# Patient Record
Sex: Female | Born: 1955 | Race: Black or African American | Hispanic: No | Smoking: Never smoker
Health system: Southern US, Community
[De-identification: ages and names within clinical notes are randomized; demographics above are authoritative.]

## PROBLEM LIST (undated history)

## (undated) DIAGNOSIS — E7800 Pure hypercholesterolemia, unspecified: Secondary | ICD-10-CM

## (undated) DIAGNOSIS — F32A Depression, unspecified: Secondary | ICD-10-CM

## (undated) DIAGNOSIS — G479 Sleep disorder, unspecified: Secondary | ICD-10-CM

## (undated) DIAGNOSIS — C50919 Malignant neoplasm of unspecified site of unspecified female breast: Secondary | ICD-10-CM

## (undated) DIAGNOSIS — F419 Anxiety disorder, unspecified: Secondary | ICD-10-CM

## (undated) DIAGNOSIS — I10 Essential (primary) hypertension: Secondary | ICD-10-CM

## (undated) DIAGNOSIS — IMO0002 Reserved for concepts with insufficient information to code with codable children: Secondary | ICD-10-CM

## (undated) DIAGNOSIS — IMO0001 Reserved for inherently not codable concepts without codable children: Secondary | ICD-10-CM

## (undated) DIAGNOSIS — M1990 Unspecified osteoarthritis, unspecified site: Secondary | ICD-10-CM

## (undated) DIAGNOSIS — F329 Major depressive disorder, single episode, unspecified: Secondary | ICD-10-CM

## (undated) DIAGNOSIS — K589 Irritable bowel syndrome without diarrhea: Secondary | ICD-10-CM

## (undated) HISTORY — DX: Pure hypercholesterolemia, unspecified: E78.00

## (undated) HISTORY — PX: DILATION AND CURETTAGE OF UTERUS: SHX78

## (undated) HISTORY — PX: COLONOSCOPY: SHX174

## (undated) HISTORY — DX: Irritable bowel syndrome, unspecified: K58.9

## (undated) HISTORY — DX: Reserved for inherently not codable concepts without codable children: IMO0001

## (undated) HISTORY — DX: Essential (primary) hypertension: I10

## (undated) HISTORY — PX: TONSILLECTOMY: SUR1361

## (undated) HISTORY — DX: Reserved for concepts with insufficient information to code with codable children: IMO0002

## (undated) HISTORY — PX: CYST EXCISION: SHX5701

---

## 1997-11-10 ENCOUNTER — Other Ambulatory Visit: Admission: RE | Admit: 1997-11-10 | Discharge: 1997-11-10 | Payer: Self-pay | Admitting: Obstetrics and Gynecology

## 1998-11-04 ENCOUNTER — Other Ambulatory Visit: Admission: RE | Admit: 1998-11-04 | Discharge: 1998-11-04 | Payer: Self-pay | Admitting: Obstetrics and Gynecology

## 1998-12-22 ENCOUNTER — Encounter (INDEPENDENT_AMBULATORY_CARE_PROVIDER_SITE_OTHER): Payer: Self-pay | Admitting: Specialist

## 1998-12-22 ENCOUNTER — Other Ambulatory Visit: Admission: RE | Admit: 1998-12-22 | Discharge: 1998-12-22 | Payer: Self-pay | Admitting: Obstetrics and Gynecology

## 1999-02-08 ENCOUNTER — Ambulatory Visit (HOSPITAL_COMMUNITY): Admission: RE | Admit: 1999-02-08 | Discharge: 1999-02-08 | Payer: Self-pay | Admitting: Internal Medicine

## 1999-02-08 ENCOUNTER — Encounter: Payer: Self-pay | Admitting: Internal Medicine

## 1999-06-24 ENCOUNTER — Encounter (INDEPENDENT_AMBULATORY_CARE_PROVIDER_SITE_OTHER): Payer: Self-pay | Admitting: *Deleted

## 1999-06-24 ENCOUNTER — Ambulatory Visit (HOSPITAL_BASED_OUTPATIENT_CLINIC_OR_DEPARTMENT_OTHER): Admission: RE | Admit: 1999-06-24 | Discharge: 1999-06-24 | Payer: Self-pay | Admitting: General Surgery

## 2000-01-26 ENCOUNTER — Other Ambulatory Visit: Admission: RE | Admit: 2000-01-26 | Discharge: 2000-01-26 | Payer: Self-pay | Admitting: Obstetrics and Gynecology

## 2001-04-16 ENCOUNTER — Other Ambulatory Visit: Admission: RE | Admit: 2001-04-16 | Discharge: 2001-04-16 | Payer: Self-pay | Admitting: Obstetrics and Gynecology

## 2003-05-18 ENCOUNTER — Other Ambulatory Visit: Admission: RE | Admit: 2003-05-18 | Discharge: 2003-05-18 | Payer: Self-pay | Admitting: Obstetrics and Gynecology

## 2006-03-27 ENCOUNTER — Encounter: Admission: RE | Admit: 2006-03-27 | Discharge: 2006-03-27 | Payer: Self-pay | Admitting: Orthopedic Surgery

## 2009-01-22 ENCOUNTER — Ambulatory Visit: Payer: Self-pay | Admitting: Internal Medicine

## 2009-01-22 DIAGNOSIS — R142 Eructation: Secondary | ICD-10-CM

## 2009-01-22 DIAGNOSIS — R141 Gas pain: Secondary | ICD-10-CM | POA: Insufficient documentation

## 2009-01-22 DIAGNOSIS — R143 Flatulence: Secondary | ICD-10-CM

## 2009-01-22 DIAGNOSIS — K5900 Constipation, unspecified: Secondary | ICD-10-CM | POA: Insufficient documentation

## 2009-02-04 ENCOUNTER — Telehealth: Payer: Self-pay | Admitting: Internal Medicine

## 2009-02-15 ENCOUNTER — Telehealth: Payer: Self-pay | Admitting: Internal Medicine

## 2009-02-16 ENCOUNTER — Ambulatory Visit: Payer: Self-pay | Admitting: Internal Medicine

## 2009-06-17 ENCOUNTER — Inpatient Hospital Stay (HOSPITAL_COMMUNITY): Admission: EM | Admit: 2009-06-17 | Discharge: 2009-06-20 | Payer: Self-pay | Admitting: Emergency Medicine

## 2009-09-22 ENCOUNTER — Encounter: Admission: RE | Admit: 2009-09-22 | Discharge: 2009-09-22 | Payer: Self-pay | Admitting: Family Medicine

## 2010-08-15 LAB — CATECHOLAMINES, FRACTIONATED, URINE, 24 HOUR
Catecholamines T: 65 mcg/24 h (ref 26–121)
Creatinine, Urine mg/day-CATEUR: 0.98 g/(24.h) (ref 0.63–2.50)
Norepinephrine 24 Hr Urine: 41 mcg/24 h (ref 15–100)
Total urine volume: 3000 mL

## 2010-08-15 LAB — DIFFERENTIAL
Eosinophils Relative: 2 % (ref 0–5)
Lymphocytes Relative: 22 % (ref 12–46)
Lymphs Abs: 1.5 10*3/uL (ref 0.7–4.0)
Neutrophils Relative %: 68 % (ref 43–77)

## 2010-08-15 LAB — URINALYSIS, ROUTINE W REFLEX MICROSCOPIC
Ketones, ur: NEGATIVE mg/dL
Nitrite: NEGATIVE
Specific Gravity, Urine: 1.015 (ref 1.005–1.030)
pH: 7.5 (ref 5.0–8.0)

## 2010-08-15 LAB — POCT CARDIAC MARKERS
Myoglobin, poc: 72.7 ng/mL (ref 12–200)
Troponin i, poc: <0.05 ng/mL (ref 0.00–0.09)

## 2010-08-15 LAB — VMA + CREATININE, URINE (TIMED COLLECTION)
Vanillylmandelic Acid, (VMA): 2.4 mg/24 h (ref <=6.0)
Volume, Urine-VMAUR: 3000 mL

## 2010-08-15 LAB — CBC
HCT: 40 % (ref 36.0–46.0)
Platelets: 381 10*3/uL (ref 150–400)
WBC: 6.8 10*3/uL (ref 4.0–10.5)

## 2010-08-15 LAB — BASIC METABOLIC PANEL
BUN: 12 mg/dL (ref 6–23)
BUN: 16 mg/dL (ref 6–23)
BUN: 8 mg/dL (ref 6–23)
CO2: 26 mEq/L (ref 19–32)
CO2: 29 mEq/L (ref 19–32)
Calcium: 9.3 mg/dL (ref 8.4–10.5)
Chloride: 102 mEq/L (ref 96–112)
Chloride: 102 mEq/L (ref 96–112)
Chloride: 104 mEq/L (ref 96–112)
Creatinine, Ser: 0.67 mg/dL (ref 0.4–1.2)
Creatinine, Ser: 0.74 mg/dL (ref 0.4–1.2)
GFR calc Af Amer: >60 mL/min (ref >60)
GFR calc non Af Amer: >60 mL/min (ref >60)
Glucose, Bld: 91 mg/dL (ref 70–99)
Potassium: 3.3 mEq/L — ABNORMAL LOW (ref 3.5–5.1)
Potassium: 3.6 mEq/L (ref 3.5–5.1)
Potassium: 3.7 mEq/L (ref 3.5–5.1)

## 2010-08-15 LAB — CK TOTAL AND CKMB (NOT AT ARMC)
CK, MB: 2 ng/mL (ref 0.3–4.0)
Total CK: 92 U/L (ref 7–177)

## 2010-08-15 LAB — ALDOSTERONE + RENIN ACTIVITY W/ RATIO: PRA LC/MS/MS: 0.05 ng/mL/h — ABNORMAL LOW (ref 0.25–5.82)

## 2010-08-15 LAB — TROPONIN I
Troponin I: 0.01 ng/mL (ref 0.00–0.06)
Troponin I: 0.03 ng/mL (ref 0.00–0.06)

## 2010-08-15 LAB — POCT PREGNANCY, URINE: Preg Test, Ur: NEGATIVE

## 2010-10-14 NOTE — Op Note (Signed)
[place] [facility]  Patient:    Mckenzie Sanford                       MRN: [id number] Proc. Date: 06/24/99 Adm. Date:  [id number] Attending:  Cherylynn Ridges                           Operative Report  PREOPERATIVE DIAGNOSIS:  Hidradenitis of right axilla.  POSTOPERATIVE DIAGNOSIS:  Hidradenitis of right axilla.  PROCEDURE:  Excision of right axillary hidradenitis.  SURGEON:  Jimmye Norman, M.D.  ASSISTANT:  None.  ANESTHESIA:  Monitored anesthesia care with 0.5% Xylocaine and 0.25% Marcaine mixture.  COMPLICATIONS:  None.  CONDITION:  Stable.  INDICATIONS FOR PROCEDURE:  The patient is a 55 year old female who had had a previous hidradenitis drained with multiple infections.  Now comes in for excision.  FINDINGS:  Indurated hidradenitis of localized area, right axilla and could be closed primarily.  DESCRIPTION OF PROCEDURE:  The patient was taken to the operating room and placed on the table in the supine position.  After an adequate amount of IV sedation was given, she was prepped and draped in the usual sterile manner with the right arm and ________ to the body and the right axilla.  The area to be excised was marked with a marking pen that included the indurated area with localized _________ inflammation.  There was no pus encountered.  It as injected with 0.5% Xylocaine and 0.25% Marcaine into the axillary area in an oval manner surrounding the previously excised site.  We incised the skin using a 15  blade in an oval manner, and then subsequently excised using electrocautery, taking care not to get into the indurated and infected area.  Hemostasis was obtained ith electrocautery.  Subsequently the skin was closed using horizontal mattress sutures of 4-0 Prolene and sterile dressing applied. DD:  06/24/99 TD:  06/26/99 Job: [id number]

## 2011-07-02 ENCOUNTER — Ambulatory Visit (INDEPENDENT_AMBULATORY_CARE_PROVIDER_SITE_OTHER): Payer: BC Managed Care – PPO | Admitting: Family Medicine

## 2011-07-02 VITALS — BP 114/71 | HR 61 | Temp 99.1°F | Resp 16 | Ht 62.5 in | Wt 135.0 lb

## 2011-07-02 DIAGNOSIS — E785 Hyperlipidemia, unspecified: Secondary | ICD-10-CM

## 2011-07-02 DIAGNOSIS — R05 Cough: Secondary | ICD-10-CM

## 2011-07-02 DIAGNOSIS — J069 Acute upper respiratory infection, unspecified: Secondary | ICD-10-CM

## 2011-07-02 DIAGNOSIS — R059 Cough, unspecified: Secondary | ICD-10-CM

## 2011-07-02 DIAGNOSIS — I10 Essential (primary) hypertension: Secondary | ICD-10-CM

## 2011-07-02 DIAGNOSIS — H66009 Acute suppurative otitis media without spontaneous rupture of ear drum, unspecified ear: Secondary | ICD-10-CM

## 2011-07-02 MED ORDER — HYDROCODONE-HOMATROPINE 5-1.5 MG/5ML PO SYRP: 5.0000 mL | ORAL_SOLUTION | Freq: Four times a day (QID) | ORAL | Status: AC | PRN

## 2011-07-02 MED ORDER — AZITHROMYCIN 250 MG PO TABS: ORAL_TABLET | ORAL | Status: AC

## 2011-07-02 NOTE — Progress Notes (Signed)
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 She denies fever or underlying lung disease. She's had no hemoptysis or abdominal symptoms  Objective: 56 year old African American woman appearing stated age in no acute distress she is alert and cooperative. HEENT shows appearing nasal discharge and a small yellow meniscus in the right ear. Neck is supple without adenopathy. Oropharynx is clear. Chest is shows rhonchi bilaterally.(1 patient has an acute sinus infection with bronchitis.  Plan: Keep patient on Monday to start Zithromax and Hydromet

## 2011-07-02 NOTE — Patient Instructions (Signed)
Bronchitis Bronchitis is the body's way of reacting to injury and/or infection (inflammation) of the bronchi. Bronchi are the air tubes that extend from the windpipe into the lungs. If the inflammation becomes severe, it may cause shortness of breath. CAUSES  Inflammation may be caused by:  A virus.   Germs (bacteria).   Dust.   Allergens.   Pollutants and many other irritants.  The cells lining the bronchial tree are covered with tiny hairs (cilia). These constantly beat upward, away from the lungs, toward the mouth. This keeps the lungs free of pollutants. When these cells become too irritated and are unable to do their job, mucus begins to develop. This causes the characteristic cough of bronchitis. The cough clears the lungs when the cilia are unable to do their job. Without either of these protective mechanisms, the mucus would settle in the lungs. Then you would develop pneumonia. Smoking is a common cause of bronchitis and can contribute to pneumonia. Stopping this habit is the single most important thing you can do to help yourself. TREATMENT   Your caregiver may prescribe an antibiotic if the cough is caused by bacteria. Also, medicines that open up your airways make it easier to breathe. Your caregiver may also recommend or prescribe an expectorant. It will loosen the mucus to be coughed up. Only take over-the-counter or prescription medicines for pain, discomfort, or fever as directed by your caregiver.   Removing whatever causes the problem (smoking, for example) is critical to preventing the problem from getting worse.   Cough suppressants may be prescribed for relief of cough symptoms.   Inhaled medicines may be prescribed to help with symptoms now and to help prevent problems from returning.   For those with recurrent (chronic) bronchitis, there may be a need for steroid medicines.  SEEK IMMEDIATE MEDICAL CARE IF:   During treatment, you develop more pus-like mucus  (purulent sputum).   You have a fever.   Your baby is older than 3 months with a rectal temperature of 102 F (38.9 C) or higher.   Your baby is 3 months old or younger with a rectal temperature of 100.4 F (38 C) or higher.   You become progressively more ill.   You have increased difficulty breathing, wheezing, or shortness of breath.  It is necessary to seek immediate medical care if you are elderly or sick from any other disease. MAKE SURE YOU:   Understand these instructions.   Will watch your condition.   Will get help right away if you are not doing well or get worse.  Document Released: 05/15/2005 Document Revised: 01/25/2011 Document Reviewed: 03/24/2008 ExitCare Patient Information 2012 [employer].Sinusitis Sinuses are air pockets within the bones of your face. The growth of bacteria within a sinus leads to infection. The infection prevents the sinuses from draining. This infection is called sinusitis. SYMPTOMS  There will be different areas of pain depending on which sinuses have become infected.  The maxillary sinuses often produce pain beneath the eyes.   Frontal sinusitis may cause pain in the middle of the forehead and above the eyes.  Other problems (symptoms) include:  Toothaches.   Colored, pus-like (purulent) drainage from the nose.   Swelling, warmth, and tenderness over the sinus areas may be signs of infection.  TREATMENT  Sinusitis is most often determined by an exam.X-rays may be taken. If x-rays have been taken, make sure you obtain your results or find out how you are to obtain them. Your caregiver   may give you medications (antibiotics). These are medications that will help kill the bacteria causing the infection. You may also be given a medication (decongestant) that helps to reduce sinus swelling.  HOME CARE INSTRUCTIONS   Only take over-the-counter or prescription medicines for pain, discomfort, or fever as directed by your caregiver.    Drink extra fluids. Fluids help thin the mucus so your sinuses can drain more easily.   Applying either moist heat or ice packs to the sinus areas may help relieve discomfort.   Use saline nasal sprays to help moisten your sinuses. The sprays can be found at your local drugstore.  SEEK IMMEDIATE MEDICAL CARE IF:  You have a fever.   You have increasing pain, severe headaches, or toothache.   You have nausea, vomiting, or drowsiness.   You develop unusual swelling around the face or trouble seeing.  MAKE SURE YOU:   Understand these instructions.   Will watch your condition.   Will get help right away if you are not doing well or get worse.  Document Released: 05/15/2005 Document Revised: 01/25/2011 Document Reviewed: 12/12/2006 ExitCare Patient Information 2012 [employer]. 

## 2011-12-11 ENCOUNTER — Ambulatory Visit: Payer: BC Managed Care – PPO

## 2011-12-11 ENCOUNTER — Ambulatory Visit (INDEPENDENT_AMBULATORY_CARE_PROVIDER_SITE_OTHER): Payer: BC Managed Care – PPO | Admitting: Family Medicine

## 2011-12-11 VITALS — BP 140/75 | HR 52 | Temp 97.6°F | Resp 16 | Ht 63.0 in | Wt 134.0 lb

## 2011-12-11 DIAGNOSIS — M549 Dorsalgia, unspecified: Secondary | ICD-10-CM

## 2011-12-11 DIAGNOSIS — R319 Hematuria, unspecified: Secondary | ICD-10-CM

## 2011-12-11 DIAGNOSIS — R102 Pelvic and perineal pain: Secondary | ICD-10-CM

## 2011-12-11 DIAGNOSIS — N949 Unspecified condition associated with female genital organs and menstrual cycle: Secondary | ICD-10-CM

## 2011-12-11 DIAGNOSIS — R109 Unspecified abdominal pain: Secondary | ICD-10-CM

## 2011-12-11 DIAGNOSIS — R300 Dysuria: Secondary | ICD-10-CM

## 2011-12-11 DIAGNOSIS — R635 Abnormal weight gain: Secondary | ICD-10-CM

## 2011-12-11 LAB — POCT WET PREP WITH KOH: Trichomonas, UA: NEGATIVE

## 2011-12-11 LAB — POCT CBC
Lymph, poc: 2.1 (ref 0.6–3.4)
MCH, POC: 29.2 pg (ref 27–31.2)
MCHC: 30.9 g/dL — AB (ref 31.8–35.4)
MID (cbc): 0.6 (ref 0–0.9)
MPV: 8.2 fL (ref 0–99.8)
POC Granulocyte: 3.4 (ref 2–6.9)
POC MID %: 10 %M (ref 0–12)
Platelet Count, POC: 405 10*3/uL (ref 142–424)
WBC: 6.1 10*3/uL (ref 4.6–10.2)

## 2011-12-11 LAB — COMPREHENSIVE METABOLIC PANEL
ALT: 39 U/L — ABNORMAL HIGH (ref 0–35)
AST: 28 U/L (ref 0–37)
Alkaline Phosphatase: 90 U/L (ref 39–117)
Creat: 0.72 mg/dL (ref 0.50–1.10)
Sodium: 137 mEq/L (ref 135–145)
Total Bilirubin: 0.3 mg/dL (ref 0.3–1.2)

## 2011-12-11 LAB — POCT URINALYSIS DIPSTICK
Bilirubin, UA: NEGATIVE
Glucose, UA: NEGATIVE
Nitrite, UA: NEGATIVE
pH, UA: 6

## 2011-12-11 LAB — POCT UA - MICROSCOPIC ONLY
Casts, Ur, LPF, POC: NEGATIVE
Mucus, UA: POSITIVE

## 2011-12-11 LAB — IFOBT (OCCULT BLOOD): IFOBT: NEGATIVE

## 2011-12-11 MED ORDER — NITROFURANTOIN MONOHYD MACRO 100 MG PO CAPS: 100.0000 mg | ORAL_CAPSULE | Freq: Two times a day (BID) | ORAL | Status: AC

## 2011-12-11 NOTE — Progress Notes (Addendum)
Date:  12/11/2011   Name:  Mckenzie Sanford   DOB:  01/14/1956   MRN:  [id number]  PCP:  No primary provider on file.    Chief Complaint: Bloated, Back Pain, Abdominal Pain and Foot Pain   History of Present Illness:  Mckenzie Sanford is a 56 y.o. very pleasant female patient who presents with the following:  Here today with several complaints.   Her "stomach swells" every time she eats or drinks.  (She also has abdominal pain after eating for 2 weeks.)  The bloating has gone on for 4 or 5 months.  She does note early satiety as well.  She tried miralax but this did not help.  She did have a colonoscopy when she turned 63- it looked ok, done by Dr. Marina Goodell.  She does not have any vomiting.  She notes that she has loose stools sometimes but this might be due to miralax use. she uses miralax BID and has done so for several months. When she has stomach pains she has to have a BM.    She has gained weight.  She has gained about 20 lbs over the past year or two.   Last week she noted foot swelling which lasted about 5 days. It would improve overnight but not go away completly.  This is now better.    She also notes pelvic pain for about 3 weeks.   She also sometimes notes dysuria.  No hematuria.   She also notes pains in her left leg which comes and goes over the last year.    LMP in April- her menses are starting to space out, but they still last for 7 days.    Patient Active Problem List  Diagnosis  . CONSTIPATION  . FLATULENCE-GAS-BLOATING  . Hypertension  . Hyperlipidemia    No past medical history on file.  No past surgical history on file.  History  Substance Use Topics  . Smoking status: Never Smoker   . Smokeless tobacco: Not on file  . Alcohol Use: Not on file    No family history on file.  Allergies  Allergen Reactions  . Shellfish Allergy Swelling  . Ivp Dye (Iodinated Diagnostic Agents)   . Penicillins     Medication list has been reviewed and  updated.  Current Outpatient Prescriptions on File Prior to Visit  Medication Sig Dispense Refill  . amLODipine (NORVASC) 10 MG tablet Take 10 mg by mouth daily.      . metoprolol succinate (TOPROL-XL) 50 MG 24 hr tablet Take 50 mg by mouth daily. Take with or immediately following a meal.      . simvastatin (ZOCOR) 40 MG tablet Take 40 mg by mouth every evening.      Marland Kitchen spironolactone (ALDACTONE) 25 MG tablet Take 25 mg by mouth 2 (two) times daily.      . traMADol (ULTRAM) 50 MG tablet Take 50 mg by mouth every 6 (six) hours as needed.      . valsartan (DIOVAN) 160 MG tablet Take 160 mg by mouth daily.      . cetirizine (ZYRTEC) 10 MG tablet Take 10 mg by mouth daily. Takes daily prn      . fluticasone (FLONASE) 50 MCG/ACT nasal spray Place 2 sprays into the nose daily.      Marland Kitchen loratadine (CLARITIN) 10 MG tablet Take 10 mg by mouth daily. Takes either loratidine or zyrtec with nasal spray prn      . predniSONE (DELTASONE) 10  MG tablet Take 10 mg by mouth daily. Takes prn for carpal tunnel-has 3 pills left        Review of Systems:  As per HPI- otherwise negative.   Physical Examination: Filed Vitals:   12/11/11 1116  BP: 140/75  Pulse: 52  Temp: 97.6 F (36.4 C)  Resp: 16   Filed Vitals:   12/11/11 1116  Height: 5\' 3"  (1.6 m)  Weight: 134 lb (60.782 kg)   Body mass index is 23.74 kg/(m^2). Ideal Body Weight: Weight in (lb) to have BMI = 25: 140.8   GEN: WDWN, NAD, Non-toxic, A & O x 3 HEENT: Atraumatic, Normocephalic. Neck supple. No masses, No LAD.  TM and oropharynx wnl, PEERL Ears and Nose: No external deformity. CV: RRR, No M/G/R. No JVD. No thrill. No extra heart sounds. PULM: CTA B, no wheezes, crackles, rhonchi. No retractions. No resp. distress. No accessory muscle use. ABD: S, ND.  She has diffuse tenderness to palpation over entire abdomen, but no guarding.  Normal BS.   EXTR: No c/c/e.  Feet appear normal at this time.  NEURO Normal gait.  PSYCH: Normally  interactive. Conversant. Not depressed or anxious appearing.  Calm demeanor.  GU: normal external genitals, some normal appearing white discharge in vagina.  Bimanual exam wnl, no CMT. She denies any risk of STI and declined a genprobe today.   Results for orders placed in visit on 12/11/11  POCT URINALYSIS DIPSTICK      Component Value Range   Color, UA yellow     Clarity, UA cloudy     Glucose, UA neg     Bilirubin, UA neg     Ketones, UA trace     Spec Grav, UA >=1.030     Blood, UA large     pH, UA 6.0     Protein, UA >=300     Urobilinogen, UA 0.2     Nitrite, UA neg     Leukocytes, UA Trace    POCT UA - MICROSCOPIC ONLY      Component Value Range   WBC, Ur, HPF, POC 8-10     RBC, urine, microscopic TNTC     Bacteria, U Microscopic 2+     Mucus, UA positive     Epithelial cells, urine per micros 2-5     Crystals, Ur, HPF, POC neg     Casts, Ur, LPF, POC neg     Yeast, UA neg    POCT URINE PREGNANCY      Component Value Range   Preg Test, Ur Negative    POCT WET PREP WITH KOH      Component Value Range   Trichomonas, UA Negative     Clue Cells Wet Prep HPF POC 4-8     Epithelial Wet Prep HPF POC 4-8     Yeast Wet Prep HPF POC neg     Bacteria Wet Prep HPF POC 4+     RBC Wet Prep HPF POC neg     WBC Wet Prep HPF POC 0-2     KOH Prep POC Negative    POCT CBC      Component Value Range   WBC 6.1  4.6 - 10.2 K/uL   Lymph, poc 2.1  0.6 - 3.4   POC LYMPH PERCENT 34.5  10 - 50 %L   MID (cbc) 0.6  0 - 0.9   POC MID % 10.0  0 - 12 %M   POC Granulocyte 3.4  2 -  6.9   Granulocyte percent 55.5  37 - 80 %G   RBC 4.79  4.04 - 5.48 M/uL   Hemoglobin 14.0  12.2 - 16.2 g/dL   HCT, POC 40.9  81.1 - 47.9 %   MCV 94.6  80 - 97 fL   MCH, POC 29.2  27 - 31.2 pg   MCHC 30.9 (*) 31.8 - 35.4 g/dL   RDW, POC 91.4     Platelet Count, POC 405  142 - 424 K/uL   MPV 8.2  0 - 99.8 fL  IFOBT (OCCULT BLOOD)      Component Value Range   IFOBT Negative     [facility] reading (PRIMARY) by   Dr. Patsy Lager.  Abdominal series: increased stool burden, otherwise normal  ABDOMEN - 2 VIEW  Comparison: None.  Findings: Moderate stool burden throughout the colon. There is normal bowel gas pattern. No free air. No organomegaly or suspicious calcification. No acute bony abnormality. Mild levoscoliosis in the lumbar spine.  IMPRESSION: No obstruction or free air. Moderate stool burden.  Assessment and Plan: 1. Back pain  POCT urinalysis dipstick, POCT UA - Microscopic Only  2. Burning with urination  POCT urinalysis dipstick, POCT UA - Microscopic Only, Urine culture, nitrofurantoin, macrocrystal-monohydrate, (MACROBID) 100 MG capsule  3. Abdominal  pain, other specified site  POCT CBC, Comprehensive metabolic panel, Amylase, Lipase, IFOBT POC (occult bld, rslt in office), DG Abd 2 Views  4. Pelvic pain in female  POCT urine pregnancy, POCT Wet Prep with KOH  5. Weight gain  TSH   Mckenzie Sanford has several issues today.  Her abdominal pain is likely due to her UTI and constipation.  Went over more serious etiologies such as appendicitis, but she has had pain for 2 weeks and has a normal WBC count, so this is much less likely.  Will try treating her for a UTI, and will have her increase her miralax to 3 doses a day, and add prunes to her diet.  Will await other labs as above and arrange an abdominal ultrasound in the next few days.  If she starts to feel worse or if her symtoms change please call.   Pelvic pain: likely due to UTI and constipation as above.  Also consider BV, but this is unlikely to cause her this sort of pain.  Discussed this with her, but will defer treatment as flagyl likely to increase her abdominal discomfort.   Ankle and foot swelling: this is now resolved.  Will check her renal function. Also consider norvasc as cause.  Weight gain: check TSH.  Leean Amezcua, MD  Was able to reach her by phone.  She is feeling better- her constipation is a bit better.  She actually saw  her PCP yesterday for a recheck.  She has received a call about her ultrasound but has not yet returned the call.  Encouraged her to do this so she can get an appt.  Also went over her negative urine culture.  This leaves her microscopic hematuria unexplained.  Asked her to RTC for a recheck UA in the next week or so- she agreed to do this.  She will let me know if she is getting worse or if her pain returns.  addnd 12/22/11  ABDOMINAL ULTRASOUND COMPLETE  Comparison: 09/22/2009  Findings:  Gallbladder: No gallstones, gallbladder wall thickening, or pericholecystic fluid.  Common Bile Duct: Within normal limits in caliber. Measures 3 mm in diameter.  Liver: No focal mass lesion identified. Within normal limits in  parenchymal echogenicity.  IVC: Appears normal.  Pancreas: No abnormality identified.  Spleen: Within normal limits in size and echotexture.  Right kidney: Normal in size and parenchymal echogenicity. No evidence of mass or hydronephrosis.  Left kidney: Normal in size and parenchymal echogenicity. No evidence of mass or hydronephrosis.  Abdominal Aorta: No aneurysm identified.  IMPRESSION: Negative abdominal ultrasound.   Called hero to go over her normal abdominal ultrasound.  Reminded her to be sure and have her urine rechecked in the next week or so.   Order on her chart to do this at her convenience.  If her repeat urine is negative will need to look further if her pains continue.  Currently she has pains only "on occasion."

## 2011-12-12 LAB — TSH: TSH: 1.165 u[IU]/mL (ref 0.350–4.500)

## 2011-12-13 LAB — URINE CULTURE: Organism ID, Bacteria: NO GROWTH

## 2011-12-15 NOTE — Addendum Note (Signed)
Addended by: Abbe Amsterdam C on: 12/15/2011 09:59 PM   Modules accepted: Orders

## 2011-12-22 ENCOUNTER — Ambulatory Visit
Admission: RE | Admit: 2011-12-22 | Discharge: 2011-12-22 | Disposition: A | Discharge status: Home / Self Care | Payer: BC Managed Care – PPO | Source: Ambulatory Visit | Attending: Family Medicine | Admitting: Family Medicine

## 2011-12-22 DIAGNOSIS — R109 Unspecified abdominal pain: Secondary | ICD-10-CM

## 2013-03-25 ENCOUNTER — Encounter: Payer: Self-pay | Admitting: *Deleted

## 2013-03-25 ENCOUNTER — Encounter: Payer: Self-pay | Admitting: Cardiology

## 2013-03-26 ENCOUNTER — Ambulatory Visit (INDEPENDENT_AMBULATORY_CARE_PROVIDER_SITE_OTHER): Payer: BC Managed Care – PPO | Admitting: Cardiology

## 2013-03-26 ENCOUNTER — Encounter: Payer: Self-pay | Admitting: Cardiology

## 2013-03-26 ENCOUNTER — Encounter (INDEPENDENT_AMBULATORY_CARE_PROVIDER_SITE_OTHER): Payer: Self-pay

## 2013-03-26 VITALS — BP 126/74 | HR 87 | Ht 62.0 in | Wt 129.0 lb

## 2013-03-26 DIAGNOSIS — I10 Essential (primary) hypertension: Secondary | ICD-10-CM

## 2013-03-26 DIAGNOSIS — R002 Palpitations: Secondary | ICD-10-CM

## 2013-03-26 DIAGNOSIS — E785 Hyperlipidemia, unspecified: Secondary | ICD-10-CM

## 2013-03-26 NOTE — Progress Notes (Signed)
      [address] [place], [place]  [address] Phone: [phone] Fax:  [phone]  Date:  03/26/2013   ID:  NIKKITA ADEYEMI, DOB [birth year], MRN [id number]  PCP:  No primary provider on file.   History of Present Illness: Mckenzie Sanford is a 57 y.o. female with palpitations, hyperlipidemia, hypertension here for followup. She hypertension after dental cleaning with blood pressures in the 200s at one point. Went to the emergency room. She and exercise treadmill test in 11/09 which overall was low risk. She also had an echocardiogram at that time which showed normal ejection fraction.  Feels good, no symptoms, no palpitations currently. Blood pressure under excellent control. She's been able to  Of some of her medications.   Wt Readings from Last 3 Encounters:  03/26/13 129 lb (58.514 kg)  12/11/11 134 lb (60.782 kg)  07/02/11 135 lb (61.236 kg)     Past Medical History  Diagnosis Date  . HTN (hypertension)   . Allergic rhinitis   . Chest pain      ETT 11/09 - low risk (R wave amplitude increased) note echo above  . Hypercholesteremia   . Palpitations      echocardiogram 2006-normal EF, trace TR  . Palpitations 03/26/2013    No past surgical history on file.  Current Outpatient Prescriptions  Medication Sig Dispense Refill  . amLODipine (NORVASC) 10 MG tablet Take 10 mg by mouth daily.      . IRBESARTAN PO Take by mouth daily.      . metoprolol succinate (TOPROL-XL) 50 MG 24 hr tablet Take 50 mg by mouth daily. Take with or immediately following a meal.      . simvastatin (ZOCOR) 40 MG tablet Take 40 mg by mouth every evening.       No current facility-administered medications for this visit.    Allergies:    Allergies  Allergen Reactions  . Shellfish Allergy Swelling  . Ivp Dye [Iodinated Diagnostic Agents]   . Penicillins     Social History:  The patient  reports that she has never smoked. She does not have any smokeless tobacco history  on file.   ROS:  Please see the history of present illness.   No syncope, no bleeding, no chest pain, no shortness of breath.  PHYSICAL EXAM: VS:  BP 126/74  Pulse 87  Ht 5\' 2"  (1.575 m)  Wt 129 lb (58.514 kg)  BMI 23.59 kg/m2 Well nourished, well developed, in no acute distress HEENT: normal Neck: no JVD Cardiac:  normal S1, S2; RRR; no murmur Lungs:  clear to auscultation bilaterally, no wheezing, rhonchi or rales Abd: soft, nontender, no hepatomegaly Ext: no edema Skin: warm and dry Neuro: no focal abnormalities noted  EKG:  Sinus bradycardia rate 56, nonspecific T-wave change in aVF otherwise normal.  ASSESSMENT AND PLAN:  1. Palpitations-very well controlled with metoprolol. No changes 2. Hypertension-she is actually been able to come back off of some of her medications. No longer on Aldactone. Doing well. Dr. Felipa Eth. 3. See her back in one year 4. Hyperlipidemia-continue with statin. Doing well.  Signed, Donato Schultz, MD [facility]  03/26/2013 3:05 PM

## 2013-03-26 NOTE — Patient Instructions (Signed)
Your physician recommends that you continue on your current medications as directed. Please refer to the Current Medication list given to you today.  Your physician wants you to follow-up in: 1 year with Dr. Skains. You will receive a reminder letter in the mail two months in advance. If you don't receive a letter, please call our office to schedule the follow-up appointment.  

## 2014-01-26 ENCOUNTER — Ambulatory Visit (INDEPENDENT_AMBULATORY_CARE_PROVIDER_SITE_OTHER): Payer: 59 | Admitting: Podiatry

## 2014-01-26 ENCOUNTER — Encounter: Payer: Self-pay | Admitting: Podiatry

## 2014-01-26 VITALS — BP 133/75 | HR 61 | Resp 14 | Ht 62.0 in | Wt 127.0 lb

## 2014-01-26 DIAGNOSIS — M2040 Other hammer toe(s) (acquired), unspecified foot: Secondary | ICD-10-CM

## 2014-01-26 NOTE — Patient Instructions (Signed)
Hammer Toes Hammer toes is a condition in which one or more of your toes is permanently flexed. CAUSES  This happens when a muscle imbalance or abnormal bone length makes your small toes buckle. This causes the toe joint to contract and the strong cord-like bands that attach muscles to the bones (tendons) in your toes to shorten.  SIGNS AND SYMPTOMS  Common symptoms of flexible hammer toes include:   A buildup of skin cells (corns). Corns occur where boney bumps come in frequent contact with hard surfaces. For example, where your shoes press and rub.  Irritation.  Inflammation.  Pain.  Limited motion in your toes. DIAGNOSIS  Hammer toes are diagnosed through a physical exam of your toes. During the exam, your health care provider may try to reproduce your symptoms by manipulating your foot. Often, X-ray exams are done to determine the degree of deformity and to make sure that the cause is not a fracture.  TREATMENT  Hammer toes can be treated with corrective surgery. There are several types of surgical procedures that can treat hammer toes. The most common procedures include:  Arthroplasty--A portion of the joint is surgically removed and your toe is straightened. The gap fills in with fibrous tissue. This procedure helps treat pain and deformity and helps restore function.  Fusion--Cartilage between the two bones of the affected joint is taken out and the bones fuse together into one longer bone. This helps keep your toe stable and reduces pain but leaves your toe stiff, yet straight.  Implantation--A portion of your bone is removed and replaced with an implant to restore motion.  Flexor tendon transfers--This procedure repositions the tendons that curl the toes down (flexor tendons). This may be done to release the deforming force that causes your toe to buckle. Several of these procedures require fixing your toe with a pin that is visible at the tip of your toe. The pin keeps the toe  straight during healing. Your health care provider will remove the pin usually within 4-8 weeks after the procedure.  Document Released: 05/12/2000 Document Revised: 05/20/2013 Document Reviewed: 01/20/2013 ExitCare Patient Information 2015 [employer]. This information is not intended to replace advice given to you by your health care provider. Make sure you discuss any questions you have with your health care provider.  

## 2014-01-26 NOTE — Progress Notes (Signed)
   Subjective:    Patient ID: Mckenzie Sanford, female    DOB: August 18, 1955, 58 y.o.   MRN: [id number]  HPI Comments: N corns L B/L 5th toes D 8 years O C hard skin A walking over [place]  T OTC corn treatment  Patient denies any pain in the fifth toes or the inability to tolerate cold shoes.   Review of Systems  All other systems reviewed and are negative.      Objective:   Physical Exam  Orientated x3 black female  Vascular: DP and PT pulses 2/4 bilaterally  Neurological: Sensation to 10 g monofilament wire intact 5/5 bilaterally Vibratory sensation intact bilaterally Ankle reflex equal and reactive bilaterally  Dermatological: Fibrous reactive tissue noted over the proximal interphalangeal joints fifth digits, bilaterally  Musculoskeletal: Adductovarus position stiff toes bilaterally The fifth toes are flexible at the proximal and distal interphalangeal joints bilaterally Low medial longitudinal arch bilaterally        Assessment & Plan:   Assessment: 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 I recommended at this time because she has minimal symptoms just to continue wearing wide toe box shoes and minimal wearing of high heel shoes. I suggested that if the fifth toes became more painful in nature wearing uncomfortable on a continuous basis to return for further evaluation.

## 2014-01-27 ENCOUNTER — Encounter: Payer: Self-pay | Admitting: Podiatry

## 2014-03-07 ENCOUNTER — Encounter: Payer: Self-pay | Admitting: *Deleted

## 2014-03-12 ENCOUNTER — Other Ambulatory Visit: Payer: Self-pay | Admitting: Radiology

## 2014-03-12 DIAGNOSIS — C50919 Malignant neoplasm of unspecified site of unspecified female breast: Secondary | ICD-10-CM

## 2014-03-12 HISTORY — DX: Malignant neoplasm of unspecified site of unspecified female breast: C50.919

## 2014-03-13 ENCOUNTER — Other Ambulatory Visit: Payer: Self-pay | Admitting: Radiology

## 2014-03-13 DIAGNOSIS — C50911 Malignant neoplasm of unspecified site of right female breast: Secondary | ICD-10-CM

## 2014-03-17 ENCOUNTER — Encounter (INDEPENDENT_AMBULATORY_CARE_PROVIDER_SITE_OTHER): Payer: Self-pay | Admitting: General Surgery

## 2014-03-17 NOTE — Progress Notes (Signed)
Patient ID: [name], female   DOB: September 17, 1955, 58 y.o.   MRN: [id number]  Ladiamond L. Saks 03/17/2014 11:02 AM Location: [facility] Patient #: [id number] DOB: 1955/08/25 Single / Language: Cleophus Molt / Race: Black or African American Female History of Present Illness Odis Hollingshead MD; 03/17/2014 3:44 PM) Patient words: eval breast.  The patient is a 58 year old female    Note:She is referred by Dr. 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 2 weeks ago, she felt an abnormality beneath her nipple. Mammogram demonstrates a 1 cm lesion at the 11:00 position. This was verified by ultrasound. An enlarged lymph node was also noted in the axilla. 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 Prognostic profile is pending. MRI has been scheduled for 03/20/2014. Lymph node biopsy was negative for malignancy. She is referred here for further evaluation and treatment. No family history of breast cancer. Age at menarche was 30. No pregnancies. Age at menopause was between 69 and 77. No nipple discharge.  Allergies (Sonya Bynum, CMA; 03/17/2014 11:04 AM) Penicillamine *ASSORTED CLASSES*  Medication History (Sonya Bynum, CMA; 03/17/2014 11:03 AM) AmLODIPine Besylate (10MG  Tablet, Oral daily) Active. Metoprolol Succinate ER (50MG  Tablet ER 24HR, Oral daily) Active. Sertraline HCl (25MG  Tablet, Oral daily) Active. Simvastatin (40MG  Tablet, Oral daily) Active. Linzess (290MCG Capsule, Oral daily) Active. Irbesartan (150MG  Tablet, Oral daily) Active.    Vitals (Sonya Bynum CMA; 03/17/2014 11:05 AM) 03/17/2014 11:04 AM Weight: 132 lb Height: 62in Body Surface Area: 1.62 m Body Mass Index: 24.14 kg/m Temp.: 74F(Temporal)  Pulse: 74 (Regular)  BP: 124/80 (Sitting, Left Arm, Standard)     Physical Exam Odis Hollingshead MD;  03/17/2014 3:45 PM)  The physical exam findings are as follows: Note:General: WDWN in NAD. Pleasant and cooperative.  HEENT: [place]/AT, no facial masses  EYES: EOMI, no icterus  NECK: Supple, no obvious mass or thyroid enlargement.  CV: RRR, no murmur, no JVD.  CHEST: Breath sounds equal and clear. Respirations nonlabored.  BREASTS: Symmetrical in size. No dominant left breast masses. There is a small wound inferior to the nipple in the right breast. There is an area of firmness deep to the lateral aspect of the nipple areolar complex.  ABDOMEN: Soft, nontender, nondistended, no masses, no organomegaly  MUSCULOSKELETAL: FROM, good muscle tone, no edema, no venous stasis changes  LYMPHATIC: No palpable cervical, supraclavicular, axillary adenopathy.  SKIN: No jaundice or suspicious rashes.  NEUROLOGIC: Alert and oriented, answers questions appropriately, normal gait and station.  PSYCHIATRIC: Normal mood, affect , and behavior.    Assessment & Plan Odis Hollingshead MD; 03/17/2014 3:45 PM)  MALIGNANT NEOPLASM OF UPPER-OUTER QUADRANT OF LEFT FEMALE BREAST (174.4  C50.412) Impression: We discussed breast conservation therapy versus mastectomy. We discussed the need for right axillary sentinel lymph node biopsy. She is interested in lumpectomy versus mastectomy. MRI has been scheduled for 3 days from now.  Plan: Referral to medical oncology and radiation oncology. Review of MRI results. Discussed at multidisciplinary breast cancer conference next week. After all this has been done, we'll then speak with her about scheduling surgery. I did discuss the procedures and risks with her. We discussed the possibility of needing a axillary lymph node dissection. Risks of the surgery including but not limited to bleeding, infection, wound healing problems, anesthesia, nerve damage with permanent weakness or numbness, cosmetic deformity, chronic pain, seroma  formation, need for more surgery. She  seems to understand all of this.  [name], M.D.

## 2014-03-18 ENCOUNTER — Telehealth: Payer: Self-pay | Admitting: *Deleted

## 2014-03-18 NOTE — Telephone Encounter (Signed)
Called and confirmed 03/20/14 appt w/ pts mom.  Emailed Engineer, civil (consulting) at [employer] to make her aware.  Gave paperwork to Dr. [name] and took a copy to HIM to scan.  Added to spreadsheet.

## 2014-03-20 ENCOUNTER — Encounter: Payer: Self-pay | Admitting: Hematology and Oncology

## 2014-03-20 ENCOUNTER — Ambulatory Visit (HOSPITAL_BASED_OUTPATIENT_CLINIC_OR_DEPARTMENT_OTHER): Payer: 59 | Admitting: Hematology and Oncology

## 2014-03-20 ENCOUNTER — Ambulatory Visit: Payer: 59

## 2014-03-20 ENCOUNTER — Ambulatory Visit
Admission: RE | Admit: 2014-03-20 | Discharge: 2014-03-20 | Disposition: A | Discharge status: Home / Self Care | Payer: 59 | Source: Ambulatory Visit | Attending: Radiology | Admitting: Radiology

## 2014-03-20 VITALS — BP 133/73 | HR 72 | Temp 99.1°F | Resp 18 | Ht 62.0 in | Wt 134.0 lb

## 2014-03-20 DIAGNOSIS — Z170 Estrogen receptor positive status [ER+]: Secondary | ICD-10-CM

## 2014-03-20 DIAGNOSIS — C50911 Malignant neoplasm of unspecified site of right female breast: Secondary | ICD-10-CM

## 2014-03-20 DIAGNOSIS — C50411 Malignant neoplasm of upper-outer quadrant of right female breast: Secondary | ICD-10-CM | POA: Insufficient documentation

## 2014-03-20 MED ORDER — GADOBENATE DIMEGLUMINE 529 MG/ML IV SOLN
12.0000 mL | Freq: Once | INTRAVENOUS | Status: AC | PRN
  Administered 2014-03-20: 12 mL via INTRAVENOUS

## 2014-03-20 NOTE — Progress Notes (Signed)
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 The mass was a 1 cm in size. She saw Dr. [name] with general surgery with the plan to do a lumpectomy. She underwent a breast MRI today and we do not know the results yet.  I reviewed her records extensively and collaborated the history with the patient. MEDICAL HISTORY:  Past Medical History  Diagnosis Date  . HTN (hypertension)   . Allergic rhinitis   . Chest pain      ETT 11/09 - low risk (R wave amplitude increased) note echo above  . Hypercholesteremia   . Palpitations      echocardiogram 2006-normal EF, trace TR  . Palpitations 03/26/2013  . IBS (irritable bowel syndrome)     SURGICAL HISTORY: History reviewed. No pertinent past surgical history.  SOCIAL HISTORY: History   Social History  . Marital Status: Divorced    Spouse Name: N/A    Number of Children: N/A  . Years of Education: N/A   Occupational History  . Not on file.   Social History Main Topics  . Smoking status: Never Smoker   . Smokeless tobacco: Never Used  . Alcohol Use: Yes  . Drug Use: No  . Sexual Activity: No   Other Topics Concern  . Not on file   Social History Narrative  . No narrative on file    FAMILY HISTORY: Family History  Problem Relation Age of Onset  . Heart attack Father     ALLERGIES:  is allergic to shellfish allergy; ivp dye; and penicillins.  MEDICATIONS:  Current Outpatient  Prescriptions  Medication Sig Dispense Refill  . amLODipine (NORVASC) 10 MG tablet Take 10 mg by mouth daily.      . Biotin 1000 MCG tablet Take 1,000 mcg by mouth 3 (three) times daily.      . cholecalciferol (VITAMIN D) 1000 UNITS tablet Take 1,000 Units by mouth daily.      . COD LIVER OIL PO Take by mouth.      Marland Kitchen ibuprofen (ADVIL,MOTRIN) 200 MG tablet Take 400 mg by mouth every 6 (six) hours as needed.      . IRBESARTAN PO Take by mouth daily.      . Linaclotide (LINZESS PO) Take by mouth.      . metoprolol succinate (TOPROL-XL) 50 MG 24 hr tablet Take 50 mg by mouth daily. Take with or immediately following a meal.      . Multiple Vitamin (MULTIVITAMIN) tablet Take 1 tablet by mouth daily.      . Sertraline HCl (ZOLOFT PO) Take by mouth.      . simvastatin (ZOCOR) 40 MG tablet Take 40 mg by mouth every evening.       No current facility-administered medications for this visit.    REVIEW OF SYSTEMS:   Constitutional: Denies fevers, chills or abnormal night sweats Eyes: Denies blurriness of vision, double vision or watery eyes Ears, nose, mouth, throat, and face: Denies mucositis or sore throat Respiratory: Denies  cough, dyspnea or wheezes Cardiovascular: Denies palpitation, chest discomfort or lower extremity swelling Gastrointestinal:  Denies nausea, heartburn or change in bowel habits Skin: Denies abnormal skin rashes Lymphatics: Denies new lymphadenopathy or easy bruising Neurological:Denies numbness, tingling or new weaknesses Behavioral/Psych: Mood is stable, no new changes  Breast: Palpable lump in the right breast All other systems were reviewed with the patient and are negative.  PHYSICAL EXAMINATION: ECOG PERFORMANCE STATUS: 0 - Asymptomatic  Filed Vitals:   03/20/14 1350  BP: 133/73  Pulse: 72  Temp: 99.1 F (37.3 C)  Resp: 18   Filed Weights   03/20/14 1350  Weight: 134 lb (60.782 kg)    GENERAL:alert, no distress and comfortable SKIN: skin color,  texture, turgor are normal, no rashes or significant lesions EYES: normal, conjunctiva are pink and non-injected, sclera clear OROPHARYNX:no exudate, no erythema and lips, buccal mucosa, and tongue normal  NECK: supple, thyroid normal size, non-tender, without nodularity LYMPH:  no palpable lymphadenopathy in the cervical, axillary or inguinal LUNGS: clear to auscultation and percussion with normal breathing effort HEART: regular rate & rhythm and no murmurs and no lower extremity edema ABDOMEN:abdomen soft, non-tender and normal bowel sounds Musculoskeletal:no cyanosis of digits and no clubbing  PSYCH: alert & oriented x 3 with fluent speech NEURO: no focal motor/sensory deficits BREAST: Any palpable lump around the nipple in the right breast measuring 2-3 cm in size. 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 N0 M0 stage IA, lymph node biopsy and axilla negative grade 1  Pathology counseling:Discussed with the patient, the details of pathology including the type of breast cancer,the clinical staging, the significance of ER, PR and HER-2/neu receptors and the implications for treatment. After reviewing the pathology in detail, we proceeded to discuss the different treatment options between surgery, radiation, chemotherapy, antiestrogen therapies.  Recommendation: Surgery followed by discussion regarding Oncotype DX testing if lymph nodes are negative on final pathology. If  chemotherapy is not needed and radiation followed by antiestrogen therapy with aromatase inhibitor for 5 years  Oncotype DX counseling:I recommended Oncotype DX testing. I explained to the patient that this is a 17 gene panel to evaluate patient tumors DNA and determine using a recurrence score whether she has high risk of intermediate risk of low risk breast cancer. She understands that if her tumor was found to be high risk, she would benefit from systemic chemotherapy. If she was low risk, there would not be any benefit from chemotherapy. If intermediate risk, we would need to evaluate the score as well as other risk factors and determine if an abbreviated chemotherapy may be of benefit.  Return to clinic one week after surgery to go over the pathology report and to decide on adjuvant treatment plan.  All questions were answered. The patient knows to call the clinic with any problems, questions or concerns. I spent 40 minutes counseling the patient face to  face. The total time spent in the appointment was 60 minutes and more than 50% was on counseling.     Rulon Eisenmenger, MD 03/20/2014 3:50 PM

## 2014-03-20 NOTE — Assessment & Plan Note (Signed)
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 N0 M0 stage IA, lymph node biopsy and axilla negative grade 1  Pathology counseling:Discussed with the patient, the details of pathology including the type of breast cancer,the clinical staging, the significance of ER, PR and HER-2/neu receptors and the implications for treatment. After reviewing the pathology in detail, we proceeded to discuss the different treatment options between surgery, radiation, chemotherapy, antiestrogen therapies.  Recommendation: Surgery followed by discussion regarding Oncotype DX testing if lymph nodes are negative on final pathology. If chemotherapy is not needed and radiation followed by antiestrogen therapy with aromatase inhibitor for 5 years  Oncotype DX counseling:I recommended Oncotype DX testing. I explained to the patient that this is a 17 gene panel to evaluate patient tumors DNA and determine using a recurrence score whether she has high risk of intermediate risk of low risk breast cancer. She understands that if her tumor was found to be high risk, she would benefit from systemic chemotherapy. If she was low risk, there would not be any benefit from chemotherapy. If intermediate risk, we would need to evaluate the score as well as other risk factors and determine if an abbreviated chemotherapy may be of benefit.  Return to clinic one week after surgery to go over the pathology report and to decide on adjuvant treatment plan.

## 2014-03-25 ENCOUNTER — Encounter (INDEPENDENT_AMBULATORY_CARE_PROVIDER_SITE_OTHER): Payer: Self-pay | Admitting: General Surgery

## 2014-03-25 NOTE — Progress Notes (Signed)
Patient ID: Mckenzie Sanford, female   DOB: 1956-03-25, 58 y.o.   MRN: [id number]  Her case was presented today at the multidisciplinary breast cancer conference.  MRI demonstrated findings suspicious for nipple involvement. The tumor is ER/PR positive, HER-2 negative,  proliferation rate is 16%. I discussed these findings with her.  She has seen medical oncology. She sees radiation oncology next week. We once again discussed lumpectomy which would require removal of the nipple versus mastectomy. We talked about  need for radiation therapy with lumpectomy and potential of tattooing and nipple  in the future. We talked about possible reconstruction after mastectomy. We also discussed right axillary sentinel lymph node biopsy regardless of the procedure. I asked her to call me after she saw the radiation oncologist so we could discuss which surgery she would like to have.

## 2014-03-29 HISTORY — PX: BREAST SURGERY: SHX581

## 2014-04-01 ENCOUNTER — Encounter: Payer: Self-pay | Admitting: Hematology and Oncology

## 2014-04-01 ENCOUNTER — Encounter: Payer: Self-pay | Admitting: Radiation Oncology

## 2014-04-01 NOTE — Progress Notes (Signed)
New patient intake form sent to scan.   

## 2014-04-02 ENCOUNTER — Ambulatory Visit
Admission: RE | Admit: 2014-04-02 | Discharge: 2014-04-02 | Disposition: A | Discharge status: Home / Self Care | Payer: 59 | Source: Ambulatory Visit | Attending: Radiation Oncology | Admitting: Radiation Oncology

## 2014-04-02 ENCOUNTER — Encounter: Payer: Self-pay | Admitting: Radiation Oncology

## 2014-04-02 VITALS — BP 147/72 | HR 60 | Temp 98.0°F | Wt 131.2 lb

## 2014-04-02 DIAGNOSIS — C50411 Malignant neoplasm of upper-outer quadrant of right female breast: Secondary | ICD-10-CM | POA: Diagnosis present

## 2014-04-02 HISTORY — DX: Malignant neoplasm of unspecified site of unspecified female breast: C50.919

## 2014-04-02 NOTE — Progress Notes (Signed)
Please see the Nurse Progress Note in the MD Initial Consult Encounter for this patient. 

## 2014-04-03 ENCOUNTER — Encounter: Payer: Self-pay | Admitting: Radiation Oncology

## 2014-04-06 ENCOUNTER — Encounter (INDEPENDENT_AMBULATORY_CARE_PROVIDER_SITE_OTHER): Payer: Self-pay | Admitting: General Surgery

## 2014-04-06 DIAGNOSIS — C50911 Malignant neoplasm of unspecified site of right female breast: Secondary | ICD-10-CM

## 2014-04-06 NOTE — Progress Notes (Signed)
Patient ID: Mckenzie Sanford, female   DOB: 11-19-1955, 58 y.o.   MRN: [id number]  She wants to proceed with breast conservation.  She is aware that this will involve removing the nipple as it appears to be potentially involved on MRI.  Will start the scheduling process.

## 2014-04-14 ENCOUNTER — Encounter (HOSPITAL_BASED_OUTPATIENT_CLINIC_OR_DEPARTMENT_OTHER): Payer: Self-pay | Admitting: *Deleted

## 2014-04-14 NOTE — Progress Notes (Signed)
To come in for labs and ekg

## 2014-04-16 ENCOUNTER — Ambulatory Visit
Admission: RE | Admit: 2014-04-16 | Discharge: 2014-04-16 | Disposition: A | Discharge status: Home / Self Care | Payer: 59 | Source: Ambulatory Visit | Attending: General Surgery | Admitting: General Surgery

## 2014-04-16 ENCOUNTER — Encounter (HOSPITAL_BASED_OUTPATIENT_CLINIC_OR_DEPARTMENT_OTHER)
Admission: RE | Admit: 2014-04-16 | Discharge: 2014-04-16 | Disposition: A | Discharge status: Home / Self Care | Payer: 59 | Source: Ambulatory Visit | Attending: General Surgery | Admitting: General Surgery

## 2014-04-16 DIAGNOSIS — I10 Essential (primary) hypertension: Secondary | ICD-10-CM | POA: Diagnosis not present

## 2014-04-16 DIAGNOSIS — Z01818 Encounter for other preprocedural examination: Secondary | ICD-10-CM | POA: Diagnosis not present

## 2014-04-16 DIAGNOSIS — C779 Secondary and unspecified malignant neoplasm of lymph node, unspecified: Secondary | ICD-10-CM | POA: Diagnosis not present

## 2014-04-16 DIAGNOSIS — D0511 Intraductal carcinoma in situ of right breast: Secondary | ICD-10-CM | POA: Diagnosis present

## 2014-04-16 LAB — CBC WITH DIFFERENTIAL/PLATELET
BASOS ABS: 0 10*3/uL (ref 0.0–0.1)
BASOS PCT: 1 % (ref 0–1)
EOS ABS: 0.1 10*3/uL (ref 0.0–0.7)
EOS PCT: 2 % (ref 0–5)
HCT: 38.9 % (ref 36.0–46.0)
Hemoglobin: 13 g/dL (ref 12.0–15.0)
LYMPHS PCT: 45 % (ref 12–46)
Lymphs Abs: 2.1 10*3/uL (ref 0.7–4.0)
MCH: 28.8 pg (ref 26.0–34.0)
MCHC: 33.4 g/dL (ref 30.0–36.0)
MCV: 86.3 fL (ref 78.0–100.0)
Monocytes Absolute: 0.5 10*3/uL (ref 0.1–1.0)
Monocytes Relative: 10 % (ref 3–12)
Neutro Abs: 2 10*3/uL (ref 1.7–7.7)
Neutrophils Relative %: 42 % — ABNORMAL LOW (ref 43–77)
PLATELETS: 336 10*3/uL (ref 150–400)
RBC: 4.51 MIL/uL (ref 3.87–5.11)
RDW: 12.3 % (ref 11.5–15.5)
WBC: 4.6 10*3/uL (ref 4.0–10.5)

## 2014-04-16 LAB — COMPREHENSIVE METABOLIC PANEL
ALT: 24 U/L (ref 0–35)
AST: 19 U/L (ref 0–37)
Albumin: 3.9 g/dL (ref 3.5–5.2)
Alkaline Phosphatase: 134 U/L — ABNORMAL HIGH (ref 39–117)
Anion gap: 13 (ref 5–15)
BUN: 14 mg/dL (ref 6–23)
CALCIUM: 9.9 mg/dL (ref 8.4–10.5)
CO2: 27 meq/L (ref 19–32)
CREATININE: 0.63 mg/dL (ref 0.50–1.10)
Chloride: 101 mEq/L (ref 96–112)
GFR calc Af Amer: >90 mL/min (ref >90)
GFR calc non Af Amer: >90 mL/min (ref >90)
Glucose, Bld: 109 mg/dL — ABNORMAL HIGH (ref 70–99)
Potassium: 3.8 mEq/L (ref 3.7–5.3)
SODIUM: 141 meq/L (ref 137–147)
TOTAL PROTEIN: 7.5 g/dL (ref 6.0–8.3)
Total Bilirubin: <0.2 mg/dL — ABNORMAL LOW (ref 0.3–1.2)

## 2014-04-16 LAB — PROTIME-INR
INR: 0.98 (ref 0.00–1.49)
PROTHROMBIN TIME: 13.1 s (ref 11.6–15.2)

## 2014-04-17 ENCOUNTER — Encounter (HOSPITAL_BASED_OUTPATIENT_CLINIC_OR_DEPARTMENT_OTHER): Payer: Self-pay | Admitting: *Deleted

## 2014-04-17 LAB — CANCER ANTIGEN 27.29: CA 27.29: 24 U/mL (ref 0–39)

## 2014-04-20 ENCOUNTER — Encounter (HOSPITAL_BASED_OUTPATIENT_CLINIC_OR_DEPARTMENT_OTHER): Payer: Self-pay | Admitting: *Deleted

## 2014-04-20 ENCOUNTER — Ambulatory Visit (HOSPITAL_BASED_OUTPATIENT_CLINIC_OR_DEPARTMENT_OTHER): Payer: 59 | Admitting: Anesthesiology

## 2014-04-20 ENCOUNTER — Encounter (HOSPITAL_COMMUNITY)
Admission: RE | Admit: 2014-04-20 | Discharge: 2014-04-20 | Disposition: A | Discharge status: Home / Self Care | Payer: 59 | Source: Ambulatory Visit | Attending: General Surgery | Admitting: General Surgery

## 2014-04-20 ENCOUNTER — Ambulatory Visit (HOSPITAL_BASED_OUTPATIENT_CLINIC_OR_DEPARTMENT_OTHER)
Admission: RE | Admit: 2014-04-20 | Discharge: 2014-04-20 | Disposition: A | Discharge status: Home / Self Care | Payer: 59 | Source: Ambulatory Visit | Attending: General Surgery | Admitting: General Surgery

## 2014-04-20 ENCOUNTER — Encounter (HOSPITAL_BASED_OUTPATIENT_CLINIC_OR_DEPARTMENT_OTHER)
Admission: RE | Disposition: A | Discharge status: Home / Self Care | Payer: Self-pay | Source: Ambulatory Visit | Attending: General Surgery

## 2014-04-20 DIAGNOSIS — D0511 Intraductal carcinoma in situ of right breast: Secondary | ICD-10-CM | POA: Insufficient documentation

## 2014-04-20 DIAGNOSIS — C50911 Malignant neoplasm of unspecified site of right female breast: Secondary | ICD-10-CM

## 2014-04-20 DIAGNOSIS — I10 Essential (primary) hypertension: Secondary | ICD-10-CM | POA: Insufficient documentation

## 2014-04-20 DIAGNOSIS — C779 Secondary and unspecified malignant neoplasm of lymph node, unspecified: Secondary | ICD-10-CM | POA: Insufficient documentation

## 2014-04-20 DIAGNOSIS — C50919 Malignant neoplasm of unspecified site of unspecified female breast: Secondary | ICD-10-CM

## 2014-04-20 HISTORY — DX: Depression, unspecified: F32.A

## 2014-04-20 HISTORY — PX: RADIOACTIVE SEED GUIDED PARTIAL MASTECTOMY WITH AXILLARY SENTINEL LYMPH NODE BIOPSY: SHX6520

## 2014-04-20 HISTORY — DX: Major depressive disorder, single episode, unspecified: F32.9

## 2014-04-20 SURGERY — RADIOACTIVE SEED GUIDED PARTIAL MASTECTOMY WITH AXILLARY SENTINEL LYMPH NODE BIOPSY
Anesthesia: General | Site: Breast | Laterality: Right

## 2014-04-20 MED ORDER — FENTANYL CITRATE 0.05 MG/ML IJ SOLN
INTRAMUSCULAR | Status: AC
  Filled 2014-04-20: qty 2

## 2014-04-20 MED ORDER — HYDROMORPHONE HCL 1 MG/ML IJ SOLN
INTRAMUSCULAR | Status: AC
  Filled 2014-04-20: qty 1

## 2014-04-20 MED ORDER — TECHNETIUM TC 99M SULFUR COLLOID FILTERED
1.0000 | Freq: Once | INTRAVENOUS | Status: AC | PRN
  Administered 2014-04-20: 1 via INTRADERMAL

## 2014-04-20 MED ORDER — ACETAMINOPHEN 325 MG PO TABS: 650.0000 mg | ORAL_TABLET | ORAL | Status: DC | PRN

## 2014-04-20 MED ORDER — CEFAZOLIN SODIUM-DEXTROSE 2-3 GM-% IV SOLR
INTRAVENOUS | Status: AC
  Filled 2014-04-20: qty 50

## 2014-04-20 MED ORDER — FENTANYL CITRATE 0.05 MG/ML IJ SOLN
INTRAMUSCULAR | Status: AC
  Filled 2014-04-20: qty 4

## 2014-04-20 MED ORDER — DEXAMETHASONE SODIUM PHOSPHATE 4 MG/ML IJ SOLN
INTRAMUSCULAR | Status: DC | PRN
  Administered 2014-04-20: 10 mg via INTRAVENOUS

## 2014-04-20 MED ORDER — BUPIVACAINE HCL (PF) 0.5 % IJ SOLN
INTRAMUSCULAR | Status: DC | PRN
  Administered 2014-04-20: 16 mL

## 2014-04-20 MED ORDER — FENTANYL CITRATE 0.05 MG/ML IJ SOLN
50.0000 ug | INTRAMUSCULAR | Status: DC | PRN
  Administered 2014-04-20: 100 ug via INTRAVENOUS

## 2014-04-20 MED ORDER — DIPHENHYDRAMINE HCL 50 MG/ML IJ SOLN
INTRAMUSCULAR | Status: DC | PRN
  Administered 2014-04-20: 6.25 mg via INTRAVENOUS

## 2014-04-20 MED ORDER — SODIUM CHLORIDE 0.9 % IJ SOLN
INTRAMUSCULAR | Status: AC
  Filled 2014-04-20: qty 10

## 2014-04-20 MED ORDER — PROMETHAZINE HCL 25 MG/ML IJ SOLN: 6.2500 mg | INTRAMUSCULAR | Status: DC | PRN

## 2014-04-20 MED ORDER — METHYLENE BLUE 1 % INJ SOLN
INTRAMUSCULAR | Status: AC
  Filled 2014-04-20: qty 10

## 2014-04-20 MED ORDER — SODIUM CHLORIDE 0.9 % IJ SOLN: 3.0000 mL | INTRAMUSCULAR | Status: DC | PRN

## 2014-04-20 MED ORDER — HYDROMORPHONE HCL 1 MG/ML IJ SOLN
0.2500 mg | INTRAMUSCULAR | Status: DC | PRN
  Administered 2014-04-20: 0.25 mg via INTRAVENOUS
  Administered 2014-04-20: 0.5 mg via INTRAVENOUS

## 2014-04-20 MED ORDER — MIDAZOLAM HCL 5 MG/5ML IJ SOLN
INTRAMUSCULAR | Status: DC | PRN
  Administered 2014-04-20: 1 mg via INTRAVENOUS

## 2014-04-20 MED ORDER — CEFAZOLIN SODIUM-DEXTROSE 2-3 GM-% IV SOLR: 2.0000 g | INTRAVENOUS | Status: DC

## 2014-04-20 MED ORDER — OXYCODONE HCL 5 MG PO TABS: 5.0000 mg | ORAL_TABLET | Freq: Once | ORAL | Status: DC | PRN

## 2014-04-20 MED ORDER — EPHEDRINE SULFATE 50 MG/ML IJ SOLN
INTRAMUSCULAR | Status: DC | PRN
  Administered 2014-04-20: 15 mg via INTRAVENOUS

## 2014-04-20 MED ORDER — HYDROCODONE-ACETAMINOPHEN 5-325 MG PO TABS: 1.0000 | ORAL_TABLET | ORAL | Status: DC | PRN

## 2014-04-20 MED ORDER — OXYCODONE HCL 5 MG/5ML PO SOLN: 5.0000 mg | Freq: Once | ORAL | Status: DC | PRN

## 2014-04-20 MED ORDER — MIDAZOLAM HCL 2 MG/2ML IJ SOLN
INTRAMUSCULAR | Status: AC
  Filled 2014-04-20: qty 2

## 2014-04-20 MED ORDER — OXYCODONE HCL 5 MG PO TABS: 5.0000 mg | ORAL_TABLET | ORAL | Status: DC | PRN

## 2014-04-20 MED ORDER — MIDAZOLAM HCL 2 MG/2ML IJ SOLN
1.0000 mg | INTRAMUSCULAR | Status: DC | PRN
  Administered 2014-04-20: 2 mg via INTRAVENOUS

## 2014-04-20 MED ORDER — FENTANYL CITRATE 0.05 MG/ML IJ SOLN
INTRAMUSCULAR | Status: DC | PRN
  Administered 2014-04-20: 50 ug via INTRAVENOUS

## 2014-04-20 MED ORDER — BUPIVACAINE-EPINEPHRINE (PF) 0.5% -1:200000 IJ SOLN
INTRAMUSCULAR | Status: DC | PRN
  Administered 2014-04-20: 30 mL

## 2014-04-20 MED ORDER — ACETAMINOPHEN 650 MG RE SUPP: 650.0000 mg | RECTAL | Status: DC | PRN

## 2014-04-20 MED ORDER — BUPIVACAINE-EPINEPHRINE (PF) 0.5% -1:200000 IJ SOLN
INTRAMUSCULAR | Status: AC
  Filled 2014-04-20: qty 30

## 2014-04-20 MED ORDER — LIDOCAINE HCL (CARDIAC) 20 MG/ML IV SOLN
INTRAVENOUS | Status: DC | PRN
  Administered 2014-04-20: 40 mg via INTRAVENOUS

## 2014-04-20 MED ORDER — LACTATED RINGERS IV SOLN
INTRAVENOUS | Status: DC
  Administered 2014-04-20: 11:00:00 via INTRAVENOUS

## 2014-04-20 SURGICAL SUPPLY — 50 items
APL SKNCLS STERI-STRIP NONHPOA (GAUZE/BANDAGES/DRESSINGS) ×1
APPLIER CLIP 9.375 MED OPEN (MISCELLANEOUS) ×2
APR CLP MED 9.3 20 MLT OPN (MISCELLANEOUS) ×1
BENZOIN TINCTURE PRP APPL 2/3 (GAUZE/BANDAGES/DRESSINGS) ×2 IMPLANT
BINDER BREAST LRG (GAUZE/BANDAGES/DRESSINGS) ×1 IMPLANT
BINDER BREAST MEDIUM (GAUZE/BANDAGES/DRESSINGS) IMPLANT
BINDER BREAST XLRG (GAUZE/BANDAGES/DRESSINGS) IMPLANT
BINDER BREAST XXLRG (GAUZE/BANDAGES/DRESSINGS) IMPLANT
BLADE SURG 15 STRL LF DISP TIS (BLADE) ×1 IMPLANT
BLADE SURG 15 STRL SS (BLADE) ×2
CANISTER SUC SOCK COL 7IN (MISCELLANEOUS) IMPLANT
CANISTER SUCT 1200ML W/VALVE (MISCELLANEOUS) IMPLANT
CHLORAPREP W/TINT 26ML (MISCELLANEOUS) ×2 IMPLANT
CLIP APPLIE 9.375 MED OPEN (MISCELLANEOUS) ×1 IMPLANT
COVER BACK TABLE 60X90IN (DRAPES) ×2 IMPLANT
COVER MAYO STAND STRL (DRAPES) ×2 IMPLANT
COVER PROBE W GEL 5X96 (DRAPES) ×2 IMPLANT
DECANTER SPIKE VIAL GLASS SM (MISCELLANEOUS) IMPLANT
DEVICE DUBIN W/COMP PLATE 8390 (MISCELLANEOUS) ×2 IMPLANT
DRAPE LAPAROSCOPIC ABDOMINAL (DRAPES) ×2 IMPLANT
DRAPE UTILITY XL STRL (DRAPES) ×2 IMPLANT
ELECT COATED BLADE 2.86 ST (ELECTRODE) ×2 IMPLANT
ELECT REM PT RETURN 9FT ADLT (ELECTROSURGICAL) ×2
ELECTRODE REM PT RTRN 9FT ADLT (ELECTROSURGICAL) ×1 IMPLANT
GAUZE SPONGE 4X4 12PLY STRL (GAUZE/BANDAGES/DRESSINGS) IMPLANT
GLOVE BIOGEL PI IND STRL 7.0 (GLOVE) IMPLANT
GLOVE BIOGEL PI INDICATOR 7.0 (GLOVE) ×1
GLOVE ECLIPSE 6.5 STRL STRAW (GLOVE) ×2 IMPLANT
GOWN STRL REUS W/ TWL LRG LVL3 (GOWN DISPOSABLE) ×1 IMPLANT
GOWN STRL REUS W/TWL LRG LVL3 (GOWN DISPOSABLE) ×4
NDL HYPO 25X1 1.5 SAFETY (NEEDLE) ×1 IMPLANT
NDL SAFETY ECLIPSE 18X1.5 (NEEDLE) IMPLANT
NEEDLE HYPO 18GX1.5 SHARP (NEEDLE)
NEEDLE HYPO 25X1 1.5 SAFETY (NEEDLE) ×2 IMPLANT
NS IRRIG 1000ML POUR BTL (IV SOLUTION) ×1 IMPLANT
PACK BASIN DAY SURGERY FS (CUSTOM PROCEDURE TRAY) ×2 IMPLANT
PENCIL BUTTON HOLSTER BLD 10FT (ELECTRODE) ×2 IMPLANT
SLEEVE SCD COMPRESS KNEE MED (MISCELLANEOUS) ×2 IMPLANT
SPONGE GAUZE 4X4 12PLY STER LF (GAUZE/BANDAGES/DRESSINGS) IMPLANT
SPONGE LAP 4X18 X RAY DECT (DISPOSABLE) ×2 IMPLANT
STRIP CLOSURE SKIN 1/2X4 (GAUZE/BANDAGES/DRESSINGS) ×2 IMPLANT
SUT MNCRL AB 3-0 PS2 18 (SUTURE) ×1 IMPLANT
SUT MON AB 4-0 PC3 18 (SUTURE) ×2 IMPLANT
SUT SILK 2 0 SH (SUTURE) IMPLANT
SUT VICRYL 3-0 CR8 SH (SUTURE) ×2 IMPLANT
SYR CONTROL 10ML LL (SYRINGE) ×2 IMPLANT
TOWEL OR 17X24 6PK STRL BLUE (TOWEL DISPOSABLE) ×2 IMPLANT
TOWEL OR NON WOVEN STRL DISP B (DISPOSABLE) ×1 IMPLANT
TUBE CONNECTING 20X1/4 (TUBING) IMPLANT
YANKAUER SUCT BULB TIP NO VENT (SUCTIONS) IMPLANT

## 2014-04-20 NOTE — H&P (Signed)
Mckenzie Sanford, Jan 06, 1956   She has nvasive right breast cancer in the upper outer quadrant.she felt an abnormality beneath her nipple. Mammogram demonstrated a 1 cm lesion at the 11:00 position. This was verified by ultrasound. An enlarged lymph node was also noted in the axilla. 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 MRI suggested involvement of the nipple.She has seen Dr. [name] preop. No family history of breast cancer. Age at menarche was 70. No pregnancies. Age at menopause was between 75 and 7. No nipple discharge.  Allergies (Sonya Bynum, CMA; 03/17/2014 11:04 AM) Penicillamine *ASSORTED CLASSES*  Medication History (Sonya Bynum, CMA; 03/17/2014 11:03 AM) AmLODIPine Besylate (10MG Tablet, Oral daily) Active. Metoprolol Succinate ER (50MG Tablet ER 24HR, Oral daily) Active. Sertraline HCl (25MG Tablet, Oral daily) Active. Simvastatin (40MG Tablet, Oral daily) Active. Linzess (290MCG Capsule, Oral daily) Active. Irbesartan (150MG Tablet, Oral daily) Active.    Vitals (Sonya Bynum CMA; 03/17/2014 11:05 AM) 03/17/2014 11:04 AM Weight: 132 lb Height: 62in Body Surface Area: 1.62 m Body Mass Index: 24.14 kg/m Temp.: 51F(Temporal)  Pulse: 74 (Regular)  BP: 124/80 (Sitting, Left Arm, Standard)     Physical Exam  The physical exam findings are as follows: Note:General: WDWN in NAD. Pleasant and cooperative.  HEENT: [place]/AT, no facial masses  EYES: EOMI, no icterus  NECK: Supple, no obvious mass or thyroid enlargement.  CV: RRR, no murmur, no JVD.  CHEST: Breath sounds equal and clear. Respirations nonlabored.  BREASTS: Symmetrical in size. No dominant left breast masses. There is a small wound inferior to the nipple in the right breast. There is an area of  firmness deep to the lateral aspect of the nipple areolar complex.  ABDOMEN: Soft, nontender, nondistended, no masses, no organomegaly  MUSCULOSKELETAL: FROM, good muscle tone, no edema, no venous stasis changes  LYMPHATIC: No palpable cervical, supraclavicular, axillary adenopathy.  SKIN: No jaundice or suspicious rashes.  NEUROLOGIC: Alert and oriented, answers questions appropriately, normal gait and station.  PSYCHIATRIC: Normal mood, affect , and behavior.    Assessment & Plan  MALIGNANT NEOPLASM OF UPPER-OUTER QUADRANT OF LEFT FEMALE BREAST (174.4  C50.412)  Plan:   Right breast partial mastectomy, including nipple and right axillary sentinel lymph node biopsy.   [name], M.D.

## 2014-04-20 NOTE — Anesthesia Preprocedure Evaluation (Addendum)
Anesthesia Evaluation  Patient identified by MRN, date of birth, ID band Patient awake    Reviewed: Allergy & Precautions, H&P , NPO status , Patient's Chart, lab work & pertinent test results  Airway Mallampati: II  TM Distance: >3 FB Neck ROM: Full    Dental  (+) Teeth Intact, Dental Advisory Given   Pulmonary neg pulmonary ROS,          Cardiovascular hypertension, Pt. on medications and Pt. on home beta blockers     Neuro/Psych Depression negative neurological ROS     GI/Hepatic negative GI ROS, Neg liver ROS,   Endo/Other  negative endocrine ROS  Renal/GU negative Renal ROS     Musculoskeletal negative musculoskeletal ROS (+)   Abdominal   Peds  Hematology negative hematology ROS (+)   Anesthesia Other Findings   Reproductive/Obstetrics                            Anesthesia Physical Anesthesia Plan  ASA: II  Anesthesia Plan: General   Post-op Pain Management:    Induction: Intravenous  Airway Management Planned: LMA  Additional Equipment:   Intra-op Plan:   Post-operative Plan:   Informed Consent: I have reviewed the patients History and Physical, chart, labs and discussed the procedure including the risks, benefits and alternatives for the proposed anesthesia with the patient or authorized representative who has indicated his/her understanding and acceptance.   Dental advisory given  Plan Discussed with: CRNA and Surgeon  Anesthesia Plan Comments:         Anesthesia Quick Evaluation

## 2014-04-20 NOTE — Transfer of Care (Signed)
Immediate Anesthesia Transfer of Care Note  Patient: Mckenzie Sanford  Procedure(s) Performed: Procedure(s): RIGHT BREAST RADIOACTIVE SEED GUIDED LUMPECTOMY WITH RIGHT AXILLARY SENTINEL LYMPH NODE BIOPSY (Right)  Patient Location: PACU  Anesthesia Type:GA combined with regional for post-op pain  Level of Consciousness: sedated and patient cooperative  Airway & Oxygen Therapy: Patient Spontanous Breathing and Patient connected to face mask oxygen  Post-op Assessment: Report given to PACU RN and Post -op Vital signs reviewed and stable  Post vital signs: Reviewed and stable  Complications: No apparent anesthesia complications

## 2014-04-20 NOTE — Progress Notes (Signed)
Assisted Dr. Rob Fitzgerald with right, ultrasound guided, pectoralis block. Side rails up, monitors on throughout procedure. See vital signs in flow sheet. Tolerated Procedure well. 

## 2014-04-20 NOTE — Anesthesia Procedure Notes (Addendum)
Anesthesia Regional Block:  Pectoralis block  Pre-Anesthetic Checklist: ,, timeout performed, Correct Patient, Correct Site, Correct Laterality, Correct Procedure, Correct Position, site marked, Risks and benefits discussed,  Surgical consent,  Pre-op evaluation,  At surgeon's request and post-op pain management  Laterality: Right  Prep: chloraprep       Needles:  Injection technique: Single-shot  Needle Type: Echogenic Needle     Needle Length: 9cm 9 cm Needle Gauge: 21 and 21 G    Additional Needles:  Procedures: ultrasound guided (picture in chart) Pectoralis block Narrative:  Start time: 04/20/2014 10:55 AM End time: 04/20/2014 11:05 AM Injection made incrementally with aspirations every 5 mL. Anesthesiologist: Suzette Battiest E  Additional Notes: Risks and benefits explained to pt. Pt tolerated the procedure without immediate complications.   Procedure Name: LMA Insertion Date/Time: 04/20/2014 11:33 AM Performed by: Melynda Ripple D Pre-anesthesia Checklist: Patient identified, Emergency Drugs available, Suction available and Patient being monitored Patient Re-evaluated:Patient Re-evaluated prior to inductionOxygen Delivery Method: Circle System Utilized Preoxygenation: Pre-oxygenation with 100% oxygen Intubation Type: IV induction Ventilation: Mask ventilation without difficulty LMA: LMA inserted LMA Size: 4.0 Number of attempts: 1 Airway Equipment and Method: bite block Placement Confirmation: positive ETCO2 Tube secured with: Tape Dental Injury: Teeth and Oropharynx as per pre-operative assessment

## 2014-04-20 NOTE — Progress Notes (Signed)
Radiology staff present for nuc med injection. Pt tol well with no additional sedation. VSS (see flowsheet). Will call for sister/brother to bedside.

## 2014-04-20 NOTE — Discharge Instructions (Addendum)
[place] Office Phone Number [phone]  BREAST BIOPSY/ PARTIAL MASTECTOMY: POST OP INSTRUCTIONS  Always review your discharge instruction sheet given to you by the facility where your surgery was performed.  IF YOU HAVE DISABILITY OR FAMILY LEAVE FORMS, YOU MUST BRING THEM TO THE OFFICE FOR PROCESSING.  DO NOT GIVE THEM TO YOUR DOCTOR.  1. A prescription for pain medication may be given to you upon discharge.  Take your pain medication as prescribed, if needed.  If narcotic pain medicine is not needed, then you may take acetaminophen (Tylenol) or ibuprofen (Advil) as needed. 2. Take your usually prescribed medications unless otherwise directed 3. If you need a refill on your pain medication, please contact your pharmacy.  They will contact our office to request authorization.  Prescriptions will not be filled after 5pm or on week-ends. 4. You should eat very light the first 24 hours after surgery, such as soup, crackers, pudding, etc.  Resume your normal diet the day after surgery. 5. Most patients will experience some swelling and bruising in the breast.  Ice packs and a good support bra will help.  Swelling and bruising can take several days to resolve.  6. It is common to experience some constipation if taking pain medication after surgery.  Increasing fluid intake and taking a stool softener will usually help or prevent this problem from occurring.  A mild laxative (Milk of Magnesia or Miralax) should be taken according to package directions if there are no bowel movements after 48 hours. 7. Unless discharge instructions indicate otherwise, you may remove your bandages 48 hours after surgery, and you may shower at that time.  You may have steri-strips (small skin tapes) in place directly over the incision.  These strips should be left on the skin for.  If your surgeon used skin glue on the incision, you may shower in 24 hours.  The glue will flake off over the next 2-3 weeks.   Any sutures or staples will be removed at the office during your follow-up visit. 8. ACTIVITIES:  Light activities with your right arm for one week then may resume normal activities as long as you are pain-free.  Wearing a good support bra or sports bra minimizes pain and swelling.  You may have sexual intercourse when it is comfortable. a. You may drive when you no longer are taking prescription pain medication, you can comfortably wear a seatbelt, and you can safely maneuver your car and apply brakes. b. RETURN TO WORK:  ______________________________________________________________________________________ 9. You should see your doctor in the office for a follow-up appointment approximately two weeks after your surgery.   10.  11. OTHER INSTRUCTIONS: _______________________________________________________________________________________________ _____________________________________________________________________________________________________________________________________ _____________________________________________________________________________________________________________________________________ _____________________________________________________________________________________________________________________________________  WHEN TO CALL YOUR DOCTOR: 1. Fever over 101.0 2. Nausea and/or vomiting. 3. Extreme swelling or bruising. 4. Continued bleeding from incision. 5. Increased pain, redness, or drainage from the incision.  The clinic staff is available to answer your questions during regular business hours.  Please dont hesitate to call and ask to speak to one of the nurses for clinical concerns.  If you have a medical emergency, go to the nearest emergency room or call 911.  A surgeon from [facility] is always on call at the hospital.  For further questions, please visit [email or web address] (Error - No data available.)@   Post Anesthesia Home  Care Instructions  Activity: Get plenty of rest for the remainder of the day. A responsible adult should stay with you for 24 hours following the  procedure.  For the next 24 hours, DO NOT: -Drive a car -Paediatric nurse -Drink alcoholic beverages -Take any medication unless instructed by your physician -Make any legal decisions or sign important papers.  Meals: Start with liquid foods such as gelatin or soup. Progress to regular foods as tolerated. Avoid greasy, spicy, heavy foods. If nausea and/or vomiting occur, drink only clear liquids until the nausea and/or vomiting subsides. Call your physician if vomiting continues.  Special Instructions/Symptoms: Your throat may feel dry or sore from the anesthesia or the breathing tube placed in your throat during surgery. If this causes discomfort, gargle with warm salt water. The discomfort should disappear within 24 hours.

## 2014-04-20 NOTE — Anesthesia Postprocedure Evaluation (Signed)
  Anesthesia Post-op Note  Patient: Mckenzie Sanford  Procedure(s) Performed: Procedure(s): RIGHT BREAST RADIOACTIVE SEED GUIDED LUMPECTOMY WITH RIGHT AXILLARY SENTINEL LYMPH NODE BIOPSY (Right)  Patient Location: PACU  Anesthesia Type:GA combined with regional for post-op pain  Level of Consciousness: awake, alert  and oriented  Airway and Oxygen Therapy: Patient Spontanous Breathing  Post-op Pain: none  Post-op Assessment: Post-op Vital signs reviewed  Post-op Vital Signs: Reviewed  Last Vitals:  Filed Vitals:   04/20/14 1449  BP: 142/80  Pulse: 54  Temp: 36.7 C  Resp: 18    Complications: No apparent anesthesia complications

## 2014-04-20 NOTE — Op Note (Signed)
Operative Note  Mckenzie Sanford female 58 y.o. 04/20/2014  PREOPERATIVE DX:  Invasive right breast cancer  POSTOPERATIVE DX:  Same  PROCEDURE:   #1. Right axillary lymphatic mapping. #2. Right partial mastectomy after radioactive seed localization. #3. Right axillary sentinel lymph node biopsy (3 lymph nodes).         Surgeon: Odis Hollingshead   Assistants: none  Anesthesia: General endotracheal anesthesia  Indications:   This is a 58 year old female with a mammographic abnormality deep to the nipple. Biopsy was consistent with invasive breast cancer. On MRI, the lesion looks to involve the nipple. She now presents for central partial mastectomy and right axillary sentinel lymph node biopsy. She understands that the nipple areolar complex will be removed.    Procedure Detail:  She was seen in the holding area in the right breast marked with my initials.  Radioactive seed was identified using the neoprobe. She was brought to the operating room placed supine on the operating table and general anesthetic was given. The right breast and axillary areas were sterilely prepped and draped. Using a neoprobe I perform lymphatic mapping in the right axilla and marked an area of increased counts in the inferior right axillary area. Following this, I made a circular incision around the nipple areolar complex throught the skin and dermis. Using the neoprobe identified the area of increased counts at the 11:00 position. Using electrocautery, sbucutaneous flaps were raised and I performed a partial mastectomy with care to concentrated taking more tissue in the upper outer quadrant area. I marked the medial margin with a suture, anterior margin was the nipple areolar complex. Using the neoprobe the increased counts were noted in the specimen. There were no counts noted in the partial mastectomy bed. Specimen mammogram was performed and demonstrated the clip and the radioactive seeds to be in the specimen.  The anterior margin was the skin and this is where the clip and seed were closest to. Radiologist verified this.  Bleeding was controlled with electrocautery. Half percent plain Marcaine was infiltrated for local anesthetic effect. The subcutaneous tissues approximated with interrupted 3-0 Vicryl sutures. The skin was closed with 3-0 Monocryl subcuticular stitch.   Next, the right axilla was approached. A transverse incision was made in the inferior axillary area and the subcutaneous tissue divided with electrocautery. Using a neoprobe I was able to identify an enlarged lymph node with increased counts up to 800. This was removed and labeled sentinel lymph node #1. 2 other lymph nodes with increased counts were identified and removed as well using electrocautery. No other increased counts were noted in the axilla after this. Thus there were 3 sentinel lymph nodes removed and sent to pathology. The wound was inspected and bleeding was controlled electrocautery and hemoclips. Once hemostasis was adequate, Marcaine was injected for local anesthetic affect. The subcutaneous tissue was then closed with interrupted 3-0 Vicryl sutures. The skin was closed with a 4-0 Monocryl subcuticular stitch. Steri-Strips and sterile dressings were placed on both wounds. A breast binder was placed.  She tolerated the procedures well without any apparent complications and was taken to the recovery room in satisfactory condition.  Estimated Blood Loss:  less than 50 mL         Specimens: right breast tissue. Right axillary lymph nodes.        Complications:  * No complications entered in OR log *         Disposition: PACU - hemodynamically stable.  Condition: stable

## 2014-04-20 NOTE — Interval H&P Note (Signed)
History and Physical Interval Note:  04/20/2014 11:15 AM  Mckenzie Sanford  has presented today for surgery, with the diagnosis of right breast cancer  The various methods of treatment have been discussed with the patient and family. After consideration of risks, benefits and other options for treatment, the patient has consented to  Procedure(s): RIGHT BREAST RADIOACTIVE SEED GUIDED LUMPECTOMY WITH RIGHT AXILLARY SENTINEL LYMPH NODE BIOPSY (Right) as a surgical intervention .  The patient's history has been reviewed, patient examined, no change in status, stable for surgery.  I have reviewed the patient's chart and labs.  Questions were answered to the patient's satisfaction.     Mckenzie Sanford

## 2014-04-22 ENCOUNTER — Encounter (HOSPITAL_BASED_OUTPATIENT_CLINIC_OR_DEPARTMENT_OTHER): Payer: Self-pay | Admitting: General Surgery

## 2014-04-24 ENCOUNTER — Telehealth: Payer: Self-pay | Admitting: *Deleted

## 2014-04-24 NOTE — Telephone Encounter (Signed)
Late entry- pt called concerning binder after surgery. Pt relayed the binder is falling down when she is sleeping and when she is active. I called [facility] and spoke to nurse. Ok for pt to get a soft supportive sports bra that closes in the front. Relayed information to pt. No further needs voiced at this time.

## 2014-04-25 ENCOUNTER — Encounter (INDEPENDENT_AMBULATORY_CARE_PROVIDER_SITE_OTHER): Payer: Self-pay | Admitting: General Surgery

## 2014-04-25 NOTE — Progress Notes (Unsigned)
Patient ID: Mckenzie Sanford, female   DOB: 10/12/1955, 58 y.o.   MRN: [id number] I discussed her pathology with her today which is below. She has a T2 N1a right breast cancer.  ER and PR positive. We discussed further potential treatments including radiation therapy to the breast and neck axilla versus axillary lymph node dissection which hopefully she can avoid. We will make a referral to radiation oncology. Dr. [name] is going to see her early next week and I will see her on Wednesday of next week.  1. Breast, lumpectomy, Right - INVASIVE DUCTAL CARCINOMA, GRADE II/III SPANNING 2.5 CM. - DUCTAL CARCINOMA IN SITU WITH CALCIFICATIONS, INTERMEDIATE TO HIGH GRADE. - LOBULAR NEOPLASIA (ATYPICAL LOBULAR HYPERPLASIA). - THE SURGICAL RESECTION MARGINS ARE NEGATIVE FOR DUCTAL CARCINOMA. - SEE ONCOLOGY TABLE BELOW. 2. Lymph node, sentinel, biopsy, Right axillary #1 - METASTATIC CARCINOMA IN 1 OF 1 LYMPH NODE (1/1), WITH EXTRACAPSULAR EXTENSION. 3. Lymph node, sentinel, biopsy, Right axillary #2 - THERE IS NO EVIDENCE OF CARCINOMA IN 1 OF 1 LYMPH NODE (0/1). 4. Lymph node, sentinel, biopsy, Right axillary #3 - THERE IS NO EVIDENCE OF CARCINOMA IN 1 OF 1 LYMPH NODE (0/1).

## 2014-04-27 ENCOUNTER — Encounter: Payer: Self-pay | Admitting: *Deleted

## 2014-04-27 ENCOUNTER — Other Ambulatory Visit (INDEPENDENT_AMBULATORY_CARE_PROVIDER_SITE_OTHER): Payer: Self-pay | Admitting: General Surgery

## 2014-04-27 ENCOUNTER — Ambulatory Visit (HOSPITAL_BASED_OUTPATIENT_CLINIC_OR_DEPARTMENT_OTHER): Payer: 59 | Admitting: Hematology and Oncology

## 2014-04-27 ENCOUNTER — Telehealth: Payer: Self-pay | Admitting: *Deleted

## 2014-04-27 ENCOUNTER — Telehealth: Payer: Self-pay | Admitting: Hematology and Oncology

## 2014-04-27 DIAGNOSIS — C50411 Malignant neoplasm of upper-outer quadrant of right female breast: Secondary | ICD-10-CM

## 2014-04-27 DIAGNOSIS — Z170 Estrogen receptor positive status [ER+]: Secondary | ICD-10-CM

## 2014-04-27 NOTE — Progress Notes (Signed)
Patient Care Team: Tivis Ringer, MD as PCP - General (Internal Medicine) [name], MD as Consulting Physician (General Surgery) Rulon Eisenmenger, MD as Consulting Physician (Hematology and Oncology) [name], MD as Consulting Physician (Radiation Oncology)  DIAGNOSIS: Breast cancer of upper-outer quadrant of right female breast   Staging form: Breast, AJCC 7th Edition     Clinical: Stage IA (T1b, N0, cM0) - Signed by Rulon Eisenmenger, MD on 03/20/2014     Pathologic: No stage assigned - Unsigned   SUMMARY OF ONCOLOGIC HISTORY:   Breast cancer of upper-outer quadrant of right female breast   03/12/2014 Initial Biopsy Invasive ductal carcinoma with DCIS, right axillary lymph node biopsy negative, ER percent, PR 84%, Ki-67 16%, HER-2 negative   03/20/2014 Breast MRI Right breast upper outer quadrant 2.6 x 1.9 x 1.7 cm invasive ductal carcinoma with extension and involvement of the adjacent skin and nipple area complex, no abnormal lymph nodes   04/20/2014 Surgery Right breast lumpectomy: IDC grade 2/32.5 cm with intermediate to high-grade DCIS margins negative: 1/3 positive sentinel lymph node with extracapsular extension, ER 100%, PR 84%, HER-2 negative ratio 1.22, Ki-67 16% T2, N1, M0 stage II B    CHIEF COMPLIANT: followup after surgery  INTERVAL HISTORY: Mckenzie Sanford is a 58 year old African American lady with above-mentioned history of right-sided breast cancer treated with lumpectomy and sentinel lymph node study 04/20/2014. She is here for one-week followup to discuss the final pathology report and the treatment plan. She is extremely sore in the right arm but is slowly improving. She denies any fevers or chills. She is accompanied by her friends who had been very helpful to her.  REVIEW OF SYSTEMS:   Constitutional: Denies fevers, chills or abnormal weight loss Eyes: Denies blurriness of vision Ears, nose, mouth, throat, and face: Denies mucositis or sore  throat Respiratory: Denies cough, dyspnea or wheezes Cardiovascular: Denies palpitation, chest discomfort or lower extremity swelling Gastrointestinal:  Denies nausea, heartburn or change in bowel habits Skin: Denies abnormal skin rashes Lymphatics: Denies new lymphadenopathy or easy bruising Neurological:Denies numbness, tingling or new weaknesses Behavioral/Psych: Mood is stable, no new changes  Breast: soreness or recent surgery All other systems were reviewed with the patient and are negative.  I have reviewed the past medical history, past surgical history, social history and family history with the patient and they are unchanged from previous note.  ALLERGIES:  is allergic to shellfish allergy; ivp dye; and penicillins.  MEDICATIONS:  Current Outpatient Prescriptions  Medication Sig Dispense Refill  . amLODipine (NORVASC) 10 MG tablet Take 10 mg by mouth daily.    . Biotin 1000 MCG tablet Take 1,000 mcg by mouth 3 (three) times daily.    . cholecalciferol (VITAMIN D) 1000 UNITS tablet Take 1,000 Units by mouth daily.    . COD LIVER OIL PO Take by mouth.    Marland Kitchen HYDROcodone-acetaminophen (NORCO/VICODIN) 5-325 MG per tablet Take 1-2 tablets by mouth every 4 (four) hours as needed for moderate pain or severe pain. 30 tablet 0  . ibuprofen (ADVIL,MOTRIN) 200 MG tablet Take 400 mg by mouth every 6 (six) hours as needed.    . irbesartan (AVAPRO) 150 MG tablet     . IRBESARTAN PO Take by mouth daily.    . Linaclotide (LINZESS PO) Take by mouth.    . metoprolol succinate (TOPROL-XL) 50 MG 24 hr tablet Take 50 mg by mouth daily. Take with or immediately following a meal.    . Multiple  Vitamin (MULTIVITAMIN) tablet Take 1 tablet by mouth daily.    . sertraline (ZOLOFT) 25 MG tablet   11  . Sertraline HCl (ZOLOFT PO) Take by mouth.    . simvastatin (ZOCOR) 40 MG tablet Take 40 mg by mouth every evening.     No current facility-administered medications for this visit.    PHYSICAL  EXAMINATION: ECOG PERFORMANCE STATUS: 1 - Symptomatic but completely ambulatory  Filed Vitals:   04/27/14 0837  BP: 129/75  Pulse: 61  Temp: 98.1 F (36.7 C)  Resp: 18   Filed Weights   04/27/14 0837  Weight: 131 lb (59.421 kg)    GENERAL:alert, no distress and comfortable SKIN: skin color, texture, turgor are normal, no rashes or significant lesions EYES: normal, Conjunctiva are pink and non-injected, sclera clear OROPHARYNX:no exudate, no erythema and lips, buccal mucosa, and tongue normal  NECK: supple, thyroid normal size, non-tender, without nodularity LYMPH:  no palpable lymphadenopathy in the cervical, axillary or inguinal LUNGS: clear to auscultation and percussion with normal breathing effort HEART: regular rate & rhythm and no murmurs and no lower extremity edema ABDOMEN:abdomen soft, non-tender and normal bowel sounds Musculoskeletal:no cyanosis of digits and no clubbing  NEURO: alert & oriented x 3 with fluent speech, no focal motor/sensory deficits  LABORATORY DATA:  I have reviewed the data as listed   Chemistry      Component Value Date/Time   NA 141 04/16/2014 1430   K 3.8 04/16/2014 1430   CL 101 04/16/2014 1430   CO2 27 04/16/2014 1430   BUN 14 04/16/2014 1430   CREATININE 0.63 04/16/2014 1430   CREATININE 0.72 12/11/2011 1238      Component Value Date/Time   CALCIUM 9.9 04/16/2014 1430   ALKPHOS 134* 04/16/2014 1430   AST 19 04/16/2014 1430   ALT 24 04/16/2014 1430   BILITOT <0.2* 04/16/2014 1430       Lab Results  Component Value Date   WBC 4.6 04/16/2014   HGB 13.0 04/16/2014   HCT 38.9 04/16/2014   MCV 86.3 04/16/2014   PLT 336 04/16/2014   NEUTROABS 2.0 04/16/2014     RADIOGRAPHIC STUDIES: I have personally reviewed the radiology reports and agreed with their findings. No results found.   ASSESSMENT & PLAN:  Breast cancer of upper-outer quadrant of right female breast Right breast invasive ductal carcinoma final pathology  revealed tumor size 2.5 cm with DCIS one out of 3 positive sentinel lymph nodes with extracapsular extension, ER 100%, PR 84%, HER-2 negative, Ki-67 16%, T2a N1 M0 stage IIB  Pathology counseling: I discussed the final pathology report in great detail including the significance of extracapsular extension of a positive lymph node. I also discussed the final pathologic staging. Because her cancer was lymph node positive, there is no role of Oncotype DX testing anymore.  Recommendation: Adjuvant systemic chemotherapy with dose dense Adriamycin and Cytoxan x4 cycles followed by weekly Taxol x12 Chemotherapy counseling: I discussed the risks and benefits of chemotherapy including the risks of nausea/ vomiting, risk of infection from low WBC count, fatigue due to chemo or anemia, bruising or bleeding due to low platelets, mouth sores, loss/ change in taste and decreased appetite. Liver and kidney function will be monitored through out chemotherapy as abnormalities in liver and kidney function may be a side effect of treatment. Cardiac dysfunction due to Adriamycin was discussed in detail. Risk of permanent bone marrow dysfunction and leukemia due to chemo were also discussed.  Plan: 1. Port placement 2.  Echocardiogram 3. Chemotherapy education 4. Plan to start chemotherapy January 7 after allowing adequate time for wound healing 5. PREVENT study referral: to assess his Lipitor to protect heart function in patients undergoing cardiotoxic chemotherapy   return to clinic on January 7 for initiation of chemotherapy   Orders Placed This Encounter  Procedures  . CBC with Differential    Standing Status: Future     Number of Occurrences:      Standing Expiration Date: 04/27/2015  . Comprehensive metabolic panel (Cmet) - [facility]    Standing Status: Future     Number of Occurrences:      Standing Expiration Date: 04/27/2015  . 2D Echocardiogram without contrast    Standing Status: Future     Number of  Occurrences:      Standing Expiration Date: 04/27/2015    Scheduling Instructions:     PROVIDERS If this is NOT for EF then please REASON that  needs COMPLETE STUDY    Order Specific Question:  Type of Echo    Answer:  Complete    Order Specific Question:  Reason for Exam    Answer:  Pre chemo evaluation    Order Specific Question:  Where should this test be performed    Answer:  [facility]   The patient has a good understanding of the overall plan. she agrees with it. She will call with any problems that may develop before her next visit here.   Rulon Eisenmenger, MD 04/27/2014 9:42 AM

## 2014-04-27 NOTE — Assessment & Plan Note (Signed)
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 sentinel lymph nodes with extracapsular extension, ER 100%, PR 84%, HER-2 negative, Ki-67 16%, T2a N1 M0 stage IIB  Pathology counseling: I discussed the final pathology report in great detail including the significance of extracapsular extension of a positive lymph node. I also discussed the final pathologic staging. Because her cancer was lymph node positive, there is no role of Oncotype DX testing anymore.  Recommendation: Adjuvant systemic chemotherapy with dose dense Adriamycin and Cytoxan x4 cycles followed by weekly Taxol x12 Chemotherapy counseling: I discussed the risks and benefits of chemotherapy including the risks of nausea/ vomiting, risk of infection from low WBC count, fatigue due to chemo or anemia, bruising or bleeding due to low platelets, mouth sores, loss/ change in taste and decreased appetite. Liver and kidney function will be monitored through out chemotherapy as abnormalities in liver and kidney function may be a side effect of treatment. Cardiac dysfunction due to Adriamycin was discussed in detail. Risk of permanent bone marrow dysfunction and leukemia due to chemo were also discussed.  Plan: 1. Port placement 2. Echocardiogram 3. Chemotherapy education 4. Plan to start chemotherapy January 7 after allowing adequate time for wound healing   return to clinic on January 7 for initiation of chemotherapy

## 2014-04-27 NOTE — Telephone Encounter (Signed)
Per staff message and POF I have scheduled appts. Advised scheduler of appts and that 130 pm on 1/7 is first available  . JMW

## 2014-04-27 NOTE — Progress Notes (Signed)
Health Care Power of Attorney received from patient.  Sent to scan.

## 2014-04-27 NOTE — Telephone Encounter (Signed)
, °

## 2014-04-29 ENCOUNTER — Telehealth: Payer: Self-pay | Admitting: *Deleted

## 2014-04-29 ENCOUNTER — Telehealth: Payer: Self-pay | Admitting: Hematology and Oncology

## 2014-04-29 ENCOUNTER — Encounter (INDEPENDENT_AMBULATORY_CARE_PROVIDER_SITE_OTHER): Payer: Self-pay | Admitting: General Surgery

## 2014-04-29 NOTE — Telephone Encounter (Signed)
Per staff message and POF I have scheduled appts. Advised scheduler of appts and that the appt for 1/7 needs to be earlier. Md visit to late for first time. JMW

## 2014-04-29 NOTE — Telephone Encounter (Signed)
Ok to start first time treatment at 3pm on 1/7

## 2014-04-29 NOTE — Telephone Encounter (Signed)
Lft msg for pt no vm on home phone, left on cell phone confirming changing D/T for chemo edu class..... KJ

## 2014-04-29 NOTE — Progress Notes (Signed)
Patient ID: [name], female   DOB: [birth year], 58 y.o.   MRN: [id number]  Juhi L. Spreen 04/29/2014 10:09 AM Location: [facility] Patient #: [id number] DOB: [birth year] Single / Language: Cleophus Molt / Race: Black or African American Female History of Present Illness Odis Hollingshead MD; 04/29/2014 11:10 AM) Patient words: breast f/u.  The patient is a 58 year old female    Note:She is here for her first postoperative visit after right partial mastectomy and sentinel lymph node biopsy for invasive right breast cancer April 20, 2014. She has some swelling and is still very sore. Pathology demonstrates a T2 N1 lesion, ER/PR positive, with one out of 3 sentinel lymph nodes being positive. She has seen Dr. [name] and chemotherapy is recommended followed by radiation and then hormonal therapy.  Other Problems Davy Pique Bynum, CMA; 04/29/2014 10:09 AM) Anxiety Disorder Back Pain Breast Cancer High blood pressure  Past Surgical History Marjean Donna, CMA; 04/29/2014 10:09 AM) Breast Biopsy Right. Colon Polyp Removal - Colonoscopy Oral Surgery  Diagnostic Studies History Marjean Donna, CMA; 04/29/2014 10:09 AM) Colonoscopy 5-10 years ago Pap Smear 1-5 years ago  Allergies Marjean Donna, CMA; 04/29/2014 10:11 AM) Penicillamine *ASSORTED CLASSES*  Medication History (Sonya Bynum, CMA; 04/29/2014 10:11 AM) AmLODIPine Besylate (10MG  Tablet, Oral daily) Active. Metoprolol Succinate ER (50MG  Tablet ER 24HR, Oral daily) Active. Sertraline HCl (25MG  Tablet, Oral daily) Active. Simvastatin (40MG  Tablet, Oral daily) Active. Linzess (290MCG Capsule, Oral daily) Active. Irbesartan (150MG  Tablet, Oral daily) Active.  Social History Marjean Donna, [place]; 04/29/2014 10:09 AM) Alcohol use Moderate alcohol use. Caffeine use Tea. No drug use Tobacco use Never smoker.  Family History Marjean Donna, [place]; 04/29/2014 10:09 AM) Arthritis Sister. Heart disease in female  family member before age 11 Kidney Disease Brother, Sister. Seizure disorder Sister. Thyroid problems Sister.  Pregnancy / Birth History Marjean Donna, [place]; 04/29/2014 10:09 AM) Age at menarche 89 years. Age of menopause 74-55 Gravida 0 Irregular periods     Review of Systems ([place]; 04/29/2014 10:09 AM) General Not Present- Appetite Loss, Chills, Fatigue, Fever, Night Sweats, Weight Gain and Weight Loss. Skin Not Present- Change in Wart/Mole, Dryness, Hives, Jaundice, New Lesions, Non-Healing Wounds, Rash and Ulcer. HEENT Present- Wears glasses/contact lenses. Not Present- Earache, Hearing Loss, Hoarseness, Nose Bleed, Oral Ulcers, Ringing in the Ears, Seasonal Allergies, Sinus Pain, Sore Throat, Visual Disturbances and Yellow Eyes. Breast Present- Breast Mass. Not Present- Breast Pain, Nipple Discharge and Skin Changes. Cardiovascular Not Present- Chest Pain, Difficulty Breathing Lying Down, Leg Cramps, Palpitations, Rapid Heart Rate, Shortness of Breath and Swelling of Extremities. Gastrointestinal Present- Bloating, Constipation and Gets full quickly at meals. Not Present- Abdominal Pain, Bloody Stool, Change in Bowel Habits, Chronic diarrhea, Difficulty Swallowing, Excessive gas, Hemorrhoids, Indigestion, Nausea, Rectal Pain and Vomiting. Female Genitourinary Not Present- Frequency, Nocturia, Painful Urination, Pelvic Pain and Urgency. Musculoskeletal Present- Back Pain. Not Present- Joint Pain, Joint Stiffness, Muscle Pain, Muscle Weakness and Swelling of Extremities. Neurological Present- Tingling. Not Present- Decreased Memory, Fainting, Headaches, Numbness, Seizures, Tremor, Trouble walking and Weakness. Psychiatric Present- Anxiety. Not Present- Bipolar, Change in Sleep Pattern, Depression, Fearful and Frequent crying. Endocrine Present- Hot flashes. Not Present- Cold Intolerance, Excessive Hunger, Hair Changes, Heat Intolerance and New Diabetes. Hematology Not  Present- Easy Bruising, Excessive bleeding, Gland problems, HIV and Persistent Infections.  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 Still quite sore but is healing well. She will require a Port-A-Cath for chemotherapy.  Plan: Ultrasound-guided Port-A-Cath insertion. Return visit 6 weeks. The procedure risks and aftercare been explained. Risks include but are not limited to bleeding, infection, malfunction, pneumothorax, wound problems, DVT.  Current Plans Started Norco 5-325MG , 1-2 Tablet every four hours, as needed, #40, 04/29/2014, No Refill. Schedule for Surgery Follow up in 6 weeks or as needed Free Text Instructions : discussed with patient and provided information.  [name]. MD

## 2014-05-01 ENCOUNTER — Other Ambulatory Visit: Payer: 59

## 2014-05-01 ENCOUNTER — Encounter: Payer: Self-pay | Admitting: Hematology and Oncology

## 2014-05-01 NOTE — Progress Notes (Signed)
Put [employer] disability form on nurse's desk

## 2014-05-04 ENCOUNTER — Ambulatory Visit: Payer: 59

## 2014-05-04 ENCOUNTER — Other Ambulatory Visit: Payer: 59

## 2014-05-05 ENCOUNTER — Telehealth: Payer: Self-pay | Admitting: *Deleted

## 2014-05-05 ENCOUNTER — Encounter: Payer: Self-pay | Admitting: Hematology and Oncology

## 2014-05-05 NOTE — Progress Notes (Signed)
Faxed disability form to [place] @ [phone]

## 2014-05-11 ENCOUNTER — Other Ambulatory Visit: Payer: 59

## 2014-05-12 ENCOUNTER — Other Ambulatory Visit: Payer: Self-pay | Admitting: Hematology and Oncology

## 2014-05-14 ENCOUNTER — Encounter: Payer: Self-pay | Admitting: *Deleted

## 2014-05-14 ENCOUNTER — Telehealth: Payer: Self-pay | Admitting: Hematology and Oncology

## 2014-05-14 DIAGNOSIS — C50411 Malignant neoplasm of upper-outer quadrant of right female breast: Secondary | ICD-10-CM

## 2014-05-14 MED ORDER — INFLUENZA VAC SPLIT QUAD 0.5 ML IM SUSY: 0.5000 mL | PREFILLED_SYRINGE | Freq: Once | INTRAMUSCULAR | Status: DC

## 2014-05-14 NOTE — Progress Notes (Signed)
Pt called requesting flu shot prior to chemotherapy. Received order from Dr. [name]. Pt prefer to have scheduled on 05/18/14.

## 2014-05-14 NOTE — Telephone Encounter (Signed)
lvm for pt regarding to Dec appt.Marland KitchenMarland Kitchen

## 2014-05-15 ENCOUNTER — Telehealth: Payer: Self-pay

## 2014-05-15 NOTE — Telephone Encounter (Signed)
Notified patient of date and time for echo.  Pt voiced understanding.

## 2014-05-15 NOTE — Telephone Encounter (Signed)
Returned pt call to confirm pt appt d/t on 1/7.  Found appointment made in afternoon with 1st chemo at 3 pm.  Let pt know I would need to correct and she should expect to hear from scheduling next week.  Pt voiced understanding.  POF sent

## 2014-05-15 NOTE — Telephone Encounter (Signed)
Mammogram and Ultrasound results dtd 04/20/14 rcvd from [facility] Dr Marcelo Baldy.  Copy to Dr [name].  Original to scan.

## 2014-05-18 ENCOUNTER — Telehealth: Payer: Self-pay | Admitting: Hematology and Oncology

## 2014-05-18 ENCOUNTER — Ambulatory Visit (HOSPITAL_BASED_OUTPATIENT_CLINIC_OR_DEPARTMENT_OTHER): Payer: 59

## 2014-05-18 ENCOUNTER — Ambulatory Visit: Payer: 59

## 2014-05-18 ENCOUNTER — Telehealth: Payer: Self-pay | Admitting: *Deleted

## 2014-05-18 VITALS — BP 141/69 | HR 55 | Temp 97.9°F

## 2014-05-18 DIAGNOSIS — Z23 Encounter for immunization: Secondary | ICD-10-CM

## 2014-05-18 MED ORDER — INFLUENZA VAC SPLIT QUAD 0.5 ML IM SUSY
0.5000 mL | PREFILLED_SYRINGE | Freq: Once | INTRAMUSCULAR | Status: AC
  Administered 2014-05-18: 0.5 mL via INTRAMUSCULAR
  Filled 2014-05-18: qty 0.5

## 2014-05-18 NOTE — Patient Instructions (Signed)
Avian Influenza Viruses Avian influenza or "bird flu" is also known as the H5N1 virus. It occurs naturally in wild and domestic birds. Bird flu is easily spread (contagious) among birds and is deadly to them. Though rare, bird flu can cause disease in humans.  CAUSES  Infected birds can shed the H5N1 virus through:   Feces.  Nasal secretions.  Saliva. Birds become infected when they come into contact with infected birds or contaminated surfaces. The bird flu virus is spread from country to country through international poultry trade or by migrating birds.  MODES OF TRANSMISSION TO HUMANS The bird flu virus does not normally infect humans. However, the virus can infect humans who have contact with infected birds, breathe in dust or touch surfaces contaminated with the virus. Human-to-human transmission of the H5N1 virus has been rare. The virus lacks the ability to grow itself (replicate) in humans. However, because all influenza viruses can mutate, scientists are concerned the H5N1 virus will someday replicate itself and make human-to-human transmission easier. If this happens, an influenza "pandemic" could occur.  SYMPTOMS   Symptoms of H5N1 virus are similar to other influenza viruses:  Fever.  Cough.  Sore throat.  Nausea and vomiting.  Diarrhea.  Muscle aches.  Tiredness (malaise).  Some people may get inflammation or redness of the eyes (conjunctivitis).  Life-threatening complications may result in the death of the patient. These include:  Viral pneumonia.  Breathing (respiratory) distress syndrome.  Multi-organ failure. DIAGNOSIS   A person with a respiratory illness may be suffering from bird flu if direct or indirect contact has been made with infected birds. This includes handling or taking care of sick birds. The H5N1 virus may also be suspected if a person has breathed in particles or touched surfaces contaminated with the virus.  In addition to the above  symptoms, a chest X-ray is useful to detect pneumonia.  Fluid specimens such as a sputum sample may be sent to a laboratory for further investigation.  Blood tests may be done to help detect the H5N1 virus. TREATMENT   The H5N1 virus has shown resistance to amantadine and rimantadine, which are two antiviral drugs commonly used for other influenza viruses. However, two other antivirals, oseltamivir and zanamivir, seem to be effective against the H5N1 strain.  If bird flu is suspected in a person, treatment should start immediately without waiting for laboratory confirmation.  Treatment for the H5N1 strain is essentially the same as treating other influenza viruses. PREVENTION   Stay home from work, school, and errands when you are sick. Not being in contact with other people will help stop the spread of illness.  Cover your mouth and nose with your arm when coughing or sneezing. This may help keep those around you from getting sick.  Wash your hands often with warm water and soap. Illnesses are often spread when a person touches something that is contaminated with germs and then touches his or her eyes, nose, or mouth.  Antiviral medications can help prevent the flu.  For optimal health, get plenty of rest, eat a healthy diet, and exercise. Document Released: 05/18/2003 Document Revised: 09/29/2013 Document Reviewed: 12/12/2007 ExitCare Patient Information 2015 [employer]. This information is not intended to replace advice given to you by your health care provider. Make sure you discuss any questions you have with your health care provider.  

## 2014-05-18 NOTE — Telephone Encounter (Signed)
[place] adjusted 1/7 appt

## 2014-05-18 NOTE — Telephone Encounter (Signed)
S/w pt confirming labs/ov/treatment per 12/18 POF due to first [facility] treatment, pt req updated sch, mailed updated sch to pt ... KJ

## 2014-05-19 ENCOUNTER — Ambulatory Visit (HOSPITAL_COMMUNITY)
Admission: RE | Admit: 2014-05-19 | Discharge: 2014-05-19 | Disposition: A | Discharge status: Home / Self Care | Payer: Medicare Other | Source: Ambulatory Visit | Attending: Hematology and Oncology | Admitting: Hematology and Oncology

## 2014-05-19 DIAGNOSIS — Z01818 Encounter for other preprocedural examination: Secondary | ICD-10-CM | POA: Diagnosis present

## 2014-05-19 DIAGNOSIS — C50411 Malignant neoplasm of upper-outer quadrant of right female breast: Secondary | ICD-10-CM | POA: Insufficient documentation

## 2014-05-19 NOTE — Progress Notes (Signed)
Echocardiogram 2D Echocardiogram has been performed.  Jennene Downie 05/19/2014, 1:50 PM

## 2014-05-26 ENCOUNTER — Other Ambulatory Visit: Payer: Self-pay | Admitting: Hematology and Oncology

## 2014-05-26 ENCOUNTER — Encounter (HOSPITAL_BASED_OUTPATIENT_CLINIC_OR_DEPARTMENT_OTHER): Payer: Self-pay | Admitting: *Deleted

## 2014-05-26 NOTE — Progress Notes (Signed)
Pt was here for surgery 11/15-will come in for bmet-had ekg 11/15-

## 2014-05-28 ENCOUNTER — Other Ambulatory Visit: Payer: Self-pay

## 2014-05-28 ENCOUNTER — Encounter (HOSPITAL_BASED_OUTPATIENT_CLINIC_OR_DEPARTMENT_OTHER)
Admission: RE | Admit: 2014-05-28 | Discharge: 2014-05-28 | Disposition: A | Discharge status: Home / Self Care | Payer: 59 | Source: Ambulatory Visit | Attending: General Surgery | Admitting: General Surgery

## 2014-05-28 DIAGNOSIS — F329 Major depressive disorder, single episode, unspecified: Secondary | ICD-10-CM | POA: Diagnosis not present

## 2014-05-28 DIAGNOSIS — I10 Essential (primary) hypertension: Secondary | ICD-10-CM | POA: Diagnosis not present

## 2014-05-28 DIAGNOSIS — C50911 Malignant neoplasm of unspecified site of right female breast: Secondary | ICD-10-CM | POA: Diagnosis not present

## 2014-05-28 DIAGNOSIS — Z79899 Other long term (current) drug therapy: Secondary | ICD-10-CM | POA: Diagnosis not present

## 2014-05-28 DIAGNOSIS — C50411 Malignant neoplasm of upper-outer quadrant of right female breast: Secondary | ICD-10-CM

## 2014-05-28 LAB — BASIC METABOLIC PANEL
Anion gap: 6 (ref 5–15)
BUN: 11 mg/dL (ref 6–23)
CO2: 28 mmol/L (ref 19–32)
Calcium: 9.6 mg/dL (ref 8.4–10.5)
Chloride: 105 mEq/L (ref 96–112)
Creatinine, Ser: 0.75 mg/dL (ref 0.50–1.10)
GFR calc Af Amer: >90 mL/min (ref >90)
GLUCOSE: 99 mg/dL (ref 70–99)
Potassium: 4.3 mmol/L (ref 3.5–5.1)
Sodium: 139 mmol/L (ref 135–145)

## 2014-05-28 MED ORDER — LIDOCAINE-PRILOCAINE 2.5-2.5 % EX CREA: TOPICAL_CREAM | CUTANEOUS | Status: DC

## 2014-05-28 MED ORDER — ONDANSETRON HCL 8 MG PO TABS: 8.0000 mg | ORAL_TABLET | Freq: Two times a day (BID) | ORAL | Status: DC | PRN

## 2014-05-28 MED ORDER — LORAZEPAM 0.5 MG PO TABS: 0.5000 mg | ORAL_TABLET | Freq: Four times a day (QID) | ORAL | Status: DC | PRN

## 2014-05-28 MED ORDER — DEXAMETHASONE 4 MG PO TABS: ORAL_TABLET | ORAL | Status: DC

## 2014-05-28 MED ORDER — PROCHLORPERAZINE MALEATE 10 MG PO TABS: 10.0000 mg | ORAL_TABLET | Freq: Four times a day (QID) | ORAL | Status: DC | PRN

## 2014-05-28 NOTE — Progress Notes (Unsigned)
  Pre-treatment orders released.  Ativan called in to [facility].  LMOVM - scrips ready.

## 2014-06-01 ENCOUNTER — Ambulatory Visit (HOSPITAL_BASED_OUTPATIENT_CLINIC_OR_DEPARTMENT_OTHER): Payer: Medicare Other | Admitting: Certified Registered"

## 2014-06-01 ENCOUNTER — Ambulatory Visit (HOSPITAL_COMMUNITY): Payer: Medicare Other

## 2014-06-01 ENCOUNTER — Ambulatory Visit (HOSPITAL_BASED_OUTPATIENT_CLINIC_OR_DEPARTMENT_OTHER)
Admission: RE | Admit: 2014-06-01 | Discharge: 2014-06-01 | Disposition: A | Discharge status: Home / Self Care | Payer: Medicare Other | Source: Ambulatory Visit | Attending: General Surgery | Admitting: General Surgery

## 2014-06-01 ENCOUNTER — Encounter (HOSPITAL_BASED_OUTPATIENT_CLINIC_OR_DEPARTMENT_OTHER)
Admission: RE | Disposition: A | Discharge status: Home / Self Care | Payer: Self-pay | Source: Ambulatory Visit | Attending: General Surgery

## 2014-06-01 ENCOUNTER — Encounter (HOSPITAL_BASED_OUTPATIENT_CLINIC_OR_DEPARTMENT_OTHER): Payer: Self-pay

## 2014-06-01 ENCOUNTER — Telehealth: Payer: Self-pay | Admitting: Physical Therapy

## 2014-06-01 DIAGNOSIS — Z95828 Presence of other vascular implants and grafts: Secondary | ICD-10-CM

## 2014-06-01 DIAGNOSIS — C50911 Malignant neoplasm of unspecified site of right female breast: Secondary | ICD-10-CM | POA: Insufficient documentation

## 2014-06-01 DIAGNOSIS — F329 Major depressive disorder, single episode, unspecified: Secondary | ICD-10-CM | POA: Insufficient documentation

## 2014-06-01 DIAGNOSIS — I10 Essential (primary) hypertension: Secondary | ICD-10-CM | POA: Diagnosis not present

## 2014-06-01 DIAGNOSIS — Z79899 Other long term (current) drug therapy: Secondary | ICD-10-CM | POA: Diagnosis not present

## 2014-06-01 HISTORY — PX: PORTACATH PLACEMENT: SHX2246

## 2014-06-01 LAB — POCT HEMOGLOBIN-HEMACUE: HEMOGLOBIN: 13.9 g/dL (ref 12.0–15.0)

## 2014-06-01 SURGERY — INSERTION, TUNNELED CENTRAL VENOUS DEVICE, WITH PORT
Anesthesia: General | Site: Chest | Laterality: Right

## 2014-06-01 MED ORDER — HEPARIN SOD (PORK) LOCK FLUSH 100 UNIT/ML IV SOLN
INTRAVENOUS | Status: DC | PRN
  Administered 2014-06-01: 150 [IU] via INTRAVENOUS

## 2014-06-01 MED ORDER — HYDROCODONE-ACETAMINOPHEN 5-325 MG PO TABS: 1.0000 | ORAL_TABLET | ORAL | Status: DC | PRN

## 2014-06-01 MED ORDER — PROPOFOL 10 MG/ML IV BOLUS
INTRAVENOUS | Status: DC | PRN
  Administered 2014-06-01: 150 mg via INTRAVENOUS

## 2014-06-01 MED ORDER — FENTANYL CITRATE 0.05 MG/ML IJ SOLN: 50.0000 ug | INTRAMUSCULAR | Status: DC | PRN

## 2014-06-01 MED ORDER — MIDAZOLAM HCL 2 MG/2ML IJ SOLN: 1.0000 mg | INTRAMUSCULAR | Status: DC | PRN

## 2014-06-01 MED ORDER — LIDOCAINE HCL (CARDIAC) 20 MG/ML IV SOLN
INTRAVENOUS | Status: DC | PRN
  Administered 2014-06-01: 60 mg via INTRAVENOUS

## 2014-06-01 MED ORDER — OXYCODONE HCL 5 MG PO TABS
5.0000 mg | ORAL_TABLET | Freq: Once | ORAL | Status: AC | PRN
  Administered 2014-06-01: 5 mg via ORAL

## 2014-06-01 MED ORDER — MIDAZOLAM HCL 2 MG/2ML IJ SOLN
INTRAMUSCULAR | Status: AC
  Filled 2014-06-01: qty 2

## 2014-06-01 MED ORDER — LIDOCAINE HCL (PF) 1 % IJ SOLN
INTRAMUSCULAR | Status: AC
  Filled 2014-06-01: qty 30

## 2014-06-01 MED ORDER — HYDROMORPHONE HCL 1 MG/ML IJ SOLN
0.2500 mg | INTRAMUSCULAR | Status: DC | PRN
Start: 2014-06-01 — End: 2014-06-01
  Administered 2014-06-01: 0.25 mg via INTRAVENOUS

## 2014-06-01 MED ORDER — OXYCODONE HCL 5 MG PO TABS
ORAL_TABLET | ORAL | Status: AC
  Filled 2014-06-01: qty 1

## 2014-06-01 MED ORDER — DEXAMETHASONE SODIUM PHOSPHATE 4 MG/ML IJ SOLN
INTRAMUSCULAR | Status: DC | PRN
  Administered 2014-06-01: 10 mg via INTRAVENOUS

## 2014-06-01 MED ORDER — HEPARIN (PORCINE) IN NACL 2-0.9 UNIT/ML-% IJ SOLN
INTRAMUSCULAR | Status: AC
  Filled 2014-06-01: qty 500

## 2014-06-01 MED ORDER — FENTANYL CITRATE 0.05 MG/ML IJ SOLN
INTRAMUSCULAR | Status: AC
  Filled 2014-06-01: qty 4

## 2014-06-01 MED ORDER — OXYCODONE HCL 5 MG/5ML PO SOLN: 5.0000 mg | Freq: Once | ORAL | Status: AC | PRN

## 2014-06-01 MED ORDER — HEPARIN SOD (PORK) LOCK FLUSH 100 UNIT/ML IV SOLN
INTRAVENOUS | Status: AC
  Filled 2014-06-01: qty 5

## 2014-06-01 MED ORDER — LIDOCAINE HCL (PF) 0.5 % IJ SOLN
INTRAMUSCULAR | Status: AC
  Filled 2014-06-01: qty 50

## 2014-06-01 MED ORDER — VANCOMYCIN HCL IN DEXTROSE 1-5 GM/200ML-% IV SOLN
1000.0000 mg | INTRAVENOUS | Status: AC
  Administered 2014-06-01: 1000 mg via INTRAVENOUS

## 2014-06-01 MED ORDER — HYDROMORPHONE HCL 1 MG/ML IJ SOLN
INTRAMUSCULAR | Status: AC
Start: 2014-06-01 — End: 2014-06-01
  Filled 2014-06-01: qty 1

## 2014-06-01 MED ORDER — BUPIVACAINE HCL (PF) 0.5 % IJ SOLN
INTRAMUSCULAR | Status: DC | PRN
  Administered 2014-06-01: 9 mL

## 2014-06-01 MED ORDER — FENTANYL CITRATE 0.05 MG/ML IJ SOLN
INTRAMUSCULAR | Status: DC | PRN
  Administered 2014-06-01: 50 ug via INTRAVENOUS

## 2014-06-01 MED ORDER — PROMETHAZINE HCL 25 MG/ML IJ SOLN
6.2500 mg | INTRAMUSCULAR | Status: DC | PRN
Start: 2014-06-01 — End: 2014-06-01

## 2014-06-01 MED ORDER — HEPARIN (PORCINE) IN NACL 2-0.9 UNIT/ML-% IJ SOLN
INTRAMUSCULAR | Status: DC | PRN
Start: 2014-06-01 — End: 2014-06-01
  Administered 2014-06-01: 500 mL via INTRAVENOUS

## 2014-06-01 MED ORDER — ONDANSETRON HCL 4 MG/2ML IJ SOLN
INTRAMUSCULAR | Status: DC | PRN
  Administered 2014-06-01: 4 mg via INTRAVENOUS

## 2014-06-01 MED ORDER — BUPIVACAINE HCL (PF) 0.5 % IJ SOLN
INTRAMUSCULAR | Status: AC
  Filled 2014-06-01: qty 30

## 2014-06-01 MED ORDER — VANCOMYCIN HCL IN DEXTROSE 1-5 GM/200ML-% IV SOLN
INTRAVENOUS | Status: AC
  Filled 2014-06-01: qty 200

## 2014-06-01 MED ORDER — MIDAZOLAM HCL 5 MG/5ML IJ SOLN
INTRAMUSCULAR | Status: DC | PRN
  Administered 2014-06-01: 2 mg via INTRAVENOUS

## 2014-06-01 MED ORDER — LACTATED RINGERS IV SOLN
INTRAVENOUS | Status: DC
  Administered 2014-06-01 (×2): via INTRAVENOUS

## 2014-06-01 SURGICAL SUPPLY — 58 items
APL SKNCLS STERI-STRIP NONHPOA (GAUZE/BANDAGES/DRESSINGS) ×1
BAG DECANTER FOR FLEXI CONT (MISCELLANEOUS) ×2 IMPLANT
BENZOIN TINCTURE PRP APPL 2/3 (GAUZE/BANDAGES/DRESSINGS) ×2 IMPLANT
BLADE CLIPPER SURG (BLADE) IMPLANT
BLADE SURG 11 STRL SS (BLADE) ×2 IMPLANT
BLADE SURG 15 STRL LF DISP TIS (BLADE) IMPLANT
BLADE SURG 15 STRL SS (BLADE)
CANISTER SUCT 1200ML W/VALVE (MISCELLANEOUS) IMPLANT
CHLORAPREP W/TINT 26ML (MISCELLANEOUS) ×2 IMPLANT
CLEANER CAUTERY TIP 5X5 PAD (MISCELLANEOUS) ×1 IMPLANT
COVER BACK TABLE 60X90IN (DRAPES) ×2 IMPLANT
COVER MAYO STAND STRL (DRAPES) ×2 IMPLANT
COVER PROBE 5X48 (MISCELLANEOUS) ×2
DECANTER SPIKE VIAL GLASS SM (MISCELLANEOUS) IMPLANT
DRAPE C-ARM 42X72 X-RAY (DRAPES) ×2 IMPLANT
DRAPE LAPAROTOMY TRNSV 102X78 (DRAPE) ×2 IMPLANT
DRAPE UTILITY XL STRL (DRAPES) ×2 IMPLANT
DRSG TEGADERM 2-3/8X2-3/4 SM (GAUZE/BANDAGES/DRESSINGS) ×2 IMPLANT
DRSG TEGADERM 4X4.75 (GAUZE/BANDAGES/DRESSINGS) ×2 IMPLANT
ELECT REM PT RETURN 9FT ADLT (ELECTROSURGICAL) ×2
ELECTRODE REM PT RTRN 9FT ADLT (ELECTROSURGICAL) ×1 IMPLANT
GAUZE SPONGE 4X4 16PLY XRAY LF (GAUZE/BANDAGES/DRESSINGS) IMPLANT
GLOVE BIOGEL PI IND STRL 7.0 (GLOVE) IMPLANT
GLOVE BIOGEL PI IND STRL 8.5 (GLOVE) ×1 IMPLANT
GLOVE BIOGEL PI INDICATOR 7.0 (GLOVE) ×1
GLOVE BIOGEL PI INDICATOR 8.5 (GLOVE) ×1
GLOVE ECLIPSE 6.5 STRL STRAW (GLOVE) ×1 IMPLANT
GLOVE ECLIPSE 8.0 STRL XLNG CF (GLOVE) ×2 IMPLANT
GLOVE EXAM NITRILE EXT CUFF MD (GLOVE) ×2 IMPLANT
GOWN STRL REUS W/ TWL LRG LVL3 (GOWN DISPOSABLE) ×1 IMPLANT
GOWN STRL REUS W/ TWL XL LVL3 (GOWN DISPOSABLE) ×1 IMPLANT
GOWN STRL REUS W/TWL LRG LVL3 (GOWN DISPOSABLE) ×2
GOWN STRL REUS W/TWL XL LVL3 (GOWN DISPOSABLE) ×2
IV CATH PLACEMENT UNIT 16 GA (IV SOLUTION) IMPLANT
IV KIT MINILOC 20X1 SAFETY (NEEDLE) IMPLANT
KIT CVR 48X5XPRB PLUP LF (MISCELLANEOUS) IMPLANT
KIT PORT POWER 8FR ISP CVUE (Catheter) ×1 IMPLANT
NDL HYPO 25X1 1.5 SAFETY (NEEDLE) ×1 IMPLANT
NEEDLE HYPO 22GX1.5 SAFETY (NEEDLE) IMPLANT
NEEDLE HYPO 25X1 1.5 SAFETY (NEEDLE) ×2 IMPLANT
PACK BASIN DAY SURGERY FS (CUSTOM PROCEDURE TRAY) ×2 IMPLANT
PAD CLEANER CAUTERY TIP 5X5 (MISCELLANEOUS) ×1
PENCIL BUTTON HOLSTER BLD 10FT (ELECTRODE) ×2 IMPLANT
SET SHEATH INTRODUCER 10FR (MISCELLANEOUS) IMPLANT
SLEEVE SCD COMPRESS KNEE MED (MISCELLANEOUS) ×1 IMPLANT
SPONGE GAUZE 2X2 8PLY STRL LF (GAUZE/BANDAGES/DRESSINGS) ×3 IMPLANT
SPONGE GAUZE 4X4 12PLY STER LF (GAUZE/BANDAGES/DRESSINGS) ×3 IMPLANT
STRIP CLOSURE SKIN 1/2X4 (GAUZE/BANDAGES/DRESSINGS) ×2 IMPLANT
SUT MNCRL AB 4-0 PS2 18 (SUTURE) ×2 IMPLANT
SUT VIC AB 2-0 SH 18 (SUTURE) ×2 IMPLANT
SUT VIC AB 3-0 SH 27 (SUTURE) ×2
SUT VIC AB 3-0 SH 27X BRD (SUTURE) ×1 IMPLANT
SYR 5ML LUER SLIP (SYRINGE) ×2 IMPLANT
SYR CONTROL 10ML LL (SYRINGE) ×2 IMPLANT
TOWEL OR 17X24 6PK STRL BLUE (TOWEL DISPOSABLE) ×3 IMPLANT
TOWEL OR NON WOVEN STRL DISP B (DISPOSABLE) ×1 IMPLANT
TUBE CONNECTING 20X1/4 (TUBING) IMPLANT
YANKAUER SUCT BULB TIP NO VENT (SUCTIONS) IMPLANT

## 2014-06-01 NOTE — Telephone Encounter (Signed)
Called pt x1 left message regarding referral for Lympedema clinic, waiting for return call

## 2014-06-01 NOTE — Anesthesia Preprocedure Evaluation (Signed)
Anesthesia Evaluation  Patient identified by MRN, date of birth, ID band Patient awake    Reviewed: Allergy & Precautions, H&P , NPO status , Patient's Chart, lab work & pertinent test results  Airway Mallampati: II  TM Distance: >3 FB Neck ROM: Full    Dental  (+) Teeth Intact, Dental Advisory Given   Pulmonary neg pulmonary ROS,    Pulmonary exam normal       Cardiovascular hypertension, Pt. on medications and Pt. on home beta blockers     Neuro/Psych Depression negative neurological ROS     GI/Hepatic negative GI ROS, Neg liver ROS,   Endo/Other  negative endocrine ROS  Renal/GU negative Renal ROS     Musculoskeletal negative musculoskeletal ROS (+)   Abdominal   Peds  Hematology negative hematology ROS (+)   Anesthesia Other Findings   Reproductive/Obstetrics                             Anesthesia Physical  Anesthesia Plan  ASA: II  Anesthesia Plan: General   Post-op Pain Management:    Induction: Intravenous  Airway Management Planned: LMA  Additional Equipment:   Intra-op Plan:   Post-operative Plan: Extubation in OR  Informed Consent: I have reviewed the patients History and Physical, chart, labs and discussed the procedure including the risks, benefits and alternatives for the proposed anesthesia with the patient or authorized representative who has indicated his/her understanding and acceptance.   Dental advisory given  Plan Discussed with: CRNA, Surgeon and Anesthesiologist  Anesthesia Plan Comments:         Anesthesia Quick Evaluation

## 2014-06-01 NOTE — Anesthesia Procedure Notes (Signed)
Procedure Name: LMA Insertion Date/Time: 06/01/2014 2:10 PM Performed by: Calob Baskette Pre-anesthesia Checklist: Patient identified, Emergency Drugs available, Suction available and Patient being monitored Patient Re-evaluated:Patient Re-evaluated prior to inductionOxygen Delivery Method: Circle System Utilized Preoxygenation: Pre-oxygenation with 100% oxygen Intubation Type: IV induction Ventilation: Mask ventilation without difficulty LMA: LMA inserted LMA Size: 4.0 Number of attempts: 1 Airway Equipment and Method: bite block Placement Confirmation: positive ETCO2 Tube secured with: Tape Dental Injury: Teeth and Oropharynx as per pre-operative assessment

## 2014-06-01 NOTE — Interval H&P Note (Signed)
History and Physical Interval Note:  06/01/2014 1:58 PM  Mckenzie Sanford  has presented today for surgery, with the diagnosis of BREAST CANCER  The various methods of treatment have been discussed with the patient and family. After consideration of risks, benefits and other options for treatment, the patient has consented to  Procedure(s): ULTRASOUND GUIDED INSERTION PORT-A-CATH (N/A) as a surgical intervention .  The patient's history has been reviewed, patient examined, no change in status, stable for surgery.  I have reviewed the patient's chart and labs.  Questions were answered to the patient's satisfaction.     Pippa Hanif Lenna Sciara

## 2014-06-01 NOTE — Anesthesia Postprocedure Evaluation (Signed)
Anesthesia Post Note  Patient: Mckenzie Sanford  Procedure(s) Performed: Procedure(s) (LRB): ULTRASOUND GUIDED INSERTION PORT-A-CATH (Right)  Anesthesia type: general  Patient location: PACU  Post pain: Pain level controlled  Post assessment: Patient's Cardiovascular Status Stable  Last Vitals:  Filed Vitals:   06/01/14 1534  BP:   Pulse: 67  Temp:   Resp: 12    Post vital signs: Reviewed and stable  Level of consciousness: sedated  Complications: No apparent anesthesia complications

## 2014-06-01 NOTE — Op Note (Signed)
Preoperative diagnosis:  Invasive breast cancer  Postoperative diagnosis:  Same  Procedure: Ultrasound-guided Port-A-Cath insertion into right internal jugular vein under fluoroscopy.  Surgeon: [name] M.D.  Anesthesia: Local (Xylocaine) with General/LMA.  Indication:  This is a 59 year old female with T2N1 breast cancer who needs long term venous access for chemotherapy.  Technique: She was brought to the operating room, placed supine on the operating table, and intravenous sedation was given. A roll was placed under the back and the arms were tucked. The upper chest wall and neck were sterilely prepped and draped.  Her head was rotated to the right. Using the ultrasound, the right internal jugular vein was identified. Local anesthetic was infiltrated in the skin and subcutaneous tissue just anterior to it. A 16-gauge needle was used to cannulate the right internal jugular vein under ultrasound guidance. A wire was then threaded through the needle into the internal jugular vein and down into the right heart under ultrasound and fluoroscopic guidance.  Local anesthetic was infiltrated into the right upper chest wall. A right upper chest wall incision was made and a pocket was created for the Portacath.  An incision was made around the wire in the neck. The catheter was then tunneled from the chest wall incision up through the neck incision.  A dilator- introducer complex was placed over the wire into the superior vena cava. The dilator and wire were then removed and the catheter was threaded through the peel-away sheath introducer into the right heart. The introducer was then peeled away and removed. Under fluoroscopic guidance, the tip of the catheter was then pulled back until it was at the junction of the superior vena cava and right atrium. The catheter was then connected to the port.  The port aspirated blood and flushed easily.  The port was then anchored to the chest wall with 2-0  Vicryl suture. Concentrated heparin solution was then placed into the port. The port and catheter position were then verified using fluoroscopy. The subcutaneous tissue was then closed over the port with running 3-0 Vicryl suture. The skin incisions were then closed with 4-0 Monocryl subcuticular stitches. Steri-Strips and sterile dressings were applied.  The procedure was well-tolerated without any apparent complications. She was taken to the recovery room in satisfactory condition where a portable chest x-ray is pending.

## 2014-06-01 NOTE — H&P (Signed)
Note:She had a right partial mastectomy and sentinel lymph node biopsy for invasive right breast cancer.  Pathology demonstrates a T2 N1 lesion, ER/PR positive, with one out of 3 sentinel lymph nodes being positive. She has seen Dr. [name] and chemotherapy is recommended followed by radiation and then hormonal therapy.  Other Problems Davy Pique Bynum, CMA; 04/29/2014 10:09 AM) Anxiety Disorder Back Pain Breast Cancer High blood pressure  Past Surgical History Marjean Donna, CMA; 04/29/2014 10:09 AM) Breast Biopsy Right. Colon Polyp Removal - Colonoscopy Oral Surgery  Diagnostic Studies History Marjean Donna, CMA; 04/29/2014 10:09 AM) Colonoscopy 5-10 years ago Pap Smear 1-5 years ago  Allergies Marjean Donna, CMA; 04/29/2014 10:11 AM) Penicillamine *ASSORTED CLASSES*  Medication History (Sonya Bynum, CMA; 04/29/2014 10:11 AM) AmLODIPine Besylate (10MG  Tablet, Oral daily) Active. Metoprolol Succinate ER (50MG  Tablet ER 24HR, Oral daily) Active. Sertraline HCl (25MG  Tablet, Oral daily) Active. Simvastatin (40MG  Tablet, Oral daily) Active. Linzess (290MCG Capsule, Oral daily) Active. Irbesartan (150MG  Tablet, Oral daily) Active.  Social History Marjean Donna, [place]; 04/29/2014 10:09 AM) Alcohol use Moderate alcohol use. Caffeine use Tea. No drug use Tobacco use Never smoker.  Family History Marjean Donna, [place]; 04/29/2014 10:09 AM) Arthritis Sister. Heart disease in female family member before age 47 Kidney Disease Brother, Sister. Seizure disorder Sister. Thyroid problems Sister.  Pregnancy / Birth History Marjean Donna, [place]; 04/29/2014 10:09 AM) Age at menarche 68 years. Age of menopause 46-55 Gravida 0 Irregular periods     Review of Systems ([place]; 04/29/2014 10:09 AM) General Not Present- Appetite Loss, Chills, Fatigue, Fever, Night Sweats, Weight Gain and Weight Loss. Skin Not Present- Change in Wart/Mole, Dryness, Hives, Jaundice, New  Lesions, Non-Healing Wounds, Rash and Ulcer. HEENT Present- Wears glasses/contact lenses. Not Present- Earache, Hearing Loss, Hoarseness, Nose Bleed, Oral Ulcers, Ringing in the Ears, Seasonal Allergies, Sinus Pain, Sore Throat, Visual Disturbances and Yellow Eyes. Breast Present- Breast Mass. Not Present- Breast Pain, Nipple Discharge and Skin Changes. Cardiovascular Not Present- Chest Pain, Difficulty Breathing Lying Down, Leg Cramps, Palpitations, Rapid Heart Rate, Shortness of Breath and Swelling of Extremities. Gastrointestinal Present- Bloating, Constipation and Gets full quickly at meals. Not Present- Abdominal Pain, Bloody Stool, Change in Bowel Habits, Chronic diarrhea, Difficulty Swallowing, Excessive gas, Hemorrhoids, Indigestion, Nausea, Rectal Pain and Vomiting. Female Genitourinary Not Present- Frequency, Nocturia, Painful Urination, Pelvic Pain and Urgency. Musculoskeletal Present- Back Pain. Not Present- Joint Pain, Joint Stiffness, Muscle Pain, Muscle Weakness and Swelling of Extremities. Neurological Present- Tingling. Not Present- Decreased Memory, Fainting, Headaches, Numbness, Seizures, Tremor, Trouble walking and Weakness. Psychiatric Present- Anxiety. Not Present- Bipolar, Change in Sleep Pattern, Depression, Fearful and Frequent crying. Endocrine Present- Hot flashes. Not Present- Cold Intolerance, Excessive Hunger, Hair Changes, Heat Intolerance and New Diabetes. Hematology Not Present- Easy Bruising, Excessive bleeding, Gland problems, HIV and Persistent Infections.  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 Still quite sore but is healing well. She will require a Port-A-Cath for chemotherapy.  Plan: Ultrasound-guided Port-A-Cath insertion. Return visit 6 weeks. The procedure risks and aftercare been explained. Risks include but are not  limited to bleeding, infection, malfunction, pneumothorax, wound problems, DVT.  [name], MD

## 2014-06-01 NOTE — Transfer of Care (Signed)
Immediate Anesthesia Transfer of Care Note  Patient: Mckenzie Sanford  Procedure(s) Performed: Procedure(s): ULTRASOUND GUIDED INSERTION PORT-A-CATH (Right)  Patient Location: PACU  Anesthesia Type:General  Level of Consciousness: awake and patient cooperative  Airway & Oxygen Therapy: Patient Spontanous Breathing and Patient connected to face mask oxygen  Post-op Assessment: Report given to PACU RN and Post -op Vital signs reviewed and stable  Post vital signs: Reviewed and stable  Complications: No apparent anesthesia complications

## 2014-06-01 NOTE — Discharge Instructions (Addendum)
PORT-A-CATH: POST OP INSTRUCTIONS  Always review your discharge instruction sheet given to you by the facility where your surgery was performed.   1. A prescription for pain medication may be given to you upon discharge. Take your pain medication as prescribed, if needed. If narcotic pain medicine is not needed, then you make take acetaminophen (Tylenol) or ibuprofen (Advil) as needed.  2. Take your usually prescribed medications unless otherwise directed. 3. If you need a refill on your pain medication, please contact our office. All narcotic pain medicine now requires a paper prescription.  Phoned in and fax refills are no longer allowed by law.  Prescriptions will not be filled after 5 pm or on weekends.  4. You should follow a light diet for the remainder of the day after your procedure. 5. Most patients will experience some mild swelling and/or bruising in the area of the incision. It may take several days to resolve. 6. It is common to experience some constipation if taking pain medication after surgery. Increasing fluid intake and taking a stool softener (such as Colace) will usually help or prevent this problem from occurring. A mild laxative (Milk of Magnesia or Miralax) should be taken according to package directions if there are no bowel movements after 48 hours.  7. Unless discharge instructions indicate otherwise, you may remove your bandages 72 hours after surgery, and you may shower at that time. You may have steri-strips (small white skin tapes) in place directly over the incision.  These strips should be left on the skin for 7-10 days.  If your surgeon used Dermabond (skin glue) on the incision, you may shower in 24 hours.  The glue will flake off over the next 2-3 weeks.  8. If your port is left accessed at the end of surgery (needle left in port), the dressing cannot get wet and should only by changed by a healthcare professional. When the port is no longer accessed (when the  needle has been removed), follow step 7.   9. ACTIVITIES:  Limit activity involving your arms for the next 72 hours. Do no strenuous exercise or activity for 1 week. You may drive when you are no longer taking prescription pain medication, you can comfortably wear a seatbelt, and you can maneuver your car. 10.You may need to see your doctor in the office for a follow-up appointment.  Please       check with your doctor.  11.When you receive a new Port-a-Cath, you will get a product guide and        ID card.  Please keep them in case you need them.  WHEN TO CALL YOUR DOCTOR [phone]): 1. Fever over 101.0 2. Chills 3. Continued bleeding from incision 4. Increased redness and tenderness at the site 5. Shortness of breath, difficulty breathing   The clinic staff is available to answer your questions during regular business hours. Please dont hesitate to call and ask to speak to one of the nurses or medical assistants for clinical concerns. If you have a medical emergency, go to the nearest emergency room or call 911.  A surgeon from [facility] is always on call at the hospital.     For further information, please visit [email or web address]    Post Anesthesia Home Care Instructions  Activity: Get plenty of rest for the remainder of the day. A responsible adult should stay with you for 24 hours following the procedure.  For the next 24 hours, DO NOT: -Drive a  car -Paediatric nurse -Drink alcoholic beverages -Take any medication unless instructed by your physician -Make any legal decisions or sign important papers.  Meals: Start with liquid foods such as gelatin or soup. Progress to regular foods as tolerated. Avoid greasy, spicy, heavy foods. If nausea and/or vomiting occur, drink only clear liquids until the nausea and/or vomiting subsides. Call your physician if vomiting continues.  Special Instructions/Symptoms: Your throat may feel dry or sore from  the anesthesia or the breathing tube placed in your throat during surgery. If this causes discomfort, gargle with warm salt water. The discomfort should disappear within 24 hours.

## 2014-06-02 ENCOUNTER — Encounter (HOSPITAL_BASED_OUTPATIENT_CLINIC_OR_DEPARTMENT_OTHER): Payer: Self-pay | Admitting: General Surgery

## 2014-06-02 ENCOUNTER — Other Ambulatory Visit: Payer: Self-pay | Admitting: *Deleted

## 2014-06-02 ENCOUNTER — Telehealth: Payer: Self-pay | Admitting: Physical Therapy

## 2014-06-02 NOTE — Telephone Encounter (Signed)
Called pt again today left message regarding scheduling appt with [facility] clinic

## 2014-06-03 ENCOUNTER — Telehealth: Payer: Self-pay | Admitting: *Deleted

## 2014-06-03 NOTE — Telephone Encounter (Signed)
Per pharmacy I have scheduled appt 

## 2014-06-04 ENCOUNTER — Other Ambulatory Visit: Payer: 59

## 2014-06-04 ENCOUNTER — Ambulatory Visit: Payer: 59 | Admitting: Hematology and Oncology

## 2014-06-04 ENCOUNTER — Ambulatory Visit: Payer: 59

## 2014-06-04 ENCOUNTER — Telehealth: Payer: Self-pay | Admitting: Hematology and Oncology

## 2014-06-04 ENCOUNTER — Encounter: Payer: Self-pay | Admitting: *Deleted

## 2014-06-04 ENCOUNTER — Ambulatory Visit (HOSPITAL_BASED_OUTPATIENT_CLINIC_OR_DEPARTMENT_OTHER): Payer: Medicare Other

## 2014-06-04 ENCOUNTER — Ambulatory Visit (HOSPITAL_BASED_OUTPATIENT_CLINIC_OR_DEPARTMENT_OTHER): Payer: Medicare Other | Admitting: Hematology and Oncology

## 2014-06-04 ENCOUNTER — Other Ambulatory Visit (HOSPITAL_BASED_OUTPATIENT_CLINIC_OR_DEPARTMENT_OTHER): Payer: Medicare Other

## 2014-06-04 DIAGNOSIS — C50411 Malignant neoplasm of upper-outer quadrant of right female breast: Secondary | ICD-10-CM

## 2014-06-04 DIAGNOSIS — Z5111 Encounter for antineoplastic chemotherapy: Secondary | ICD-10-CM

## 2014-06-04 LAB — CBC WITH DIFFERENTIAL/PLATELET
BASO%: 1.7 % (ref 0.0–2.0)
BASOS ABS: 0.1 10*3/uL (ref 0.0–0.1)
EOS%: 2.2 % (ref 0.0–7.0)
Eosinophils Absolute: 0.1 10*3/uL (ref 0.0–0.5)
HCT: 41.5 % (ref 34.8–46.6)
HGB: 13.5 g/dL (ref 11.6–15.9)
LYMPH#: 2.5 10*3/uL (ref 0.9–3.3)
LYMPH%: 45.9 % (ref 14.0–49.7)
MCH: 29 pg (ref 25.1–34.0)
MCHC: 32.6 g/dL (ref 31.5–36.0)
MCV: 88.8 fL (ref 79.5–101.0)
MONO#: 0.7 10*3/uL (ref 0.1–0.9)
MONO%: 13 % (ref 0.0–14.0)
NEUT#: 2 10*3/uL (ref 1.5–6.5)
NEUT%: 37.2 % — ABNORMAL LOW (ref 38.4–76.8)
Platelets: 334 10*3/uL (ref 145–400)
RBC: 4.67 10*6/uL (ref 3.70–5.45)
RDW: 12.6 % (ref 11.2–14.5)
WBC: 5.5 10*3/uL (ref 3.9–10.3)

## 2014-06-04 LAB — COMPREHENSIVE METABOLIC PANEL (CC13)
ALT: 20 U/L (ref 0–55)
AST: 16 U/L (ref 5–34)
Albumin: 3.9 g/dL (ref 3.5–5.0)
Alkaline Phosphatase: 138 U/L (ref 40–150)
Anion Gap: 8 mEq/L (ref 3–11)
BUN: 10.8 mg/dL (ref 7.0–26.0)
CHLORIDE: 102 meq/L (ref 98–109)
CO2: 31 mEq/L — ABNORMAL HIGH (ref 22–29)
Calcium: 9.8 mg/dL (ref 8.4–10.4)
Creatinine: 0.8 mg/dL (ref 0.6–1.1)
Glucose: 83 mg/dl (ref 70–140)
POTASSIUM: 3.4 meq/L — AB (ref 3.5–5.1)
Sodium: 141 mEq/L (ref 136–145)
TOTAL PROTEIN: 7.1 g/dL (ref 6.4–8.3)
Total Bilirubin: 0.37 mg/dL (ref 0.20–1.20)

## 2014-06-04 MED ORDER — PALONOSETRON HCL INJECTION 0.25 MG/5ML
0.2500 mg | Freq: Once | INTRAVENOUS | Status: AC
  Administered 2014-06-04: 0.25 mg via INTRAVENOUS

## 2014-06-04 MED ORDER — SODIUM CHLORIDE 0.9 % IJ SOLN
10.0000 mL | INTRAMUSCULAR | Status: DC | PRN
  Administered 2014-06-04: 10 mL
  Filled 2014-06-04: qty 10

## 2014-06-04 MED ORDER — HEPARIN SOD (PORK) LOCK FLUSH 100 UNIT/ML IV SOLN
500.0000 [IU] | Freq: Once | INTRAVENOUS | Status: AC | PRN
  Administered 2014-06-04: 500 [IU]
  Filled 2014-06-04: qty 5

## 2014-06-04 MED ORDER — DEXAMETHASONE SODIUM PHOSPHATE 20 MG/5ML IJ SOLN
12.0000 mg | Freq: Once | INTRAMUSCULAR | Status: AC
  Administered 2014-06-04: 12 mg via INTRAVENOUS

## 2014-06-04 MED ORDER — SODIUM CHLORIDE 0.9 % IV SOLN
Freq: Once | INTRAVENOUS | Status: AC
  Administered 2014-06-04: 12:00:00 via INTRAVENOUS

## 2014-06-04 MED ORDER — DOXORUBICIN HCL CHEMO IV INJECTION 2 MG/ML
60.0000 mg/m2 | Freq: Once | INTRAVENOUS | Status: AC
  Administered 2014-06-04: 96 mg via INTRAVENOUS
  Filled 2014-06-04: qty 48

## 2014-06-04 MED ORDER — DEXAMETHASONE SODIUM PHOSPHATE 20 MG/5ML IJ SOLN
INTRAMUSCULAR | Status: AC
  Filled 2014-06-04: qty 5

## 2014-06-04 MED ORDER — SODIUM CHLORIDE 0.9 % IV SOLN
150.0000 mg | Freq: Once | INTRAVENOUS | Status: AC
  Administered 2014-06-04: 150 mg via INTRAVENOUS
  Filled 2014-06-04: qty 5

## 2014-06-04 MED ORDER — PALONOSETRON HCL INJECTION 0.25 MG/5ML
INTRAVENOUS | Status: AC
  Filled 2014-06-04: qty 5

## 2014-06-04 MED ORDER — CYCLOPHOSPHAMIDE CHEMO INJECTION 1 GM
600.0000 mg/m2 | Freq: Once | INTRAMUSCULAR | Status: AC
  Administered 2014-06-04: 960 mg via INTRAVENOUS
  Filled 2014-06-04: qty 48

## 2014-06-04 NOTE — Progress Notes (Signed)
Saw pt prior to going to lab to apply emla cream for first treatment per pt request. Pt had port placed on 06/01/14. Dsg clean, dry and intact. Three steri strips in place over port. Removed middle strip to all emla cream to be placed. Incision without drainage, no redness noted. Informed pt to let remainder of strips fall off naturally.  She could remove other dsg when she went home after treatment. Received verbal understanding. Informed pt will see her after she was in infusion.

## 2014-06-04 NOTE — Telephone Encounter (Signed)
, °

## 2014-06-04 NOTE — Progress Notes (Signed)
Patient Care Team: Tivis Ringer, MD as PCP - General (Internal Medicine) [name], MD as Consulting Physician (General Surgery) Rulon Eisenmenger, MD as Consulting Physician (Hematology and Oncology) [name], MD as Consulting Physician (Radiation Oncology)  DIAGNOSIS: Breast cancer of upper-outer quadrant of right female breast   Staging form: Breast, AJCC 7th Edition     Clinical: Stage IA (T1b, N0, cM0) - Signed by Rulon Eisenmenger, MD on 03/20/2014     Pathologic: No stage assigned - Unsigned   SUMMARY OF ONCOLOGIC HISTORY:   Breast cancer of upper-outer quadrant of right female breast   03/12/2014 Initial Biopsy Invasive ductal carcinoma with DCIS, right axillary lymph node biopsy negative, ER percent, PR 84%, Ki-67 16%, HER-2 negative   03/20/2014 Breast MRI Right breast upper outer quadrant 2.6 x 1.9 x 1.7 cm invasive ductal carcinoma with extension and involvement of the adjacent skin and nipple area complex, no abnormal lymph nodes   04/20/2014 Surgery Right breast lumpectomy: IDC grade 2/32.5 cm with intermediate to high-grade DCIS margins negative: 1/3 positive sentinel lymph node with extracapsular extension, ER 100%, PR 84%, HER-2 negative ratio 1.22, Ki-67 16% T2, N1, M0 stage II B   06/04/2014 -  Chemotherapy Adjuvant chemotherapy dose dense Adriamycin and Cytoxan 4 followed by Taxol 12    CHIEF COMPLIANT: Cycle 1 day 1 dose dense Adriamycin Cytoxan  INTERVAL HISTORY: Mckenzie Sanford is a 59 year old lady with above-mentioned history of right-sided breast cancer treated with lumpectomy and is here today for starting adjuvant chemotherapy. She had chemotherapy education port placement echocardiogram and is here to start treatment. She denies any new problems or complaints. She is still sore in the breast from the recent surgery.  REVIEW OF SYSTEMS:   Constitutional: Denies fevers, chills or abnormal weight loss Eyes: Denies blurriness of vision Ears, nose,  mouth, throat, and face: Denies mucositis or sore throat Respiratory: Denies cough, dyspnea or wheezes Cardiovascular: Denies palpitation, chest discomfort or lower extremity swelling Gastrointestinal:  Denies nausea, heartburn or change in bowel habits Skin: Denies abnormal skin rashes Lymphatics: Denies new lymphadenopathy or easy bruising Neurological:Denies numbness, tingling or new weaknesses Behavioral/Psych: Mood is stable, no new changes  Breast: Soreness in the breast from recent surgery All other systems were reviewed with the patient and are negative.  I have reviewed the past medical history, past surgical history, social history and family history with the patient and they are unchanged from previous note.  ALLERGIES:  is allergic to shellfish allergy; ivp dye; and penicillins.  MEDICATIONS:  Current Outpatient Prescriptions  Medication Sig Dispense Refill  . amLODipine (NORVASC) 10 MG tablet Take 10 mg by mouth daily.    . Biotin 1000 MCG tablet Take 1,000 mcg by mouth 3 (three) times daily.    . cholecalciferol (VITAMIN D) 1000 UNITS tablet Take 1,000 Units by mouth daily.    . COD LIVER OIL PO Take by mouth.    . dexamethasone (DECADRON) 4 MG tablet Take 2 tablets by mouth once a day on the day after chemotherapy and then take 2 tablets two times a day for 2 days. Take with food. 30 tablet 1  . HYDROcodone-acetaminophen (NORCO/VICODIN) 5-325 MG per tablet Take 1-2 tablets by mouth every 4 (four) hours as needed for moderate pain or severe pain. 30 tablet 0  . ibuprofen (ADVIL,MOTRIN) 200 MG tablet Take 400 mg by mouth every 6 (six) hours as needed.    . irbesartan (AVAPRO) 150 MG tablet     .  lidocaine-prilocaine (EMLA) cream Apply to affected area once 30 g 3  . Linaclotide (LINZESS PO) Take by mouth.    Marland Kitchen LORazepam (ATIVAN) 0.5 MG tablet Take 1 tablet (0.5 mg total) by mouth every 6 (six) hours as needed (Nausea or vomiting). 30 tablet 0  . metoprolol succinate  (TOPROL-XL) 50 MG 24 hr tablet Take 50 mg by mouth daily. Take with or immediately following a meal.    . Multiple Vitamin (MULTIVITAMIN) tablet Take 1 tablet by mouth daily.    . ondansetron (ZOFRAN) 8 MG tablet Take 1 tablet (8 mg total) by mouth 2 (two) times daily as needed. Start on the third day after chemotherapy. 30 tablet 1  . prochlorperazine (COMPAZINE) 10 MG tablet Take 1 tablet (10 mg total) by mouth every 6 (six) hours as needed (Nausea or vomiting). 30 tablet 1  . sertraline (ZOLOFT) 25 MG tablet at bedtime.   11  . simvastatin (ZOCOR) 40 MG tablet Take 40 mg by mouth every evening.     No current facility-administered medications for this visit.    PHYSICAL EXAMINATION: ECOG PERFORMANCE STATUS: 1 - Symptomatic but completely ambulatory  Filed Vitals:   06/04/14 1038  BP: 132/82  Pulse: 57  Temp: 98 F (36.7 C)  Resp: 18   Filed Weights   06/04/14 1038  Weight: 130 lb 8 oz (59.194 kg)    GENERAL:alert, no distress and comfortable SKIN: skin color, texture, turgor are normal, no rashes or significant lesions EYES: normal, Conjunctiva are pink and non-injected, sclera clear OROPHARYNX:no exudate, no erythema and lips, buccal mucosa, and tongue normal  NECK: supple, thyroid normal size, non-tender, without nodularity LYMPH:  no palpable lymphadenopathy in the cervical, axillary or inguinal LUNGS: clear to auscultation and percussion with normal breathing effort HEART: regular rate & rhythm and no murmurs and no lower extremity edema ABDOMEN:abdomen soft, non-tender and normal bowel sounds Musculoskeletal:no cyanosis of digits and no clubbing  NEURO: alert & oriented x 3 with fluent speech, no focal motor/sensory deficits  LABORATORY DATA:  I have reviewed the data as listed   Chemistry      Component Value Date/Time   NA 141 06/04/2014 1021   NA 139 05/28/2014 1330   K 3.4* 06/04/2014 1021   K 4.3 05/28/2014 1330   CL 105 05/28/2014 1330   CO2 31*  06/04/2014 1021   CO2 28 05/28/2014 1330   BUN 10.8 06/04/2014 1021   BUN 11 05/28/2014 1330   CREATININE 0.8 06/04/2014 1021   CREATININE 0.75 05/28/2014 1330   CREATININE 0.72 12/11/2011 1238      Component Value Date/Time   CALCIUM 9.8 06/04/2014 1021   CALCIUM 9.6 05/28/2014 1330   ALKPHOS 138 06/04/2014 1021   ALKPHOS 134* 04/16/2014 1430   AST 16 06/04/2014 1021   AST 19 04/16/2014 1430   ALT 20 06/04/2014 1021   ALT 24 04/16/2014 1430   BILITOT 0.37 06/04/2014 1021   BILITOT <0.2* 04/16/2014 1430       Lab Results  Component Value Date   WBC 5.5 06/04/2014   HGB 13.5 06/04/2014   HCT 41.5 06/04/2014   MCV 88.8 06/04/2014   PLT 334 06/04/2014   NEUTROABS 2.0 06/04/2014     RADIOGRAPHIC STUDIES: I have personally reviewed the radiology reports and agreed with their findings. 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 final pathology revealed tumor size 2.5 cm  with DCIS one out of 3 positive sentinel lymph nodes with extracapsular extension, ER 100%, PR 84%, HER-2 negative, Ki-67 16%, T2a N1 M0 stage IIB  Current treatment:Adjuvant systemic chemotherapy with dose dense Adriamycin and Cytoxan x4 cycles followed by weekly Taxol x12 today cycle 1 day 1 of dose dense Adriamycin and Cytoxan  Chemotherapy monitoring:  Echocardiogram showed an ejection fraction of 55-60% Chemotherapy education has been complete I reviewed all her antiemetics in great detail Her blood counts today were reviewed and the right equipment for treatment Patient signed a consent form and is ready to start treatment. Return to clinic in 1 week for toxicity check and follow-up      Orders Placed This Encounter  Procedures  . CBC with Differential    Standing Status: Future     Number of Occurrences:      Standing Expiration Date: 06/04/2015  . Comprehensive metabolic panel  (Cmet) - [facility]    Standing Status: Future     Number of Occurrences:      Standing Expiration Date: 06/04/2015   The patient has a good understanding of the overall plan. she agrees with it. She will call with any problems that may develop before her next visit here.   Rulon Eisenmenger, MD 06/04/2014 11:21 AM

## 2014-06-04 NOTE — Progress Notes (Signed)
TTE results dtd 05/19/14 reviewed by Dr. [name].  Sent to scan.

## 2014-06-04 NOTE — Telephone Encounter (Signed)
cld pt & left message of update inj time to 3:15 1/8

## 2014-06-04 NOTE — Patient Instructions (Addendum)
[facility] Discharge Instructions for Patients Receiving Chemotherapy  Today you received the following chemotherapy agents: Adriamycin and Cytoxan.  To help prevent nausea and vomiting after your treatment, we encourage you to take your nausea medication as prescribed.   If you develop nausea and vomiting that is not controlled by your nausea medication, call the clinic.   BELOW ARE SYMPTOMS THAT SHOULD BE REPORTED IMMEDIATELY:  *FEVER GREATER THAN 100.5 F  *CHILLS WITH OR WITHOUT FEVER  NAUSEA AND VOMITING THAT IS NOT CONTROLLED WITH YOUR NAUSEA MEDICATION  *UNUSUAL SHORTNESS OF BREATH  *UNUSUAL BRUISING OR BLEEDING  TENDERNESS IN MOUTH AND THROAT WITH OR WITHOUT PRESENCE OF ULCERS  *URINARY PROBLEMS  *BOWEL PROBLEMS  UNUSUAL RASH Items with * indicate a potential emergency and should be followed up as soon as possible.  Feel free to call the clinic you have any questions or concerns. The clinic phone number is [phone].   Cyclophosphamide injection What is this medicine? CYCLOPHOSPHAMIDE (sye kloe FOSS fa mide) is a chemotherapy drug. It slows the growth of cancer cells. This medicine is used to treat many types of cancer like lymphoma, myeloma, leukemia, breast cancer, and ovarian cancer, to name a few. This medicine may be used for other purposes; ask your health care provider or pharmacist if you have questions. COMMON BRAND NAME(S): Cytoxan, Neosar What should I tell my health care provider before I take this medicine? They need to know if you have any of these conditions: -blood disorders -history of other chemotherapy -infection -kidney disease -liver disease -recent or ongoing radiation therapy -tumors in the bone marrow -an unusual or allergic reaction to cyclophosphamide, other chemotherapy, other medicines, foods, dyes, or preservatives -pregnant or trying to get pregnant -breast-feeding How should I use this medicine? This drug is  usually given as an injection into a vein or muscle or by infusion into a vein. It is administered in a hospital or clinic by a specially trained health care professional. Talk to your pediatrician regarding the use of this medicine in children. Special care may be needed. Overdosage: If you think you have taken too much of this medicine contact a poison control center or emergency room at once. NOTE: This medicine is only for you. Do not share this medicine with others. What if I miss a dose? It is important not to miss your dose. Call your doctor or health care professional if you are unable to keep an appointment. What may interact with this medicine? This medicine may interact with the following medications: -amiodarone -amphotericin B -azathioprine -certain antiviral medicines for HIV or AIDS such as protease inhibitors (e.g., indinavir, ritonavir) and zidovudine -certain blood pressure medications such as benazepril, captopril, enalapril, fosinopril, lisinopril, moexipril, monopril, perindopril, quinapril, ramipril, trandolapril -certain cancer medications such as anthracyclines (e.g., daunorubicin, doxorubicin), busulfan, cytarabine, paclitaxel, pentostatin, tamoxifen, trastuzumab -certain diuretics such as chlorothiazide, chlorthalidone, hydrochlorothiazide, indapamide, metolazone -certain medicines that treat or prevent blood clots like warfarin -certain muscle relaxants such as succinylcholine -cyclosporine -etanercept -indomethacin -medicines to increase blood counts like filgrastim, pegfilgrastim, sargramostim -medicines used as general anesthesia -metronidazole -natalizumab This list may not describe all possible interactions. Give your health care provider a list of all the medicines, herbs, non-prescription drugs, or dietary supplements you use. Also tell them if you smoke, drink alcohol, or use illegal drugs. Some items may interact with your medicine. What should I watch for  while using this medicine? Visit your doctor for checks on your progress. This drug may  make you feel generally unwell. This is not uncommon, as chemotherapy can affect healthy cells as well as cancer cells. Report any side effects. Continue your course of treatment even though you feel ill unless your doctor tells you to stop. Drink water or other fluids as directed. Urinate often, even at night. In some cases, you may be given additional medicines to help with side effects. Follow all directions for their use. Call your doctor or health care professional for advice if you get a fever, chills or sore throat, or other symptoms of a cold or flu. Do not treat yourself. This drug decreases your body's ability to fight infections. Try to avoid being around people who are sick. This medicine may increase your risk to bruise or bleed. Call your doctor or health care professional if you notice any unusual bleeding. Be careful brushing and flossing your teeth or using a toothpick because you may get an infection or bleed more easily. If you have any dental work done, tell your dentist you are receiving this medicine. You may get drowsy or dizzy. Do not drive, use machinery, or do anything that needs mental alertness until you know how this medicine affects you. Do not become pregnant while taking this medicine or for 1 year after stopping it. Women should inform their doctor if they wish to become pregnant or think they might be pregnant. Men should not father a child while taking this medicine and for 4 months after stopping it. There is a potential for serious side effects to an unborn child. Talk to your health care professional or pharmacist for more information. Do not breast-feed an infant while taking this medicine. This medicine may interfere with the ability to have a child. This medicine has caused ovarian failure in some women. This medicine has caused reduced sperm counts in some men. You should talk  with your doctor or health care professional if you are concerned about your fertility. If you are going to have surgery, tell your doctor or health care professional that you have taken this medicine. What side effects may I notice from receiving this medicine? Side effects that you should report to your doctor or health care professional as soon as possible: -allergic reactions like skin rash, itching or hives, swelling of the face, lips, or tongue -low blood counts - this medicine may decrease the number of white blood cells, red blood cells and platelets. You may be at increased risk for infections and bleeding. -signs of infection - fever or chills, cough, sore throat, pain or difficulty passing urine -signs of decreased platelets or bleeding - bruising, pinpoint red spots on the skin, black, tarry stools, blood in the urine -signs of decreased red blood cells - unusually weak or tired, fainting spells, lightheadedness -breathing problems -dark urine -dizziness -palpitations -swelling of the ankles, feet, hands -trouble passing urine or change in the amount of urine -weight gain -yellowing of the eyes or skin Side effects that usually do not require medical attention (report to your doctor or health care professional if they continue or are bothersome): -changes in nail or skin color -hair loss -missed menstrual periods -mouth sores -nausea, vomiting This list may not describe all possible side effects. Call your doctor for medical advice about side effects. You may report side effects to FDA at 1-800-FDA-1088. Where should I keep my medicine? This drug is given in a hospital or clinic and will not be stored at home. NOTE: This sheet is a summary. It  may not cover all possible information. If you have questions about this medicine, talk to your doctor, pharmacist, or health care provider.  2015, Elsevier/[employer] Standard. (2012-03-29 16:22:58) Doxorubicin injection What is this  medicine? DOXORUBICIN (dox oh ROO bi sin) is a chemotherapy drug. It is used to treat many kinds of cancer like Hodgkin's disease, leukemia, non-Hodgkin's lymphoma, neuroblastoma, sarcoma, and Wilms' tumor. It is also used to treat bladder cancer, breast cancer, lung cancer, ovarian cancer, stomach cancer, and thyroid cancer. This medicine may be used for other purposes; ask your health care provider or pharmacist if you have questions. COMMON BRAND NAME(S): Adriamycin, Adriamycin PFS, Adriamycin RDF, Rubex What should I tell my health care provider before I take this medicine? They need to know if you have any of these conditions: -blood disorders -heart disease, recent heart attack -infection (especially a virus infection such as chickenpox, cold sores, or herpes) -irregular heartbeat -liver disease -recent or ongoing radiation therapy -an unusual or allergic reaction to doxorubicin, other chemotherapy agents, other medicines, foods, dyes, or preservatives -pregnant or trying to get pregnant -breast-feeding How should I use this medicine? This drug is given as an infusion into a vein. It is administered in a hospital or clinic by a specially trained health care professional. If you have pain, swelling, burning or any unusual feeling around the site of your injection, tell your health care professional right away. Talk to your pediatrician regarding the use of this medicine in children. Special care may be needed. Overdosage: If you think you have taken too much of this medicine contact a poison control center or emergency room at once. NOTE: This medicine is only for you. Do not share this medicine with others. What if I miss a dose? It is important not to miss your dose. Call your doctor or health care professional if you are unable to keep an appointment. What may interact with this medicine? Do not take this medicine with any of the following  medications: -cisapride -droperidol -halofantrine -pimozide -zidovudine This medicine may also interact with the following medications: -chloroquine -chlorpromazine -clarithromycin -cyclophosphamide -cyclosporine -erythromycin -medicines for depression, anxiety, or psychotic disturbances -medicines for irregular heart beat like amiodarone, bepridil, dofetilide, encainide, flecainide, propafenone, quinidine -medicines for seizures like ethotoin, fosphenytoin, phenytoin -medicines for nausea, vomiting like dolasetron, ondansetron, palonosetron -medicines to increase blood counts like filgrastim, pegfilgrastim, sargramostim -methadone -methotrexate -pentamidine -progesterone -vaccines -verapamil Talk to your doctor or health care professional before taking any of these medicines: -acetaminophen -aspirin -ibuprofen -ketoprofen -naproxen This list may not describe all possible interactions. Give your health care provider a list of all the medicines, herbs, non-prescription drugs, or dietary supplements you use. Also tell them if you smoke, drink alcohol, or use illegal drugs. Some items may interact with your medicine. What should I watch for while using this medicine? Your condition will be monitored carefully while you are receiving this medicine. You will need important blood work done while you are taking this medicine. This drug may make you feel generally unwell. This is not uncommon, as chemotherapy can affect healthy cells as well as cancer cells. Report any side effects. Continue your course of treatment even though you feel ill unless your doctor tells you to stop. Your urine may turn red for a few days after your dose. This is not blood. If your urine is dark or brown, call your doctor. In some cases, you may be given additional medicines to help with side effects. Follow all directions for their use.  Call your doctor or health care professional for advice if you get a  fever, chills or sore throat, or other symptoms of a cold or flu. Do not treat yourself. This drug decreases your body's ability to fight infections. Try to avoid being around people who are sick. This medicine may increase your risk to bruise or bleed. Call your doctor or health care professional if you notice any unusual bleeding. Be careful brushing and flossing your teeth or using a toothpick because you may get an infection or bleed more easily. If you have any dental work done, tell your dentist you are receiving this medicine. Avoid taking products that contain aspirin, acetaminophen, ibuprofen, naproxen, or ketoprofen unless instructed by your doctor. These medicines may hide a fever. Men and women of childbearing age should use effective birth control methods while using taking this medicine. Do not become pregnant while taking this medicine. There is a potential for serious side effects to an unborn child. Talk to your health care professional or pharmacist for more information. Do not breast-feed an infant while taking this medicine. Do not let others touch your urine or other body fluids for 5 days after each treatment with this medicine. Caregivers should wear latex gloves to avoid touching body fluids during this time. There is a maximum amount of this medicine you should receive throughout your life. The amount depends on the medical condition being treated and your overall health. Your doctor will watch how much of this medicine you receive in your lifetime. Tell your doctor if you have taken this medicine before. What side effects may I notice from receiving this medicine? Side effects that you should report to your doctor or health care professional as soon as possible: -allergic reactions like skin rash, itching or hives, swelling of the face, lips, or tongue -low blood counts - this medicine may decrease the number of white blood cells, red blood cells and platelets. You may be at  increased risk for infections and bleeding. -signs of infection - fever or chills, cough, sore throat, pain or difficulty passing urine -signs of decreased platelets or bleeding - bruising, pinpoint red spots on the skin, black, tarry stools, blood in the urine -signs of decreased red blood cells - unusually weak or tired, fainting spells, lightheadedness -breathing problems -chest pain -fast, irregular heartbeat -mouth sores -nausea, vomiting -pain, swelling, redness at site where injected -pain, tingling, numbness in the hands or feet -swelling of ankles, feet, or hands -unusual bleeding or bruising Side effects that usually do not require medical attention (report to your doctor or health care professional if they continue or are bothersome): -diarrhea -facial flushing -hair loss -loss of appetite -missed menstrual periods -nail discoloration or damage -red or watery eyes -red colored urine -stomach upset This list may not describe all possible side effects. Call your doctor for medical advice about side effects. You may report side effects to FDA at 1-800-FDA-1088. Where should I keep my medicine? This drug is given in a hospital or clinic and will not be stored at home. NOTE: This sheet is a summary. It may not cover all possible information. If you have questions about this medicine, talk to your doctor, pharmacist, or health care provider.  2015, Elsevier/Gold Standard. (2012-09-10 09:54:34)

## 2014-06-04 NOTE — Assessment & Plan Note (Signed)
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 sentinel lymph nodes with extracapsular extension, ER 100%, PR 84%, HER-2 negative, Ki-67 16%, T2a N1 M0 stage IIB  Current treatment:Adjuvant systemic chemotherapy with dose dense Adriamycin and Cytoxan x4 cycles followed by weekly Taxol x12 today cycle 1 day 1 of dose dense Adriamycin and Cytoxan  Chemotherapy monitoring:  Echocardiogram showed an ejection fraction of 55-60% Chemotherapy education has been complete I reviewed all her antiemetics in great detail Her blood counts today were reviewed and the right equipment for treatment Patient signed a consent form and is ready to start treatment. Return to clinic in 1 week for toxicity check and follow-up

## 2014-06-05 ENCOUNTER — Telehealth: Payer: Self-pay | Admitting: *Deleted

## 2014-06-05 ENCOUNTER — Ambulatory Visit (HOSPITAL_BASED_OUTPATIENT_CLINIC_OR_DEPARTMENT_OTHER): Payer: Medicare Other

## 2014-06-05 ENCOUNTER — Ambulatory Visit: Payer: Medicare Other

## 2014-06-05 DIAGNOSIS — C50411 Malignant neoplasm of upper-outer quadrant of right female breast: Secondary | ICD-10-CM

## 2014-06-05 DIAGNOSIS — Z5189 Encounter for other specified aftercare: Secondary | ICD-10-CM

## 2014-06-05 MED ORDER — PEGFILGRASTIM INJECTION 6 MG/0.6ML ~~LOC~~
6.0000 mg | PREFILLED_SYRINGE | Freq: Once | SUBCUTANEOUS | Status: AC
  Administered 2014-06-05: 6 mg via SUBCUTANEOUS
  Filled 2014-06-05: qty 0.6

## 2014-06-05 NOTE — Telephone Encounter (Signed)
-----   Message from Braulio Bosch, RN sent at 06/04/2014  2:28 PM EST ----- Regarding: Chemo Follow up Call First time Adriamycin and Cytoxan. Dr. [name].

## 2014-06-05 NOTE — Patient Instructions (Signed)
Pegfilgrastim injection What is this medicine? PEGFILGRASTIM (peg fil GRA stim) is a long-acting granulocyte colony-stimulating factor that stimulates the growth of neutrophils, a type of white blood cell important in the body's fight against infection. It is used to reduce the incidence of fever and infection in patients with certain types of cancer who are receiving chemotherapy that affects the bone marrow. This medicine may be used for other purposes; ask your health care provider or pharmacist if you have questions. COMMON BRAND NAME(S): Neulasta What should I tell my health care provider before I take this medicine? They need to know if you have any of these conditions: -latex allergy -ongoing radiation therapy -sickle cell disease -skin reactions to acrylic adhesives (On-Body Injector only) -an unusual or allergic reaction to pegfilgrastim, filgrastim, other medicines, foods, dyes, or preservatives -pregnant or trying to get pregnant -breast-feeding How should I use this medicine? This medicine is for injection under the skin. If you get this medicine at home, you will be taught how to prepare and give the pre-filled syringe or how to use the On-body Injector. Refer to the patient Instructions for Use for detailed instructions. Use exactly as directed. Take your medicine at regular intervals. Do not take your medicine more often than directed. It is important that you put your used needles and syringes in a special sharps container. Do not put them in a trash can. If you do not have a sharps container, call your pharmacist or healthcare provider to get one. Talk to your pediatrician regarding the use of this medicine in children. Special care may be needed. Overdosage: If you think you have taken too much of this medicine contact a poison control center or emergency room at once. NOTE: This medicine is only for you. Do not share this medicine with others. What if I miss a dose? It is  important not to miss your dose. Call your doctor or health care professional if you miss your dose. If you miss a dose due to an On-body Injector failure or leakage, a new dose should be administered as soon as possible using a single prefilled syringe for manual use. What may interact with this medicine? Interactions have not been studied. Give your health care provider a list of all the medicines, herbs, non-prescription drugs, or dietary supplements you use. Also tell them if you smoke, drink alcohol, or use illegal drugs. Some items may interact with your medicine. This list may not describe all possible interactions. Give your health care provider a list of all the medicines, herbs, non-prescription drugs, or dietary supplements you use. Also tell them if you smoke, drink alcohol, or use illegal drugs. Some items may interact with your medicine. What should I watch for while using this medicine? You may need blood work done while you are taking this medicine. If you are going to need a MRI, CT scan, or other procedure, tell your doctor that you are using this medicine (On-Body Injector only). What side effects may I notice from receiving this medicine? Side effects that you should report to your doctor or health care professional as soon as possible: -allergic reactions like skin rash, itching or hives, swelling of the face, lips, or tongue -dizziness -fever -pain, redness, or irritation at site where injected -pinpoint red spots on the skin -shortness of breath or breathing problems -stomach or side pain, or pain at the shoulder -swelling -tiredness -trouble passing urine Side effects that usually do not require medical attention (report to your doctor   or health care professional if they continue or are bothersome): -bone pain -muscle pain This list may not describe all possible side effects. Call your doctor for medical advice about side effects. You may report side effects to FDA at  1-800-FDA-1088. Where should I keep my medicine? Keep out of the reach of children. Store pre-filled syringes in a refrigerator between 2 and 8 degrees C (36 and 46 degrees F). Do not freeze. Keep in carton to protect from light. Throw away this medicine if it is left out of the refrigerator for more than 48 hours. Throw away any unused medicine after the expiration date. NOTE: This sheet is a summary. It may not cover all possible information. If you have questions about this medicine, talk to your doctor, pharmacist, or health care provider.  2015, Elsevier/Gold Standard. (2013-08-14 16:14:05)  

## 2014-06-05 NOTE — Telephone Encounter (Signed)
No adverse effect from treatment.

## 2014-06-10 ENCOUNTER — Encounter (INDEPENDENT_AMBULATORY_CARE_PROVIDER_SITE_OTHER): Payer: Self-pay | Admitting: General Surgery

## 2014-06-10 NOTE — Progress Notes (Signed)
Patient ID: Mckenzie Sanford, female   DOB: 20-Oct-1955, 59 y.o.   MRN: [id number]  Mckenzie Sanford 06/10/2014 10:06 AM Location: [facility] Patient #: [id number] DOB: Aug 20, 1955 Single / Language: Cleophus Molt / Race: Black or African American Female History of Present Illness Mckenzie Hollingshead MD; 06/10/2014 10:23 AM) Patient words: f/u.  The patient is a 59 year old female    Note:She is here for her second postoperative visit after right partial mastectomy and sentinel lymph node biopsy for invasive right breast cancer April 20, 2014. She is significantly less sore. Pathology demonstrated a T2 N1 lesion, ER/PR positive. She has started chemotherapy via the Port-a-cath that was placed 06/01/14.  Allergies (Mckenzie Sanford, CMA; 06/10/2014 10:07 AM) Penicillamine *ASSORTED CLASSES*  Medication History (Mckenzie Sanford, CMA; 06/10/2014 10:07 AM) Norco (5-325MG  Tablet, 1-2 Tablet Oral every four hours, as needed, Taken starting 04/29/2014) Active. AmLODIPine Besylate (10MG  Tablet, Oral daily) Active. Metoprolol Succinate ER (50MG  Tablet ER 24HR, Oral daily) Active. Sertraline HCl (25MG  Tablet, Oral daily) Active. Simvastatin (40MG  Tablet, Oral daily) Active. Linzess (290MCG Capsule, Oral daily) Active. Irbesartan (150MG  Tablet, Oral daily) Active.    Vitals (Mckenzie Sanford CMA; 06/10/2014 10:06 AM) 06/10/2014 10:06 AM Weight: 130 lb Height: 62in Body Surface Area: 1.61 m Body Mass Index: 23.78 kg/m Temp.: 14F(Temporal)  Pulse: 79 (Regular)  BP: 128/80 (Sitting, Left Arm, Standard)     Physical Exam Mckenzie Hollingshead MD; 06/10/2014 10:24 AM)  The physical exam findings are as follows: Note:Gen-WDWN in NAD Right breast/chest-central breast scar; port-a-cath incision clean and intact Right axillary scar    Assessment & Plan Mckenzie Hollingshead MD; 06/10/2014 10:24 AM)  MALIGNANT NEOPLASM INVOLVING BOTH NIPPLE AND AREOLA OF RIGHT BREAST IN FEMALE  (174.0  C50.011) Impression: Wounds have healed well. Chemotherapy started.  Plan: RTC in 3 months.  Current Plans Follow up in 3 months or as needed Instructions: May apply Mederma scar cream to wounds. Continued Norco 5-325MG , 1-2 Tablet every four hours, as needed, #30, 06/10/2014, No Refill.  Mckenzie Confer, MD

## 2014-06-12 ENCOUNTER — Ambulatory Visit (HOSPITAL_BASED_OUTPATIENT_CLINIC_OR_DEPARTMENT_OTHER): Payer: Medicare Other | Admitting: Hematology and Oncology

## 2014-06-12 ENCOUNTER — Telehealth: Payer: Self-pay | Admitting: Hematology and Oncology

## 2014-06-12 ENCOUNTER — Other Ambulatory Visit (HOSPITAL_BASED_OUTPATIENT_CLINIC_OR_DEPARTMENT_OTHER): Payer: Medicare Other

## 2014-06-12 ENCOUNTER — Ambulatory Visit: Payer: 59 | Attending: General Surgery | Admitting: Physical Therapy

## 2014-06-12 VITALS — BP 144/75 | HR 95 | Temp 98.7°F | Resp 18 | Ht 62.0 in | Wt 132.7 lb

## 2014-06-12 DIAGNOSIS — M25611 Stiffness of right shoulder, not elsewhere classified: Secondary | ICD-10-CM | POA: Insufficient documentation

## 2014-06-12 DIAGNOSIS — Z170 Estrogen receptor positive status [ER+]: Secondary | ICD-10-CM

## 2014-06-12 DIAGNOSIS — C50411 Malignant neoplasm of upper-outer quadrant of right female breast: Secondary | ICD-10-CM

## 2014-06-12 DIAGNOSIS — C50911 Malignant neoplasm of unspecified site of right female breast: Secondary | ICD-10-CM | POA: Insufficient documentation

## 2014-06-12 LAB — COMPREHENSIVE METABOLIC PANEL (CC13)
ALT: 18 U/L (ref 0–55)
AST: 14 U/L (ref 5–34)
Albumin: 3.6 g/dL (ref 3.5–5.0)
Alkaline Phosphatase: 148 U/L (ref 40–150)
Anion Gap: 9 mEq/L (ref 3–11)
BUN: 7.1 mg/dL (ref 7.0–26.0)
CO2: 28 meq/L (ref 22–29)
Calcium: 9.2 mg/dL (ref 8.4–10.4)
Chloride: 101 mEq/L (ref 98–109)
Creatinine: 0.7 mg/dL (ref 0.6–1.1)
EGFR: >90 mL/min/{1.73_m2} (ref >90)
GLUCOSE: 106 mg/dL (ref 70–140)
Potassium: 3.4 mEq/L — ABNORMAL LOW (ref 3.5–5.1)
Sodium: 138 mEq/L (ref 136–145)
Total Bilirubin: 0.2 mg/dL (ref 0.20–1.20)
Total Protein: 6.7 g/dL (ref 6.4–8.3)

## 2014-06-12 LAB — CBC WITH DIFFERENTIAL/PLATELET
BASO%: 0.5 % (ref 0.0–2.0)
BASOS ABS: 0 10*3/uL (ref 0.0–0.1)
EOS%: 1.4 % (ref 0.0–7.0)
Eosinophils Absolute: 0.1 10*3/uL (ref 0.0–0.5)
HCT: 37.4 % (ref 34.8–46.6)
HEMOGLOBIN: 12.5 g/dL (ref 11.6–15.9)
LYMPH%: 14.5 % (ref 14.0–49.7)
MCH: 29.4 pg (ref 25.1–34.0)
MCHC: 33.4 g/dL (ref 31.5–36.0)
MCV: 88 fL (ref 79.5–101.0)
MONO#: 0.3 10*3/uL (ref 0.1–0.9)
MONO%: 5.1 % (ref 0.0–14.0)
NEUT%: 78.5 % — ABNORMAL HIGH (ref 38.4–76.8)
NEUTROS ABS: 4.5 10*3/uL (ref 1.5–6.5)
PLATELETS: 116 10*3/uL — AB (ref 145–400)
RBC: 4.25 10*6/uL (ref 3.70–5.45)
RDW: 12.4 % (ref 11.2–14.5)
WBC: 5.7 10*3/uL (ref 3.9–10.3)
lymph#: 0.8 10*3/uL — ABNORMAL LOW (ref 0.9–3.3)

## 2014-06-12 NOTE — Progress Notes (Signed)
Patient Care Team: Tivis Ringer, MD as PCP - General (Internal Medicine) [name], MD as Consulting Physician (General Surgery) Rulon Eisenmenger, MD as Consulting Physician (Hematology and Oncology) [name], MD as Consulting Physician (Radiation Oncology)  DIAGNOSIS: Breast cancer of upper-outer quadrant of right female breast   Staging form: Breast, AJCC 7th Edition     Clinical: Stage IA (T1b, N0, cM0) - Signed by Rulon Eisenmenger, MD on 03/20/2014     Pathologic: Stage IIB (T2, N1, cM0) - Unsigned   SUMMARY OF ONCOLOGIC HISTORY:   Breast cancer of upper-outer quadrant of right female breast   03/12/2014 Initial Biopsy Invasive ductal carcinoma with DCIS, right axillary lymph node biopsy negative, ER percent, PR 84%, Ki-67 16%, HER-2 negative   03/20/2014 Breast MRI Right breast upper outer quadrant 2.6 x 1.9 x 1.7 cm invasive ductal carcinoma with extension and involvement of the adjacent skin and nipple area complex, no abnormal lymph nodes   04/20/2014 Surgery Right breast lumpectomy: IDC grade 2; 2.5 cm with intermediate to high-grade DCIS margins negative: 1/3 positive sentinel lymph node with extracapsular extension, ER 100%, PR 84%, HER-2 negative ratio 1.22, Ki-67 16% T2, N1, M0 stage II B   06/04/2014 -  Chemotherapy Adjuvant chemotherapy dose dense Adriamycin and Cytoxan 4 followed by Taxol 12    CHIEF COMPLIANT: Cycle 1 day 9 Adriamycin and Cytoxan  INTERVAL HISTORY: Mckenzie Sanford is a 59 year old lady who is here for adjuvant chemotherapy. She received cycle 1 of treatment last Thursday. She is here today for toxicity check. She reports that she had intense headache for 4-5 days after the chemotherapy. This started right when dexamethasone was being infused. The headache did not resolve with any over-the-counter medications. It finally resolved currently. She also noticed an intense low back pain radiating to the front of the chest that started last night. It  is subsiding slowly currently. She took some hydrocodone that she had from before. This seemed to have helped her pain.  REVIEW OF SYSTEMS:   Constitutional: Denies fevers, chills or abnormal weight loss Eyes: Denies blurriness of vision, complains of headaches Ears, nose, mouth, throat, and face: Denies mucositis or sore throat Respiratory: Denies cough, dyspnea or wheezes Cardiovascular: Denies palpitation, chest discomfort or lower extremity swelling Gastrointestinal:  Denies nausea, heartburn or change in bowel habits Skin: Denies abnormal skin rashes Lymphatics: Denies new lymphadenopathy or easy bruising Neurological:Denies numbness, tingling or new weaknesses Behavioral/Psych: Mood is stable, no new changes  All other systems were reviewed with the patient and are negative.  I have reviewed the past medical history, past surgical history, social history and family history with the patient and they are unchanged from previous note.  ALLERGIES:  is allergic to shellfish allergy; ivp dye; and penicillins.  MEDICATIONS:  Current Outpatient Prescriptions  Medication Sig Dispense Refill  . acetaminophen (TYLENOL) 325 MG tablet Take 325 mg by mouth 1 day or 1 dose.    Marland Kitchen amLODipine (NORVASC) 10 MG tablet Take 10 mg by mouth daily.    . Biotin 1000 MCG tablet Take 1,000 mcg by mouth 3 (three) times daily.    . cholecalciferol (VITAMIN D) 1000 UNITS tablet Take 1,000 Units by mouth daily.    . COD LIVER OIL PO Take by mouth.    . dexamethasone (DECADRON) 4 MG tablet Take 2 tablets by mouth once a day on the day after chemotherapy and then take 2 tablets two times a day for 2 days. Take with  food. 30 tablet 1  . HYDROcodone-acetaminophen (NORCO/VICODIN) 5-325 MG per tablet Take 1-2 tablets by mouth every 4 (four) hours as needed for moderate pain or severe pain. 30 tablet 0  . ibuprofen (ADVIL,MOTRIN) 200 MG tablet Take 400 mg by mouth every 6 (six) hours as needed.    . irbesartan (AVAPRO)  150 MG tablet     . lidocaine-prilocaine (EMLA) cream Apply to affected area once 30 g 3  . Linaclotide (LINZESS PO) Take by mouth.    . loratadine (CLARITIN) 10 MG tablet Take 10 mg by mouth daily.    Marland Kitchen LORazepam (ATIVAN) 0.5 MG tablet Take 1 tablet (0.5 mg total) by mouth every 6 (six) hours as needed (Nausea or vomiting). 30 tablet 0  . metoprolol succinate (TOPROL-XL) 50 MG 24 hr tablet Take 50 mg by mouth daily. Take with or immediately following a meal.    . Multiple Vitamin (MULTIVITAMIN) tablet Take 1 tablet by mouth daily.    . ondansetron (ZOFRAN) 8 MG tablet Take 1 tablet (8 mg total) by mouth 2 (two) times daily as needed. Start on the third day after chemotherapy. 30 tablet 1  . prochlorperazine (COMPAZINE) 10 MG tablet Take 1 tablet (10 mg total) by mouth every 6 (six) hours as needed (Nausea or vomiting). 30 tablet 1  . sertraline (ZOLOFT) 25 MG tablet at bedtime.   11  . simvastatin (ZOCOR) 40 MG tablet Take 40 mg by mouth every evening.     No current facility-administered medications for this visit.    PHYSICAL EXAMINATION: ECOG PERFORMANCE STATUS: 1 - Symptomatic but completely ambulatory  Filed Vitals:   06/12/14 1119  BP: 144/75  Pulse: 95  Temp: 98.7 F (37.1 C)  Resp: 18   Filed Weights   06/12/14 1119  Weight: 132 lb 11.2 oz (60.192 kg)    GENERAL:alert, no distress and comfortable SKIN: skin color, texture, turgor are normal, no rashes or significant lesions EYES: normal, Conjunctiva are pink and non-injected, sclera clear OROPHARYNX:no exudate, no erythema and lips, buccal mucosa, and tongue normal  NECK: supple, thyroid normal size, non-tender, without nodularity LYMPH:  no palpable lymphadenopathy in the cervical, axillary or inguinal LUNGS: clear to auscultation and percussion with normal breathing effort HEART: regular rate & rhythm and no murmurs and no lower extremity edema ABDOMEN:abdomen soft, non-tender and normal bowel  sounds Musculoskeletal:no cyanosis of digits and no clubbing  NEURO: alert & oriented x 3 with fluent speech, no focal motor/sensory deficits   LABORATORY DATA:  I have reviewed the data as listed   Chemistry      Component Value Date/Time   NA 141 06/04/2014 1021   NA 139 05/28/2014 1330   K 3.4* 06/04/2014 1021   K 4.3 05/28/2014 1330   CL 105 05/28/2014 1330   CO2 31* 06/04/2014 1021   CO2 28 05/28/2014 1330   BUN 10.8 06/04/2014 1021   BUN 11 05/28/2014 1330   CREATININE 0.8 06/04/2014 1021   CREATININE 0.75 05/28/2014 1330   CREATININE 0.72 12/11/2011 1238      Component Value Date/Time   CALCIUM 9.8 06/04/2014 1021   CALCIUM 9.6 05/28/2014 1330   ALKPHOS 138 06/04/2014 1021   ALKPHOS 134* 04/16/2014 1430   AST 16 06/04/2014 1021   AST 19 04/16/2014 1430   ALT 20 06/04/2014 1021   ALT 24 04/16/2014 1430   BILITOT 0.37 06/04/2014 1021   BILITOT <0.2* 04/16/2014 1430       Lab Results  Component Value  Date   WBC 5.7 06/12/2014   HGB 12.5 06/12/2014   HCT 37.4 06/12/2014   MCV 88.0 06/12/2014   PLT 116* 06/12/2014   NEUTROABS 4.5 06/12/2014   ASSESSMENT & PLAN:  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 Severe headaches: Patient attributes this to dexamethasone but I suspect it may be related to Zofran as well. For the next chemotherapy I will discontinue dexamethasone both oral and IV.  2. Severe back and chest wall pain related to Neulasta: I instructed her to take Tylenol, Claritin-D, hydrocodone as  needed. I reviewed her blood counts from today which appeared to be normal. Monitoring closely for chemotoxicities.  Return to clinic in one week next Thursday for cycle 2   Monitoring closely for toxicities Return to clinic in 1 week for cycle 2     Orders Placed This Encounter  Procedures  . CBC with Differential    Standing Status: Future     Number of Occurrences:      Standing Expiration Date: 06/12/2015  . Comprehensive metabolic panel (Cmet) - [facility]    Standing Status: Future     Number of Occurrences:      Standing Expiration Date: 06/12/2015   The patient has a good understanding of the overall plan. she agrees with it. She will call with any problems that may develop before her next visit here.   Rulon Eisenmenger, MD

## 2014-06-12 NOTE — Telephone Encounter (Signed)
per pof to sch pt appt-gave pt copy of sch °

## 2014-06-12 NOTE — Assessment & Plan Note (Signed)
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 sentinel lymph nodes with extracapsular extension, ER 100%, PR 84%, HER-2 negative, Ki-67 16%, T2a N1 M0 stage IIB  Current treatment:Adjuvant systemic chemotherapy with dose dense Adriamycin and Cytoxan x4 cycles followed by weekly Taxol x12 today cycle 1 day 8 of dose dense Adriamycin and Cytoxan  Chemotherapy toxicities: 1. Severe headaches: Patient attributes this to dexamethasone but I suspect it may be related to Zofran as well. For the next chemotherapy I will discontinue dexamethasone both oral and IV.  2. Severe back and chest wall pain related to Neulasta: I instructed her to take Tylenol, Claritin-D, hydrocodone as needed. I reviewed her blood counts from today which appeared to be normal. Monitoring closely for chemotoxicities.  Return to clinic in one week next Thursday for cycle 2   Monitoring closely for toxicities Return to clinic in 1 week for cycle 2

## 2014-06-12 NOTE — Therapy (Signed)
[place], [place], [address] Phone: [phone]   Fax:  [phone]  Physical Therapy Evaluation  Patient Details  Name: Mckenzie Sanford MRN: [id number] Date of Birth: [birth year] Referring Provider:  [name], MD  Encounter Date: 06/12/2014      PT End of Session - 06/12/14 0949    Visit Number 1   Number of Visits 1   PT Start Time 0805   PT Stop Time 0851   PT Time Calculation (min) 46 min      Past Medical History  Diagnosis Date  . HTN (hypertension)   . Allergic rhinitis   . Chest pain      ETT 11/09 - low risk (R wave amplitude increased) note echo above  . Hypercholesteremia   . Palpitations      echocardiogram 2006-normal EF, trace TR  . IBS (irritable bowel syndrome)   . Breast cancer 03/12/14    right/invasive ductal carcinoma, DCIS  . Wears glasses   . Depression     Past Surgical History  Procedure Laterality Date  . Dilation and curettage of uterus    . Cyst excision      right axilla  . Colonoscopy    . Tonsillectomy    . Radioactive seed guided mastectomy with axillary sentinel lymph node biopsy Right 04/20/2014    Procedure: RIGHT BREAST RADIOACTIVE SEED GUIDED LUMPECTOMY WITH RIGHT AXILLARY SENTINEL LYMPH NODE BIOPSY;  Surgeon: [name], MD;  Location: [place] [facility];  Service: General;  Laterality: Right;  . Portacath placement Right 06/01/2014    Procedure: ULTRASOUND GUIDED INSERTION PORT-A-CATH;  Surgeon: [name], MD;  Location: [place];  Service: General;  Laterality: Right;    LMP 09/26/2011  Visit Diagnosis:  Stiffness of joint, shoulder region, right - Plan: PT plan of care cert/re-cert  Breast cancer, right - Plan: PT plan of care cert/re-cert      Subjective Assessment - 06/12/14 0816    Symptoms Having pains starting from the back and moving around to her chest making it hard to catch her breath at  times.   Pertinent History Right breast cancer with lumpectomy 04/20/14 with 3 lymph nodes removed; currently doing chemotherapy and will follow with radiation and hormonal therapy.   Currently in Pain? Yes   Pain Score 10-Worst pain ever   Pain Location Back   Pain Orientation Posterior;Anterior   Pain Descriptors / Indicators Dull;Pounding;Throbbing   Aggravating Factors  doesn't know   Pain Relieving Factors prop up with pillows; hydrocodone          [facility] PT Assessment - 06/12/14 0001    Assessment   Medical Diagnosis right breast cancer   Precautions   Precautions Other (comment)   Precaution Comments cancer precautions/no driving until painfree   Restrictions   Weight Bearing Restrictions No   Balance Screen   Has the patient fallen in the past 6 months No   Has the patient had a decrease in activity level because of a fear of falling?  No   Is the patient reluctant to leave their home because of a fear of falling?  No   Home Environment   Living Enviornment Private residence   Living Arrangements Other relatives   Type of [place] to enter   Entrance Stairs-Number of Steps two   [place] One level   Prior Function   Level of Independence Independent with basic ADLs;Independent  with homemaking with ambulation;Independent with gait   Vocation Full time employment   Vocation Requirements teaches preschool   Observation/Other Assessments   Skin Integrity incisions at right axilla and across nipple area are well-healed; Portacath site also on right is healing   Other Surveys  Select   Quick DASH  14   Posture/Postural Control   Posture/Postural Control No significant limitations  slight forward head and rounded shoulders   AROM   Right Shoulder Extension 55 Degrees   Right Shoulder Flexion 140 Degrees   Right Shoulder ABduction 166 Degrees   Right Shoulder Internal Rotation 37 Degrees   Right Shoulder External Rotation 70 Degrees   Left  Shoulder Extension 58 Degrees   Left Shoulder Flexion 154 Degrees   Left Shoulder ABduction 167 Degrees   Left Shoulder Internal Rotation 60 Degrees   Left Shoulder External Rotation 90 Degrees   Ambulation/Gait   Ambulation/Gait Yes   Ambulation/Gait Assistance 7: Independent           LYMPHEDEMA/ONCOLOGY QUESTIONNAIRE - 06/12/14 0825    Type   Cancer Type right breast cancer   Surgeries   Lumpectomy Date 04/20/14   Treatment   Active Chemotherapy Treatment Yes   Date 06/04/14   Lymphedema Assessments   Lymphedema Assessments Upper extremities   Right Upper Extremity Lymphedema   10 cm Proximal to Olecranon Process 27.1 cm   Olecranon Process 23.8 cm   10 cm Proximal to Ulnar Styloid Process 21 cm   Just Proximal to Ulnar Styloid Process 15 cm   Across Hand at [employer] 16.8 cm   At [place] of 2nd Digit 5.4 cm   Left Upper Extremity Lymphedema   10 cm Proximal to Olecranon Process 27.5 cm   Olecranon Process 23.4 cm   10 cm Proximal to Ulnar Styloid Process 20.8 cm   Just Proximal to Ulnar Styloid Process 15.1 cm   Across Hand at [employer] 16.7 cm   At [place] of 2nd Digit 5.3 cm           Quick Dash - 06/12/14 0001    Open a tight or new jar Moderate difficulty   Do heavy household chores (wash walls, wash floors) Mild difficulty   Carry a shopping bag or briefcase No difficulty   Wash your back No difficulty   Use a knife to cut food No difficulty   During the past week, to what extent has your arm, shoulder or hand problem interfered with your normal social activities with family, friends, neighbors, or groups? Slightly   During the past week, to what extent has your arm, shoulder or hand problem limited your work or other regular daily activities Not at all   Arm, shoulder, or hand pain. Mild   Tingling (pins and needles) in your arm, shoulder, or hand None   Difficulty Sleeping Moderate difficulty   DASH Score 13.64 %                             Long Term Clinic Goals - 06/12/14 1937    CC Long Term Goal  #1   Title Patient will be knowledgeable about lymphedema risk reduction practices.   Time 1   Period Weeks   Status New   CC Long Term Goal  #2   Title Pt. will be knowledgeable about beneficial exercise for ROM and health.   Time 1   Period Weeks   Status  New     Patient should be able to achieve these goals by attending ABC class, which she hopes to do on 06/15/14.       Plan - 06/12/14 0949    Clinical Impression Statement Patient s/p lumpectomy for breast cancer is doing well with AROM currently.  She will benefit from further education but currently doesn't need follow-up therapy.  She can attend the ABC class for information that will be helpful to her.   Pt will benefit from skilled therapeutic intervention in order to improve on the following deficits Decreased knowledge of precautions   Rehab Potential Excellent   PT Frequency One time visit   PT Treatment/Interventions Patient/family education   PT Next Visit Plan No further visits planned at this time.   Recommended Other Services Attend ABC class for lymphedema and exercise instruction.   Consulted and Agree with Plan of Care Patient         Problem List Patient Active Problem List   Diagnosis Date Noted  . Breast cancer of upper-outer quadrant of right female breast 03/20/2014  . Palpitations 03/26/2013  . Hypertension 07/02/2011  . Hyperlipidemia 07/02/2011  . CONSTIPATION 01/22/2009  . FLATULENCE-GAS-BLOATING 01/22/2009    Alpha Chouinard 06/12/2014, 10:01 AM  [place], [place], [address] Phone: [phone]   Fax:  [phone]  [place], PT   PHYSICAL THERAPY DISCHARGE SUMMARY  Visits from Start of Care: 1  Current functional level related to goals / functional outcomes: She is doing well with ROM;  goals set at evaluation do not require individual therapy, but will be met if patient attends ABC class.   Remaining deficits: Slight ROM limitations as noted in measurements above, but patient should be able to continue to make gains by doing stretches that she will learn at [facility] class.   Education / Equipment: About the fact that she is doing well and suggestion to attend ABC class. Plan: Patient agrees to discharge.  Patient goals were not met. Patient is being discharged due to                                                     No need for individual therapy at this time.  Patient knows she may be re-referred if needs develop later on.  Goals should be met by attending ABC class.  Rector Devonshire, PT?????

## 2014-06-17 ENCOUNTER — Telehealth: Payer: Self-pay | Admitting: Hematology and Oncology

## 2014-06-17 ENCOUNTER — Other Ambulatory Visit: Payer: Medicare Other

## 2014-06-17 ENCOUNTER — Ambulatory Visit (HOSPITAL_BASED_OUTPATIENT_CLINIC_OR_DEPARTMENT_OTHER): Payer: Medicare Other | Admitting: Hematology and Oncology

## 2014-06-17 ENCOUNTER — Telehealth: Payer: Self-pay | Admitting: *Deleted

## 2014-06-17 VITALS — BP 136/86 | HR 67 | Temp 98.4°F | Resp 18 | Ht 62.0 in | Wt 130.9 lb

## 2014-06-17 DIAGNOSIS — R0789 Other chest pain: Secondary | ICD-10-CM

## 2014-06-17 DIAGNOSIS — Z170 Estrogen receptor positive status [ER+]: Secondary | ICD-10-CM

## 2014-06-17 DIAGNOSIS — C50411 Malignant neoplasm of upper-outer quadrant of right female breast: Secondary | ICD-10-CM

## 2014-06-17 DIAGNOSIS — M549 Dorsalgia, unspecified: Secondary | ICD-10-CM

## 2014-06-17 LAB — CBC WITH DIFFERENTIAL/PLATELET
BASO%: 0.7 % (ref 0.0–2.0)
BASOS ABS: 0.1 10*3/uL (ref 0.0–0.1)
EOS%: 0.2 % (ref 0.0–7.0)
Eosinophils Absolute: 0 10*3/uL (ref 0.0–0.5)
HEMATOCRIT: 40.3 % (ref 34.8–46.6)
HEMOGLOBIN: 13.3 g/dL (ref 11.6–15.9)
LYMPH%: 14.5 % (ref 14.0–49.7)
MCH: 29.4 pg (ref 25.1–34.0)
MCHC: 33 g/dL (ref 31.5–36.0)
MCV: 89.2 fL (ref 79.5–101.0)
MONO#: 0.7 10*3/uL (ref 0.1–0.9)
MONO%: 8.4 % (ref 0.0–14.0)
NEUT#: 6.2 10*3/uL (ref 1.5–6.5)
NEUT%: 76.2 % (ref 38.4–76.8)
Platelets: 239 10*3/uL (ref 145–400)
RBC: 4.52 10*6/uL (ref 3.70–5.45)
RDW: 12.7 % (ref 11.2–14.5)
WBC: 8.2 10*3/uL (ref 3.9–10.3)
lymph#: 1.2 10*3/uL (ref 0.9–3.3)
nRBC: 2 % — ABNORMAL HIGH (ref 0–0)

## 2014-06-17 LAB — COMPREHENSIVE METABOLIC PANEL (CC13)
ALBUMIN: 3.8 g/dL (ref 3.5–5.0)
ALT: 26 U/L (ref 0–55)
AST: 19 U/L (ref 5–34)
Alkaline Phosphatase: 153 U/L — ABNORMAL HIGH (ref 40–150)
Anion Gap: 10 mEq/L (ref 3–11)
BUN: 6.8 mg/dL — ABNORMAL LOW (ref 7.0–26.0)
CHLORIDE: 103 meq/L (ref 98–109)
CO2: 28 mEq/L (ref 22–29)
Calcium: 9.8 mg/dL (ref 8.4–10.4)
Creatinine: 0.8 mg/dL (ref 0.6–1.1)
Glucose: 99 mg/dl (ref 70–140)
Potassium: 3.7 mEq/L (ref 3.5–5.1)
SODIUM: 141 meq/L (ref 136–145)
TOTAL PROTEIN: 7 g/dL (ref 6.4–8.3)
Total Bilirubin: <0.2 mg/dL (ref 0.20–1.20)

## 2014-06-17 NOTE — Addendum Note (Signed)
Addended by: Prentiss Bells on: 06/17/2014 01:38 PM   Modules accepted: Medications

## 2014-06-17 NOTE — Progress Notes (Signed)
Patient Care Team: Tivis Ringer, MD as PCP - General (Internal Medicine) [name], MD as Consulting Physician (General Surgery) Rulon Eisenmenger, MD as Consulting Physician (Hematology and Oncology) [name], MD as Consulting Physician (Radiation Oncology)  DIAGNOSIS: Breast cancer of upper-outer quadrant of right female breast   Staging form: Breast, AJCC 7th Edition     Clinical: Stage IA (T1b, N0, cM0) - Signed by Rulon Eisenmenger, MD on 03/20/2014     Pathologic: Stage IIB (T2, N1, cM0) - Unsigned   SUMMARY OF ONCOLOGIC HISTORY:   Breast cancer of upper-outer quadrant of right female breast   03/12/2014 Initial Biopsy Invasive ductal carcinoma with DCIS, right axillary lymph node biopsy negative, ER percent, PR 84%, Ki-67 16%, HER-2 negative   03/20/2014 Breast MRI Right breast upper outer quadrant 2.6 x 1.9 x 1.7 cm invasive ductal carcinoma with extension and involvement of the adjacent skin and nipple area complex, no abnormal lymph nodes   04/20/2014 Surgery Right breast lumpectomy: IDC grade 2; 2.5 cm with intermediate to high-grade DCIS margins negative: 1/3 positive sentinel lymph node with extracapsular extension, ER 100%, PR 84%, HER-2 negative ratio 1.22, Ki-67 16% T2, N1, M0 stage II B   06/04/2014 -  Chemotherapy Adjuvant chemotherapy dose dense Adriamycin and Cytoxan 4 followed by Taxol 12    CHIEF COMPLIANT: Tomorrow cycle 2 of chemotherapy with Adriamycin and Cytoxan  INTERVAL HISTORY: Mckenzie Sanford is a 59 year old Afro-American lady with above-mentioned history of right-sided breast cancer who underwent lumpectomy and is here on adjuvant chemotherapy. She had received cycle 1 of chemotherapy and is scheduled to get cycle 2 tomorrow. She reported headaches and back pain with cycle 1. Headaches have disappeared after Zofran was discontinued. The back pain continues to be intermittently achy and she still feels discomfort in her lower back. She had to take  hydrocodone to relieve the pain. She reports that she has tough time falling asleep and has been awake all night and is now tired and ready to go to sleep.  REVIEW OF SYSTEMS:   Constitutional: Denies fevers, chills or abnormal weight loss Eyes: Denies blurriness of vision Ears, nose, mouth, throat, and face: Denies mucositis or sore throat Respiratory: Denies cough, dyspnea or wheezes Cardiovascular: Denies palpitation, chest discomfort or lower extremity swelling Gastrointestinal:  Denies nausea, heartburn or change in bowel habits Skin: Denies abnormal skin rashes Lymphatics: Denies new lymphadenopathy or easy bruising Neurological:Denies numbness, tingling or new weaknesses Behavioral/Psych: Mood is stable, no new changes  Breast: denies any pain or lumps or nodules in either breasts All other systems were reviewed with the patient and are negative.  I have reviewed the past medical history, past surgical history, social history and family history with the patient and they are unchanged from previous note.  ALLERGIES:  is allergic to shellfish allergy; ivp dye; and penicillins.  MEDICATIONS:  Current Outpatient Prescriptions  Medication Sig Dispense Refill  . acetaminophen (TYLENOL) 325 MG tablet Take 325 mg by mouth 1 day or 1 dose.    Marland Kitchen amLODipine (NORVASC) 10 MG tablet Take 10 mg by mouth daily.    . Biotin 1000 MCG tablet Take 1,000 mcg by mouth 3 (three) times daily.    . cholecalciferol (VITAMIN D) 1000 UNITS tablet Take 1,000 Units by mouth daily.    . COD LIVER OIL PO Take by mouth.    Marland Kitchen HYDROcodone-acetaminophen (NORCO/VICODIN) 5-325 MG per tablet Take 1-2 tablets by mouth every 4 (four) hours as needed for  moderate pain or severe pain. 30 tablet 0  . ibuprofen (ADVIL,MOTRIN) 200 MG tablet Take 400 mg by mouth every 6 (six) hours as needed.    . irbesartan (AVAPRO) 150 MG tablet     . lidocaine-prilocaine (EMLA) cream Apply to affected area once 30 g 3  . Linaclotide  (LINZESS PO) Take by mouth.    . loratadine (CLARITIN) 10 MG tablet Take 10 mg by mouth daily.    Marland Kitchen LORazepam (ATIVAN) 0.5 MG tablet Take 1 tablet (0.5 mg total) by mouth every 6 (six) hours as needed (Nausea or vomiting). 30 tablet 0  . metoprolol succinate (TOPROL-XL) 50 MG 24 hr tablet Take 50 mg by mouth daily. Take with or immediately following a meal.    . Multiple Vitamin (MULTIVITAMIN) tablet Take 1 tablet by mouth daily.    . prochlorperazine (COMPAZINE) 10 MG tablet Take 1 tablet (10 mg total) by mouth every 6 (six) hours as needed (Nausea or vomiting). 30 tablet 1  . sertraline (ZOLOFT) 25 MG tablet at bedtime.   11  . simvastatin (ZOCOR) 40 MG tablet Take 40 mg by mouth every evening.     No current facility-administered medications for this visit.    PHYSICAL EXAMINATION: ECOG PERFORMANCE STATUS: 1 - Symptomatic but completely ambulatory  Filed Vitals:   06/17/14 0848  BP: 136/86  Pulse: 67  Temp: 98.4 F (36.9 C)  Resp: 18   Filed Weights   06/17/14 0848  Weight: 130 lb 14.4 oz (59.376 kg)    GENERAL:alert, no distress and comfortable SKIN: skin color, texture, turgor are normal, no rashes or significant lesions EYES: normal, Conjunctiva are pink and non-injected, sclera clear OROPHARYNX:no exudate, no erythema and lips, buccal mucosa, and tongue normal  NECK: supple, thyroid normal size, non-tender, without nodularity LYMPH:  no palpable lymphadenopathy in the cervical, axillary or inguinal LUNGS: clear to auscultation and percussion with normal breathing effort HEART: regular rate & rhythm and no murmurs and no lower extremity edema ABDOMEN:abdomen soft, non-tender and normal bowel sounds Musculoskeletal:no cyanosis of digits and no clubbing  NEURO: alert & oriented x 3 with fluent speech, no focal motor/sensory deficits  LABORATORY DATA:  I have reviewed the data as listed   Chemistry      Component Value Date/Time   NA 141 06/17/2014 0840   NA 139  05/28/2014 1330   K 3.7 06/17/2014 0840   K 4.3 05/28/2014 1330   CL 105 05/28/2014 1330   CO2 28 06/17/2014 0840   CO2 28 05/28/2014 1330   BUN 6.8* 06/17/2014 0840   BUN 11 05/28/2014 1330   CREATININE 0.8 06/17/2014 0840   CREATININE 0.75 05/28/2014 1330   CREATININE 0.72 12/11/2011 1238      Component Value Date/Time   CALCIUM 9.8 06/17/2014 0840   CALCIUM 9.6 05/28/2014 1330   ALKPHOS 153* 06/17/2014 0840   ALKPHOS 134* 04/16/2014 1430   AST 19 06/17/2014 0840   AST 19 04/16/2014 1430   ALT 26 06/17/2014 0840   ALT 24 04/16/2014 1430   BILITOT <0.20 06/17/2014 0840   BILITOT <0.2* 04/16/2014 1430       Lab Results  Component Value Date   WBC 8.2 06/17/2014   HGB 13.3 06/17/2014   HCT 40.3 06/17/2014   MCV 89.2 06/17/2014   PLT 239 06/17/2014   NEUTROABS 6.2 06/17/2014   ASSESSMENT & PLAN:  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 Severe headaches: Patient attributes this to dexamethasone and Zofran. I discontinued them both  2. Severe back and chest wall pain related to Neulasta: Continues to be a problem. Patient takes hydrocodone when necessary Monitoring closely for chemotoxicities.  Return to clinic in 2 weeks for cycle 3     Orders Placed This Encounter  Procedures  . CBC with Differential  . Comprehensive metabolic panel   The patient has a good understanding of the overall plan. she agrees with it. She will call with any problems that may develop before her next visit here.   Rulon Eisenmenger, MD

## 2014-06-17 NOTE — Telephone Encounter (Signed)
Per staff message and POF I have scheduled appts. Advised scheduler of appts. JMW  

## 2014-06-17 NOTE — Assessment & Plan Note (Signed)
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 sentinel lymph nodes with extracapsular extension, ER 100%, PR 84%, HER-2 negative, Ki-67 16%, T2a N1 M0 stage IIB  Current treatment:Adjuvant systemic chemotherapy with dose dense Adriamycin and Cytoxan x4 cycles followed by weekly Taxol x12  Today cycle 1 day 13 of dose dense Adriamycin and Cytoxan  Chemotherapy toxicities: 1. Severe headaches: Patient attributes this to dexamethasone and Zofran. I discontinued them both  2. Severe back and chest wall pain related to Neulasta: Continues to be a problem. Patient takes hydrocodone when necessary Monitoring closely for chemotoxicities.  Return to clinic in 2 weeks for cycle 3

## 2014-06-17 NOTE — Telephone Encounter (Signed)
, °

## 2014-06-18 ENCOUNTER — Ambulatory Visit (HOSPITAL_BASED_OUTPATIENT_CLINIC_OR_DEPARTMENT_OTHER): Payer: Medicare Other

## 2014-06-18 ENCOUNTER — Other Ambulatory Visit: Payer: Medicare Other

## 2014-06-18 DIAGNOSIS — C50411 Malignant neoplasm of upper-outer quadrant of right female breast: Secondary | ICD-10-CM

## 2014-06-18 DIAGNOSIS — Z5111 Encounter for antineoplastic chemotherapy: Secondary | ICD-10-CM

## 2014-06-18 MED ORDER — FOSAPREPITANT DIMEGLUMINE INJECTION 150 MG
150.0000 mg | Freq: Once | INTRAVENOUS | Status: AC
  Administered 2014-06-18: 150 mg via INTRAVENOUS
  Filled 2014-06-18: qty 5

## 2014-06-18 MED ORDER — SODIUM CHLORIDE 0.9 % IV SOLN
Freq: Once | INTRAVENOUS | Status: AC
  Administered 2014-06-18: 13:00:00 via INTRAVENOUS

## 2014-06-18 MED ORDER — PALONOSETRON HCL INJECTION 0.25 MG/5ML
INTRAVENOUS | Status: AC
  Filled 2014-06-18: qty 5

## 2014-06-18 MED ORDER — PEGFILGRASTIM 6 MG/0.6ML ~~LOC~~ PSKT
6.0000 mg | PREFILLED_SYRINGE | Freq: Once | SUBCUTANEOUS | Status: AC
  Administered 2014-06-18: 6 mg via SUBCUTANEOUS
  Filled 2014-06-18: qty 0.6

## 2014-06-18 MED ORDER — SODIUM CHLORIDE 0.9 % IJ SOLN
10.0000 mL | INTRAMUSCULAR | Status: DC | PRN
  Administered 2014-06-18: 10 mL
  Filled 2014-06-18: qty 10

## 2014-06-18 MED ORDER — DOXORUBICIN HCL CHEMO IV INJECTION 2 MG/ML
60.0000 mg/m2 | Freq: Once | INTRAVENOUS | Status: AC
  Administered 2014-06-18: 96 mg via INTRAVENOUS
  Filled 2014-06-18: qty 48

## 2014-06-18 MED ORDER — SODIUM CHLORIDE 0.9 % IV SOLN
600.0000 mg/m2 | Freq: Once | INTRAVENOUS | Status: AC
  Administered 2014-06-18: 960 mg via INTRAVENOUS
  Filled 2014-06-18: qty 48

## 2014-06-18 MED ORDER — HEPARIN SOD (PORK) LOCK FLUSH 100 UNIT/ML IV SOLN
500.0000 [IU] | Freq: Once | INTRAVENOUS | Status: AC | PRN
  Administered 2014-06-18: 500 [IU]
  Filled 2014-06-18: qty 5

## 2014-06-18 MED ORDER — PALONOSETRON HCL INJECTION 0.25 MG/5ML
0.2500 mg | Freq: Once | INTRAVENOUS | Status: AC
  Administered 2014-06-18: 0.25 mg via INTRAVENOUS

## 2014-06-18 NOTE — Patient Instructions (Signed)
[facility] Discharge Instructions for Patients Receiving Chemotherapy  Today you received the following chemotherapy agents: Adriamycin, Cytoxan  To help prevent nausea and vomiting after your treatment, we encourage you to take your nausea medication as prescribed by your physician.   If you develop nausea and vomiting that is not controlled by your nausea medication, call the clinic.   BELOW ARE SYMPTOMS THAT SHOULD BE REPORTED IMMEDIATELY:  *FEVER GREATER THAN 100.5 F  *CHILLS WITH OR WITHOUT FEVER  NAUSEA AND VOMITING THAT IS NOT CONTROLLED WITH YOUR NAUSEA MEDICATION  *UNUSUAL SHORTNESS OF BREATH  *UNUSUAL BRUISING OR BLEEDING  TENDERNESS IN MOUTH AND THROAT WITH OR WITHOUT PRESENCE OF ULCERS  *URINARY PROBLEMS  *BOWEL PROBLEMS  UNUSUAL RASH Items with * indicate a potential emergency and should be followed up as soon as possible.  Feel free to call the clinic you have any questions or concerns. The clinic phone number is [phone].

## 2014-06-19 ENCOUNTER — Other Ambulatory Visit: Payer: Self-pay

## 2014-06-19 ENCOUNTER — Ambulatory Visit: Payer: Medicare Other

## 2014-06-22 ENCOUNTER — Telehealth: Payer: Self-pay | Admitting: *Deleted

## 2014-06-22 NOTE — Telephone Encounter (Signed)
This RN followed up per call received by triage.  Per further inquiry and discussion-  Pt is having her hair shaved for scalp discomfort per hair loss. She will also use her neck pillow to relieve pressure when she lays her head directly on a pillow.  For headache above the eyes since chemo she will take benadryl tonight when she goes to bed.  No other needs at this time.

## 2014-06-22 NOTE — Telephone Encounter (Signed)
Patient called reporting she has headache with each chemotherapy/  Rates as 7 to 8 on pain scale and it is worse at night.  "As long as I'm moving, it's okay.  I'm exhausted and lie down but I can't lie down because my scalp hurts.  I am loosing hair now.  I take tylenol and the hydrocodone 5-325 with slight relief.  What should I do for this headache.  My vision is slightly blurred when it hurts but this gets better quickly."  Will notify provider.  Can reach patient at [phone].

## 2014-06-24 ENCOUNTER — Other Ambulatory Visit (HOSPITAL_COMMUNITY): Payer: Self-pay | Admitting: General Surgery

## 2014-06-24 ENCOUNTER — Other Ambulatory Visit (INDEPENDENT_AMBULATORY_CARE_PROVIDER_SITE_OTHER): Payer: Self-pay | Admitting: General Surgery

## 2014-06-24 ENCOUNTER — Ambulatory Visit (HOSPITAL_COMMUNITY)
Admission: RE | Admit: 2014-06-24 | Discharge: 2014-06-24 | Disposition: A | Discharge status: Home / Self Care | Payer: 59 | Source: Ambulatory Visit | Attending: General Surgery | Admitting: General Surgery

## 2014-06-24 DIAGNOSIS — R221 Localized swelling, mass and lump, neck: Secondary | ICD-10-CM

## 2014-06-24 DIAGNOSIS — M7989 Other specified soft tissue disorders: Secondary | ICD-10-CM

## 2014-06-24 DIAGNOSIS — M79609 Pain in unspecified limb: Secondary | ICD-10-CM | POA: Insufficient documentation

## 2014-06-24 NOTE — Progress Notes (Signed)
VASCULAR LAB PRELIMINARY  PRELIMINARY  PRELIMINARY  PRELIMINARY  Right upper extremity venous duplex completed.    Preliminary report:  Right:  No evidence of DVT or superficial thrombosis.  Area surrounding the port a cath appears normal.  Gearlene Godsil, RVT 06/24/2014, 2:36 PM

## 2014-07-02 ENCOUNTER — Other Ambulatory Visit (HOSPITAL_BASED_OUTPATIENT_CLINIC_OR_DEPARTMENT_OTHER): Payer: 59

## 2014-07-02 ENCOUNTER — Ambulatory Visit (HOSPITAL_BASED_OUTPATIENT_CLINIC_OR_DEPARTMENT_OTHER): Payer: 59 | Admitting: Hematology and Oncology

## 2014-07-02 ENCOUNTER — Ambulatory Visit (HOSPITAL_BASED_OUTPATIENT_CLINIC_OR_DEPARTMENT_OTHER): Payer: 59

## 2014-07-02 ENCOUNTER — Telehealth: Payer: Self-pay | Admitting: Hematology and Oncology

## 2014-07-02 ENCOUNTER — Encounter: Payer: Self-pay | Admitting: *Deleted

## 2014-07-02 ENCOUNTER — Telehealth: Payer: Self-pay | Admitting: *Deleted

## 2014-07-02 VITALS — BP 131/83 | HR 80 | Temp 98.4°F | Resp 18 | Ht 62.0 in | Wt 132.9 lb

## 2014-07-02 DIAGNOSIS — Z5189 Encounter for other specified aftercare: Secondary | ICD-10-CM

## 2014-07-02 DIAGNOSIS — C50411 Malignant neoplasm of upper-outer quadrant of right female breast: Secondary | ICD-10-CM

## 2014-07-02 DIAGNOSIS — Z5111 Encounter for antineoplastic chemotherapy: Secondary | ICD-10-CM

## 2014-07-02 DIAGNOSIS — Z170 Estrogen receptor positive status [ER+]: Secondary | ICD-10-CM

## 2014-07-02 LAB — COMPREHENSIVE METABOLIC PANEL (CC13)
ALK PHOS: 165 U/L — AB (ref 40–150)
ALT: 22 U/L (ref 0–55)
ANION GAP: 12 meq/L — AB (ref 3–11)
AST: 17 U/L (ref 5–34)
Albumin: 3.9 g/dL (ref 3.5–5.0)
BUN: 8 mg/dL (ref 7.0–26.0)
CHLORIDE: 104 meq/L (ref 98–109)
CO2: 26 mEq/L (ref 22–29)
Calcium: 9.5 mg/dL (ref 8.4–10.4)
Creatinine: 0.7 mg/dL (ref 0.6–1.1)
GLUCOSE: 107 mg/dL (ref 70–140)
POTASSIUM: 3.4 meq/L — AB (ref 3.5–5.1)
Sodium: 142 mEq/L (ref 136–145)
Total Bilirubin: <0.2 mg/dL (ref 0.20–1.20)
Total Protein: 6.8 g/dL (ref 6.4–8.3)

## 2014-07-02 LAB — CBC WITH DIFFERENTIAL/PLATELET
BASO%: 0.4 % (ref 0.0–2.0)
Basophils Absolute: 0.1 10*3/uL (ref 0.0–0.1)
EOS%: 0.2 % (ref 0.0–7.0)
Eosinophils Absolute: 0 10*3/uL (ref 0.0–0.5)
HCT: 37.5 % (ref 34.8–46.6)
HGB: 12.4 g/dL (ref 11.6–15.9)
LYMPH%: 8.9 % — AB (ref 14.0–49.7)
MCH: 29.6 pg (ref 25.1–34.0)
MCHC: 33.1 g/dL (ref 31.5–36.0)
MCV: 89.5 fL (ref 79.5–101.0)
MONO#: 1.1 10*3/uL — ABNORMAL HIGH (ref 0.1–0.9)
MONO%: 7.7 % (ref 0.0–14.0)
NEUT#: 11.5 10*3/uL — ABNORMAL HIGH (ref 1.5–6.5)
NEUT%: 82.8 % — AB (ref 38.4–76.8)
Platelets: 195 10*3/uL (ref 145–400)
RBC: 4.19 10*6/uL (ref 3.70–5.45)
RDW: 13.3 % (ref 11.2–14.5)
WBC: 13.8 10*3/uL — ABNORMAL HIGH (ref 3.9–10.3)
lymph#: 1.2 10*3/uL (ref 0.9–3.3)

## 2014-07-02 MED ORDER — PEGFILGRASTIM 6 MG/0.6ML ~~LOC~~ PSKT
6.0000 mg | PREFILLED_SYRINGE | Freq: Once | SUBCUTANEOUS | Status: AC
Start: 2014-07-02 — End: 2014-07-02
  Administered 2014-07-02: 6 mg via SUBCUTANEOUS
  Filled 2014-07-02: qty 0.6

## 2014-07-02 MED ORDER — PALONOSETRON HCL INJECTION 0.25 MG/5ML
0.2500 mg | Freq: Once | INTRAVENOUS | Status: AC
  Administered 2014-07-02: 0.25 mg via INTRAVENOUS

## 2014-07-02 MED ORDER — PALONOSETRON HCL INJECTION 0.25 MG/5ML
INTRAVENOUS | Status: AC
  Filled 2014-07-02: qty 5

## 2014-07-02 MED ORDER — HEPARIN SOD (PORK) LOCK FLUSH 100 UNIT/ML IV SOLN
500.0000 [IU] | Freq: Once | INTRAVENOUS | Status: AC | PRN
  Administered 2014-07-02: 500 [IU]
  Filled 2014-07-02: qty 5

## 2014-07-02 MED ORDER — DEXAMETHASONE SODIUM PHOSPHATE 20 MG/5ML IJ SOLN
INTRAMUSCULAR | Status: AC
  Filled 2014-07-02: qty 5

## 2014-07-02 MED ORDER — DOXORUBICIN HCL CHEMO IV INJECTION 2 MG/ML
60.0000 mg/m2 | Freq: Once | INTRAVENOUS | Status: AC
  Administered 2014-07-02: 96 mg via INTRAVENOUS
  Filled 2014-07-02: qty 48

## 2014-07-02 MED ORDER — SODIUM CHLORIDE 0.9 % IV SOLN
Freq: Once | INTRAVENOUS | Status: AC
  Administered 2014-07-02: 12:00:00 via INTRAVENOUS

## 2014-07-02 MED ORDER — SODIUM CHLORIDE 0.9 % IV SOLN
600.0000 mg/m2 | Freq: Once | INTRAVENOUS | Status: AC
  Administered 2014-07-02: 960 mg via INTRAVENOUS
  Filled 2014-07-02: qty 48

## 2014-07-02 MED ORDER — SODIUM CHLORIDE 0.9 % IV SOLN
150.0000 mg | Freq: Once | INTRAVENOUS | Status: AC
  Administered 2014-07-02: 150 mg via INTRAVENOUS
  Filled 2014-07-02: qty 5

## 2014-07-02 MED ORDER — SODIUM CHLORIDE 0.9 % IJ SOLN
10.0000 mL | INTRAMUSCULAR | Status: DC | PRN
  Administered 2014-07-02: 10 mL
  Filled 2014-07-02: qty 10

## 2014-07-02 NOTE — Patient Instructions (Addendum)
[facility] Discharge Instructions for Patients Receiving Chemotherapy  Today you received the following chemotherapy agents Adriamycin/Cytoxan To help prevent nausea and vomiting after your treatment, we encourage you to take your nausea medication as prescribed.  If you develop nausea and vomiting that is not controlled by your nausea medication, call the clinic.   BELOW ARE SYMPTOMS THAT SHOULD BE REPORTED IMMEDIATELY:  *FEVER GREATER THAN 100.5 F  *CHILLS WITH OR WITHOUT FEVER  NAUSEA AND VOMITING THAT IS NOT CONTROLLED WITH YOUR NAUSEA MEDICATION  *UNUSUAL SHORTNESS OF BREATH  *UNUSUAL BRUISING OR BLEEDING  TENDERNESS IN MOUTH AND THROAT WITH OR WITHOUT PRESENCE OF ULCERS  *URINARY PROBLEMS  *BOWEL PROBLEMS  UNUSUAL RASH Items with * indicate a potential emergency and should be followed up as soon as possible.  Feel free to call the clinic you have any questions or concerns. The clinic phone number is [phone].    Pegfilgrastim injection (Neulasta) What is this medicine? PEGFILGRASTIM (peg fil GRA stim) is a long-acting granulocyte colony-stimulating factor that stimulates the growth of neutrophils, a type of white blood cell important in the body's fight against infection. It is used to reduce the incidence of fever and infection in patients with certain types of cancer who are receiving chemotherapy that affects the bone marrow. This medicine may be used for other purposes; ask your health care provider or pharmacist if you have questions. COMMON BRAND NAME(S): Neulasta What should I tell my health care provider before I take this medicine? They need to know if you have any of these conditions: -latex allergy -ongoing radiation therapy -sickle cell disease -skin reactions to acrylic adhesives (On-Body Injector only) -an unusual or allergic reaction to pegfilgrastim, filgrastim, other medicines, foods, dyes, or preservatives -pregnant or trying to  get pregnant -breast-feeding How should I use this medicine? This medicine is for injection under the skin. If you get this medicine at home, you will be taught how to prepare and give the pre-filled syringe or how to use the On-body Injector. Refer to the patient Instructions for Use for detailed instructions. Use exactly as directed. Take your medicine at regular intervals. Do not take your medicine more often than directed. It is important that you put your used needles and syringes in a special sharps container. Do not put them in a trash can. If you do not have a sharps container, call your pharmacist or healthcare provider to get one. Talk to your pediatrician regarding the use of this medicine in children. Special care may be needed. Overdosage: If you think you have taken too much of this medicine contact a poison control center or emergency room at once. NOTE: This medicine is only for you. Do not share this medicine with others. What if I miss a dose? It is important not to miss your dose. Call your doctor or health care professional if you miss your dose. If you miss a dose due to an On-body Injector failure or leakage, a new dose should be administered as soon as possible using a single prefilled syringe for manual use. What may interact with this medicine? Interactions have not been studied. Give your health care provider a list of all the medicines, herbs, non-prescription drugs, or dietary supplements you use. Also tell them if you smoke, drink alcohol, or use illegal drugs. Some items may interact with your medicine. This list may not describe all possible interactions. Give your health care provider a list of all the medicines, herbs, non-prescription drugs,  or dietary supplements you use. Also tell them if you smoke, drink alcohol, or use illegal drugs. Some items may interact with your medicine. What should I watch for while using this medicine? You may need blood work done while  you are taking this medicine. If you are going to need a MRI, CT scan, or other procedure, tell your doctor that you are using this medicine (On-Body Injector only). What side effects may I notice from receiving this medicine? Side effects that you should report to your doctor or health care professional as soon as possible: -allergic reactions like skin rash, itching or hives, swelling of the face, lips, or tongue -dizziness -fever -pain, redness, or irritation at site where injected -pinpoint red spots on the skin -shortness of breath or breathing problems -stomach or side pain, or pain at the shoulder -swelling -tiredness -trouble passing urine Side effects that usually do not require medical attention (report to your doctor or health care professional if they continue or are bothersome): -bone pain -muscle pain This list may not describe all possible side effects. Call your doctor for medical advice about side effects. You may report side effects to FDA at 1-800-FDA-1088. Where should I keep my medicine? Keep out of the reach of children. Store pre-filled syringes in a refrigerator between 2 and 8 degrees C (36 and 46 degrees F). Do not freeze. Keep in carton to protect from light. Throw away this medicine if it is left out of the refrigerator for more than 48 hours. Throw away any unused medicine after the expiration date. NOTE: This sheet is a summary. It may not cover all possible information. If you have questions about this medicine, talk to your doctor, pharmacist, or health care provider.  2015, [employer]. (2013-08-14 16:14:05)

## 2014-07-02 NOTE — Progress Notes (Signed)
Patient Care Team: Tivis Ringer, MD as PCP - General (Internal Medicine) [name], MD as Consulting Physician (General Surgery) Rulon Eisenmenger, MD as Consulting Physician (Hematology and Oncology) [name], MD as Consulting Physician (Radiation Oncology)  DIAGNOSIS: Breast cancer of upper-outer quadrant of right female breast   Staging form: Breast, AJCC 7th Edition     Clinical: Stage IA (T1b, N0, cM0) - Signed by Rulon Eisenmenger, MD on 03/20/2014     Pathologic: Stage IIB (T2, N1, cM0) - Unsigned   SUMMARY OF ONCOLOGIC HISTORY:   Breast cancer of upper-outer quadrant of right female breast   03/12/2014 Initial Biopsy Invasive ductal carcinoma with DCIS, right axillary lymph node biopsy negative, ER percent, PR 84%, Ki-67 16%, HER-2 negative   03/20/2014 Breast MRI Right breast upper outer quadrant 2.6 x 1.9 x 1.7 cm invasive ductal carcinoma with extension and involvement of the adjacent skin and nipple area complex, no abnormal lymph nodes   04/20/2014 Surgery Right breast lumpectomy: IDC grade 2; 2.5 cm with intermediate to high-grade DCIS margins negative: 1/3 positive sentinel lymph node with extracapsular extension, ER 100%, PR 84%, HER-2 negative ratio 1.22, Ki-67 16% T2, N1, M0 stage II B   06/04/2014 -  Chemotherapy Adjuvant chemotherapy dose dense Adriamycin and Cytoxan 4 followed by Taxol 12    CHIEF COMPLIANT:  Cycle 3 Adriamycin and Cytoxan  INTERVAL HISTORY: Mckenzie Sanford is a  59 year old lady with above-mentioned history of right-sided breast cancer reason adjuvant chemotherapy with dose dense Adriamycin and Cytoxan. Today cycle #3. After cycle 2 she had headaches for 4-5 days. They're not as severe as cycle 1. She also noticed some improved low back pain related to Neulasta. She was taking Claritin-D along with Tylenol and Benadryl all of which are helping her. She would like to take melatonin for sleep. She does not want to take Ativan.  REVIEW OF  SYSTEMS:   Constitutional: Denies fevers, chills or abnormal weight loss Eyes:  Dryness of eyes Ears, nose, mouth, throat, and face:  Dry mouth Respiratory: Denies cough, dyspnea or wheezes Cardiovascular: Denies palpitation, chest discomfort or lower extremity swelling Gastrointestinal:   Diarrhea alternating with constipation, patient has irritable bowel syndrome Skin: Denies abnormal skin rashes Lymphatics: Denies new lymphadenopathy or easy bruising Neurological:Denies numbness, tingling or new weaknesses Behavioral/Psych: Mood is stable, no new changes  All other systems were reviewed with the patient and are negative.  I have reviewed the past medical history, past surgical history, social history and family history with the patient and they are unchanged from previous note.  ALLERGIES:  is allergic to shellfish allergy; ivp dye; and penicillins.  MEDICATIONS:  Current Outpatient Prescriptions  Medication Sig Dispense Refill  . acetaminophen (TYLENOL) 325 MG tablet Take 325 mg by mouth 1 day or 1 dose.    Marland Kitchen amLODipine (NORVASC) 10 MG tablet Take 10 mg by mouth daily.    . Biotin 1000 MCG tablet Take 1,000 mcg by mouth 3 (three) times daily.    . cholecalciferol (VITAMIN D) 1000 UNITS tablet Take 1,000 Units by mouth daily.    . COD LIVER OIL PO Take by mouth.    Marland Kitchen HYDROcodone-acetaminophen (NORCO/VICODIN) 5-325 MG per tablet Take 1-2 tablets by mouth every 4 (four) hours as needed for moderate pain or severe pain. 30 tablet 0  . ibuprofen (ADVIL,MOTRIN) 200 MG tablet Take 400 mg by mouth every 6 (six) hours as needed.    . irbesartan (AVAPRO) 150 MG tablet     .  lidocaine-prilocaine (EMLA) cream Apply to affected area once 30 g 3  . Linaclotide (LINZESS PO) Take by mouth.    . loratadine (CLARITIN) 10 MG tablet Take 10 mg by mouth daily.    Marland Kitchen LORazepam (ATIVAN) 0.5 MG tablet Take 1 tablet (0.5 mg total) by mouth every 6 (six) hours as needed (Nausea or vomiting). 30 tablet 0  .  metoprolol succinate (TOPROL-XL) 50 MG 24 hr tablet Take 50 mg by mouth daily. Take with or immediately following a meal.    . Multiple Vitamin (MULTIVITAMIN) tablet Take 1 tablet by mouth daily.    . prochlorperazine (COMPAZINE) 10 MG tablet Take 1 tablet (10 mg total) by mouth every 6 (six) hours as needed (Nausea or vomiting). 30 tablet 1  . sertraline (ZOLOFT) 25 MG tablet at bedtime.   11  . simvastatin (ZOCOR) 40 MG tablet Take 40 mg by mouth every evening.     No current facility-administered medications for this visit.    PHYSICAL EXAMINATION: ECOG PERFORMANCE STATUS: 1 - Symptomatic but completely ambulatory  Filed Vitals:   07/02/14 1100  BP: 131/83  Pulse: 80  Temp: 98.4 F (36.9 C)  Resp: 18   Filed Weights   07/02/14 1100  Weight: 132 lb 14.4 oz (60.283 kg)    GENERAL:alert, no distress and comfortable SKIN: skin color, texture, turgor are normal, no rashes or significant lesions EYES: normal, Conjunctiva are pink and non-injected, sclera clear OROPHARYNX:no exudate, no erythema and lips, buccal mucosa, and tongue normal  NECK: supple, thyroid normal size, non-tender, without nodularity LYMPH:  no palpable lymphadenopathy in the cervical, axillary or inguinal LUNGS: clear to auscultation and percussion with normal breathing effort HEART: regular rate & rhythm and no murmurs and no lower extremity edema ABDOMEN:abdomen soft, non-tender and normal bowel sounds Musculoskeletal:no cyanosis of digits and no clubbing  NEURO: alert & oriented x 3 with fluent speech, no focal motor/sensory deficits   LABORATORY DATA:  I have reviewed the data as listed   Chemistry      Component Value Date/Time   NA 142 07/02/2014 1050   NA 139 05/28/2014 1330   K 3.4* 07/02/2014 1050   K 4.3 05/28/2014 1330   CL 105 05/28/2014 1330   CO2 26 07/02/2014 1050   CO2 28 05/28/2014 1330   BUN 8.0 07/02/2014 1050   BUN 11 05/28/2014 1330   CREATININE 0.7 07/02/2014 1050    CREATININE 0.75 05/28/2014 1330   CREATININE 0.72 12/11/2011 1238      Component Value Date/Time   CALCIUM 9.5 07/02/2014 1050   CALCIUM 9.6 05/28/2014 1330   ALKPHOS 165* 07/02/2014 1050   ALKPHOS 134* 04/16/2014 1430   AST 17 07/02/2014 1050   AST 19 04/16/2014 1430   ALT 22 07/02/2014 1050   ALT 24 04/16/2014 1430   BILITOT <0.20 07/02/2014 1050   BILITOT <0.2* 04/16/2014 1430       Lab Results  Component Value Date   WBC 13.8* 07/02/2014   HGB 12.4 07/02/2014   HCT 37.5 07/02/2014   MCV 89.5 07/02/2014   PLT 195 07/02/2014   NEUTROABS 11.5* 07/02/2014   ASSESSMENT & PLAN:  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 Severe headaches:  Improved compared to cycle one but she still had headaches with cycle 2 that lasted for 4-5 days. She has taken Tylenol and Benadryl for headaches. 2. Severe back and chest wall pain related to Neulasta:  Patient has not required hydrocodone for the back pain. She'll take Tylenol along with Claritin-D and that seems to be helping. 3. Alopecia  Monitoring closely for chemotoxicities.  Return  To chemotherapy infusion in 2 weeks for cycle 4 and then subsequently 2 weeks later for clinic follow-up with first Taxol treatment     No orders of the defined types were placed in this encounter.   The patient has a good understanding of the overall plan. she agrees with it. She will call with any problems that may develop before her next visit here.   Rulon Eisenmenger, MD

## 2014-07-02 NOTE — Telephone Encounter (Signed)
Per staff message and POF I have scheduled appts. Advised scheduler of appts. JMW  

## 2014-07-02 NOTE — Progress Notes (Signed)
Positive blood return noted before, every 5 mL during and after adriamycin administration.

## 2014-07-02 NOTE — Assessment & Plan Note (Signed)
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 sentinel lymph nodes with extracapsular extension, ER 100%, PR 84%, HER-2 negative, Ki-67 16%, T2a N1 M0 stage IIB  Current treatment:Adjuvant systemic chemotherapy with dose dense Adriamycin and Cytoxan x4 cycles followed by weekly Taxol x12  Today cycle 3 day 1 of dose dense Adriamycin and Cytoxan  Chemotherapy toxicities: 1. Severe headaches:  Improved compared to cycle one but she still had headaches with cycle 2 that lasted for 4-5 days. She has taken Tylenol and Benadryl for headaches. 2. Severe back and chest wall pain related to Neulasta:  Patient has not required hydrocodone for the back pain. She'll take Tylenol along with Claritin-D and that seems to be helping. 3. Alopecia  Monitoring closely for chemotoxicities.  Return  To chemotherapy infusion in 2 weeks for cycle 4 and then subsequently 2 weeks later for clinic follow-up with first Taxol treatment 

## 2014-07-02 NOTE — Progress Notes (Signed)
Met with pt to assess tolerance to chemotherapy. Pt relate she is doing well. Denies needs at this time. Encourage pt to call with future questions or concerns. Received verbal understanding.

## 2014-07-02 NOTE — Telephone Encounter (Signed)
per pof to sch pt appt-sent MW email to sch trmt-pt to get updated sch b4 leaving °

## 2014-07-03 ENCOUNTER — Ambulatory Visit: Payer: Medicare Other

## 2014-07-09 ENCOUNTER — Telehealth: Payer: Self-pay | Admitting: *Deleted

## 2014-07-09 NOTE — Telephone Encounter (Signed)
Pt called with some concerns regarding nausea and "bumps around vagina". Instructed pt on how to use anti-nausea medications. Per Dr. [name] pt to use 1% hydrocortisone to the affected external area. Gave pt instructions. Received verbal understanding. Encourage pt to call back with needs or further questions.

## 2014-07-16 ENCOUNTER — Ambulatory Visit (HOSPITAL_BASED_OUTPATIENT_CLINIC_OR_DEPARTMENT_OTHER): Payer: 59

## 2014-07-16 ENCOUNTER — Other Ambulatory Visit: Payer: 59

## 2014-07-16 ENCOUNTER — Other Ambulatory Visit: Payer: Self-pay

## 2014-07-16 ENCOUNTER — Telehealth: Payer: Self-pay

## 2014-07-16 ENCOUNTER — Other Ambulatory Visit (HOSPITAL_BASED_OUTPATIENT_CLINIC_OR_DEPARTMENT_OTHER): Payer: 59

## 2014-07-16 DIAGNOSIS — C50411 Malignant neoplasm of upper-outer quadrant of right female breast: Secondary | ICD-10-CM

## 2014-07-16 DIAGNOSIS — Z5111 Encounter for antineoplastic chemotherapy: Secondary | ICD-10-CM

## 2014-07-16 LAB — CBC WITH DIFFERENTIAL/PLATELET
BASO%: 1.1 % (ref 0.0–2.0)
Basophils Absolute: 0.1 10*3/uL (ref 0.0–0.1)
EOS%: 0.5 % (ref 0.0–7.0)
Eosinophils Absolute: 0.1 10*3/uL (ref 0.0–0.5)
HCT: 35.2 % (ref 34.8–46.6)
HGB: 11.7 g/dL (ref 11.6–15.9)
LYMPH#: 0.8 10*3/uL — AB (ref 0.9–3.3)
LYMPH%: 8 % — AB (ref 14.0–49.7)
MCH: 29.8 pg (ref 25.1–34.0)
MCHC: 33.2 g/dL (ref 31.5–36.0)
MCV: 89.8 fL (ref 79.5–101.0)
MONO#: 1.3 10*3/uL — ABNORMAL HIGH (ref 0.1–0.9)
MONO%: 12.1 % (ref 0.0–14.0)
NEUT#: 8.1 10*3/uL — ABNORMAL HIGH (ref 1.5–6.5)
NEUT%: 78.3 % — AB (ref 38.4–76.8)
NRBC: 2 % — AB (ref 0–0)
Platelets: 272 10*3/uL (ref 145–400)
RBC: 3.92 10*6/uL (ref 3.70–5.45)
RDW: 14.6 % — ABNORMAL HIGH (ref 11.2–14.5)
WBC: 10.4 10*3/uL — ABNORMAL HIGH (ref 3.9–10.3)

## 2014-07-16 LAB — COMPREHENSIVE METABOLIC PANEL (CC13)
ALK PHOS: 162 U/L — AB (ref 40–150)
ALT: 23 U/L (ref 0–55)
ANION GAP: 10 meq/L (ref 3–11)
AST: 18 U/L (ref 5–34)
Albumin: 4 g/dL (ref 3.5–5.0)
BUN: 6.4 mg/dL — AB (ref 7.0–26.0)
CO2: 27 mEq/L (ref 22–29)
CREATININE: 0.7 mg/dL (ref 0.6–1.1)
Calcium: 10 mg/dL (ref 8.4–10.4)
Chloride: 106 mEq/L (ref 98–109)
EGFR: >90 mL/min/{1.73_m2} (ref >90)
Glucose: 108 mg/dl (ref 70–140)
Potassium: 3.5 mEq/L (ref 3.5–5.1)
Sodium: 143 mEq/L (ref 136–145)
Total Bilirubin: <0.2 mg/dL (ref 0.20–1.20)
Total Protein: 7 g/dL (ref 6.4–8.3)

## 2014-07-16 MED ORDER — DOXORUBICIN HCL CHEMO IV INJECTION 2 MG/ML
60.0000 mg/m2 | Freq: Once | INTRAVENOUS | Status: AC
  Administered 2014-07-16: 96 mg via INTRAVENOUS
  Filled 2014-07-16: qty 48

## 2014-07-16 MED ORDER — SODIUM CHLORIDE 0.9 % IV SOLN
Freq: Once | INTRAVENOUS | Status: AC
  Administered 2014-07-16: 13:00:00 via INTRAVENOUS

## 2014-07-16 MED ORDER — SODIUM CHLORIDE 0.9 % IJ SOLN
10.0000 mL | INTRAMUSCULAR | Status: DC | PRN
  Administered 2014-07-16: 10 mL
  Filled 2014-07-16: qty 10

## 2014-07-16 MED ORDER — PALONOSETRON HCL INJECTION 0.25 MG/5ML
0.2500 mg | Freq: Once | INTRAVENOUS | Status: AC
  Administered 2014-07-16: 0.25 mg via INTRAVENOUS

## 2014-07-16 MED ORDER — PALONOSETRON HCL INJECTION 0.25 MG/5ML
INTRAVENOUS | Status: AC
  Filled 2014-07-16: qty 5

## 2014-07-16 MED ORDER — PEGFILGRASTIM 6 MG/0.6ML ~~LOC~~ PSKT
6.0000 mg | PREFILLED_SYRINGE | Freq: Once | SUBCUTANEOUS | Status: AC
  Administered 2014-07-16: 6 mg via SUBCUTANEOUS
  Filled 2014-07-16: qty 0.6

## 2014-07-16 MED ORDER — SODIUM CHLORIDE 0.9 % IV SOLN
600.0000 mg/m2 | Freq: Once | INTRAVENOUS | Status: AC
  Administered 2014-07-16: 960 mg via INTRAVENOUS
  Filled 2014-07-16: qty 48

## 2014-07-16 MED ORDER — SODIUM CHLORIDE 0.9 % IV SOLN
150.0000 mg | Freq: Once | INTRAVENOUS | Status: AC
  Administered 2014-07-16: 150 mg via INTRAVENOUS
  Filled 2014-07-16: qty 5

## 2014-07-16 MED ORDER — HEPARIN SOD (PORK) LOCK FLUSH 100 UNIT/ML IV SOLN
500.0000 [IU] | Freq: Once | INTRAVENOUS | Status: AC | PRN
Start: 2014-07-16 — End: 2014-07-16
  Administered 2014-07-16: 500 [IU]
  Filled 2014-07-16: qty 5

## 2014-07-16 NOTE — Patient Instructions (Signed)
[facility] Discharge Instructions for Patients Receiving Chemotherapy  Today you received the following chemotherapy agents adriamycin/cytoxan  To help prevent nausea and vomiting after your treatment, we encourage you to take your nausea medication as directed  If you develop nausea and vomiting that is not controlled by your nausea medication, call the clinic.   BELOW ARE SYMPTOMS THAT SHOULD BE REPORTED IMMEDIATELY:  *FEVER GREATER THAN 100.5 F  *CHILLS WITH OR WITHOUT FEVER  NAUSEA AND VOMITING THAT IS NOT CONTROLLED WITH YOUR NAUSEA MEDICATION  *UNUSUAL SHORTNESS OF BREATH  *UNUSUAL BRUISING OR BLEEDING  TENDERNESS IN MOUTH AND THROAT WITH OR WITHOUT PRESENCE OF ULCERS  *URINARY PROBLEMS  *BOWEL PROBLEMS  UNUSUAL RASH Items with * indicate a potential emergency and should be followed up as soon as possible.  Feel free to call the clinic you have any questions or concerns. The clinic phone number is [phone].  

## 2014-07-20 ENCOUNTER — Telehealth: Payer: Self-pay

## 2014-07-20 NOTE — Telephone Encounter (Signed)
Patient called reporting "second attempt at returning a call to [place] from last week.  I have a FMLA form I've been waiting for."  Call transferred to collaborative nurse.

## 2014-07-20 NOTE — Telephone Encounter (Signed)
[employer] continuance of disability form - completed, signed by MD, left at reception desk for patient pickup.  Pt voiced understanding.  Copy sent to scan.

## 2014-07-23 ENCOUNTER — Telehealth: Payer: Self-pay | Admitting: *Deleted

## 2014-07-23 ENCOUNTER — Telehealth: Payer: Self-pay | Admitting: Hematology and Oncology

## 2014-07-23 ENCOUNTER — Encounter: Payer: Self-pay | Admitting: Nurse Practitioner

## 2014-07-23 ENCOUNTER — Ambulatory Visit (HOSPITAL_BASED_OUTPATIENT_CLINIC_OR_DEPARTMENT_OTHER): Payer: 59

## 2014-07-23 ENCOUNTER — Other Ambulatory Visit: Payer: Self-pay | Admitting: Nurse Practitioner

## 2014-07-23 ENCOUNTER — Ambulatory Visit (HOSPITAL_BASED_OUTPATIENT_CLINIC_OR_DEPARTMENT_OTHER): Payer: 59 | Admitting: Nurse Practitioner

## 2014-07-23 VITALS — BP 139/81 | HR 73 | Temp 98.6°F | Resp 14 | Wt 129.4 lb

## 2014-07-23 VITALS — BP 143/73 | HR 83 | Temp 97.7°F | Resp 18

## 2014-07-23 DIAGNOSIS — E860 Dehydration: Secondary | ICD-10-CM

## 2014-07-23 DIAGNOSIS — R112 Nausea with vomiting, unspecified: Secondary | ICD-10-CM

## 2014-07-23 DIAGNOSIS — Z452 Encounter for adjustment and management of vascular access device: Secondary | ICD-10-CM

## 2014-07-23 DIAGNOSIS — C50411 Malignant neoplasm of upper-outer quadrant of right female breast: Secondary | ICD-10-CM

## 2014-07-23 DIAGNOSIS — K219 Gastro-esophageal reflux disease without esophagitis: Secondary | ICD-10-CM

## 2014-07-23 DIAGNOSIS — D709 Neutropenia, unspecified: Secondary | ICD-10-CM

## 2014-07-23 HISTORY — DX: Nausea with vomiting, unspecified: R11.2

## 2014-07-23 LAB — COMPREHENSIVE METABOLIC PANEL (CC13)
ALBUMIN: 3.6 g/dL (ref 3.5–5.0)
ALT: 9 U/L (ref 0–55)
AST: 13 U/L (ref 5–34)
Alkaline Phosphatase: 175 U/L — ABNORMAL HIGH (ref 40–150)
Anion Gap: 9 mEq/L (ref 3–11)
BUN: 9.7 mg/dL (ref 7.0–26.0)
CALCIUM: 9.9 mg/dL (ref 8.4–10.4)
CO2: 24 mEq/L (ref 22–29)
Chloride: 103 mEq/L (ref 98–109)
Creatinine: 0.6 mg/dL (ref 0.6–1.1)
EGFR: >90 mL/min/{1.73_m2} (ref >90)
Glucose: 97 mg/dl (ref 70–140)
POTASSIUM: 3.9 meq/L (ref 3.5–5.1)
Sodium: 136 mEq/L (ref 136–145)
TOTAL PROTEIN: 6.6 g/dL (ref 6.4–8.3)
Total Bilirubin: 0.23 mg/dL (ref 0.20–1.20)

## 2014-07-23 LAB — CBC WITH DIFFERENTIAL/PLATELET
BASO%: 3 % — AB (ref 0.0–2.0)
Basophils Absolute: 0 10*3/uL (ref 0.0–0.1)
EOS%: 0 % (ref 0.0–7.0)
Eosinophils Absolute: 0 10*3/uL (ref 0.0–0.5)
HEMATOCRIT: 29.8 % — AB (ref 34.8–46.6)
HEMOGLOBIN: 10.1 g/dL — AB (ref 11.6–15.9)
LYMPH#: 0.4 10*3/uL — AB (ref 0.9–3.3)
LYMPH%: 27.3 % (ref 14.0–49.7)
MCH: 29.8 pg (ref 25.1–34.0)
MCHC: 33.9 g/dL (ref 31.5–36.0)
MCV: 87.9 fL (ref 79.5–101.0)
MONO#: 0.2 10*3/uL (ref 0.1–0.9)
MONO%: 14.4 % — ABNORMAL HIGH (ref 0.0–14.0)
NEUT#: 0.7 10*3/uL — ABNORMAL LOW (ref 1.5–6.5)
NEUT%: 55.3 % (ref 38.4–76.8)
Platelets: 235 10*3/uL (ref 145–400)
RBC: 3.39 10*6/uL — ABNORMAL LOW (ref 3.70–5.45)
RDW: 14.5 % (ref 11.2–14.5)
WBC: 1.3 10*3/uL — AB (ref 3.9–10.3)
nRBC: 0 % (ref 0–0)

## 2014-07-23 MED ORDER — ONDANSETRON 8 MG/50ML IVPB (CHCC)
8.0000 mg | Freq: Once | INTRAVENOUS | Status: AC
  Administered 2014-07-23: 8 mg via INTRAVENOUS

## 2014-07-23 MED ORDER — ONDANSETRON 8 MG/NS 50 ML IVPB
INTRAVENOUS | Status: AC
  Filled 2014-07-23: qty 8

## 2014-07-23 MED ORDER — ALTEPLASE 2 MG IJ SOLR
2.0000 mg | Freq: Once | INTRAMUSCULAR | Status: AC | PRN
  Administered 2014-07-23: 2 mg
  Filled 2014-07-23: qty 2

## 2014-07-23 MED ORDER — SODIUM CHLORIDE 0.9 % IV SOLN
INTRAVENOUS | Status: AC
  Administered 2014-07-23: 16:00:00 via INTRAVENOUS

## 2014-07-23 NOTE — Telephone Encounter (Signed)
Pt called c/o vomiting and the inability to keep fluids "down". Called Cy[facility] [place] to relay complaint. Pt to come in to [facility]. Gave pt information. Pt coming right away to be assessed and given fluids.

## 2014-07-23 NOTE — Progress Notes (Signed)
SYMPTOM MANAGEMENT CLINIC   HPI: Mckenzie Sanford 59 y.o. female diagnosed with breast cancer.  Patient is status post lumpectomy in November 2015.  Patient just completed dose dense AC chemotherapy last week; and is scheduled to initiate weekly Taxol chemotherapy next week.  Patient called the cancer Center requesting urgent care visit.  Patient states she has been suffering with some chronic nausea and intermittent vomiting since initiating her chemotherapy.  She takes Compazine and/or Ativan with only occasional effectiveness.  She states she has vomited a total of 6 times within the past 1-2 weeks.  She vomited once this morning; and feels slightly dehydrated.  She is also complaining of some occasional burping and reflux symptoms.  She denies any recent fevers or chills.   HPI  ROS  Past Medical History  Diagnosis Date  . HTN (hypertension)   . Allergic rhinitis   . Chest pain      ETT 11/09 - low risk (R wave amplitude increased) note echo above  . Hypercholesteremia   . Palpitations      echocardiogram 2006-normal EF, trace TR  . IBS (irritable bowel syndrome)   . Breast cancer 03/12/14    right/invasive ductal carcinoma, DCIS  . Wears glasses   . Depression     Past Surgical History  Procedure Laterality Date  . Dilation and curettage of uterus    . Cyst excision      right axilla  . Colonoscopy    . Tonsillectomy    . Radioactive seed guided mastectomy with axillary sentinel lymph node biopsy Right 04/20/2014    Procedure: RIGHT BREAST RADIOACTIVE SEED GUIDED LUMPECTOMY WITH RIGHT AXILLARY SENTINEL LYMPH NODE BIOPSY;  Surgeon: [name], MD;  Location: [facility];  Service: General;  Laterality: Right;  . Portacath placement Right 06/01/2014    Procedure: ULTRASOUND GUIDED INSERTION PORT-A-CATH;  Surgeon: [name], MD;  Location: [facility];  Service: General;  Laterality: Right;    has CONSTIPATION;  FLATULENCE-GAS-BLOATING; Hypertension; Hyperlipidemia; Palpitations; Breast cancer of upper-outer quadrant of right female breast; Nausea with vomiting; Dehydration; GERD (gastroesophageal reflux disease); and Neutropenia on her problem list.    is allergic to shellfish allergy; ivp dye; and penicillins.    Medication List       This list is accurate as of: 07/23/14  5:11 PM.  Always use your most recent med list.               acetaminophen 325 MG tablet  Commonly known as:  TYLENOL  Take 325 mg by mouth 1 day or 1 dose.     amLODipine 10 MG tablet  Commonly known as:  NORVASC  Take 10 mg by mouth daily.     HYDROcodone-acetaminophen 5-325 MG per tablet  Commonly known as:  NORCO/VICODIN  Take 1-2 tablets by mouth every 4 (four) hours as needed for moderate pain or severe pain.     ibuprofen 200 MG tablet  Commonly known as:  ADVIL,MOTRIN  Take 400 mg by mouth every 6 (six) hours as needed.     irbesartan 150 MG tablet  Commonly known as:  AVAPRO     lidocaine-prilocaine cream  Commonly known as:  EMLA  Apply to affected area once     LINZESS 290 MCG Caps capsule  Generic drug:  Linaclotide  Take 290 mcg by mouth daily as needed.     loratadine 10 MG tablet  Commonly known as:  CLARITIN  Take 10 mg by  mouth daily.     LORazepam 0.5 MG tablet  Commonly known as:  ATIVAN  Take 1 tablet (0.5 mg total) by mouth every 6 (six) hours as needed (Nausea or vomiting).     metoprolol succinate 50 MG 24 hr tablet  Commonly known as:  TOPROL-XL  Take 50 mg by mouth daily. Take with or immediately following a meal.     prochlorperazine 10 MG tablet  Commonly known as:  COMPAZINE  Take 1 tablet (10 mg total) by mouth every 6 (six) hours as needed (Nausea or vomiting).     sertraline 25 MG tablet  Commonly known as:  ZOLOFT  at bedtime.     simvastatin 40 MG tablet  Commonly known as:  ZOCOR  Take 40 mg by mouth every evening.         PHYSICAL EXAMINATION  Blood  pressure 139/81, pulse 73, temperature 98.6 F (37 C), temperature source Oral, resp. rate 14, weight 129 lb 6.4 oz (58.695 kg), last menstrual period 09/26/2011.  Physical Exam  Constitutional: She is oriented to person, place, and time. Vital signs are normal. She appears dehydrated. She appears unhealthy.  HENT:  Head: Normocephalic and atraumatic.  Mouth/Throat: Oropharynx is clear and moist.  Eyes: Conjunctivae and EOM are normal. Pupils are equal, round, and reactive to light. Right eye exhibits no discharge. Left eye exhibits no discharge. No scleral icterus.  Neck: Normal range of motion. Neck supple.  Pulmonary/Chest: Effort normal. No respiratory distress.  Musculoskeletal: Normal range of motion.  Neurological: She is alert and oriented to person, place, and time. Gait normal.  Skin: Skin is warm and dry.  Psychiatric: Affect normal.  Nursing note and vitals reviewed.   LABORATORY DATA:. Appointment on 07/23/2014  Component Date Value Ref Range Status  . WBC 07/23/2014 1.3* 3.9 - 10.3 10e3/uL Final  . NEUT# 07/23/2014 0.7* 1.5 - 6.5 10e3/uL Final  . HGB 07/23/2014 10.1* 11.6 - 15.9 g/dL Final  . HCT 07/23/2014 29.8* 34.8 - 46.6 % Final  . Platelets 07/23/2014 235  145 - 400 10e3/uL Final  . MCV 07/23/2014 87.9  79.5 - 101.0 fL Final  . MCH 07/23/2014 29.8  25.1 - 34.0 pg Final  . MCHC 07/23/2014 33.9  31.5 - 36.0 g/dL Final  . RBC 07/23/2014 3.39* 3.70 - 5.45 10e6/uL Final  . RDW 07/23/2014 14.5  11.2 - 14.5 % Final  . lymph# 07/23/2014 0.4* 0.9 - 3.3 10e3/uL Final  . MONO# 07/23/2014 0.2  0.1 - 0.9 10e3/uL Final  . Eosinophils Absolute 07/23/2014 0.0  0.0 - 0.5 10e3/uL Final  . Basophils Absolute 07/23/2014 0.0  0.0 - 0.1 10e3/uL Final  . NEUT% 07/23/2014 55.3  38.4 - 76.8 % Final  . LYMPH% 07/23/2014 27.3  14.0 - 49.7 % Final  . MONO% 07/23/2014 14.4* 0.0 - 14.0 % Final  . EOS% 07/23/2014 0.0  0.0 - 7.0 % Final  . BASO% 07/23/2014 3.0* 0.0 - 2.0 % Final  . nRBC  07/23/2014 0  0 - 0 % Final  . Sodium 07/23/2014 136  136 - 145 mEq/L Final  . Potassium 07/23/2014 3.9  3.5 - 5.1 mEq/L Final  . Chloride 07/23/2014 103  98 - 109 mEq/L Final  . CO2 07/23/2014 24  22 - 29 mEq/L Final  . Glucose 07/23/2014 97  70 - 140 mg/dl Final  . BUN 07/23/2014 9.7  7.0 - 26.0 mg/dL Final  . Creatinine 07/23/2014 0.6  0.6 - 1.1 mg/dL Final  .  Total Bilirubin 07/23/2014 0.23  0.20 - 1.20 mg/dL Final  . Alkaline Phosphatase 07/23/2014 175* 40 - 150 U/L Final  . AST 07/23/2014 13  5 - 34 U/L Final  . ALT 07/23/2014 9  0 - 55 U/L Final  . Total Protein 07/23/2014 6.6  6.4 - 8.3 g/dL Final  . Albumin 07/23/2014 3.6  3.5 - 5.0 g/dL Final  . Calcium 07/23/2014 9.9  8.4 - 10.4 mg/dL Final  . Anion Gap 07/23/2014 9  3 - 11 mEq/L Final  . EGFR 07/23/2014 >90  >90 ml/min/1.73 m2 Final   eGFR is calculated using the CKD-EPI Creatinine Equation (2009)     RADIOGRAPHIC STUDIES: No results found.  ASSESSMENT/PLAN:    Breast cancer of upper-outer quadrant of right female breast Patient is status post lumpectomy in November 2015.  She just completed her last cycle of dose dense AC chemotherapy on 07/16/2014.  She is scheduled to return on 07/30/2014 for her first weekly Taxol chemotherapy.   Dehydration Patient states that she has been suffering with some chronic nausea and occasional vomiting since initiating her chemotherapy.  She states that she has vomited a total of 6 times within the past 1-2 weeks.  She vomited once today.  She feels slightly dehydrated; but states she continues to try to drink and eat as much as possible.  Patient will receive 1 L normal saline IV fluid rehydration today while at the cancer center.  She was also encouraged to push fluids is much as possible.   GERD (gastroesophageal reflux disease) Patient is also complaining of some burping and reflux symptoms.  Advised that she try taking Prilosec over-the-counter as directed.   Nausea with  vomiting Patient states that she has been suffering with some chronic nausea and occasional vomiting since initiating her chemotherapy.  She states that she has vomited a total of 6 times within the past 1-2 weeks.  She vomited once today.  She feels slightly dehydrated; but states she continues to try to drink and eat as much as possible.  Patient states that she has both Compazine and Ativan at home; but it only occasionally helps.  Patient was given Zofran 8 mg IV while at the [facility] for her nausea.     Neutropenia Patient's last dose dense AC chemotherapy was this past Thursday, 09/14/2014.  She is now neutropenic; with an ANC of 0.7.  Briefly reviewed all neutropenia guidelines with both patient and her husband today; and also gave him educational printouts regarding neutropenia.  Did confirm the patient received her Neulasta injection following her last chemotherapy.   Patient stated understanding of all instructions; and was in agreement with this plan of care. The patient knows to call the clinic with any problems, questions or concerns.   Review/collaboration with Dr. [name] regarding all aspects of patient's visit today.   Total time spent with patient was 25 minutes;  with greater than 75 percent of that time spent in face to face counseling regarding patient's symptoms,  and coordination of care and follow up.  Disclaimer: This note was dictated with voice recognition software. Similar sounding words can inadvertently be transcribed and may not be corrected upon review.   Drue Second, NP 07/23/2014

## 2014-07-23 NOTE — Assessment & Plan Note (Signed)
Patient states that she has been suffering with some chronic nausea and occasional vomiting since initiating her chemotherapy.  She states that she has vomited a total of 6 times within the past 1-2 weeks.  She vomited once today.  She feels slightly dehydrated; but states she continues to try to drink and eat as much as possible.  Patient will receive 1 L normal saline IV fluid rehydration today while at the cancer center.  She was also encouraged to push fluids is much as possible.

## 2014-07-23 NOTE — Patient Instructions (Signed)
Dehydration, Adult Dehydration is when you lose more fluids from the body than you take in. Vital organs like the kidneys, brain, and heart cannot function without a proper amount of fluids and salt. Any loss of fluids from the body can cause dehydration.  CAUSES   Vomiting.  Diarrhea.  Excessive sweating.  Excessive urine output.  Fever. SYMPTOMS  Mild dehydration  Thirst.  Dry lips.  Slightly dry mouth. Moderate dehydration  Very dry mouth.  Sunken eyes.  Skin does not bounce back quickly when lightly pinched and released.  Dark urine and decreased urine production.  Decreased tear production.  Headache. Severe dehydration  Very dry mouth.  Extreme thirst.  Rapid, weak pulse (more than 100 beats per minute at rest).  Cold hands and feet.  Not able to sweat in spite of heat and temperature.  Rapid breathing.  Blue lips.  Confusion and lethargy.  Difficulty being awakened.  Minimal urine production.  No tears. DIAGNOSIS  Your caregiver will diagnose dehydration based on your symptoms and your exam. Blood and urine tests will help confirm the diagnosis. The diagnostic evaluation should also identify the cause of dehydration. TREATMENT  Treatment of mild or moderate dehydration can often be done at home by increasing the amount of fluids that you drink. It is best to drink small amounts of fluid more often. Drinking too much at one time can make vomiting worse. Refer to the home care instructions below. Severe dehydration needs to be treated at the hospital where you will probably be given intravenous (IV) fluids that contain water and electrolytes. HOME CARE INSTRUCTIONS   Ask your caregiver about specific rehydration instructions.  Drink enough fluids to keep your urine clear or pale yellow.  Drink small amounts frequently if you have nausea and vomiting.  Eat as you normally do.  Avoid:  Foods or drinks high in sugar.  Carbonated  drinks.  Juice.  Extremely hot or cold fluids.  Drinks with caffeine.  Fatty, greasy foods.  Alcohol.  Tobacco.  Overeating.  Gelatin desserts.  Wash your hands well to avoid spreading bacteria and viruses.  Only take over-the-counter or prescription medicines for pain, discomfort, or fever as directed by your caregiver.  Ask your caregiver if you should continue all prescribed and over-the-counter medicines.  Keep all follow-up appointments with your caregiver. SEEK MEDICAL CARE IF:  You have abdominal pain and it increases or stays in one area (localizes).  You have a rash, stiff neck, or severe headache.  You are irritable, sleepy, or difficult to awaken.  You are weak, dizzy, or extremely thirsty. SEEK IMMEDIATE MEDICAL CARE IF:   You are unable to keep fluids down or you get worse despite treatment.  You have frequent episodes of vomiting or diarrhea.  You have blood or green matter (bile) in your vomit.  You have blood in your stool or your stool looks black and tarry.  You have not urinated in 6 to 8 hours, or you have only urinated a small amount of very dark urine.  You have a fever.  You faint. MAKE SURE YOU:   Understand these instructions.  Will watch your condition.  Will get help right away if you are not doing well or get worse. Document Released: 05/15/2005 Document Revised: 08/07/2011 Document Reviewed: 01/02/2011 Exit[employer] Patient Information 2015 [employer]. This information is not intended to replace advice given to you by your health care provider. Make sure you discuss any questions you have with your health care   provider.  

## 2014-07-23 NOTE — Assessment & Plan Note (Signed)
Patient states that she has been suffering with some chronic nausea and occasional vomiting since initiating her chemotherapy.  She states that she has vomited a total of 6 times within the past 1-2 weeks.  She vomited once today.  She feels slightly dehydrated; but states she continues to try to drink and eat as much as possible.  Patient states that she has both Compazine and Ativan at home; but it only occasionally helps.  Patient was given Zofran 8 mg IV while at the [place] for her nausea.

## 2014-07-23 NOTE — Telephone Encounter (Signed)
added appt per pof....pt on her way per pof

## 2014-07-23 NOTE — Assessment & Plan Note (Signed)
Patient's last dose dense AC chemotherapy was this past Thursday, 09/14/2014.  She is now neutropenic; with an ANC of 0.7.  Briefly reviewed all neutropenia guidelines with both patient and her husband today; and also gave him educational printouts regarding neutropenia.  Did confirm the patient received her Neulasta injection following her last chemotherapy.

## 2014-07-23 NOTE — Assessment & Plan Note (Signed)
Patient is also complaining of some burping and reflux symptoms.  Advised that she try taking Prilosec over-the-counter as directed.

## 2014-07-23 NOTE — Assessment & Plan Note (Signed)
Patient is status post lumpectomy in November 2015.  She just completed her last cycle of dose dense AC chemotherapy on 07/16/2014.  She is scheduled to return on 07/30/2014 for her first weekly Taxol chemotherapy.

## 2014-07-30 ENCOUNTER — Ambulatory Visit (HOSPITAL_BASED_OUTPATIENT_CLINIC_OR_DEPARTMENT_OTHER): Payer: 59

## 2014-07-30 ENCOUNTER — Other Ambulatory Visit: Payer: Self-pay

## 2014-07-30 ENCOUNTER — Other Ambulatory Visit (HOSPITAL_BASED_OUTPATIENT_CLINIC_OR_DEPARTMENT_OTHER): Payer: 59

## 2014-07-30 ENCOUNTER — Telehealth: Payer: Self-pay | Admitting: Hematology and Oncology

## 2014-07-30 ENCOUNTER — Ambulatory Visit (HOSPITAL_BASED_OUTPATIENT_CLINIC_OR_DEPARTMENT_OTHER): Payer: 59 | Admitting: Hematology and Oncology

## 2014-07-30 VITALS — BP 136/79 | HR 84 | Temp 97.6°F | Resp 18 | Ht 62.0 in | Wt 127.9 lb

## 2014-07-30 DIAGNOSIS — C50411 Malignant neoplasm of upper-outer quadrant of right female breast: Secondary | ICD-10-CM

## 2014-07-30 DIAGNOSIS — M549 Dorsalgia, unspecified: Secondary | ICD-10-CM

## 2014-07-30 DIAGNOSIS — Z170 Estrogen receptor positive status [ER+]: Secondary | ICD-10-CM

## 2014-07-30 DIAGNOSIS — R0789 Other chest pain: Secondary | ICD-10-CM

## 2014-07-30 DIAGNOSIS — Z5111 Encounter for antineoplastic chemotherapy: Secondary | ICD-10-CM

## 2014-07-30 DIAGNOSIS — R51 Headache: Secondary | ICD-10-CM

## 2014-07-30 LAB — COMPREHENSIVE METABOLIC PANEL (CC13)
ALT: 18 U/L (ref 0–55)
AST: 17 U/L (ref 5–34)
Albumin: 3.8 g/dL (ref 3.5–5.0)
Alkaline Phosphatase: 159 U/L — ABNORMAL HIGH (ref 40–150)
Anion Gap: 11 mEq/L (ref 3–11)
BUN: 7.7 mg/dL (ref 7.0–26.0)
CALCIUM: 10 mg/dL (ref 8.4–10.4)
CO2: 27 meq/L (ref 22–29)
CREATININE: 0.7 mg/dL (ref 0.6–1.1)
Chloride: 106 mEq/L (ref 98–109)
EGFR: >90 mL/min/{1.73_m2} (ref >90)
Glucose: 94 mg/dl (ref 70–140)
POTASSIUM: 3.4 meq/L — AB (ref 3.5–5.1)
Sodium: 144 mEq/L (ref 136–145)
Total Bilirubin: 0.21 mg/dL (ref 0.20–1.20)
Total Protein: 6.9 g/dL (ref 6.4–8.3)

## 2014-07-30 LAB — CBC WITH DIFFERENTIAL/PLATELET
BASO%: 0.6 % (ref 0.0–2.0)
Basophils Absolute: 0.1 10*3/uL (ref 0.0–0.1)
EOS ABS: 0 10*3/uL (ref 0.0–0.5)
EOS%: 0.3 % (ref 0.0–7.0)
HEMATOCRIT: 33.5 % — AB (ref 34.8–46.6)
HEMOGLOBIN: 11.1 g/dL — AB (ref 11.6–15.9)
LYMPH%: 2.9 % — AB (ref 14.0–49.7)
MCH: 30.1 pg (ref 25.1–34.0)
MCHC: 33.1 g/dL (ref 31.5–36.0)
MCV: 90.8 fL (ref 79.5–101.0)
MONO#: 2 10*3/uL — ABNORMAL HIGH (ref 0.1–0.9)
MONO%: 16.5 % — AB (ref 0.0–14.0)
NEUT%: 79.7 % — ABNORMAL HIGH (ref 38.4–76.8)
NEUTROS ABS: 9.4 10*3/uL — AB (ref 1.5–6.5)
Platelets: 253 10*3/uL (ref 145–400)
RBC: 3.69 10*6/uL — ABNORMAL LOW (ref 3.70–5.45)
RDW: 16.1 % — AB (ref 11.2–14.5)
WBC: 11.8 10*3/uL — ABNORMAL HIGH (ref 3.9–10.3)
lymph#: 0.3 10*3/uL — ABNORMAL LOW (ref 0.9–3.3)
nRBC: 1 % — ABNORMAL HIGH (ref 0–0)

## 2014-07-30 MED ORDER — SODIUM CHLORIDE 0.9 % IJ SOLN
10.0000 mL | INTRAMUSCULAR | Status: DC | PRN
  Administered 2014-07-30: 10 mL
  Filled 2014-07-30: qty 10

## 2014-07-30 MED ORDER — DEXAMETHASONE SODIUM PHOSPHATE 10 MG/ML IJ SOLN
10.0000 mg | Freq: Once | INTRAMUSCULAR | Status: AC
  Administered 2014-07-30: 10 mg via INTRAVENOUS

## 2014-07-30 MED ORDER — HEPARIN SOD (PORK) LOCK FLUSH 100 UNIT/ML IV SOLN
500.0000 [IU] | Freq: Once | INTRAVENOUS | Status: AC | PRN
  Administered 2014-07-30: 500 [IU]
  Filled 2014-07-30: qty 5

## 2014-07-30 MED ORDER — DEXAMETHASONE SODIUM PHOSPHATE 10 MG/ML IJ SOLN
INTRAMUSCULAR | Status: AC
  Filled 2014-07-30: qty 1

## 2014-07-30 MED ORDER — FAMOTIDINE IN NACL 20-0.9 MG/50ML-% IV SOLN
20.0000 mg | Freq: Once | INTRAVENOUS | Status: AC
  Administered 2014-07-30: 20 mg via INTRAVENOUS

## 2014-07-30 MED ORDER — PACLITAXEL CHEMO INJECTION 300 MG/50ML
80.0000 mg/m2 | Freq: Once | INTRAVENOUS | Status: AC
  Administered 2014-07-30: 126 mg via INTRAVENOUS
  Filled 2014-07-30: qty 21

## 2014-07-30 MED ORDER — FAMOTIDINE IN NACL 20-0.9 MG/50ML-% IV SOLN
INTRAVENOUS | Status: AC
  Filled 2014-07-30: qty 50

## 2014-07-30 MED ORDER — DIPHENHYDRAMINE HCL 50 MG/ML IJ SOLN
INTRAMUSCULAR | Status: AC
  Filled 2014-07-30: qty 1

## 2014-07-30 MED ORDER — DIPHENHYDRAMINE HCL 50 MG/ML IJ SOLN
50.0000 mg | Freq: Once | INTRAMUSCULAR | Status: AC
  Administered 2014-07-30: 50 mg via INTRAVENOUS

## 2014-07-30 MED ORDER — ONDANSETRON 8 MG/NS 50 ML IVPB
INTRAVENOUS | Status: AC
  Filled 2014-07-30: qty 8

## 2014-07-30 MED ORDER — ONDANSETRON 8 MG/50ML IVPB (CHCC)
8.0000 mg | Freq: Once | INTRAVENOUS | Status: AC
  Administered 2014-07-30: 8 mg via INTRAVENOUS

## 2014-07-30 MED ORDER — SODIUM CHLORIDE 0.9 % IV SOLN
Freq: Once | INTRAVENOUS | Status: AC
  Administered 2014-07-30: 09:00:00 via INTRAVENOUS

## 2014-07-30 NOTE — Assessment & Plan Note (Signed)
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 Severe headaches: Improved compared to cycle one but she still had headaches with cycle 2 that lasted for 4-5 days. She has taken Tylenol and Benadryl for headaches. 2. Severe back and chest wall pain related to Neulasta: Patient has not required hydrocodone for the back pain. She'll take Tylenol along with Claritin-D and that seems to be helping. 3. Alopecia 4. Chemo induced nausea/ vomiting and dehydration: after cycle 4 of AC. Given IV fluids. Monitoring closely for chemotoxicities.  Return to clinic in 1 week for follow-up on Taxol.

## 2014-07-30 NOTE — Patient Instructions (Signed)
[facility] Discharge Instructions for Patients Receiving Chemotherapy  Today you received the following chemotherapy agents: Taxol.  To help prevent nausea and vomiting after your treatment, we encourage you to take your nausea medication as prescribed.   If you develop nausea and vomiting that is not controlled by your nausea medication, call the clinic.   BELOW ARE SYMPTOMS THAT SHOULD BE REPORTED IMMEDIATELY:  *FEVER GREATER THAN 100.5 F  *CHILLS WITH OR WITHOUT FEVER  NAUSEA AND VOMITING THAT IS NOT CONTROLLED WITH YOUR NAUSEA MEDICATION  *UNUSUAL SHORTNESS OF BREATH  *UNUSUAL BRUISING OR BLEEDING  TENDERNESS IN MOUTH AND THROAT WITH OR WITHOUT PRESENCE OF ULCERS  *URINARY PROBLEMS  *BOWEL PROBLEMS  UNUSUAL RASH Items with * indicate a potential emergency and should be followed up as soon as possible.  Feel free to call the clinic you have any questions or concerns. The clinic phone number is [phone].    

## 2014-07-30 NOTE — Progress Notes (Signed)
Patient Care Team: Tivis Ringer, MD as PCP - General (Internal Medicine) [name], MD as Consulting Physician (General Surgery) Rulon Eisenmenger, MD as Consulting Physician (Hematology and Oncology) [name], MD as Consulting Physician (Radiation Oncology)  DIAGNOSIS: Breast cancer of upper-outer quadrant of right female breast   Staging form: Breast, AJCC 7th Edition     Clinical: Stage IA (T1b, N0, cM0) - Signed by Rulon Eisenmenger, MD on 03/20/2014     Pathologic: Stage IIB (T2, N1, cM0) - Unsigned   SUMMARY OF ONCOLOGIC HISTORY:   Breast cancer of upper-outer quadrant of right female breast   03/12/2014 Initial Biopsy Invasive ductal carcinoma with DCIS, right axillary lymph node biopsy negative, ER percent, PR 84%, Ki-67 16%, HER-2 negative   03/20/2014 Breast MRI Right breast upper outer quadrant 2.6 x 1.9 x 1.7 cm invasive ductal carcinoma with extension and involvement of the adjacent skin and nipple area complex, no abnormal lymph nodes   04/20/2014 Surgery Right breast lumpectomy: IDC grade 2; 2.5 cm with intermediate to high-grade DCIS margins negative: 1/3 positive sentinel lymph node with extracapsular extension, ER 100%, PR 84%, HER-2 negative ratio 1.22, Ki-67 16% T2, N1, M0 stage II B   06/04/2014 -  Chemotherapy Adjuvant chemotherapy dose dense Adriamycin and Cytoxan 4 followed by Taxol 12    CHIEF COMPLIANT: Taxol week 1   INTERVAL HISTORY: Mckenzie Sanford is a 59 year old lady with above-mentioned history of right-sided breast cancer who is currently on adjuvant chemotherapy. Today is week 1 of Taxol. After cycle 4 of Adriamycin and Cytoxan, she had profound nausea and vomiting that required IV fluids and Zofran IV. She is feeling a lot better today and does not have much trouble with it. She has profound lack of taste. But she is trying to eat and drink more liquids. Her fingers are slightly sensitive but denies any neuropathy symptoms.  REVIEW OF  SYSTEMS:   Constitutional: Denies fevers, chills or abnormal weight loss Eyes: Denies blurriness of vision Ears, nose, mouth, throat, and face: Denies mucositis or sore throat Respiratory: Denies cough, dyspnea or wheezes Cardiovascular: Denies palpitation, chest discomfort or lower extremity swelling Gastrointestinal:  Denies nausea, heartburn or change in bowel habits Skin: Denies abnormal skin rashes Lymphatics: Denies new lymphadenopathy or easy bruising Neurological:Denies numbness, tingling or new weaknesses Behavioral/Psych: Mood is stable, no new changes  All other systems were reviewed with the patient and are negative.  I have reviewed the past medical history, past surgical history, social history and family history with the patient and they are unchanged from previous note.  ALLERGIES:  is allergic to shellfish allergy; ivp dye; and penicillins.  MEDICATIONS:  Current Outpatient Prescriptions  Medication Sig Dispense Refill  . acetaminophen (TYLENOL) 325 MG tablet Take 325 mg by mouth 1 day or 1 dose.    Marland Kitchen amLODipine (NORVASC) 10 MG tablet Take 10 mg by mouth daily.    Marland Kitchen HYDROcodone-acetaminophen (NORCO/VICODIN) 5-325 MG per tablet Take 1-2 tablets by mouth every 4 (four) hours as needed for moderate pain or severe pain. 30 tablet 0  . ibuprofen (ADVIL,MOTRIN) 200 MG tablet Take 400 mg by mouth every 6 (six) hours as needed.    . irbesartan (AVAPRO) 150 MG tablet     . lidocaine-prilocaine (EMLA) cream   3  . LINZESS 290 MCG CAPS capsule Take 290 mcg by mouth daily as needed.  11  . loratadine (CLARITIN) 10 MG tablet Take 10 mg by mouth daily.    Marland Kitchen  LORazepam (ATIVAN) 0.5 MG tablet   0  . metoprolol succinate (TOPROL-XL) 50 MG 24 hr tablet Take 50 mg by mouth daily. Take with or immediately following a meal.    . prochlorperazine (COMPAZINE) 10 MG tablet   1  . sertraline (ZOLOFT) 25 MG tablet at bedtime.   11  . simvastatin (ZOCOR) 40 MG tablet Take 40 mg by mouth every  evening.     No current facility-administered medications for this visit.    PHYSICAL EXAMINATION: ECOG PERFORMANCE STATUS: 1 - Symptomatic but completely ambulatory  Filed Vitals:   07/30/14 0818  BP: 136/79  Pulse: 84  Temp: 97.6 F (36.4 C)  Resp: 18   Filed Weights   07/30/14 0818  Weight: 127 lb 14.4 oz (58.015 kg)    GENERAL:alert, no distress and comfortable SKIN: skin color, texture, turgor are normal, no rashes or significant lesions EYES: normal, Conjunctiva are pink and non-injected, sclera clear OROPHARYNX:no exudate, no erythema and lips, buccal mucosa, and tongue normal  NECK: supple, thyroid normal size, non-tender, without nodularity LYMPH:  no palpable lymphadenopathy in the cervical, axillary or inguinal LUNGS: clear to auscultation and percussion with normal breathing effort HEART: regular rate & rhythm and no murmurs and no lower extremity edema ABDOMEN:abdomen soft, non-tender and normal bowel sounds Musculoskeletal:no cyanosis of digits and no clubbing  NEURO: alert & oriented x 3 with fluent speech, no focal motor/sensory deficits  LABORATORY DATA:  I have reviewed the data as listed   Chemistry      Component Value Date/Time   NA 136 07/23/2014 1349   NA 139 05/28/2014 1330   K 3.9 07/23/2014 1349   K 4.3 05/28/2014 1330   CL 105 05/28/2014 1330   CO2 24 07/23/2014 1349   CO2 28 05/28/2014 1330   BUN 9.7 07/23/2014 1349   BUN 11 05/28/2014 1330   CREATININE 0.6 07/23/2014 1349   CREATININE 0.75 05/28/2014 1330   CREATININE 0.72 12/11/2011 1238      Component Value Date/Time   CALCIUM 9.9 07/23/2014 1349   CALCIUM 9.6 05/28/2014 1330   ALKPHOS 175* 07/23/2014 1349   ALKPHOS 134* 04/16/2014 1430   AST 13 07/23/2014 1349   AST 19 04/16/2014 1430   ALT 9 07/23/2014 1349   ALT 24 04/16/2014 1430   BILITOT 0.23 07/23/2014 1349   BILITOT <0.2* 04/16/2014 1430       Lab Results  Component Value Date   WBC 11.8* 07/30/2014   HGB  11.1* 07/30/2014   HCT 33.5* 07/30/2014   MCV 90.8 07/30/2014   PLT 253 07/30/2014   NEUTROABS 9.4* 07/30/2014    ASSESSMENT & PLAN:  Breast cancer of upper-outer quadrant of right female breast 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 Severe headaches: Improved compared to cycle one but she still had headaches with cycle 2 that lasted for 4-5 days. She has taken Tylenol and Benadryl for headaches. 2. Severe back and chest wall pain related to Neulasta: Patient has not required hydrocodone for the back pain. She'll take Tylenol along with Claritin-D and that seems to be helping. 3. Alopecia 4. Chemo induced nausea/ vomiting and dehydration: after cycle  4 of AC. Given IV fluids. Monitoring closely for chemotoxicities.  Return to clinic in 1 week for follow-up on Taxol.    No orders of the defined types were placed in this encounter.   The patient has a good understanding of the overall plan. she agrees with it. She will call with any problems that may develop before her next visit here.   Rulon Eisenmenger, MD

## 2014-07-30 NOTE — Telephone Encounter (Signed)
appts added,had to split appts due to no avail,pt will get a new avs in chemo today

## 2014-07-31 ENCOUNTER — Telehealth: Payer: Self-pay

## 2014-07-31 NOTE — Telephone Encounter (Signed)
S/w pt and she has no nausea, food does not taste good but she is able to eat. No constipation, no diarrhea. She is drinking water. Denied any questions at present.

## 2014-08-04 ENCOUNTER — Encounter: Payer: Self-pay | Admitting: Hematology and Oncology

## 2014-08-04 NOTE — Progress Notes (Signed)
Received disability forms/medical notes/labs.  back from dr. I will let patient know ready for pick up.

## 2014-08-04 NOTE — Progress Notes (Signed)
Placing disability forms on Dr. Geralyn Flash nurses desk-- [place]

## 2014-08-05 ENCOUNTER — Other Ambulatory Visit (HOSPITAL_BASED_OUTPATIENT_CLINIC_OR_DEPARTMENT_OTHER): Payer: 59

## 2014-08-05 ENCOUNTER — Ambulatory Visit (HOSPITAL_COMMUNITY)
Admission: RE | Admit: 2014-08-05 | Discharge: 2014-08-05 | Disposition: A | Discharge status: Home / Self Care | Payer: 59 | Source: Ambulatory Visit | Attending: Hematology and Oncology | Admitting: Hematology and Oncology

## 2014-08-05 ENCOUNTER — Other Ambulatory Visit: Payer: Self-pay

## 2014-08-05 ENCOUNTER — Ambulatory Visit (HOSPITAL_BASED_OUTPATIENT_CLINIC_OR_DEPARTMENT_OTHER): Payer: 59 | Admitting: Hematology and Oncology

## 2014-08-05 ENCOUNTER — Other Ambulatory Visit: Payer: Self-pay | Admitting: Hematology and Oncology

## 2014-08-05 VITALS — BP 136/78 | HR 89 | Temp 98.1°F | Resp 18 | Ht 62.0 in | Wt 127.3 lb

## 2014-08-05 DIAGNOSIS — C50919 Malignant neoplasm of unspecified site of unspecified female breast: Secondary | ICD-10-CM | POA: Diagnosis not present

## 2014-08-05 DIAGNOSIS — R0602 Shortness of breath: Secondary | ICD-10-CM | POA: Diagnosis not present

## 2014-08-05 DIAGNOSIS — C50411 Malignant neoplasm of upper-outer quadrant of right female breast: Secondary | ICD-10-CM

## 2014-08-05 DIAGNOSIS — R05 Cough: Secondary | ICD-10-CM | POA: Diagnosis not present

## 2014-08-05 DIAGNOSIS — Z170 Estrogen receptor positive status [ER+]: Secondary | ICD-10-CM

## 2014-08-05 DIAGNOSIS — R509 Fever, unspecified: Secondary | ICD-10-CM | POA: Diagnosis not present

## 2014-08-05 DIAGNOSIS — R51 Headache: Secondary | ICD-10-CM

## 2014-08-05 DIAGNOSIS — R502 Drug induced fever: Secondary | ICD-10-CM

## 2014-08-05 DIAGNOSIS — M549 Dorsalgia, unspecified: Secondary | ICD-10-CM

## 2014-08-05 DIAGNOSIS — M791 Myalgia: Secondary | ICD-10-CM

## 2014-08-05 DIAGNOSIS — R0789 Other chest pain: Secondary | ICD-10-CM

## 2014-08-05 LAB — CBC WITH DIFFERENTIAL/PLATELET
BASO%: 3.5 % — ABNORMAL HIGH (ref 0.0–2.0)
BASOS ABS: 0.1 10*3/uL (ref 0.0–0.1)
EOS%: 0.4 % (ref 0.0–7.0)
Eosinophils Absolute: 0 10*3/uL (ref 0.0–0.5)
HCT: 32.5 % — ABNORMAL LOW (ref 34.8–46.6)
HGB: 10.6 g/dL — ABNORMAL LOW (ref 11.6–15.9)
LYMPH#: 0.3 10*3/uL — AB (ref 0.9–3.3)
LYMPH%: 9.3 % — ABNORMAL LOW (ref 14.0–49.7)
MCH: 29.8 pg (ref 25.1–34.0)
MCHC: 32.5 g/dL (ref 31.5–36.0)
MCV: 91.6 fL (ref 79.5–101.0)
MONO#: 0.6 10*3/uL (ref 0.1–0.9)
MONO%: 15.8 % — AB (ref 0.0–14.0)
NEUT#: 2.5 10*3/uL (ref 1.5–6.5)
NEUT%: 71 % (ref 38.4–76.8)
PLATELETS: 463 10*3/uL — AB (ref 145–400)
RBC: 3.55 10*6/uL — AB (ref 3.70–5.45)
RDW: 17 % — ABNORMAL HIGH (ref 11.2–14.5)
WBC: 3.6 10*3/uL — ABNORMAL LOW (ref 3.9–10.3)

## 2014-08-05 LAB — COMPREHENSIVE METABOLIC PANEL (CC13)
ALK PHOS: 110 U/L (ref 40–150)
ALT: 36 U/L (ref 0–55)
ANION GAP: 10 meq/L (ref 3–11)
AST: 30 U/L (ref 5–34)
Albumin: 3.7 g/dL (ref 3.5–5.0)
BUN: 6.4 mg/dL — ABNORMAL LOW (ref 7.0–26.0)
CHLORIDE: 106 meq/L (ref 98–109)
CO2: 24 meq/L (ref 22–29)
Calcium: 9.7 mg/dL (ref 8.4–10.4)
Creatinine: 0.7 mg/dL (ref 0.6–1.1)
EGFR: >90 mL/min/{1.73_m2} (ref >90)
GLUCOSE: 141 mg/dL — AB (ref 70–140)
Potassium: 3.6 mEq/L (ref 3.5–5.1)
Sodium: 140 mEq/L (ref 136–145)
TOTAL PROTEIN: 6.9 g/dL (ref 6.4–8.3)
Total Bilirubin: 0.22 mg/dL (ref 0.20–1.20)

## 2014-08-05 LAB — INFLUENZA A AND B
Inflenza A Ag: NEGATIVE
Influenza B Ag: NEGATIVE

## 2014-08-05 MED ORDER — LEVOFLOXACIN 750 MG PO TABS
750.0000 mg | ORAL_TABLET | Freq: Every day | ORAL | Status: DC
Start: 2014-08-05 — End: 2014-08-27

## 2014-08-05 NOTE — Assessment & Plan Note (Signed)
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 Severe headaches: Improved compared to cycle one but she still had headaches with cycle 2 that lasted for 4-5 days. She has taken Tylenol and Benadryl for headaches. 2. Severe back and chest wall pain related to Neulasta: Patient has not required hydrocodone for the back pain. She'll take Tylenol along with Claritin-D and that seems to be helping. 3. Alopecia 4. Chemo induced nausea/ vomiting and dehydration: after cycle 4 of AC. Given IV fluids. Monitoring closely for chemotoxicities.  Return to clinic in 2 weeks for follow-up on Taxol.

## 2014-08-05 NOTE — Progress Notes (Signed)
Let pt know negative results on flu test, possible pneumonia on CXR.  Levaquin called in to [facility].  Pt voiced understanding.

## 2014-08-05 NOTE — Progress Notes (Signed)
Patient Care Team: [name], MD as PCP - General (Internal Medicine) [name], MD as Consulting Physician (General Surgery) [name], MD as Consulting Physician (Hematology and Oncology) [name], MD as Consulting Physician (Radiation Oncology)  DIAGNOSIS: Breast cancer of upper-outer quadrant of right female breast   Staging form: Breast, AJCC 7th Edition     Clinical: Stage IA (T1b, N0, cM0) - Signed by Rulon Eisenmenger, MD on 03/20/2014     Pathologic: Stage IIB (T2, N1, cM0) - Unsigned   SUMMARY OF ONCOLOGIC HISTORY:   Breast cancer of upper-outer quadrant of right female breast   03/12/2014 Initial Biopsy Invasive ductal carcinoma with DCIS, right axillary lymph node biopsy negative, ER percent, PR 84%, Ki-67 16%, HER-2 negative   03/20/2014 Breast MRI Right breast upper outer quadrant 2.6 x 1.9 x 1.7 cm invasive ductal carcinoma with extension and involvement of the adjacent skin and nipple area complex, no abnormal lymph nodes   04/20/2014 Surgery Right breast lumpectomy: IDC grade 2; 2.5 cm with intermediate to high-grade DCIS margins negative: 1/3 positive sentinel lymph node with extracapsular extension, ER 100%, PR 84%, HER-2 negative ratio 1.22, Ki-67 16% T2, N1, M0 stage II B   06/04/2014 -  Chemotherapy Adjuvant chemotherapy dose dense Adriamycin and Cytoxan 4 followed by Taxol 12    CHIEF COMPLIANT: fevers chills myalgias  INTERVAL HISTORY: Mckenzie Sanford is a 59 year old with right breast cancer who is currently on adjuvant chemotherapy.she received week 1 of Taxol last week and had profound aches and pains and fevers and chills and right after the treatment. She was taking Tylenol and Advil along with intermittent hydrocodone. She did not have any nausea or vomiting. She continues to have cough with expectoration. She also has low-grade temperature.  REVIEW OF SYSTEMS:   Constitutional: Denies fevers, chills or abnormal weight loss Eyes: Denies  blurriness of vision Ears, nose, mouth, throat, and face: Denies mucositis or sore throat Respiratory: complains of cough expectoration fevers and myalgias Cardiovascular: Denies palpitation, chest discomfort or lower extremity swelling Gastrointestinal:  Denies nausea, heartburn or change in bowel habits Skin: Denies abnormal skin rashes Lymphatics: Denies new lymphadenopathy or easy bruising Neurological:Denies numbness, tingling or new weaknesses Behavioral/Psych: Mood is stable, no new changes   All other systems were reviewed with the patient and are negative.  I have reviewed the past medical history, past surgical history, social history and family history with the patient and they are unchanged from previous note.  ALLERGIES:  is allergic to shellfish allergy; ivp dye; and penicillins.  MEDICATIONS:  Current Outpatient Prescriptions  Medication Sig Dispense Refill  . acetaminophen (TYLENOL) 325 MG tablet Take 325 mg by mouth 1 day or 1 dose.    Marland Kitchen amLODipine (NORVASC) 10 MG tablet Take 10 mg by mouth daily.    Marland Kitchen HYDROcodone-acetaminophen (NORCO/VICODIN) 5-325 MG per tablet Take 1-2 tablets by mouth every 4 (four) hours as needed for moderate pain or severe pain. 30 tablet 0  . ibuprofen (ADVIL,MOTRIN) 200 MG tablet Take 400 mg by mouth every 6 (six) hours as needed.    . irbesartan (AVAPRO) 150 MG tablet     . lidocaine-prilocaine (EMLA) cream   3  . LINZESS 290 MCG CAPS capsule Take 290 mcg by mouth daily as needed.  11  . loratadine (CLARITIN) 10 MG tablet Take 10 mg by mouth daily.    Marland Kitchen LORazepam (ATIVAN) 0.5 MG tablet   0  . metoprolol succinate (TOPROL-XL) 50 MG 24 hr tablet  Take 50 mg by mouth daily. Take with or immediately following a meal.    . prochlorperazine (COMPAZINE) 10 MG tablet   1  . sertraline (ZOLOFT) 25 MG tablet at bedtime.   11  . simvastatin (ZOCOR) 40 MG tablet Take 40 mg by mouth every evening.     No current facility-administered medications for this  visit.    PHYSICAL EXAMINATION: ECOG PERFORMANCE STATUS: 1 - Symptomatic but completely ambulatory  There were no vitals filed for this visit. There were no vitals filed for this visit.  GENERAL:alert, no distress and comfortable SKIN: skin color, texture, turgor are normal, no rashes or significant lesions EYES: normal, Conjunctiva are pink and non-injected, sclera clear OROPHARYNX:no exudate, no erythema and lips, buccal mucosa, and tongue normal  NECK: supple, thyroid normal size, non-tender, without nodularity LYMPH:  no palpable lymphadenopathy in the cervical, axillary or inguinal LUNGS: clear to auscultation and percussion with normal breathing effort HEART: regular rate & rhythm and no murmurs and no lower extremity edema ABDOMEN:abdomen soft, non-tender and normal bowel sounds Musculoskeletal:no cyanosis of digits and no clubbing  NEURO: alert & oriented x 3 with fluent speech, no focal motor/sensory deficits  LABORATORY DATA:  I have reviewed the data as listed   Chemistry      Component Value Date/Time   NA 144 07/30/2014 0802   NA 139 05/28/2014 1330   K 3.4* 07/30/2014 0802   K 4.3 05/28/2014 1330   CL 105 05/28/2014 1330   CO2 27 07/30/2014 0802   CO2 28 05/28/2014 1330   BUN 7.7 07/30/2014 0802   BUN 11 05/28/2014 1330   CREATININE 0.7 07/30/2014 0802   CREATININE 0.75 05/28/2014 1330   CREATININE 0.72 12/11/2011 1238      Component Value Date/Time   CALCIUM 10.0 07/30/2014 0802   CALCIUM 9.6 05/28/2014 1330   ALKPHOS 159* 07/30/2014 0802   ALKPHOS 134* 04/16/2014 1430   AST 17 07/30/2014 0802   AST 19 04/16/2014 1430   ALT 18 07/30/2014 0802   ALT 24 04/16/2014 1430   BILITOT 0.21 07/30/2014 0802   BILITOT <0.2* 04/16/2014 1430       Lab Results  Component Value Date   WBC 3.6* 08/05/2014   HGB 10.6* 08/05/2014   HCT 32.5* 08/05/2014   MCV 91.6 08/05/2014   PLT 463* 08/05/2014   NEUTROABS 2.5 08/05/2014   ASSESSMENT & PLAN:  Breast  cancer of upper-outer quadrant of right female breast 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 Severe headaches: Improved compared to cycle one but she still had headaches with cycle 2 that lasted for 4-5 days. She has taken Tylenol and Benadryl for headaches. 2. Severe back and chest wall pain related to Neulasta: Patient has not required hydrocodone for the back pain. She'll take Tylenol along with Claritin-D and that seems to be helping. 3. Alopecia 4. Chemo induced nausea/ vomiting and dehydration: after cycle 4 of AC. Given IV fluids.  5. Fevers and chills and myalgias after week 1 of Taxol: She also had a cough expectoration and low-grade temperatures of 99. I would like her to get a chest x-ray and a throat swab for influenza. We will call her with the results of these tests and determine if she needs to go on any antibiotic.  Plan: Because of reaction to Taxol, I would like to switch her to Abraxane from next week. Monitoring closely for chemotoxicities.  Return to clinic in 2 weeks for follow-up on Taxol.  No orders of the defined types were placed in this encounter.   The patient has a good understanding of the overall plan. she agrees with it. She will call with any problems that may develop before her next visit here.   Rulon Eisenmenger, MD

## 2014-08-06 ENCOUNTER — Encounter: Payer: 59 | Admitting: Nutrition

## 2014-08-06 ENCOUNTER — Ambulatory Visit: Payer: 59

## 2014-08-06 ENCOUNTER — Other Ambulatory Visit: Payer: 59

## 2014-08-06 NOTE — Telephone Encounter (Signed)
No additional note

## 2014-08-13 ENCOUNTER — Telehealth: Payer: Self-pay | Admitting: Hematology and Oncology

## 2014-08-13 ENCOUNTER — Ambulatory Visit (HOSPITAL_BASED_OUTPATIENT_CLINIC_OR_DEPARTMENT_OTHER): Payer: 59

## 2014-08-13 ENCOUNTER — Other Ambulatory Visit: Payer: 59

## 2014-08-13 ENCOUNTER — Other Ambulatory Visit (HOSPITAL_BASED_OUTPATIENT_CLINIC_OR_DEPARTMENT_OTHER): Payer: 59

## 2014-08-13 ENCOUNTER — Ambulatory Visit (HOSPITAL_BASED_OUTPATIENT_CLINIC_OR_DEPARTMENT_OTHER): Payer: 59 | Admitting: Hematology and Oncology

## 2014-08-13 DIAGNOSIS — G622 Polyneuropathy due to other toxic agents: Secondary | ICD-10-CM

## 2014-08-13 DIAGNOSIS — C50411 Malignant neoplasm of upper-outer quadrant of right female breast: Secondary | ICD-10-CM

## 2014-08-13 DIAGNOSIS — R112 Nausea with vomiting, unspecified: Secondary | ICD-10-CM

## 2014-08-13 DIAGNOSIS — R51 Headache: Secondary | ICD-10-CM

## 2014-08-13 DIAGNOSIS — M454 Ankylosing spondylitis of thoracic region: Secondary | ICD-10-CM

## 2014-08-13 DIAGNOSIS — Z5111 Encounter for antineoplastic chemotherapy: Secondary | ICD-10-CM

## 2014-08-13 DIAGNOSIS — E860 Dehydration: Secondary | ICD-10-CM

## 2014-08-13 DIAGNOSIS — R0789 Other chest pain: Secondary | ICD-10-CM

## 2014-08-13 LAB — COMPREHENSIVE METABOLIC PANEL (CC13)
ALK PHOS: 129 U/L (ref 40–150)
ALT: 33 U/L (ref 0–55)
AST: 25 U/L (ref 5–34)
Albumin: 3.7 g/dL (ref 3.5–5.0)
Anion Gap: 10 mEq/L (ref 3–11)
BUN: 7.5 mg/dL (ref 7.0–26.0)
CHLORIDE: 104 meq/L (ref 98–109)
CO2: 27 meq/L (ref 22–29)
Calcium: 9.8 mg/dL (ref 8.4–10.4)
Creatinine: 0.7 mg/dL (ref 0.6–1.1)
EGFR: >90 mL/min/{1.73_m2} (ref >90)
Glucose: 88 mg/dl (ref 70–140)
Potassium: 3.4 mEq/L — ABNORMAL LOW (ref 3.5–5.1)
Sodium: 140 mEq/L (ref 136–145)
TOTAL PROTEIN: 7 g/dL (ref 6.4–8.3)
Total Bilirubin: 0.32 mg/dL (ref 0.20–1.20)

## 2014-08-13 LAB — CBC WITH DIFFERENTIAL/PLATELET
BASO%: 3.3 % — ABNORMAL HIGH (ref 0.0–2.0)
Basophils Absolute: 0.1 10*3/uL (ref 0.0–0.1)
EOS ABS: 0.3 10*3/uL (ref 0.0–0.5)
EOS%: 7.1 % — ABNORMAL HIGH (ref 0.0–7.0)
HCT: 34.2 % — ABNORMAL LOW (ref 34.8–46.6)
HGB: 11.1 g/dL — ABNORMAL LOW (ref 11.6–15.9)
LYMPH#: 0.6 10*3/uL — AB (ref 0.9–3.3)
LYMPH%: 14.4 % (ref 14.0–49.7)
MCH: 29.7 pg (ref 25.1–34.0)
MCHC: 32.5 g/dL (ref 31.5–36.0)
MCV: 91.4 fL (ref 79.5–101.0)
MONO#: 1.2 10*3/uL — ABNORMAL HIGH (ref 0.1–0.9)
MONO%: 30.2 % — AB (ref 0.0–14.0)
NEUT#: 1.8 10*3/uL (ref 1.5–6.5)
NEUT%: 45 % (ref 38.4–76.8)
Platelets: 332 10*3/uL (ref 145–400)
RBC: 3.74 10*6/uL (ref 3.70–5.45)
RDW: 16 % — ABNORMAL HIGH (ref 11.2–14.5)
WBC: 4 10*3/uL (ref 3.9–10.3)

## 2014-08-13 MED ORDER — HYDROCODONE-ACETAMINOPHEN 5-325 MG PO TABS: 1.0000 | ORAL_TABLET | ORAL | Status: DC | PRN

## 2014-08-13 MED ORDER — PACLITAXEL PROTEIN-BOUND CHEMO INJECTION 100 MG
80.0000 mg/m2 | Freq: Once | INTRAVENOUS | Status: AC
  Administered 2014-08-13: 125 mg via INTRAVENOUS
  Filled 2014-08-13: qty 25

## 2014-08-13 MED ORDER — SODIUM CHLORIDE 0.9 % IV SOLN
Freq: Once | INTRAVENOUS | Status: AC
  Administered 2014-08-13: 11:00:00 via INTRAVENOUS
  Filled 2014-08-13: qty 4

## 2014-08-13 MED ORDER — HEPARIN SOD (PORK) LOCK FLUSH 100 UNIT/ML IV SOLN
500.0000 [IU] | Freq: Once | INTRAVENOUS | Status: AC | PRN
  Administered 2014-08-13: 500 [IU]
  Filled 2014-08-13: qty 5

## 2014-08-13 MED ORDER — SODIUM CHLORIDE 0.9 % IJ SOLN
10.0000 mL | INTRAMUSCULAR | Status: DC | PRN
  Administered 2014-08-13: 10 mL
  Filled 2014-08-13: qty 10

## 2014-08-13 MED ORDER — SODIUM CHLORIDE 0.9 % IV SOLN
Freq: Once | INTRAVENOUS | Status: AC
  Administered 2014-08-13: 10:00:00 via INTRAVENOUS

## 2014-08-13 NOTE — Patient Instructions (Signed)

## 2014-08-13 NOTE — Assessment & Plan Note (Signed)
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 Severe headaches: Improved compared to cycle one but she still had headaches with cycle 2 that lasted for 4-5 days. She has taken Tylenol and Benadryl for headaches. 2. Severe back and chest wall pain related to Neulasta: Patient has not required hydrocodone for the back pain. She'll take Tylenol along with Claritin-D and that seems to be helping. 3. Alopecia 4. Chemo induced nausea/ vomiting and dehydration: after cycle 4 of AC. Given IV fluids. 5. pneumonia after week 1 of Taxol: She also had a cough expectoration and low-grade temperatures of 99. Treated with Levaquin  Plan: Because of reaction to Taxol, we switched her to Abraxane. Monitoring closely for chemotoxicities.  Return to clinic in 2 weeks for follow-up on Taxol.

## 2014-08-13 NOTE — Progress Notes (Signed)
Patient Care Team: [name], MD as PCP - General (Internal Medicine) [name], MD as Consulting Physician (General Surgery) [name], MD as Consulting Physician (Hematology and Oncology) [name], MD as Consulting Physician (Radiation Oncology)  DIAGNOSIS: Breast cancer of upper-outer quadrant of right female breast   Staging form: Breast, AJCC 7th Edition     Clinical: Stage IA (T1b, N0, cM0) - Signed by Rulon Eisenmenger, MD on 03/20/2014     Pathologic: Stage IIB (T2, N1, cM0) - Unsigned   SUMMARY OF ONCOLOGIC HISTORY:   Breast cancer of upper-outer quadrant of right female breast   03/12/2014 Initial Biopsy Invasive ductal carcinoma with DCIS, right axillary lymph node biopsy negative, ER percent, PR 84%, Ki-67 16%, HER-2 negative   03/20/2014 Breast MRI Right breast upper outer quadrant 2.6 x 1.9 x 1.7 cm invasive ductal carcinoma with extension and involvement of the adjacent skin and nipple area complex, no abnormal lymph nodes   04/20/2014 Surgery Right breast lumpectomy: IDC grade 2; 2.5 cm with intermediate to high-grade DCIS margins negative: 1/3 positive sentinel lymph node with extracapsular extension, ER 100%, PR 84%, HER-2 negative ratio 1.22, Ki-67 16% T2, N1, M0 stage II B   06/04/2014 -  Chemotherapy Adjuvant chemotherapy dose dense Adriamycin and Cytoxan 4 followed by Taxol 12    CHIEF COMPLIANT: Intermittent cough and expectoration, week 1 Abraxane  INTERVAL HISTORY: Mckenzie Sanford is a 59 year old lady with above-mentioned history of right-sided breast cancer currently on adjuvant chemotherapy. We had to hold chemotherapy because of pneumonia and was treated with Levaquin. She is here today to continue with chemotherapy. 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 Today is her first dosage of Abraxane. Her pneumonia is much improved. She does not have any fevers or chills. She does have a cough. But the cough itself is getting  better over time.  REVIEW OF SYSTEMS:   Constitutional: Denies fevers, chills or abnormal weight loss Eyes: Denies blurriness of vision Ears, nose, mouth, throat, and face: Denies mucositis or sore throat Respiratory: cough Cardiovascular: Denies palpitation, chest discomfort or lower extremity swelling Gastrointestinal:  Denies nausea, heartburn or change in bowel habits Skin: Denies abnormal skin rashes Lymphatics: Denies new lymphadenopathy or easy bruising Neurological:Denies numbness, tingling or new weaknesses Behavioral/Psych: Mood is stable, no new changes  Breast:  denies any pain or lumps or nodules in either breasts All other systems were reviewed with the patient and are negative.  I have reviewed the past medical history, past surgical history, social history and family history with the patient and they are unchanged from previous note.  ALLERGIES:  is allergic to shellfish allergy; ivp dye; and penicillins.  MEDICATIONS:  Current Outpatient Prescriptions  Medication Sig Dispense Refill  . acetaminophen (TYLENOL) 325 MG tablet Take 325 mg by mouth 1 day or 1 dose.    Marland Kitchen amLODipine (NORVASC) 10 MG tablet Take 10 mg by mouth daily.    Marland Kitchen HYDROcodone-acetaminophen (NORCO/VICODIN) 5-325 MG per tablet Take 1-2 tablets by mouth every 4 (four) hours as needed for moderate pain or severe pain. 30 tablet 0  . ibuprofen (ADVIL,MOTRIN) 200 MG tablet Take 400 mg by mouth every 6 (six) hours as needed.    . irbesartan (AVAPRO) 150 MG tablet     . levofloxacin (LEVAQUIN) 250 MG tablet   0  . levofloxacin (LEVAQUIN) 750 MG tablet Take 1 tablet (750 mg total) by mouth daily. 7 tablet 0  . lidocaine-prilocaine (EMLA) cream  3  . LINZESS 290 MCG CAPS capsule Take 290 mcg by mouth daily as needed.  11  . loratadine (CLARITIN) 10 MG tablet Take 10 mg by mouth daily.    Marland Kitchen LORazepam (ATIVAN) 0.5 MG tablet   0  . metoprolol succinate (TOPROL-XL) 50 MG 24 hr tablet Take 50 mg by mouth daily.  Take with or immediately following a meal.    . prochlorperazine (COMPAZINE) 10 MG tablet   1  . sertraline (ZOLOFT) 25 MG tablet at bedtime.   11  . simvastatin (ZOCOR) 40 MG tablet Take 40 mg by mouth every evening.     No current facility-administered medications for this visit.    PHYSICAL EXAMINATION: ECOG PERFORMANCE STATUS: 1 - Symptomatic but completely ambulatory  Filed Vitals:   08/13/14 0910  BP: 123/80  Pulse: 86  Temp: 98.3 F (36.8 C)  Resp: 18   Filed Weights   08/13/14 0910  Weight: 127 lb 14.4 oz (58.015 kg)    GENERAL:alert, no distress and comfortable SKIN: skin color, texture, turgor are normal, no rashes or significant lesions EYES: normal, Conjunctiva are pink and non-injected, sclera clear OROPHARYNX:no exudate, no erythema and lips, buccal mucosa, and tongue normal  NECK: supple, thyroid normal size, non-tender, without nodularity LYMPH:  no palpable lymphadenopathy in the cervical, axillary or inguinal LUNGS: clear to auscultation and percussion with normal breathing effort HEART: regular rate & rhythm and no murmurs and no lower extremity edema ABDOMEN:abdomen soft, non-tender and normal bowel sounds Musculoskeletal:no cyanosis of digits and no clubbing  NEURO: alert & oriented x 3 with fluent speech, no focal motor/sensory deficits   LABORATORY DATA:  I have reviewed the data as listed   Chemistry      Component Value Date/Time   NA 140 08/13/2014 0859   NA 139 05/28/2014 1330   K 3.4* 08/13/2014 0859   K 4.3 05/28/2014 1330   CL 105 05/28/2014 1330   CO2 27 08/13/2014 0859   CO2 28 05/28/2014 1330   BUN 7.5 08/13/2014 0859   BUN 11 05/28/2014 1330   CREATININE 0.7 08/13/2014 0859   CREATININE 0.75 05/28/2014 1330   CREATININE 0.72 12/11/2011 1238      Component Value Date/Time   CALCIUM 9.8 08/13/2014 0859   CALCIUM 9.6 05/28/2014 1330   ALKPHOS 129 08/13/2014 0859   ALKPHOS 134* 04/16/2014 1430   AST 25 08/13/2014 0859   AST 19  04/16/2014 1430   ALT 33 08/13/2014 0859   ALT 24 04/16/2014 1430   BILITOT 0.32 08/13/2014 0859   BILITOT <0.2* 04/16/2014 1430       Lab Results  Component Value Date   WBC 4.0 08/13/2014   HGB 11.1* 08/13/2014   HCT 34.2* 08/13/2014   MCV 91.4 08/13/2014   PLT 332 08/13/2014   NEUTROABS 1.8 08/13/2014    ASSESSMENT & PLAN:  Breast cancer of upper-outer quadrant of right female breast 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 sentinel lymph nodes with extracapsular extension, ER 100%, PR 84%, HER-2 negative, Ki-67 16%, T2a N1 M0 stage IIB  Current treatment:Adjuvant systemic chemotherapy with dose dense Adriamycin and Cytoxan x4 cycles followed by weekly Taxol x1 followed by Abraxane 11  Abraxane week 1/11  Chemotherapy toxicities: 1. Severe headaches: Improved compared to cycle one but she still had headaches with cycle 2 that lasted for 4-5 days. She has taken Tylenol and Benadryl for headaches. 2. Severe back  and chest wall pain related to Neulasta: Patient has not required hydrocodone for the back pain. She'll take Tylenol along with Claritin-D and that seems to be helping. 3. Alopecia 4. Chemo induced nausea/ vomiting and dehydration: after cycle 4 of AC. Given IV fluids. 5. pneumonia after week 1 of Taxol: She also had a cough expectoration and low-grade temperatures of 99. Treated with Levaquin  Plan: Because of reaction to Taxol, we switched her to Abraxane. Monitoring closely for chemotoxicities.  Return to clinic in 1 week for follow-up on Abraxane  No orders of the defined types were placed in this encounter.   The patient has a good understanding of the overall plan. she agrees with it. She will call with any problems that may develop before her next visit here.   Rulon Eisenmenger, MD

## 2014-08-13 NOTE — Telephone Encounter (Signed)
Patient appointments in place and patient has a schedule and request not to print an avs   anne

## 2014-08-17 ENCOUNTER — Telehealth: Payer: Self-pay | Admitting: *Deleted

## 2014-08-17 NOTE — Telephone Encounter (Signed)
Spoke with patient. States she did well with chemo. Denies N/V. Only slight constipation, but has meds for that. Is resting well. No questions or concerns

## 2014-08-17 NOTE — Telephone Encounter (Signed)
-----   Message from Graylon Gunning, RN sent at 08/13/2014  1:47 PM EDT ----- Regarding: chemo follow up call First time Abraxane. Dr [name].

## 2014-08-20 ENCOUNTER — Telehealth: Payer: Self-pay

## 2014-08-20 ENCOUNTER — Encounter: Payer: Self-pay | Admitting: Nurse Practitioner

## 2014-08-20 ENCOUNTER — Ambulatory Visit: Payer: 59

## 2014-08-20 ENCOUNTER — Ambulatory Visit (HOSPITAL_BASED_OUTPATIENT_CLINIC_OR_DEPARTMENT_OTHER): Payer: 59 | Admitting: Nurse Practitioner

## 2014-08-20 ENCOUNTER — Ambulatory Visit: Payer: 59 | Admitting: Nutrition

## 2014-08-20 ENCOUNTER — Other Ambulatory Visit: Payer: 59

## 2014-08-20 ENCOUNTER — Ambulatory Visit (HOSPITAL_BASED_OUTPATIENT_CLINIC_OR_DEPARTMENT_OTHER): Payer: 59

## 2014-08-20 ENCOUNTER — Other Ambulatory Visit (HOSPITAL_BASED_OUTPATIENT_CLINIC_OR_DEPARTMENT_OTHER): Payer: 59

## 2014-08-20 VITALS — BP 130/75 | HR 74 | Temp 98.0°F | Resp 18 | Ht 62.0 in | Wt 124.2 lb

## 2014-08-20 DIAGNOSIS — R5383 Other fatigue: Secondary | ICD-10-CM | POA: Diagnosis not present

## 2014-08-20 DIAGNOSIS — C50411 Malignant neoplasm of upper-outer quadrant of right female breast: Secondary | ICD-10-CM

## 2014-08-20 DIAGNOSIS — Z5111 Encounter for antineoplastic chemotherapy: Secondary | ICD-10-CM | POA: Diagnosis not present

## 2014-08-20 DIAGNOSIS — D6481 Anemia due to antineoplastic chemotherapy: Secondary | ICD-10-CM

## 2014-08-20 LAB — CBC WITH DIFFERENTIAL/PLATELET
BASO%: 4 % — ABNORMAL HIGH (ref 0.0–2.0)
Basophils Absolute: 0.2 10*3/uL — ABNORMAL HIGH (ref 0.0–0.1)
EOS%: 6.5 % (ref 0.0–7.0)
Eosinophils Absolute: 0.3 10*3/uL (ref 0.0–0.5)
HCT: 34.7 % — ABNORMAL LOW (ref 34.8–46.6)
HEMOGLOBIN: 11.3 g/dL — AB (ref 11.6–15.9)
LYMPH%: 18.2 % (ref 14.0–49.7)
MCH: 29.7 pg (ref 25.1–34.0)
MCHC: 32.6 g/dL (ref 31.5–36.0)
MCV: 91.1 fL (ref 79.5–101.0)
MONO#: 0.6 10*3/uL (ref 0.1–0.9)
MONO%: 14.5 % — AB (ref 0.0–14.0)
NEUT%: 56.8 % (ref 38.4–76.8)
NEUTROS ABS: 2.4 10*3/uL (ref 1.5–6.5)
Platelets: 389 10*3/uL (ref 145–400)
RBC: 3.81 10*6/uL (ref 3.70–5.45)
RDW: 16 % — ABNORMAL HIGH (ref 11.2–14.5)
WBC: 4.3 10*3/uL (ref 3.9–10.3)
lymph#: 0.8 10*3/uL — ABNORMAL LOW (ref 0.9–3.3)

## 2014-08-20 LAB — COMPREHENSIVE METABOLIC PANEL (CC13)
ALBUMIN: 3.8 g/dL (ref 3.5–5.0)
ALK PHOS: 132 U/L (ref 40–150)
ALT: 35 U/L (ref 0–55)
AST: 28 U/L (ref 5–34)
Anion Gap: 9 mEq/L (ref 3–11)
BILIRUBIN TOTAL: 0.35 mg/dL (ref 0.20–1.20)
BUN: 6.3 mg/dL — AB (ref 7.0–26.0)
CO2: 26 mEq/L (ref 22–29)
Calcium: 9.5 mg/dL (ref 8.4–10.4)
Chloride: 104 mEq/L (ref 98–109)
Creatinine: 0.7 mg/dL (ref 0.6–1.1)
Glucose: 98 mg/dl (ref 70–140)
Potassium: 3.8 mEq/L (ref 3.5–5.1)
Sodium: 139 mEq/L (ref 136–145)
Total Protein: 6.7 g/dL (ref 6.4–8.3)

## 2014-08-20 MED ORDER — SODIUM CHLORIDE 0.9 % IV SOLN
Freq: Once | INTRAVENOUS | Status: AC
  Administered 2014-08-20: 11:00:00 via INTRAVENOUS

## 2014-08-20 MED ORDER — SODIUM CHLORIDE 0.9 % IJ SOLN
10.0000 mL | INTRAMUSCULAR | Status: DC | PRN
  Administered 2014-08-20: 10 mL
  Filled 2014-08-20: qty 10

## 2014-08-20 MED ORDER — SODIUM CHLORIDE 0.9 % IV SOLN
Freq: Once | INTRAVENOUS | Status: AC
  Administered 2014-08-20: 11:00:00 via INTRAVENOUS
  Filled 2014-08-20: qty 4

## 2014-08-20 MED ORDER — PACLITAXEL PROTEIN-BOUND CHEMO INJECTION 100 MG
80.0000 mg/m2 | Freq: Once | INTRAVENOUS | Status: AC
  Administered 2014-08-20: 125 mg via INTRAVENOUS
  Filled 2014-08-20: qty 25

## 2014-08-20 MED ORDER — HEPARIN SOD (PORK) LOCK FLUSH 100 UNIT/ML IV SOLN
500.0000 [IU] | Freq: Once | INTRAVENOUS | Status: AC | PRN
  Administered 2014-08-20: 500 [IU]
  Filled 2014-08-20: qty 5

## 2014-08-20 NOTE — Progress Notes (Signed)
Patient was identified to be at risk for malnutrition on the MST secondary to poor appetite and weight loss.  59 year old female diagnosed with breast cancer.  She is a patient of Dr. Sonny Dandy.  Past medical history includes hypertension, chest pain, hypercholesterolemia, IBS, and depression.  Medications include Ativan, Compazine, Zoloft, and Zocor.  Labs include potassium 3.4 on March 3.  Height: 62 inches. Weight: 124.2 pounds March 24. Usual body weight: 130 pounds. BMI: 22.71.  Patient reports her appetite is fair. She endorses 6 pound weight loss. She complains of occasional nausea and vomiting.  Nutrition diagnosis: Unintended weight loss related to breast cancer and associated treatments as evidenced by 6 pound weight loss from usual body weight.  Intervention: Patient educated to increase calories and protein in small frequent meals and snacks. Provided fact sheet on increasing calories and protein. Encouraged patient to take medications as needed to control nausea. Questions were answered and teach back method used.  Monitoring, evaluation, goals: Patient will work to increase calories and protein to minimize further weight loss.  Next visit: Thursday, April 14, during chemotherapy  **Disclaimer: This note was dictated with voice recognition software. Similar sounding words can inadvertently be transcribed and this note may contain transcription errors which may not have been corrected upon publication of note.**

## 2014-08-20 NOTE — Progress Notes (Signed)
Patient Care Team: [name], MD as PCP - General (Internal Medicine) [name], MD as Consulting Physician (General Surgery) [name], MD as Consulting Physician (Hematology and Oncology) [name], MD as Consulting Physician (Radiation Oncology)  DIAGNOSIS: Breast cancer of upper-outer quadrant of right female breast   Staging form: Breast, AJCC 7th Edition     Clinical: Stage IA (T1b, N0, cM0) - Signed by Rulon Eisenmenger, MD on 03/20/2014     Pathologic: Stage IIB (T2, N1, cM0) - Unsigned   SUMMARY OF ONCOLOGIC HISTORY:   Breast cancer of upper-outer quadrant of right female breast   03/12/2014 Initial Biopsy Invasive ductal carcinoma with DCIS, right axillary lymph node biopsy negative, ER percent, PR 84%, Ki-67 16%, HER-2 negative   03/20/2014 Breast MRI Right breast upper outer quadrant 2.6 x 1.9 x 1.7 cm invasive ductal carcinoma with extension and involvement of the adjacent skin and nipple area complex, no abnormal lymph nodes   04/20/2014 Surgery Right breast lumpectomy: IDC grade 2; 2.5 cm with intermediate to high-grade DCIS margins negative: 1/3 positive sentinel lymph node with extracapsular extension, ER 100%, PR 84%, HER-2 negative ratio 1.22, Ki-67 16% T2, N1, M0 stage II B   06/04/2014 -  Chemotherapy Adjuvant chemotherapy dose dense Adriamycin and Cytoxan 4 followed by Taxol 12    CHIEF COMPLIANT: Week 2 Abraxane  INTERVAL HISTORY: Mckenzie Sanford is a 59 year old lady with above-mentioned history of right-sided breast cancer currently on adjuvant chemotherapy. She is here to start cycle 2 of Abraxane. She tolerated the first cycle well. Her only complaint, as with any chemotherapy, was fatigue. She denies fevers, chills ,nausea or vomiting. She takes miralax and stool softeners for her constipation. Her appetite is fair, despite not being able to taste anything. She denies mouth sores, rashes, or peripheral neuropathy symptoms. She is no longer  coughing, but still uses Delsym PRN for her congestion.   REVIEW OF SYSTEMS:   Constitutional: Denies fevers, chills or abnormal weight loss Eyes: Denies blurriness of vision Ears, nose, mouth, throat, and face: Denies mucositis or sore throat Respiratory: cough Cardiovascular: Denies palpitation, chest discomfort or lower extremity swelling Gastrointestinal:  Denies nausea, heartburn or change in bowel habits Skin: Denies abnormal skin rashes Lymphatics: Denies new lymphadenopathy or easy bruising Neurological:Denies numbness, tingling or new weaknesses Behavioral/Psych: Mood is stable, no new changes  Breast:  denies any pain or lumps or nodules in either breasts All other systems were reviewed with the patient and are negative.  I have reviewed the past medical history, past surgical history, social history and family history with the patient and they are unchanged from previous note.  ALLERGIES:  is allergic to shellfish allergy; ivp dye; and penicillins.  MEDICATIONS:  Current Outpatient Prescriptions  Medication Sig Dispense Refill  . acetaminophen (TYLENOL) 325 MG tablet Take 325 mg by mouth 1 day or 1 dose.    Marland Kitchen amLODipine (NORVASC) 10 MG tablet Take 10 mg by mouth daily.    Marland Kitchen HYDROcodone-acetaminophen (NORCO/VICODIN) 5-325 MG per tablet Take 1-2 tablets by mouth every 4 (four) hours as needed for moderate pain or severe pain. 30 tablet 0  . ibuprofen (ADVIL,MOTRIN) 200 MG tablet Take 400 mg by mouth every 6 (six) hours as needed.    . irbesartan (AVAPRO) 150 MG tablet     . levofloxacin (LEVAQUIN) 250 MG tablet   0  . levofloxacin (LEVAQUIN) 750 MG tablet Take 1 tablet (750 mg total) by mouth daily. 7 tablet 0  .  lidocaine-prilocaine (EMLA) cream   3  . LINZESS 290 MCG CAPS capsule Take 290 mcg by mouth daily as needed.  11  . loratadine (CLARITIN) 10 MG tablet Take 10 mg by mouth daily.    . LORazepam (ATIVAN) 0.5 MG tablet   0  . metoprolol succinate (TOPROL-XL) 50 MG 24  hr tablet Take 50 mg by mouth daily. Take with or immediately following a meal.    . prochlorperazine (COMPAZINE) 10 MG tablet   1  . sertraline (ZOLOFT) 25 MG tablet at bedtime.   11  . simvastatin (ZOCOR) 40 MG tablet Take 40 mg by mouth every evening.     No current facility-administered medications for this visit.    PHYSICAL EXAMINATION: ECOG PERFORMANCE STATUS: 1 - Symptomatic but completely ambulatory  Filed Vitals:   08/20/14 0932  BP: 130/75  Pulse: 74  Temp: 98 F (36.7 C)  Resp: 18   Filed Weights   08/20/14 0932  Weight: 124 lb 3.2 oz (56.337 kg)   Skin: warm, dry  HEENT: sclerae anicteric, conjunctivae pink, oropharynx clear. No thrush or mucositis.  Lymph Nodes: No cervical or supraclavicular lymphadenopathy  Lungs: clear to auscultation bilaterally, no rales, wheezes, or rhonci  Heart: regular rate and rhythm  Abdomen: round, soft, non tender, positive bowel sounds  Musculoskeletal: No focal spinal tenderness, no peripheral edema  Neuro: non focal, well oriented, positive affect  Breasts: deferred   LABORATORY DATA:  I have reviewed the data as listed   Chemistry      Component Value Date/Time   NA 139 08/20/2014 0912   NA 139 05/28/2014 1330   K 3.8 08/20/2014 0912   K 4.3 05/28/2014 1330   CL 105 05/28/2014 1330   CO2 26 08/20/2014 0912   CO2 28 05/28/2014 1330   BUN 6.3* 08/20/2014 0912   BUN 11 05/28/2014 1330   CREATININE 0.7 08/20/2014 0912   CREATININE 0.75 05/28/2014 1330   CREATININE 0.72 12/11/2011 1238      Component Value Date/Time   CALCIUM 9.5 08/20/2014 0912   CALCIUM 9.6 05/28/2014 1330   ALKPHOS 132 08/20/2014 0912   ALKPHOS 134* 04/16/2014 1430   AST 28 08/20/2014 0912   AST 19 04/16/2014 1430   ALT 35 08/20/2014 0912   ALT 24 04/16/2014 1430   BILITOT 0.35 08/20/2014 0912   BILITOT <0.2* 04/16/2014 1430       Lab Results  Component Value Date   WBC 4.3 08/20/2014   HGB 11.3* 08/20/2014   HCT 34.7* 08/20/2014    MCV 91.1 08/20/2014   PLT 389 08/20/2014   NEUTROABS 2.4 08/20/2014    ASSESSMENT & PLAN:  Breast cancer of upper-outer quadrant of right female breast 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 sentinel lymph nodes with extracapsular extension, ER 100%, PR 84%, HER-2 negative, Ki-67 16%, T2a N1 M0 stage IIB  Current treatment:Adjuvant systemic chemotherapy with dose dense Adriamycin and Cytoxan x4 cycles followed by weekly Taxol x1 followed by Abraxane 11  Abraxane week 2/11  Farrie looks and feels well today. The labs were reviewed in detail and were entirely stable. She has some mild, but improving, treatment related anemia with an hgb of 11.3. We will continue to monitor this value. She will proceed with cycle 2 of abraxane today as planned   Andalyn will return next week for lab, a follow up visit, and cycle 3 of treatment.  No orders   of the defined types were placed in this encounter.   The patient has a good understanding of the overall plan. she agrees with it. She will call with any problems that may develop before her next visit here.   Heather F Boelter, NP   

## 2014-08-20 NOTE — Telephone Encounter (Signed)
Office note dtd 08/14/14 [facility] rcvd.  Reviewed by Dr. [name], Sent to scan.

## 2014-08-20 NOTE — Patient Instructions (Signed)
[facility] Discharge Instructions for Patients Receiving Chemotherapy  Today you received the following chemotherapy agents Abraxane To help prevent nausea and vomiting after your treatment, we encourage you to take your nausea medication as prescribed.   If you develop nausea and vomiting that is not controlled by your nausea medication, call the clinic.   BELOW ARE SYMPTOMS THAT SHOULD BE REPORTED IMMEDIATELY:  *FEVER GREATER THAN 100.5 F  *CHILLS WITH OR WITHOUT FEVER  NAUSEA AND VOMITING THAT IS NOT CONTROLLED WITH YOUR NAUSEA MEDICATION  *UNUSUAL SHORTNESS OF BREATH  *UNUSUAL BRUISING OR BLEEDING  TENDERNESS IN MOUTH AND THROAT WITH OR WITHOUT PRESENCE OF ULCERS  *URINARY PROBLEMS  *BOWEL PROBLEMS  UNUSUAL RASH Items with * indicate a potential emergency and should be followed up as soon as possible.  Feel free to call the clinic you have any questions or concerns. The clinic phone number is [phone].  Please show the CHEMO ALERT CARD at check-in to the Emergency Department and triage nurse.   

## 2014-08-24 ENCOUNTER — Encounter: Payer: Self-pay | Admitting: Hematology and Oncology

## 2014-08-24 NOTE — Progress Notes (Signed)
Enrolled pt in the Neulasta First Step program.  Faxed signed form and activated card today.  °

## 2014-08-27 ENCOUNTER — Ambulatory Visit (HOSPITAL_BASED_OUTPATIENT_CLINIC_OR_DEPARTMENT_OTHER): Payer: 59

## 2014-08-27 ENCOUNTER — Encounter: Payer: Self-pay | Admitting: Nurse Practitioner

## 2014-08-27 ENCOUNTER — Other Ambulatory Visit: Payer: 59

## 2014-08-27 ENCOUNTER — Other Ambulatory Visit (HOSPITAL_BASED_OUTPATIENT_CLINIC_OR_DEPARTMENT_OTHER): Payer: 59

## 2014-08-27 ENCOUNTER — Encounter: Payer: Self-pay | Admitting: *Deleted

## 2014-08-27 ENCOUNTER — Ambulatory Visit (HOSPITAL_BASED_OUTPATIENT_CLINIC_OR_DEPARTMENT_OTHER): Payer: 59 | Admitting: Nurse Practitioner

## 2014-08-27 VITALS — BP 130/76 | HR 72 | Temp 98.7°F | Resp 18 | Ht 62.0 in | Wt 124.5 lb

## 2014-08-27 DIAGNOSIS — C773 Secondary and unspecified malignant neoplasm of axilla and upper limb lymph nodes: Secondary | ICD-10-CM | POA: Diagnosis not present

## 2014-08-27 DIAGNOSIS — C50411 Malignant neoplasm of upper-outer quadrant of right female breast: Secondary | ICD-10-CM

## 2014-08-27 DIAGNOSIS — R5383 Other fatigue: Secondary | ICD-10-CM | POA: Diagnosis not present

## 2014-08-27 DIAGNOSIS — Z5111 Encounter for antineoplastic chemotherapy: Secondary | ICD-10-CM | POA: Diagnosis not present

## 2014-08-27 LAB — COMPREHENSIVE METABOLIC PANEL (CC13)
ALBUMIN: 3.7 g/dL (ref 3.5–5.0)
ALK PHOS: 140 U/L (ref 40–150)
ALT: 46 U/L (ref 0–55)
ANION GAP: 13 meq/L — AB (ref 3–11)
AST: 33 U/L (ref 5–34)
BILIRUBIN TOTAL: 0.4 mg/dL (ref 0.20–1.20)
BUN: 8.4 mg/dL (ref 7.0–26.0)
CALCIUM: 9.7 mg/dL (ref 8.4–10.4)
CHLORIDE: 106 meq/L (ref 98–109)
CO2: 23 mEq/L (ref 22–29)
Creatinine: 0.7 mg/dL (ref 0.6–1.1)
GLUCOSE: 99 mg/dL (ref 70–140)
POTASSIUM: 3.4 meq/L — AB (ref 3.5–5.1)
Sodium: 142 mEq/L (ref 136–145)
Total Protein: 6.8 g/dL (ref 6.4–8.3)

## 2014-08-27 LAB — CBC WITH DIFFERENTIAL/PLATELET
BASO%: 3.5 % — ABNORMAL HIGH (ref 0.0–2.0)
BASOS ABS: 0.1 10*3/uL (ref 0.0–0.1)
EOS%: 8.3 % — AB (ref 0.0–7.0)
Eosinophils Absolute: 0.3 10*3/uL (ref 0.0–0.5)
HEMATOCRIT: 36 % (ref 34.8–46.6)
HGB: 11.9 g/dL (ref 11.6–15.9)
LYMPH%: 23.3 % (ref 14.0–49.7)
MCH: 30.3 pg (ref 25.1–34.0)
MCHC: 33.1 g/dL (ref 31.5–36.0)
MCV: 91.6 fL (ref 79.5–101.0)
MONO#: 0.4 10*3/uL (ref 0.1–0.9)
MONO%: 12.4 % (ref 0.0–14.0)
NEUT%: 52.5 % (ref 38.4–76.8)
NEUTROS ABS: 1.8 10*3/uL (ref 1.5–6.5)
PLATELETS: 315 10*3/uL (ref 145–400)
RBC: 3.93 10*6/uL (ref 3.70–5.45)
RDW: 16.2 % — ABNORMAL HIGH (ref 11.2–14.5)
WBC: 3.4 10*3/uL — ABNORMAL LOW (ref 3.9–10.3)
lymph#: 0.8 10*3/uL — ABNORMAL LOW (ref 0.9–3.3)

## 2014-08-27 MED ORDER — HEPARIN SOD (PORK) LOCK FLUSH 100 UNIT/ML IV SOLN
500.0000 [IU] | Freq: Once | INTRAVENOUS | Status: AC | PRN
Start: 2014-08-27 — End: 2014-08-27
  Administered 2014-08-27: 500 [IU]
  Filled 2014-08-27: qty 5

## 2014-08-27 MED ORDER — SODIUM CHLORIDE 0.9 % IV SOLN
Freq: Once | INTRAVENOUS | Status: AC
  Administered 2014-08-27: 10:00:00 via INTRAVENOUS
  Filled 2014-08-27: qty 4

## 2014-08-27 MED ORDER — HYDROCODONE-ACETAMINOPHEN 5-325 MG PO TABS: 1.0000 | ORAL_TABLET | ORAL | Status: DC | PRN

## 2014-08-27 MED ORDER — SODIUM CHLORIDE 0.9 % IJ SOLN
10.0000 mL | INTRAMUSCULAR | Status: DC | PRN
  Administered 2014-08-27: 10 mL
  Filled 2014-08-27: qty 10

## 2014-08-27 MED ORDER — SODIUM CHLORIDE 0.9 % IV SOLN
Freq: Once | INTRAVENOUS | Status: AC
  Administered 2014-08-27: 10:00:00 via INTRAVENOUS

## 2014-08-27 MED ORDER — PACLITAXEL PROTEIN-BOUND CHEMO INJECTION 100 MG
80.0000 mg/m2 | Freq: Once | INTRAVENOUS | Status: AC
  Administered 2014-08-27: 125 mg via INTRAVENOUS
  Filled 2014-08-27: qty 25

## 2014-08-27 NOTE — Progress Notes (Signed)
Met with pt during Abraxane infusion. Relate she is doing well. Denies needs at this time. Encourage pt to call with further questions or concerns.

## 2014-08-27 NOTE — Patient Instructions (Signed)
[facility] Discharge Instructions for Patients Receiving Chemotherapy  Today you received the following chemotherapy agents Abraxane To help prevent nausea and vomiting after your treatment, we encourage you to take your nausea medication as prescribed.   If you develop nausea and vomiting that is not controlled by your nausea medication, call the clinic.   BELOW ARE SYMPTOMS THAT SHOULD BE REPORTED IMMEDIATELY:  *FEVER GREATER THAN 100.5 F  *CHILLS WITH OR WITHOUT FEVER  NAUSEA AND VOMITING THAT IS NOT CONTROLLED WITH YOUR NAUSEA MEDICATION  *UNUSUAL SHORTNESS OF BREATH  *UNUSUAL BRUISING OR BLEEDING  TENDERNESS IN MOUTH AND THROAT WITH OR WITHOUT PRESENCE OF ULCERS  *URINARY PROBLEMS  *BOWEL PROBLEMS  UNUSUAL RASH Items with * indicate a potential emergency and should be followed up as soon as possible.  Feel free to call the clinic you have any questions or concerns. The clinic phone number is [phone].  Please show the CHEMO ALERT CARD at check-in to the Emergency Department and triage nurse.   

## 2014-08-27 NOTE — Progress Notes (Signed)
Patient Care Team: [name], MD as PCP - General (Internal Medicine) [name], MD as Consulting Physician (General Surgery) [name], MD as Consulting Physician (Hematology and Oncology) [name], MD as Consulting Physician (Radiation Oncology)  DIAGNOSIS: Breast cancer of upper-outer quadrant of right female breast   Staging form: Breast, AJCC 7th Edition     Clinical: Stage IA (T1b, N0, cM0) - Signed by Rulon Eisenmenger, MD on 03/20/2014     Pathologic: Stage IIB (T2, N1, cM0) - Unsigned   SUMMARY OF ONCOLOGIC HISTORY:   Breast cancer of upper-outer quadrant of right female breast   03/12/2014 Initial Biopsy Invasive ductal carcinoma with DCIS, right axillary lymph node biopsy negative, ER percent, PR 84%, Ki-67 16%, HER-2 negative   03/20/2014 Breast MRI Right breast upper outer quadrant 2.6 x 1.9 x 1.7 cm invasive ductal carcinoma with extension and involvement of the adjacent skin and nipple area complex, no abnormal lymph nodes   04/20/2014 Surgery Right breast lumpectomy: IDC grade 2; 2.5 cm with intermediate to high-grade DCIS margins negative: 1/3 positive sentinel lymph node with extracapsular extension, ER 100%, PR 84%, HER-2 negative ratio 1.22, Ki-67 16% T2, N1, M0 stage II B   06/04/2014 -  Chemotherapy Adjuvant chemotherapy dose dense Adriamycin and Cytoxan 4 followed by Taxol 12    CHIEF COMPLIANT: Week 3 Abraxane  INTERVAL HISTORY: Mckenzie Sanford is a 59 year old lady with above-mentioned history of right-sided breast cancer currently on adjuvant chemotherapy. She is here to start cycle 3 of Abraxane..she continues to tolerate treatment well. As usual, her main complaint is fatigue. She denies fevers, chills, nausea, or vomiting. She used linzess this past week for her constipation. Her appetite is fair. She denies mouth sores or rashes. She had some numbness to her bilateral hands on Monday and Tuesday, but this resolved by Wednesday. Her nail beds  are starting to change colors.  REVIEW OF SYSTEMS:   Constitutional: Denies fevers, chills or abnormal weight loss Eyes: Denies blurriness of vision Ears, nose, mouth, throat, and face: Denies mucositis or sore throat Respiratory: cough Cardiovascular: Denies palpitation, chest discomfort or lower extremity swelling Gastrointestinal:  Denies nausea, heartburn or change in bowel habits Skin: Denies abnormal skin rashes Lymphatics: Denies new lymphadenopathy or easy bruising Neurological:Denies numbness, tingling or new weaknesses Behavioral/Psych: Mood is stable, no new changes  Breast:  denies any pain or lumps or nodules in either breasts All other systems were reviewed with the patient and are negative.  I have reviewed the past medical history, past surgical history, social history and family history with the patient and they are unchanged from previous note.  ALLERGIES:  is allergic to shellfish allergy; ivp dye; and penicillins.  MEDICATIONS:  Current Outpatient Prescriptions  Medication Sig Dispense Refill  . amLODipine (NORVASC) 10 MG tablet Take 10 mg by mouth daily.    Marland Kitchen HYDROcodone-acetaminophen (NORCO/VICODIN) 5-325 MG per tablet Take 1-2 tablets by mouth every 4 (four) hours as needed for moderate pain or severe pain. 30 tablet 0  . irbesartan (AVAPRO) 150 MG tablet     . lidocaine-prilocaine (EMLA) cream   3  . loratadine (CLARITIN) 10 MG tablet Take 10 mg by mouth daily.    . metoprolol succinate (TOPROL-XL) 50 MG 24 hr tablet Take 50 mg by mouth daily. Take with or immediately following a meal.    . sertraline (ZOLOFT) 25 MG tablet at bedtime.   11  . simvastatin (ZOCOR) 40 MG tablet Take 40 mg by mouth  every evening.    Marland Kitchen acetaminophen (TYLENOL) 325 MG tablet Take 325 mg by mouth 1 day or 1 dose.    . ibuprofen (ADVIL,MOTRIN) 200 MG tablet Take 400 mg by mouth every 6 (six) hours as needed.    Marland Kitchen LINZESS 290 MCG CAPS capsule Take 290 mcg by mouth daily as needed.  11   . LORazepam (ATIVAN) 0.5 MG tablet   0  . prochlorperazine (COMPAZINE) 10 MG tablet   1   No current facility-administered medications for this visit.    PHYSICAL EXAMINATION: ECOG PERFORMANCE STATUS: 1 - Symptomatic but completely ambulatory  Filed Vitals:   08/27/14 0837  BP: 130/76  Pulse: 72  Temp: 98.7 F (37.1 C)  Resp: 18   Filed Weights   08/27/14 0837  Weight: 124 lb 8 oz (56.473 kg)   Skin: warm, dry  HEENT: sclerae anicteric, conjunctivae pink, oropharynx clear. No thrush or mucositis.  Lymph Nodes: No cervical or supraclavicular lymphadenopathy  Lungs: clear to auscultation bilaterally, no rales, wheezes, or rhonci  Heart: regular rate and rhythm  Abdomen: round, soft, non tender, positive bowel sounds  Musculoskeletal: No focal spinal tenderness, no peripheral edema  Neuro: non focal, well oriented, positive affect  Breasts: deferred   LABORATORY DATA:  I have reviewed the data as listed   Chemistry      Component Value Date/Time   NA 142 08/27/2014 0810   NA 139 05/28/2014 1330   K 3.4* 08/27/2014 0810   K 4.3 05/28/2014 1330   CL 105 05/28/2014 1330   CO2 23 08/27/2014 0810   CO2 28 05/28/2014 1330   BUN 8.4 08/27/2014 0810   BUN 11 05/28/2014 1330   CREATININE 0.7 08/27/2014 0810   CREATININE 0.75 05/28/2014 1330   CREATININE 0.72 12/11/2011 1238      Component Value Date/Time   CALCIUM 9.7 08/27/2014 0810   CALCIUM 9.6 05/28/2014 1330   ALKPHOS 140 08/27/2014 0810   ALKPHOS 134* 04/16/2014 1430   AST 33 08/27/2014 0810   AST 19 04/16/2014 1430   ALT 46 08/27/2014 0810   ALT 24 04/16/2014 1430   BILITOT 0.40 08/27/2014 0810   BILITOT <0.2* 04/16/2014 1430       Lab Results  Component Value Date   WBC 3.4* 08/27/2014   HGB 11.9 08/27/2014   HCT 36.0 08/27/2014   MCV 91.6 08/27/2014   PLT 315 08/27/2014   NEUTROABS 1.8 08/27/2014    ASSESSMENT & PLAN:  Breast cancer of upper-outer quadrant of right female breast Right breast  invasive ductal carcinoma final pathology revealed tumor size 2.5 cm with DCIS one out of 3 positive sentinel lymph nodes with extracapsular extension, ER 100%, PR 84%, HER-2 negative, Ki-67 16%, T2a N1 M0 stage IIB  Current treatment:Adjuvant systemic chemotherapy with dose dense Adriamycin and Cytoxan x4 cycles followed by weekly Taxol x1 followed by Abraxane 11  Abraxane week 3/11  Mckenzie Sanford is doing well today. The labs were reviewed in detail and were entirely stable. She will proceed with cycle 3 of abraxane today as planned.   I gave her a prescription for her vicodin. It is not yet time to be filled, but she wanted a copy on hand in case she forgot to ask next week.  Foye will return next week for lab, a follow up visit, and cycle 4 of treatment.  No orders of the defined types were placed in this encounter.   The patient has a good understanding of the overall plan.  she agrees with it. She will call with any problems that may develop before her next visit here.   Laurie Panda, NP

## 2014-09-01 ENCOUNTER — Encounter: Payer: Self-pay | Admitting: General Surgery

## 2014-09-01 NOTE — Progress Notes (Signed)
Patient ID: Mckenzie Sanford, female   DOB: [birth year], 59 y.o.   MRN: [id number] Mckenzie Sanford 09/01/2014 9:47 AM Location: [facility] Patient #: [id number] DOB: 29-May-1956 Single / Language: Mckenzie Sanford / Race: Black or African American Female History of Present Illness Mckenzie Hollingshead MD; 09/01/2014 10:37 AM) Patient words: LTFU breast.  The patient is a 59 year old female    Note:She is s/p right partial mastectomy and sentinel lymph node biopsy for invasive right breast cancer April 20, 2014. Pathology demonstrated a T2 N1 lesion, ER/PR positive. She is being treated with chemotherapy via the Port-a-cath that was placed 06/01/14. She had some neuropathy and had her chemotherapy changed. No new breast masses.  Allergies Mckenzie Sanford, CMA; 09/01/2014 9:48 AM) Penicillamine *ASSORTED CLASSES*  Medication History Mckenzie Sanford, CMA; 09/01/2014 9:48 AM) Mckenzie Sanford (5-325MG  Tablet, 1-2 Oral every four hours, as needed, Taken starting 06/10/2014) Active. AmLODIPine Besylate (10MG  Tablet, Oral daily) Active. Metoprolol Succinate ER (50MG  Tablet ER 24HR, Oral daily) Active. Sertraline HCl (25MG  Tablet, Oral daily) Active. Simvastatin (40MG  Tablet, Oral daily) Active. Linzess (290MCG Capsule, Oral daily) Active. Irbesartan (150MG  Tablet, Oral daily) Active. Medications Reconciled    Vitals Mckenzie Sanford CMA; 09/01/2014 9:48 AM) 09/01/2014 9:48 AM Weight: 124 lb Height: 62in Body Surface Area: 1.57 m Body Mass Index: 22.68 kg/m Temp.: 97.51F(Temporal)  Pulse: 80 (Regular)  Resp.: 17 (Unlabored)  BP: 130/78 (Sitting, Left Arm, Standard)     Physical Exam Mckenzie Hollingshead MD; 09/01/2014 10:38 AM)  The physical exam findings are as follows: Note:Gen-WDWN in NAD.  Right breast-central scar, nipple absent, no masses.  Left breast-no masses or suspicious skin lesions.  Lymph nodes-no palpable cervical, supraclavicular or axillary  adenopathy.    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  Plan: RTC in 3 months.  Current Plans Free Text Instructions : discussed with patient and provided information. Follow up in 3 months or as needed  Mckenzie Mocha Valorie Mcgrory,MD

## 2014-09-03 ENCOUNTER — Ambulatory Visit: Payer: 59

## 2014-09-03 ENCOUNTER — Ambulatory Visit (HOSPITAL_BASED_OUTPATIENT_CLINIC_OR_DEPARTMENT_OTHER): Payer: 59

## 2014-09-03 ENCOUNTER — Ambulatory Visit (HOSPITAL_BASED_OUTPATIENT_CLINIC_OR_DEPARTMENT_OTHER): Payer: 59 | Admitting: Hematology and Oncology

## 2014-09-03 ENCOUNTER — Other Ambulatory Visit (HOSPITAL_BASED_OUTPATIENT_CLINIC_OR_DEPARTMENT_OTHER): Payer: 59

## 2014-09-03 VITALS — BP 128/79 | HR 66 | Temp 97.8°F | Resp 18 | Ht 62.0 in | Wt 124.1 lb

## 2014-09-03 DIAGNOSIS — C773 Secondary and unspecified malignant neoplasm of axilla and upper limb lymph nodes: Secondary | ICD-10-CM

## 2014-09-03 DIAGNOSIS — R079 Chest pain, unspecified: Secondary | ICD-10-CM

## 2014-09-03 DIAGNOSIS — M545 Low back pain: Secondary | ICD-10-CM

## 2014-09-03 DIAGNOSIS — C50411 Malignant neoplasm of upper-outer quadrant of right female breast: Secondary | ICD-10-CM

## 2014-09-03 DIAGNOSIS — R112 Nausea with vomiting, unspecified: Secondary | ICD-10-CM

## 2014-09-03 DIAGNOSIS — E860 Dehydration: Secondary | ICD-10-CM

## 2014-09-03 DIAGNOSIS — Z5111 Encounter for antineoplastic chemotherapy: Secondary | ICD-10-CM

## 2014-09-03 LAB — CBC WITH DIFFERENTIAL/PLATELET
BASO%: 2.3 % — AB (ref 0.0–2.0)
Basophils Absolute: 0.1 10*3/uL (ref 0.0–0.1)
EOS ABS: 0.1 10*3/uL (ref 0.0–0.5)
EOS%: 4.6 % (ref 0.0–7.0)
HEMATOCRIT: 36.2 % (ref 34.8–46.6)
HGB: 11.8 g/dL (ref 11.6–15.9)
LYMPH%: 27.2 % (ref 14.0–49.7)
MCH: 30.1 pg (ref 25.1–34.0)
MCHC: 32.6 g/dL (ref 31.5–36.0)
MCV: 92.3 fL (ref 79.5–101.0)
MONO#: 0.3 10*3/uL (ref 0.1–0.9)
MONO%: 9.5 % (ref 0.0–14.0)
NEUT%: 56.4 % (ref 38.4–76.8)
NEUTROS ABS: 1.7 10*3/uL (ref 1.5–6.5)
PLATELETS: 276 10*3/uL (ref 145–400)
RBC: 3.92 10*6/uL (ref 3.70–5.45)
RDW: 16.3 % — ABNORMAL HIGH (ref 11.2–14.5)
WBC: 3.1 10*3/uL — ABNORMAL LOW (ref 3.9–10.3)
lymph#: 0.8 10*3/uL — ABNORMAL LOW (ref 0.9–3.3)

## 2014-09-03 LAB — COMPREHENSIVE METABOLIC PANEL (CC13)
ALT: 42 U/L (ref 0–55)
ANION GAP: 12 meq/L — AB (ref 3–11)
AST: 29 U/L (ref 5–34)
Albumin: 4 g/dL (ref 3.5–5.0)
Alkaline Phosphatase: 154 U/L — ABNORMAL HIGH (ref 40–150)
BUN: 10.3 mg/dL (ref 7.0–26.0)
CO2: 23 meq/L (ref 22–29)
Calcium: 9.6 mg/dL (ref 8.4–10.4)
Chloride: 106 mEq/L (ref 98–109)
Creatinine: 0.7 mg/dL (ref 0.6–1.1)
GLUCOSE: 104 mg/dL (ref 70–140)
POTASSIUM: 3.5 meq/L (ref 3.5–5.1)
SODIUM: 141 meq/L (ref 136–145)
TOTAL PROTEIN: 7 g/dL (ref 6.4–8.3)
Total Bilirubin: 0.31 mg/dL (ref 0.20–1.20)

## 2014-09-03 MED ORDER — SODIUM CHLORIDE 0.9 % IV SOLN
Freq: Once | INTRAVENOUS | Status: AC
  Administered 2014-09-03: 11:00:00 via INTRAVENOUS
  Filled 2014-09-03: qty 4

## 2014-09-03 MED ORDER — HEPARIN SOD (PORK) LOCK FLUSH 100 UNIT/ML IV SOLN
500.0000 [IU] | Freq: Once | INTRAVENOUS | Status: AC | PRN
  Administered 2014-09-03: 500 [IU]
  Filled 2014-09-03: qty 5

## 2014-09-03 MED ORDER — PACLITAXEL PROTEIN-BOUND CHEMO INJECTION 100 MG
70.0000 mg/m2 | Freq: Once | INTRAVENOUS | Status: AC
  Administered 2014-09-03: 100 mg via INTRAVENOUS
  Filled 2014-09-03: qty 20

## 2014-09-03 MED ORDER — SODIUM CHLORIDE 0.9 % IJ SOLN
10.0000 mL | INTRAMUSCULAR | Status: DC | PRN
  Administered 2014-09-03: 10 mL
  Filled 2014-09-03: qty 10

## 2014-09-03 MED ORDER — SODIUM CHLORIDE 0.9 % IV SOLN
Freq: Once | INTRAVENOUS | Status: AC
  Administered 2014-09-03: 11:00:00 via INTRAVENOUS

## 2014-09-03 NOTE — Progress Notes (Signed)
Patient Care Team: [name], MD as PCP - General (Internal Medicine) [name], MD as Consulting Physician (General Surgery) [name], MD as Consulting Physician (Hematology and Oncology) [name], MD as Consulting Physician (Radiation Oncology)  DIAGNOSIS: Breast cancer of upper-outer quadrant of right female breast   Staging form: Breast, AJCC 7th Edition     Clinical: Stage IA (T1b, N0, cM0) - Signed by Rulon Eisenmenger, MD on 03/20/2014     Pathologic: Stage IIB (T2, N1, cM0) - Unsigned   SUMMARY OF ONCOLOGIC HISTORY:   Breast cancer of upper-outer quadrant of right female breast   03/12/2014 Initial Biopsy Invasive ductal carcinoma with DCIS, right axillary lymph node biopsy negative, ER percent, PR 84%, Ki-67 16%, HER-2 negative   03/20/2014 Breast MRI Right breast upper outer quadrant 2.6 x 1.9 x 1.7 cm invasive ductal carcinoma with extension and involvement of the adjacent skin and nipple area complex, no abnormal lymph nodes   04/20/2014 Surgery Right breast lumpectomy: IDC grade 2; 2.5 cm with intermediate to high-grade DCIS margins negative: 1/3 positive sentinel lymph node with extracapsular extension, ER 100%, PR 84%, HER-2 negative ratio 1.22, Ki-67 16% T2, N1, M0 stage II B   06/04/2014 -  Chemotherapy Adjuvant chemotherapy dose dense Adriamycin and Cytoxan 4 followed by Taxol 12    CHIEF COMPLIANT: week 5/12 Abraxane  INTERVAL HISTORY: Mckenzie Sanford is a 59 year old above-mentioned history of right-sided breast cancer and underwent lumpectomy and is on adjuvant chemotherapy. Today is week 5 of Abraxane. Overall she is tolerating it fairly well without any nausea vomiting or neuropathy. Nails are discolored and she is complaining of loss of eyebrows. Denies any loss of nails. She complains of strange and vivid dreams. She complains of fatigue.  REVIEW OF SYSTEMS:   Constitutional: Denies fevers, chills or abnormal weight loss Eyes: Denies  blurriness of vision Ears, nose, mouth, throat, and face: Denies mucositis or sore throat Respiratory: Denies cough, dyspnea or wheezes Cardiovascular: Denies palpitation, chest discomfort or lower extremity swelling Gastrointestinal:  Denies nausea, heartburn or change in bowel habits Skin: Denies abnormal skin rashes Lymphatics: Denies new lymphadenopathy or easy bruising Neurological:Denies numbness, tingling or new weaknesses Behavioral/Psych: Mood is stable, no new changes  Breast:  denies any pain or lumps or nodules in either breasts All other systems were reviewed with the patient and are negative.  I have reviewed the past medical history, past surgical history, social history and family history with the patient and they are unchanged from previous note.  ALLERGIES:  is allergic to shellfish allergy; ivp dye; and penicillins.  MEDICATIONS:  Current Outpatient Prescriptions  Medication Sig Dispense Refill  . acetaminophen (TYLENOL) 325 MG tablet Take 325 mg by mouth 1 day or 1 dose.    Marland Kitchen amLODipine (NORVASC) 10 MG tablet Take 10 mg by mouth daily.    Marland Kitchen HYDROcodone-acetaminophen (NORCO/VICODIN) 5-325 MG per tablet Take 1-2 tablets by mouth every 4 (four) hours as needed for moderate pain or severe pain. 30 tablet 0  . irbesartan (AVAPRO) 150 MG tablet     . lidocaine-prilocaine (EMLA) cream   3  . LINZESS 290 MCG CAPS capsule Take 290 mcg by mouth daily as needed.  11  . LORazepam (ATIVAN) 0.5 MG tablet   0  . metoprolol succinate (TOPROL-XL) 50 MG 24 hr tablet Take 50 mg by mouth daily. Take with or immediately following a meal.    . sertraline (ZOLOFT) 25 MG tablet at bedtime.   11  .  simvastatin (ZOCOR) 40 MG tablet Take 40 mg by mouth every evening.     No current facility-administered medications for this visit.    PHYSICAL EXAMINATION: ECOG PERFORMANCE STATUS: 1 - Symptomatic but completely ambulatory  Filed Vitals:   09/03/14 0923  BP: 128/79  Pulse: 66  Temp:  97.8 F (36.6 C)  Resp: 18   Filed Weights   09/03/14 0923  Weight: 124 lb 1.6 oz (56.291 kg)    GENERAL:alert, no distress and comfortable SKIN: skin color, texture, turgor are normal, no rashes or significant lesions EYES: normal, Conjunctiva are pink and non-injected, sclera clear OROPHARYNX:no exudate, no erythema and lips, buccal mucosa, and tongue normal  NECK: supple, thyroid normal size, non-tender, without nodularity LYMPH:  no palpable lymphadenopathy in the cervical, axillary or inguinal LUNGS: clear to auscultation and percussion with normal breathing effort HEART: regular rate & rhythm and no murmurs and no lower extremity edema ABDOMEN:abdomen soft, non-tender and normal bowel sounds Musculoskeletal:no cyanosis of digits and no clubbing  NEURO: alert & oriented x 3 with fluent speech, no focal motor/sensory deficits  LABORATORY DATA:  I have reviewed the data as listed   Chemistry      Component Value Date/Time   NA 141 09/03/2014 0914   NA 139 05/28/2014 1330   K 3.5 09/03/2014 0914   K 4.3 05/28/2014 1330   CL 105 05/28/2014 1330   CO2 23 09/03/2014 0914   CO2 28 05/28/2014 1330   BUN 10.3 09/03/2014 0914   BUN 11 05/28/2014 1330   CREATININE 0.7 09/03/2014 0914   CREATININE 0.75 05/28/2014 1330   CREATININE 0.72 12/11/2011 1238      Component Value Date/Time   CALCIUM 9.6 09/03/2014 0914   CALCIUM 9.6 05/28/2014 1330   ALKPHOS 154* 09/03/2014 0914   ALKPHOS 134* 04/16/2014 1430   AST 29 09/03/2014 0914   AST 19 04/16/2014 1430   ALT 42 09/03/2014 0914   ALT 24 04/16/2014 1430   BILITOT 0.31 09/03/2014 0914   BILITOT <0.2* 04/16/2014 1430       Lab Results  Component Value Date   WBC 3.1* 09/03/2014   HGB 11.8 09/03/2014   HCT 36.2 09/03/2014   MCV 92.3 09/03/2014   PLT 276 09/03/2014   NEUTROABS 1.7 09/03/2014    ASSESSMENT & PLAN:  Breast cancer of upper-outer quadrant of right female breast Right breast invasive ductal carcinoma  final pathology revealed tumor size 2.5 cm with DCIS one out of 3 positive sentinel lymph nodes with extracapsular extension, ER 100%, PR 84%, HER-2 negative, Ki-67 16%, T2a N1 M0 stage IIB  Current treatment:Adjuvant systemic chemotherapy with dose dense Adriamycin and Cytoxan x4 cycles followed by weekly Taxol x1 followed by Abraxane 11  Today is Abraxane week 5/12  Chemotherapy toxicities: 1. Severe headaches: Improved compared to cycle one but she still had headaches with cycle 2 that lasted for 4-5 days. She has taken Tylenol and Benadryl for headaches. 2. Severe back and chest wall pain related to Neulasta: Patient has not required hydrocodone for the back pain. She'll take Tylenol along with Claritin-D and that seems to be helping. 3. Alopecia 4. Chemo induced nausea/ vomiting and dehydration: after cycle 4 of AC. Given IV fluids. 5. Pneumonia after week 1 of Taxol: She also had a cough expectoration and low-grade temperatures of 99. Treated with Levaquin 6. Darkening of nails and loss of eyebrows 7. Vivid dreams  Because of slightly decreasing absolute neutrophil count as it is down to 1700,  I will decrease the dosage of Abraxane to 70 mg/m from today.  Monitoring closely for chemotoxicities. Return to clinic in 2 weeks for clinic follow-up in weekly for Abraxane treatments.  No orders of the defined types were placed in this encounter.   The patient has a good understanding of the overall plan. she agrees with it. She will call with any problems that may develop before her next visit here.   Rulon Eisenmenger, MD

## 2014-09-03 NOTE — Assessment & Plan Note (Signed)
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 sentinel lymph nodes with extracapsular extension, ER 100%, PR 84%, HER-2 negative, Ki-67 16%, T2a N1 M0 stage IIB  Current treatment:Adjuvant systemic chemotherapy with dose dense Adriamycin and Cytoxan x4 cycles followed by weekly Taxol x1 followed by Abraxane 11  Today is Abraxane week 5/12  Chemotherapy toxicities: 1. Severe headaches: Improved compared to cycle one but she still had headaches with cycle 2 that lasted for 4-5 days. She has taken Tylenol and Benadryl for headaches. 2. Severe back and chest wall pain related to Neulasta: Patient has not required hydrocodone for the back pain. She'll take Tylenol along with Claritin-D and that seems to be helping. 3. Alopecia 4. Chemo induced nausea/ vomiting and dehydration: after cycle 4 of AC. Given IV fluids. 5. Pneumonia after week 1 of Taxol: She also had a cough expectoration and low-grade temperatures of 99. Treated with Levaquin  Monitoring closely for chemotoxicities. Return to clinic in 2 weeks for clinic follow-up in weekly for Abraxane treatments.

## 2014-09-03 NOTE — Patient Instructions (Signed)
[facility] Discharge Instructions for Patients Receiving Chemotherapy  Today you received the following chemotherapy agents Abraxane To help prevent nausea and vomiting after your treatment, we encourage you to take your nausea medication as prescribed.   If you develop nausea and vomiting that is not controlled by your nausea medication, call the clinic.   BELOW ARE SYMPTOMS THAT SHOULD BE REPORTED IMMEDIATELY:  *FEVER GREATER THAN 100.5 F  *CHILLS WITH OR WITHOUT FEVER  NAUSEA AND VOMITING THAT IS NOT CONTROLLED WITH YOUR NAUSEA MEDICATION  *UNUSUAL SHORTNESS OF BREATH  *UNUSUAL BRUISING OR BLEEDING  TENDERNESS IN MOUTH AND THROAT WITH OR WITHOUT PRESENCE OF ULCERS  *URINARY PROBLEMS  *BOWEL PROBLEMS  UNUSUAL RASH Items with * indicate a potential emergency and should be followed up as soon as possible.  Feel free to call the clinic you have any questions or concerns. The clinic phone number is [phone].  Please show the CHEMO ALERT CARD at check-in to the Emergency Department and triage nurse.   

## 2014-09-10 ENCOUNTER — Encounter: Payer: Self-pay | Admitting: *Deleted

## 2014-09-10 ENCOUNTER — Ambulatory Visit (HOSPITAL_BASED_OUTPATIENT_CLINIC_OR_DEPARTMENT_OTHER): Payer: 59 | Admitting: Nurse Practitioner

## 2014-09-10 ENCOUNTER — Ambulatory Visit: Payer: 59 | Admitting: Nutrition

## 2014-09-10 ENCOUNTER — Encounter: Payer: Self-pay | Admitting: Nurse Practitioner

## 2014-09-10 ENCOUNTER — Other Ambulatory Visit (HOSPITAL_BASED_OUTPATIENT_CLINIC_OR_DEPARTMENT_OTHER): Payer: 59

## 2014-09-10 ENCOUNTER — Ambulatory Visit: Payer: 59

## 2014-09-10 ENCOUNTER — Other Ambulatory Visit (HOSPITAL_COMMUNITY)
Admission: RE | Admit: 2014-09-10 | Discharge: 2014-09-10 | Disposition: A | Discharge status: Home / Self Care | Payer: 59 | Source: Ambulatory Visit | Attending: Hematology and Oncology | Admitting: Hematology and Oncology

## 2014-09-10 ENCOUNTER — Ambulatory Visit (HOSPITAL_BASED_OUTPATIENT_CLINIC_OR_DEPARTMENT_OTHER): Payer: 59

## 2014-09-10 ENCOUNTER — Other Ambulatory Visit: Payer: 59

## 2014-09-10 VITALS — BP 126/79 | HR 68 | Temp 97.9°F | Resp 18 | Wt 123.2 lb

## 2014-09-10 DIAGNOSIS — C773 Secondary and unspecified malignant neoplasm of axilla and upper limb lymph nodes: Secondary | ICD-10-CM

## 2014-09-10 DIAGNOSIS — Z5111 Encounter for antineoplastic chemotherapy: Secondary | ICD-10-CM

## 2014-09-10 DIAGNOSIS — C50411 Malignant neoplasm of upper-outer quadrant of right female breast: Secondary | ICD-10-CM

## 2014-09-10 DIAGNOSIS — R112 Nausea with vomiting, unspecified: Secondary | ICD-10-CM | POA: Diagnosis not present

## 2014-09-10 DIAGNOSIS — C50419 Malignant neoplasm of upper-outer quadrant of unspecified female breast: Secondary | ICD-10-CM | POA: Diagnosis present

## 2014-09-10 DIAGNOSIS — M545 Low back pain: Secondary | ICD-10-CM | POA: Diagnosis not present

## 2014-09-10 DIAGNOSIS — R0789 Other chest pain: Secondary | ICD-10-CM

## 2014-09-10 LAB — COMPREHENSIVE METABOLIC PANEL
ALK PHOS: 142 U/L — AB (ref 39–117)
ALT: 41 U/L — AB (ref 0–35)
AST: 31 U/L (ref 0–37)
Albumin: 4.5 g/dL (ref 3.5–5.2)
Anion gap: 7 (ref 5–15)
BUN: 7 mg/dL (ref 6–23)
CHLORIDE: 105 mmol/L (ref 96–112)
CO2: 28 mmol/L (ref 19–32)
Calcium: 10.1 mg/dL (ref 8.4–10.5)
Creatinine, Ser: 0.65 mg/dL (ref 0.50–1.10)
GFR calc Af Amer: >90 mL/min (ref >90)
GLUCOSE: 98 mg/dL (ref 70–99)
POTASSIUM: 3.6 mmol/L (ref 3.5–5.1)
Sodium: 140 mmol/L (ref 135–145)
Total Bilirubin: 0.3 mg/dL (ref 0.3–1.2)
Total Protein: 7.4 g/dL (ref 6.0–8.3)

## 2014-09-10 LAB — CBC WITH DIFFERENTIAL/PLATELET
BASO%: 3.7 % — ABNORMAL HIGH (ref 0.0–2.0)
BASOS ABS: 0.1 10*3/uL (ref 0.0–0.1)
EOS ABS: 0.1 10*3/uL (ref 0.0–0.5)
EOS%: 3.4 % (ref 0.0–7.0)
HCT: 39.7 % (ref 34.8–46.6)
HEMOGLOBIN: 13 g/dL (ref 11.6–15.9)
LYMPH%: 28.2 % (ref 14.0–49.7)
MCH: 30.7 pg (ref 25.1–34.0)
MCHC: 32.7 g/dL (ref 31.5–36.0)
MCV: 93.6 fL (ref 79.5–101.0)
MONO#: 0.3 10*3/uL (ref 0.1–0.9)
MONO%: 11.6 % (ref 0.0–14.0)
NEUT%: 53.1 % (ref 38.4–76.8)
NEUTROS ABS: 1.6 10*3/uL (ref 1.5–6.5)
PLATELETS: 349 10*3/uL (ref 145–400)
RBC: 4.24 10*6/uL (ref 3.70–5.45)
RDW: 15.9 % — ABNORMAL HIGH (ref 11.2–14.5)
WBC: 2.9 10*3/uL — ABNORMAL LOW (ref 3.9–10.3)
lymph#: 0.8 10*3/uL — ABNORMAL LOW (ref 0.9–3.3)

## 2014-09-10 MED ORDER — SODIUM CHLORIDE 0.9 % IV SOLN
Freq: Once | INTRAVENOUS | Status: AC
  Administered 2014-09-10: 11:00:00 via INTRAVENOUS
  Filled 2014-09-10: qty 4

## 2014-09-10 MED ORDER — PACLITAXEL PROTEIN-BOUND CHEMO INJECTION 100 MG
70.0000 mg/m2 | Freq: Once | INTRAVENOUS | Status: AC
  Administered 2014-09-10: 100 mg via INTRAVENOUS
  Filled 2014-09-10: qty 20

## 2014-09-10 MED ORDER — HEPARIN SOD (PORK) LOCK FLUSH 100 UNIT/ML IV SOLN
500.0000 [IU] | Freq: Once | INTRAVENOUS | Status: AC | PRN
  Administered 2014-09-10: 500 [IU]
  Filled 2014-09-10: qty 5

## 2014-09-10 MED ORDER — SODIUM CHLORIDE 0.9 % IJ SOLN
10.0000 mL | INTRAMUSCULAR | Status: DC | PRN
Start: 2014-09-10 — End: 2014-09-10
  Administered 2014-09-10: 10 mL
  Filled 2014-09-10: qty 10

## 2014-09-10 MED ORDER — SODIUM CHLORIDE 0.9 % IV SOLN
Freq: Once | INTRAVENOUS | Status: AC
  Administered 2014-09-10: 10:00:00 via INTRAVENOUS

## 2014-09-10 NOTE — Progress Notes (Signed)
Nutrition follow-up completed with patient during infusion. Weight is relatively stable and documented as 123.2 pounds April 14. Patient reports appetite continues to be fair. Patient is drinking Carnation breakfast essentials and enjoys these very much. She mixes these with lactaid milk has no nutritional side effects. Patient has been choosing larger meals but has been told to snack more often.  Nutrition diagnosis: Unintended weight loss has stabilized.  Intervention: Patient educated to continue trying to increase calories and protein throughout the day. Patient should continue Carnation breakfast essentials as tolerated. Questions were answered.  Teach back method used.  Monitoring, evaluation, goals: Patient will work to increase calories and protein to minimize weight loss.  Next visit: To be scheduled as needed.  Patient has my contact information.  **Disclaimer: This note was dictated with voice recognition software. Similar sounding words can inadvertently be transcribed and this note may contain transcription errors which may not have been corrected upon publication of note.**

## 2014-09-10 NOTE — Patient Instructions (Addendum)
[facility] Discharge Instructions for Patients Receiving Chemotherapy  Today you received the following chemotherapy agents: Abraxane.  To help prevent nausea and vomiting after your treatment, we encourage you to take your nausea medication as directed  If you develop nausea and vomiting that is not controlled by your nausea medication, call the clinic.   BELOW ARE SYMPTOMS THAT SHOULD BE REPORTED IMMEDIATELY:  *FEVER GREATER THAN 100.5 F  *CHILLS WITH OR WITHOUT FEVER  NAUSEA AND VOMITING THAT IS NOT CONTROLLED WITH YOUR NAUSEA MEDICATION  *UNUSUAL SHORTNESS OF BREATH  *UNUSUAL BRUISING OR BLEEDING  TENDERNESS IN MOUTH AND THROAT WITH OR WITHOUT PRESENCE OF ULCERS  *URINARY PROBLEMS  *BOWEL PROBLEMS  UNUSUAL RASH Items with * indicate a potential emergency and should be followed up as soon as possible.  Feel free to call the clinic you have any questions or concerns. The clinic phone number is [phone].  

## 2014-09-10 NOTE — Progress Notes (Signed)
Met with pt prior to chemo treatment. Relate she is doing well, although lost another pound. Discussed ways to incorporate protein in her diet. Denies needs at this time. Encourage pt to call with questions or concerns.

## 2014-09-10 NOTE — Progress Notes (Signed)
Patient Care Team: [name], MD as PCP - General (Internal Medicine) [name], MD as Consulting Physician (General Surgery) [name], MD as Consulting Physician (Hematology and Oncology) [name], MD as Consulting Physician (Radiation Oncology)  DIAGNOSIS: Breast cancer of upper-outer quadrant of right female breast   Staging form: Breast, AJCC 7th Edition     Clinical: Stage IA (T1b, N0, cM0) - Signed by Rulon Eisenmenger, MD on 03/20/2014     Pathologic: Stage IIB (T2, N1, cM0) - Unsigned   SUMMARY OF ONCOLOGIC HISTORY:   Breast cancer of upper-outer quadrant of right female breast   03/12/2014 Initial Biopsy Invasive ductal carcinoma with DCIS, right axillary lymph node biopsy negative, ER percent, PR 84%, Ki-67 16%, HER-2 negative   03/20/2014 Breast MRI Right breast upper outer quadrant 2.6 x 1.9 x 1.7 cm invasive ductal carcinoma with extension and involvement of the adjacent skin and nipple area complex, no abnormal lymph nodes   04/20/2014 Surgery Right breast lumpectomy: IDC grade 2; 2.5 cm with intermediate to high-grade DCIS margins negative: 1/3 positive sentinel lymph node with extracapsular extension, ER 100%, PR 84%, HER-2 negative ratio 1.22, Ki-67 16% T2, N1, M0 stage II B   06/04/2014 -  Chemotherapy Adjuvant chemotherapy dose dense Adriamycin and Cytoxan 4 followed by Taxol 12    CHIEF COMPLIANT: week 6/12 Abraxane  INTERVAL HISTORY: Mckenzie Sanford is a 59 year old above-mentioned history of right-sided breast cancer and underwent lumpectomy and is on adjuvant chemotherapy. Today is week 6 of Abraxane. She continues to tolerate this well. As usual, her main complaint is fatigue as she is used to being fairly active. She denies fevers, chills, nausea, or vomiting.she is moving her bowels well. Again this week she had some numbness to her bilateral hands that resolves on her own within 24 hrs. She has lost another pound or two this week, to her dismay.    REVIEW OF SYSTEMS:   Constitutional: Denies fevers or chills, intermittent numbness to bilateral hands Eyes: Denies blurriness of vision Ears, nose, mouth, throat, and face: Denies mucositis or sore throat Respiratory: Denies cough, dyspnea or wheezes Cardiovascular: Denies palpitation, chest discomfort or lower extremity swelling Gastrointestinal:  Denies nausea, heartburn or change in bowel habits Skin: Denies abnormal skin rashes Lymphatics: Denies new lymphadenopathy or easy bruising Neurological:Denies new weaknesses, intermittent numbness to bilateral hands Behavioral/Psych: Mood is stable, no new changes  Breast:  denies any pain or lumps or nodules in either breasts All other systems were reviewed with the patient and are negative.  I have reviewed the past medical history, past surgical history, social history and family history with the patient and they are unchanged from previous note.  ALLERGIES:  is allergic to shellfish allergy; ivp dye; and penicillins.  MEDICATIONS:  Current Outpatient Prescriptions  Medication Sig Dispense Refill  . amLODipine (NORVASC) 10 MG tablet Take 10 mg by mouth daily.    Marland Kitchen HYDROcodone-acetaminophen (NORCO/VICODIN) 5-325 MG per tablet Take 1-2 tablets by mouth every 4 (four) hours as needed for moderate pain or severe pain. 30 tablet 0  . irbesartan (AVAPRO) 150 MG tablet     . lidocaine-prilocaine (EMLA) cream   3  . LORazepam (ATIVAN) 0.5 MG tablet   0  . metoprolol succinate (TOPROL-XL) 50 MG 24 hr tablet Take 50 mg by mouth daily. Take with or immediately following a meal.    . sertraline (ZOLOFT) 25 MG tablet at bedtime.   11  . simvastatin (ZOCOR) 40 MG tablet Take  40 mg by mouth every evening.    Marland Kitchen acetaminophen (TYLENOL) 325 MG tablet Take 325 mg by mouth 1 day or 1 dose.    Marland Kitchen LINZESS 290 MCG CAPS capsule Take 290 mcg by mouth daily as needed.  11   No current facility-administered medications for this visit.    PHYSICAL  EXAMINATION: ECOG PERFORMANCE STATUS: 1 - Symptomatic but completely ambulatory  Filed Vitals:   09/10/14 0917  BP: 126/79  Pulse: 68  Temp: 97.9 F (36.6 C)  Resp: 18   Filed Weights   09/10/14 0917  Weight: 123 lb 3.2 oz (55.883 kg)    GENERAL:alert, no distress and comfortable SKIN: skin color, texture, turgor are normal, no rashes or significant lesions EYES: normal, Conjunctiva are pink and non-injected, sclera clear OROPHARYNX:no exudate, no erythema and lips, buccal mucosa, and tongue normal  NECK: supple, thyroid normal size, non-tender, without nodularity LYMPH:  no palpable lymphadenopathy in the cervical, axillary or inguinal LUNGS: clear to auscultation and percussion with normal breathing effort HEART: regular rate & rhythm and no murmurs and no lower extremity edema ABDOMEN:abdomen soft, non-tender and normal bowel sounds Musculoskeletal:no cyanosis of digits and no clubbing  NEURO: alert & oriented x 3 with fluent speech, no focal motor/sensory deficits  LABORATORY DATA:  I have reviewed the data as listed   Chemistry      Component Value Date/Time   NA 141 09/03/2014 0914   NA 139 05/28/2014 1330   K 3.5 09/03/2014 0914   K 4.3 05/28/2014 1330   CL 105 05/28/2014 1330   CO2 23 09/03/2014 0914   CO2 28 05/28/2014 1330   BUN 10.3 09/03/2014 0914   BUN 11 05/28/2014 1330   CREATININE 0.7 09/03/2014 0914   CREATININE 0.75 05/28/2014 1330   CREATININE 0.72 12/11/2011 1238      Component Value Date/Time   CALCIUM 9.6 09/03/2014 0914   CALCIUM 9.6 05/28/2014 1330   ALKPHOS 154* 09/03/2014 0914   ALKPHOS 134* 04/16/2014 1430   AST 29 09/03/2014 0914   AST 19 04/16/2014 1430   ALT 42 09/03/2014 0914   ALT 24 04/16/2014 1430   BILITOT 0.31 09/03/2014 0914   BILITOT <0.2* 04/16/2014 1430       Lab Results  Component Value Date   WBC 2.9* 09/10/2014   HGB 13.0 09/10/2014   HCT 39.7 09/10/2014   MCV 93.6 09/10/2014   PLT 349 09/10/2014    NEUTROABS 1.6 09/10/2014    ASSESSMENT & PLAN:  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 Today is Abraxane week 6/12  Chemotherapy toxicities: 1. Severe headaches: Improved compared to cycle one but she still had headaches with cycle 2 that lasted for 4-5 days. She has taken Tylenol and Benadryl for headaches. 2. Severe back and chest wall pain related to Neulasta: Patient has not required hydrocodone for the back pain. She'll take Tylenol along with Claritin-D and that seems to be helping. 3. Alopecia 4. Chemo induced nausea/ vomiting and dehydration: after cycle 4 of AC. Given IV fluids. 5. Pneumonia after week 1 of Taxol: She also had a cough expectoration and low-grade temperatures of  99. Treated with Levaquin 6. Darkening of nails and loss of eyebrows 7. Vivid dreams 8. Borderline neutropenia - abraxane reduced to 70m/m starting with cycle 5   Jeda is doing well today. She meets with the nutritionist to discuss ways to incorporate more protein in her diet to maintain her weight. Her ANC is down to 1.6 today, but hopefully the dose reduction started with the previous cycle of abraxane will reduce the likelihood that this will continue to trend downward. She will proceed with cycle 6 as planned today.   Chessa will return in 1 week for labs, a follow up visit, and cycle 7 of treatment  No orders of the defined types were placed in this encounter.   The patient has a good understanding of the overall plan. she agrees with it. She will call with any problems that may develop  before her next visit here.   HLaurie Panda NP

## 2014-09-16 NOTE — Assessment & Plan Note (Signed)
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 Elevated AST and ALT: normalized and did not require dose adjustments. 2. Anxiety and jitteriness: We decreased the dosage of Decadron from cycle 2.I encouraged her to take Ativan at bedtime to sleep. 3. Alopecia 4. Sialitis 5. Chemotherapy-induced anemia grade 1 6. Fatigue: For 4-5 days after chemotherapy 7. 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 8. Diarrhea with dehydration did not respond to Imodium required IV fluids after cycle 3 9. Rash on her shoulders and chest with mild pruritus: Treated with Benadryl and Pepcid 10 Neutropenia after fourth cycle of Adriamycin required holding treatment for a week and neupogen. Patient's dose of Abraxane reduced to 65 mg/m with week 3. 11. Depressive symptoms: Patient is tearful and goes through these emotional roller coaster with crying and being angry. She is talking to a psychologist at it appears to be helping her. I offered her antidepressant therapy but because she takes other medications she would like to hold off on this at this time.  We'll continue to closely monitor her for toxicities. Return to clinic in 2 weeks with cycle #8.

## 2014-09-17 ENCOUNTER — Other Ambulatory Visit (HOSPITAL_BASED_OUTPATIENT_CLINIC_OR_DEPARTMENT_OTHER): Payer: 59

## 2014-09-17 ENCOUNTER — Encounter: Payer: Self-pay | Admitting: *Deleted

## 2014-09-17 ENCOUNTER — Ambulatory Visit (HOSPITAL_BASED_OUTPATIENT_CLINIC_OR_DEPARTMENT_OTHER): Payer: 59 | Admitting: Hematology and Oncology

## 2014-09-17 ENCOUNTER — Ambulatory Visit (HOSPITAL_BASED_OUTPATIENT_CLINIC_OR_DEPARTMENT_OTHER): Payer: 59

## 2014-09-17 ENCOUNTER — Other Ambulatory Visit: Payer: 59

## 2014-09-17 VITALS — BP 112/68 | HR 68 | Temp 97.5°F | Resp 18 | Ht 62.0 in | Wt 125.7 lb

## 2014-09-17 DIAGNOSIS — C773 Secondary and unspecified malignant neoplasm of axilla and upper limb lymph nodes: Secondary | ICD-10-CM

## 2014-09-17 DIAGNOSIS — R5383 Other fatigue: Secondary | ICD-10-CM

## 2014-09-17 DIAGNOSIS — G620 Drug-induced polyneuropathy: Secondary | ICD-10-CM

## 2014-09-17 DIAGNOSIS — R6889 Other general symptoms and signs: Secondary | ICD-10-CM

## 2014-09-17 DIAGNOSIS — C50411 Malignant neoplasm of upper-outer quadrant of right female breast: Secondary | ICD-10-CM | POA: Diagnosis not present

## 2014-09-17 DIAGNOSIS — Z5111 Encounter for antineoplastic chemotherapy: Secondary | ICD-10-CM

## 2014-09-17 DIAGNOSIS — D6481 Anemia due to antineoplastic chemotherapy: Secondary | ICD-10-CM

## 2014-09-17 LAB — CBC WITH DIFFERENTIAL/PLATELET
BASO%: 0.9 % (ref 0.0–2.0)
Basophils Absolute: 0 10*3/uL (ref 0.0–0.1)
EOS%: 3.1 % (ref 0.0–7.0)
Eosinophils Absolute: 0.1 10*3/uL (ref 0.0–0.5)
HCT: 37.1 % (ref 34.8–46.6)
HGB: 12.3 g/dL (ref 11.6–15.9)
LYMPH#: 0.8 10*3/uL — AB (ref 0.9–3.3)
LYMPH%: 25.8 % (ref 14.0–49.7)
MCH: 30.8 pg (ref 25.1–34.0)
MCHC: 33.2 g/dL (ref 31.5–36.0)
MCV: 93 fL (ref 79.5–101.0)
MONO#: 0.3 10*3/uL (ref 0.1–0.9)
MONO%: 10.1 % (ref 0.0–14.0)
NEUT%: 60.1 % (ref 38.4–76.8)
NEUTROS ABS: 1.9 10*3/uL (ref 1.5–6.5)
Platelets: 340 10*3/uL (ref 145–400)
RBC: 3.99 10*6/uL (ref 3.70–5.45)
RDW: 15.1 % — AB (ref 11.2–14.5)
WBC: 3.2 10*3/uL — ABNORMAL LOW (ref 3.9–10.3)

## 2014-09-17 LAB — COMPREHENSIVE METABOLIC PANEL (CC13)
ALBUMIN: 3.9 g/dL (ref 3.5–5.0)
ALT: 40 U/L (ref 0–55)
ANION GAP: 10 meq/L (ref 3–11)
AST: 26 U/L (ref 5–34)
Alkaline Phosphatase: 146 U/L (ref 40–150)
BUN: 7.5 mg/dL (ref 7.0–26.0)
CALCIUM: 9.6 mg/dL (ref 8.4–10.4)
CO2: 25 meq/L (ref 22–29)
Chloride: 107 mEq/L (ref 98–109)
Creatinine: 0.7 mg/dL (ref 0.6–1.1)
EGFR: >90 mL/min/{1.73_m2} (ref >90)
GLUCOSE: 106 mg/dL (ref 70–140)
Potassium: 3.5 mEq/L (ref 3.5–5.1)
SODIUM: 142 meq/L (ref 136–145)
Total Bilirubin: 0.23 mg/dL (ref 0.20–1.20)
Total Protein: 6.8 g/dL (ref 6.4–8.3)

## 2014-09-17 MED ORDER — SODIUM CHLORIDE 0.9 % IV SOLN
Freq: Once | INTRAVENOUS | Status: AC
  Administered 2014-09-17: 11:00:00 via INTRAVENOUS

## 2014-09-17 MED ORDER — PACLITAXEL PROTEIN-BOUND CHEMO INJECTION 100 MG
70.0000 mg/m2 | Freq: Once | INTRAVENOUS | Status: AC
Start: 2014-09-17 — End: 2014-09-17
  Administered 2014-09-17: 100 mg via INTRAVENOUS
  Filled 2014-09-17: qty 20

## 2014-09-17 MED ORDER — SODIUM CHLORIDE 0.9 % IJ SOLN
10.0000 mL | INTRAMUSCULAR | Status: DC | PRN
  Administered 2014-09-17: 10 mL
  Filled 2014-09-17: qty 10

## 2014-09-17 MED ORDER — SODIUM CHLORIDE 0.9 % IV SOLN
Freq: Once | INTRAVENOUS | Status: AC
  Administered 2014-09-17: 11:00:00 via INTRAVENOUS
  Filled 2014-09-17: qty 4

## 2014-09-17 MED ORDER — HEPARIN SOD (PORK) LOCK FLUSH 100 UNIT/ML IV SOLN
500.0000 [IU] | Freq: Once | INTRAVENOUS | Status: AC | PRN
  Administered 2014-09-17: 500 [IU]
  Filled 2014-09-17: qty 5

## 2014-09-17 NOTE — Progress Notes (Signed)
Patient Care Team: [name], MD as PCP - General (Internal Medicine) [name], MD as Consulting Physician (General Surgery) [name], MD as Consulting Physician (Hematology and Oncology) [name], MD as Consulting Physician (Radiation Oncology)  DIAGNOSIS: Breast cancer of upper-outer quadrant of right female breast   Staging form: Breast, AJCC 7th Edition     Clinical: Stage IA (T1b, N0, cM0) - Signed by Rulon Eisenmenger, MD on 03/20/2014     Pathologic: Stage IIB (T2, N1, cM0) - Unsigned   SUMMARY OF ONCOLOGIC HISTORY:   Breast cancer of upper-outer quadrant of right female breast   03/12/2014 Initial Biopsy Invasive ductal carcinoma with DCIS, right axillary lymph node biopsy negative, ER percent, PR 84%, Ki-67 16%, HER-2 negative   03/20/2014 Breast MRI Right breast upper outer quadrant 2.6 x 1.9 x 1.7 cm invasive ductal carcinoma with extension and involvement of the adjacent skin and nipple area complex, no abnormal lymph nodes   04/20/2014 Surgery Right breast lumpectomy: IDC grade 2; 2.5 cm with intermediate to high-grade DCIS margins negative: 1/3 positive sentinel lymph node with extracapsular extension, ER 100%, PR 84%, HER-2 negative ratio 1.22, Ki-67 16% T2, N1, M0 stage II B   06/04/2014 -  Chemotherapy Adjuvant chemotherapy dose dense Adriamycin and Cytoxan 4 followed by Taxol 12    CHIEF COMPLIANT: Cycle 6/12 Abraxane  INTERVAL HISTORY: Mckenzie L Modesitt is a 59 year old lady with above-mentioned history of right-sided breast cancer currently on adjuvant chemotherapy with weekly Abraxane. She is tolerating Abraxane fairly well. Denies any new problems or concerns other than a recent fall when she fell off a stepladder while she was cleaning her windows.  REVIEW OF SYSTEMS:   Constitutional: Denies fevers, chills or abnormal weight loss Eyes: Denies blurriness of vision Ears, nose, mouth, throat, and face: Denies mucositis or sore throat Respiratory:  Denies cough, dyspnea or wheezes Cardiovascular: Denies palpitation, chest discomfort or lower extremity swelling Gastrointestinal:  Denies nausea, heartburn or change in bowel habits Skin: Denies abnormal skin rashes Lymphatics: Denies new lymphadenopathy or easy bruising Neurological:Denies numbness, tingling or new weaknesses Behavioral/Psych: Mood is stable, no new changes  Breast:  denies any pain or lumps or nodules in either breasts All other systems were reviewed with the patient and are negative.  I have reviewed the past medical history, past surgical history, social history and family history with the patient and they are unchanged from previous note.  ALLERGIES:  is allergic to shellfish allergy; ivp dye; and penicillins.  MEDICATIONS:  Current Outpatient Prescriptions  Medication Sig Dispense Refill  . acetaminophen (TYLENOL) 325 MG tablet Take 325 mg by mouth 1 day or 1 dose.    Marland Kitchen amLODipine (NORVASC) 10 MG tablet Take 10 mg by mouth daily.    Marland Kitchen HYDROcodone-acetaminophen (NORCO/VICODIN) 5-325 MG per tablet Take 1-2 tablets by mouth every 4 (four) hours as needed for moderate pain or severe pain. 30 tablet 0  . irbesartan (AVAPRO) 150 MG tablet     . lidocaine-prilocaine (EMLA) cream   3  . LINZESS 290 MCG CAPS capsule Take 290 mcg by mouth daily as needed.  11  . LORazepam (ATIVAN) 0.5 MG tablet   0  . metoprolol succinate (TOPROL-XL) 50 MG 24 hr tablet Take 50 mg by mouth daily. Take with or immediately following a meal.    . sertraline (ZOLOFT) 25 MG tablet at bedtime.   11  . simvastatin (ZOCOR) 40 MG tablet Take 40 mg by mouth every evening.  No current facility-administered medications for this visit.    PHYSICAL EXAMINATION: ECOG PERFORMANCE STATUS: 1 - Symptomatic but completely ambulatory  Filed Vitals:   09/17/14 0905  BP: 112/68  Pulse: 68  Temp: 97.5 F (36.4 C)  Resp: 18   Filed Weights   09/17/14 0905  Weight: 125 lb 11.2 oz (57.017 kg)     GENERAL:alert, no distress and comfortable SKIN: skin color, texture, turgor are normal, no rashes or significant lesions EYES: normal, Conjunctiva are pink and non-injected, sclera clear OROPHARYNX:no exudate, no erythema and lips, buccal mucosa, and tongue normal  NECK: supple, thyroid normal size, non-tender, without nodularity LYMPH:  no palpable lymphadenopathy in the cervical, axillary or inguinal LUNGS: clear to auscultation and percussion with normal breathing effort HEART: regular rate & rhythm and no murmurs and no lower extremity edema ABDOMEN:abdomen soft, non-tender and normal bowel sounds Musculoskeletal:no cyanosis of digits and no clubbing  NEURO: alert & oriented x 3 with fluent speech, no focal motor/sensory deficits  LABORATORY DATA:  I have reviewed the data as listed   Chemistry      Component Value Date/Time   NA 142 09/17/2014 0855   NA 140 09/10/2014 0909   K 3.5 09/17/2014 0855   K 3.6 09/10/2014 0909   CL 105 09/10/2014 0909   CO2 25 09/17/2014 0855   CO2 28 09/10/2014 0909   BUN 7.5 09/17/2014 0855   BUN 7 09/10/2014 0909   CREATININE 0.7 09/17/2014 0855   CREATININE 0.65 09/10/2014 0909   CREATININE 0.72 12/11/2011 1238      Component Value Date/Time   CALCIUM 9.6 09/17/2014 0855   CALCIUM 10.1 09/10/2014 0909   ALKPHOS 146 09/17/2014 0855   ALKPHOS 142* 09/10/2014 0909   AST 26 09/17/2014 0855   AST 31 09/10/2014 0909   ALT 40 09/17/2014 0855   ALT 41* 09/10/2014 0909   BILITOT 0.23 09/17/2014 0855   BILITOT 0.3 09/10/2014 0909       Lab Results  Component Value Date   WBC 3.2* 09/17/2014   HGB 12.3 09/17/2014   HCT 37.1 09/17/2014   MCV 93.0 09/17/2014   PLT 340 09/17/2014   NEUTROABS 1.9 09/17/2014   ASSESSMENT & PLAN:  Breast cancer of upper-outer quadrant of right female breast Left breast invasive ductal carcinoma 5.2 cm grade 2 one out of 5 lymph nodes positive with diffuse lymphovascular invasion noted a Ki-67 of 57% ER  100%, PR 0%, HER-2 negative T3, N1, M0 stage IIIa BRCA1 positive  Current treatment: Dose dense Adriamycin and Cytoxan 4 followed by Abraxane 12  Today is cycle 6/12 of weekly Abraxane  Toxicities of chemotherapy: 1. Elevated AST and ALT: normalized and did not require dose adjustments. 2. Anxiety and jitteriness: We decreased the dosage of Decadron from cycle 2.I encouraged her to take Ativan at bedtime to sleep. 3. Alopecia 4. Sialitis 5. Chemotherapy-induced anemia grade 1 6. Fatigue: For 4-5 days after chemotherapy 7. 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 8. Diarrhea with dehydration did not respond to Imodium required IV fluids after cycle 3 9. Rash on her shoulders and chest with mild pruritus: Treated with Benadryl and Pepcid 10 Neutropenia after fourth cycle of Adriamycin required holding treatment for a week and neupogen. Patient's dose of Abraxane reduced to 65 mg/m with week 3. 11. Depressive symptoms: Patient is tearful and goes through these emotional roller coaster with crying and being angry. She is talking to a psychologist at  it appears to be helping her. I offered her antidepressant therapy but because she takes other medications she would like to hold off on this at this time.  I recommended guided imagery to help her relax and sleep better.  We'll continue to closely monitor her for toxicities. Return to clinic in 2 weeks with cycle #8.  No orders of the defined types were placed in this encounter.   The patient has a good understanding of the overall plan. she agrees with it. She will call with any problems that may develop before her next visit here.   Rulon Eisenmenger, MD

## 2014-09-17 NOTE — Progress Notes (Signed)
Met with pt prior to chemotherapy regimen. Discussed need for pt to continue to take her anti-anxiety medications and lorazepam d/t anxiety and inability to sleep. Instructed pt to take her zoloft as directed and not to skip days. Encourage pt to take lorazepam at night prior to bedtime to help with sleep and decrease anxiety.  Received verbal understanding.  Encourage pt to call with questions or needs.

## 2014-09-17 NOTE — Patient Instructions (Signed)
[facility] Discharge Instructions for Patients Receiving Chemotherapy  Today you received the following chemotherapy agent: Abraxane   To help prevent nausea and vomiting after your treatment, we encourage you to take your nausea medication as prescribed.    If you develop nausea and vomiting that is not controlled by your nausea medication, call the clinic.   BELOW ARE SYMPTOMS THAT SHOULD BE REPORTED IMMEDIATELY:  *FEVER GREATER THAN 100.5 F  *CHILLS WITH OR WITHOUT FEVER  NAUSEA AND VOMITING THAT IS NOT CONTROLLED WITH YOUR NAUSEA MEDICATION  *UNUSUAL SHORTNESS OF BREATH  *UNUSUAL BRUISING OR BLEEDING  TENDERNESS IN MOUTH AND THROAT WITH OR WITHOUT PRESENCE OF ULCERS  *URINARY PROBLEMS  *BOWEL PROBLEMS  UNUSUAL RASH Items with * indicate a potential emergency and should be followed up as soon as possible.  Feel free to call the clinic you have any questions or concerns. The clinic phone number is [phone].  Please show the CHEMO ALERT CARD at check-in to the Emergency Department and triage nurse.   

## 2014-09-21 ENCOUNTER — Encounter: Payer: Self-pay | Admitting: Hematology and Oncology

## 2014-09-23 NOTE — Assessment & Plan Note (Addendum)
Left breast invasive ductal carcinoma 5.2 cm grade 2 one out of 5 lymph nodes positive with diffuse lymphovascular invasion noted a Ki-67 of 57% ER 100%, PR 0%, HER-2 negative T3, N1, M0 stage IIIa BRCA1 positive  Current treatment: Dose dense Adriamycin and Cytoxan 4 followed by Abraxane 12  Today is cycle 8/12 of weekly Abraxane  Toxicities of chemotherapy: 1. Elevated AST and ALT: normalized and did not require dose adjustments. 2. Anxiety and jitteriness: We decreased the dosage of Decadron from cycle 2.I encouraged her to take Ativan at bedtime to sleep. 3. Alopecia 4. Sialitis 5. Chemotherapy-induced anemia grade 1 6. Fatigue: For 4-5 days after chemotherapy 7. 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 8. Diarrhea with dehydration did not respond to Imodium required IV fluids after cycle 3 9. Rash on her shoulders and chest with mild pruritus: Treated with Benadryl and Pepcid 10 Neutropenia after fourth cycle of Adriamycin required holding treatment for a week and neupogen. Patient's dose of Abraxane reduced to 65 mg/m with week 3. 11. Depressive symptoms:Seeing a psychiatrist.  Burnis Medin continue to closely monitor her for toxicities. Return to clinic in 2 weeks for 10/12

## 2014-09-24 ENCOUNTER — Ambulatory Visit (HOSPITAL_BASED_OUTPATIENT_CLINIC_OR_DEPARTMENT_OTHER): Payer: 59

## 2014-09-24 ENCOUNTER — Telehealth: Payer: Self-pay | Admitting: Hematology and Oncology

## 2014-09-24 ENCOUNTER — Ambulatory Visit (HOSPITAL_BASED_OUTPATIENT_CLINIC_OR_DEPARTMENT_OTHER): Payer: 59 | Admitting: Hematology and Oncology

## 2014-09-24 ENCOUNTER — Other Ambulatory Visit: Payer: 59

## 2014-09-24 VITALS — BP 127/81 | HR 76 | Temp 97.8°F | Resp 18 | Ht 62.0 in | Wt 125.6 lb

## 2014-09-24 DIAGNOSIS — D6481 Anemia due to antineoplastic chemotherapy: Secondary | ICD-10-CM | POA: Diagnosis not present

## 2014-09-24 DIAGNOSIS — Z5111 Encounter for antineoplastic chemotherapy: Secondary | ICD-10-CM | POA: Diagnosis not present

## 2014-09-24 DIAGNOSIS — C773 Secondary and unspecified malignant neoplasm of axilla and upper limb lymph nodes: Secondary | ICD-10-CM | POA: Diagnosis not present

## 2014-09-24 DIAGNOSIS — R5383 Other fatigue: Secondary | ICD-10-CM

## 2014-09-24 DIAGNOSIS — C50411 Malignant neoplasm of upper-outer quadrant of right female breast: Secondary | ICD-10-CM

## 2014-09-24 LAB — CBC WITH DIFFERENTIAL/PLATELET
BASO%: 2.4 % — ABNORMAL HIGH (ref 0.0–2.0)
BASOS ABS: 0.1 10*3/uL (ref 0.0–0.1)
EOS%: 2.6 % (ref 0.0–7.0)
Eosinophils Absolute: 0.1 10*3/uL (ref 0.0–0.5)
HEMATOCRIT: 37.1 % (ref 34.8–46.6)
HGB: 12.3 g/dL (ref 11.6–15.9)
LYMPH%: 23.7 % (ref 14.0–49.7)
MCH: 30.2 pg (ref 25.1–34.0)
MCHC: 33.1 g/dL (ref 31.5–36.0)
MCV: 91.2 fL (ref 79.5–101.0)
MONO#: 0.3 10*3/uL (ref 0.1–0.9)
MONO%: 9.8 % (ref 0.0–14.0)
NEUT#: 1.8 10*3/uL (ref 1.5–6.5)
NEUT%: 61.5 % (ref 38.4–76.8)
Platelets: 367 10*3/uL (ref 145–400)
RBC: 4.07 10*6/uL (ref 3.70–5.45)
RDW: 16.4 % — ABNORMAL HIGH (ref 11.2–14.5)
WBC: 2.9 10*3/uL — ABNORMAL LOW (ref 3.9–10.3)
lymph#: 0.7 10*3/uL — ABNORMAL LOW (ref 0.9–3.3)

## 2014-09-24 LAB — COMPREHENSIVE METABOLIC PANEL (CC13)
ALT: 35 U/L (ref 0–55)
ANION GAP: 13 meq/L — AB (ref 3–11)
AST: 24 U/L (ref 5–34)
Albumin: 4 g/dL (ref 3.5–5.0)
Alkaline Phosphatase: 146 U/L (ref 40–150)
BILIRUBIN TOTAL: 0.3 mg/dL (ref 0.20–1.20)
BUN: 9.9 mg/dL (ref 7.0–26.0)
CALCIUM: 9.9 mg/dL (ref 8.4–10.4)
CHLORIDE: 106 meq/L (ref 98–109)
CO2: 24 meq/L (ref 22–29)
CREATININE: 0.7 mg/dL (ref 0.6–1.1)
EGFR: >90 mL/min/{1.73_m2} (ref >90)
Glucose: 97 mg/dl (ref 70–140)
Potassium: 3.6 mEq/L (ref 3.5–5.1)
SODIUM: 142 meq/L (ref 136–145)
TOTAL PROTEIN: 7.1 g/dL (ref 6.4–8.3)

## 2014-09-24 MED ORDER — HEPARIN SOD (PORK) LOCK FLUSH 100 UNIT/ML IV SOLN
500.0000 [IU] | Freq: Once | INTRAVENOUS | Status: AC | PRN
  Administered 2014-09-24: 500 [IU]
  Filled 2014-09-24: qty 5

## 2014-09-24 MED ORDER — SODIUM CHLORIDE 0.9 % IV SOLN
Freq: Once | INTRAVENOUS | Status: AC
  Administered 2014-09-24: 11:00:00 via INTRAVENOUS

## 2014-09-24 MED ORDER — SODIUM CHLORIDE 0.9 % IV SOLN
Freq: Once | INTRAVENOUS | Status: AC
  Administered 2014-09-24: 11:00:00 via INTRAVENOUS
  Filled 2014-09-24: qty 4

## 2014-09-24 MED ORDER — SODIUM CHLORIDE 0.9 % IJ SOLN
10.0000 mL | INTRAMUSCULAR | Status: DC | PRN
  Administered 2014-09-24: 10 mL
  Filled 2014-09-24: qty 10

## 2014-09-24 MED ORDER — PACLITAXEL PROTEIN-BOUND CHEMO INJECTION 100 MG
70.0000 mg/m2 | Freq: Once | INTRAVENOUS | Status: AC
  Administered 2014-09-24: 100 mg via INTRAVENOUS
  Filled 2014-09-24: qty 20

## 2014-09-24 NOTE — Progress Notes (Signed)
Patient Care Team: [name], MD as PCP - General (Internal Medicine) [name], MD as Consulting Physician (General Surgery) [name], MD as Consulting Physician (Hematology and Oncology) [name], MD as Consulting Physician (Radiation Oncology)  DIAGNOSIS: Breast cancer of upper-outer quadrant of right female breast   Staging form: Breast, AJCC 7th Edition     Clinical: Stage IA (T1b, N0, cM0) - Signed by Rulon Eisenmenger, MD on 03/20/2014     Pathologic: Stage IIB (T2, N1, cM0) - Unsigned   SUMMARY OF ONCOLOGIC HISTORY:   Breast cancer of upper-outer quadrant of right female breast   03/12/2014 Initial Biopsy Invasive ductal carcinoma with DCIS, right axillary lymph node biopsy negative, ER percent, PR 84%, Ki-67 16%, HER-2 negative   03/20/2014 Breast MRI Right breast upper outer quadrant 2.6 x 1.9 x 1.7 cm invasive ductal carcinoma with extension and involvement of the adjacent skin and nipple area complex, no abnormal lymph nodes   04/20/2014 Surgery Right breast lumpectomy: IDC grade 2; 2.5 cm with intermediate to high-grade DCIS margins negative: 1/3 positive sentinel lymph node with extracapsular extension, ER 100%, PR 84%, HER-2 negative ratio 1.22, Ki-67 16% T2, N1, M0 stage II B   06/04/2014 -  Chemotherapy Adjuvant chemotherapy dose dense Adriamycin and Cytoxan 4 followed by Abraxane 12    CHIEF COMPLIANT: adjuvant chemotherapy currently on Abraxane  INTERVAL HISTORY: Mckenzie Sanford is a  59 year old with above-mentioned history of right-sided breast cancer currently on adjuvant chemotherapy with Abraxane. She is tolerating it much better. Does not have neuropathy dyspnea and discoloration. Her appetite and energy levels are good. Denies any fevers or chills.  REVIEW OF SYSTEMS:   Constitutional: Denies fevers, chills or abnormal weight loss Eyes: Denies blurriness of vision Ears, nose, mouth, throat, and face: Denies mucositis or sore  throat Respiratory: Denies cough, dyspnea or wheezes Cardiovascular: Denies palpitation, chest discomfort or lower extremity swelling Gastrointestinal:  Denies nausea, heartburn or change in bowel habits Skin: Denies abnormal skin rashes Lymphatics: Denies new lymphadenopathy or easy bruising Neurological:Denies numbness, tingling or new weaknesses Behavioral/Psych: Mood is stable, no new changes  Breast:  denies any pain or lumps or nodules in either breasts All other systems were reviewed with the patient and are negative.  I have reviewed the past medical history, past surgical history, social history and family history with the patient and they are unchanged from previous note.  ALLERGIES:  is allergic to shellfish allergy; ivp dye; and penicillins.  MEDICATIONS:  Current Outpatient Prescriptions  Medication Sig Dispense Refill  . acetaminophen (TYLENOL) 325 MG tablet Take 325 mg by mouth 1 day or 1 dose.    Marland Kitchen amLODipine (NORVASC) 10 MG tablet Take 10 mg by mouth daily.    Marland Kitchen HYDROcodone-acetaminophen (NORCO/VICODIN) 5-325 MG per tablet Take 1-2 tablets by mouth every 4 (four) hours as needed for moderate pain or severe pain. 30 tablet 0  . irbesartan (AVAPRO) 150 MG tablet     . lidocaine-prilocaine (EMLA) cream   3  . LINZESS 290 MCG CAPS capsule Take 290 mcg by mouth daily as needed.  11  . LORazepam (ATIVAN) 0.5 MG tablet   0  . metoprolol succinate (TOPROL-XL) 50 MG 24 hr tablet Take 50 mg by mouth daily. Take with or immediately following a meal.    . sertraline (ZOLOFT) 25 MG tablet at bedtime.   11  . simvastatin (ZOCOR) 40 MG tablet Take 40 mg by mouth every evening.     No current  facility-administered medications for this visit.    PHYSICAL EXAMINATION: ECOG PERFORMANCE STATUS: 1 - Symptomatic but completely ambulatory  Filed Vitals:   09/24/14 0915  BP: 127/81  Pulse: 76  Temp: 97.8 F (36.6 C)  Resp: 18   Filed Weights   09/24/14 0915  Weight: 125 lb 9.6  oz (56.972 kg)    GENERAL:alert, no distress and comfortable SKIN: skin color, texture, turgor are normal, no rashes or significant lesions EYES: normal, Conjunctiva are pink and non-injected, sclera clear OROPHARYNX:no exudate, no erythema and lips, buccal mucosa, and tongue normal  NECK: supple, thyroid normal size, non-tender, without nodularity LYMPH:  no palpable lymphadenopathy in the cervical, axillary or inguinal LUNGS: clear to auscultation and percussion with normal breathing effort HEART: regular rate & rhythm and no murmurs and no lower extremity edema ABDOMEN:abdomen soft, non-tender and normal bowel sounds Musculoskeletal:no cyanosis of digits and no clubbing  NEURO: alert & oriented x 3 with fluent speech, no focal motor/sensory deficits   LABORATORY DATA:  I have reviewed the data as listed   Chemistry      Component Value Date/Time   NA 142 09/24/2014 0903   NA 140 09/10/2014 0909   K 3.6 09/24/2014 0903   K 3.6 09/10/2014 0909   CL 105 09/10/2014 0909   CO2 24 09/24/2014 0903   CO2 28 09/10/2014 0909   BUN 9.9 09/24/2014 0903   BUN 7 09/10/2014 0909   CREATININE 0.7 09/24/2014 0903   CREATININE 0.65 09/10/2014 0909   CREATININE 0.72 12/11/2011 1238      Component Value Date/Time   CALCIUM 9.9 09/24/2014 0903   CALCIUM 10.1 09/10/2014 0909   ALKPHOS 146 09/24/2014 0903   ALKPHOS 142* 09/10/2014 0909   AST 24 09/24/2014 0903   AST 31 09/10/2014 0909   ALT 35 09/24/2014 0903   ALT 41* 09/10/2014 0909   BILITOT 0.30 09/24/2014 0903   BILITOT 0.3 09/10/2014 0909       Lab Results  Component Value Date   WBC 3.2* 09/17/2014   HGB 12.3 09/17/2014   HCT 37.1 09/17/2014   MCV 93.0 09/17/2014   PLT 340 09/17/2014   NEUTROABS 1.9 09/17/2014    ASSESSMENT & PLAN:  Breast cancer of upper-outer quadrant of right female breast 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 Elevated AST and ALT: normalized and did not require dose adjustments. 2. Anxiety and jitteriness: We decreased the dosage of Decadron from cycle 2.I encouraged her to take Ativan at bedtime to sleep. 3. Alopecia 4. Sialitis 5. Chemotherapy-induced anemia grade 1 6. Fatigue: For 4-5 days after chemotherapy 7. 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 8. Diarrhea with dehydration did not respond to Imodium required IV fluids after cycle 3 9. Rash on her shoulders and chest with mild pruritus: Treated with Benadryl and Pepcid 10 Neutropenia after fourth cycle of Adriamycin required holding treatment for a week and neupogen. Patient's dose of Abraxane reduced to 65 mg/m with week 3. 11. Depressive symptoms:Seeing a psychiatrist.  Burnis Medin continue to closely monitor her for toxicities. Return to clinic in 2 weeks for 10/12  No orders of the defined types were placed in this encounter.   The patient has a good understanding of the overall plan. she agrees with it. She will call with any problems that may develop before her next visit here.   Rulon Eisenmenger, MD

## 2014-09-24 NOTE — Telephone Encounter (Signed)
gave and printed papt sched and avs for pt for May....sed added tx.

## 2014-09-24 NOTE — Patient Instructions (Signed)
[facility] Discharge Instructions for Patients Receiving Chemotherapy  Today you received the following chemotherapy agents: abraxane  To help prevent nausea and vomiting after your treatment, we encourage you to take your nausea medication.  Take it as often as prescribed.     If you develop nausea and vomiting that is not controlled by your nausea medication, call the clinic. If it is after clinic hours your family physician or the after hours number for the clinic or go to the Emergency Department.   BELOW ARE SYMPTOMS THAT SHOULD BE REPORTED IMMEDIATELY:  *FEVER GREATER THAN 100.5 F  *CHILLS WITH OR WITHOUT FEVER  NAUSEA AND VOMITING THAT IS NOT CONTROLLED WITH YOUR NAUSEA MEDICATION  *UNUSUAL SHORTNESS OF BREATH  *UNUSUAL BRUISING OR BLEEDING  TENDERNESS IN MOUTH AND THROAT WITH OR WITHOUT PRESENCE OF ULCERS  *URINARY PROBLEMS  *BOWEL PROBLEMS  UNUSUAL RASH Items with * indicate a potential emergency and should be followed up as soon as possible.  Feel free to call the clinic you have any questions or concerns. The clinic phone number is [phone].   I have been informed and understand all the instructions given to me. I know to contact the clinic, my physician, or go to the Emergency Department if any problems should occur. I do not have any questions at this time, but understand that I may call the clinic during office hours   should I have any questions or need assistance in obtaining follow up care.    __________________________________________  _____________  __________ Signature of Patient or Authorized Representative            Date                   Time    __________________________________________ Nurse's Signature   

## 2014-10-01 ENCOUNTER — Other Ambulatory Visit (HOSPITAL_BASED_OUTPATIENT_CLINIC_OR_DEPARTMENT_OTHER): Payer: 59

## 2014-10-01 ENCOUNTER — Ambulatory Visit (HOSPITAL_BASED_OUTPATIENT_CLINIC_OR_DEPARTMENT_OTHER): Payer: 59

## 2014-10-01 ENCOUNTER — Other Ambulatory Visit: Payer: 59

## 2014-10-01 ENCOUNTER — Ambulatory Visit: Payer: 59 | Admitting: Hematology and Oncology

## 2014-10-01 ENCOUNTER — Ambulatory Visit (HOSPITAL_BASED_OUTPATIENT_CLINIC_OR_DEPARTMENT_OTHER): Payer: 59 | Admitting: Hematology and Oncology

## 2014-10-01 VITALS — BP 125/82 | HR 69 | Temp 97.9°F | Resp 18 | Ht 62.0 in | Wt 125.2 lb

## 2014-10-01 DIAGNOSIS — C50411 Malignant neoplasm of upper-outer quadrant of right female breast: Secondary | ICD-10-CM

## 2014-10-01 DIAGNOSIS — Z5111 Encounter for antineoplastic chemotherapy: Secondary | ICD-10-CM

## 2014-10-01 DIAGNOSIS — Z170 Estrogen receptor positive status [ER+]: Secondary | ICD-10-CM

## 2014-10-01 DIAGNOSIS — D701 Agranulocytosis secondary to cancer chemotherapy: Secondary | ICD-10-CM

## 2014-10-01 LAB — COMPREHENSIVE METABOLIC PANEL (CC13)
ALT: 27 U/L (ref 0–55)
AST: 20 U/L (ref 5–34)
Albumin: 3.9 g/dL (ref 3.5–5.0)
Alkaline Phosphatase: 128 U/L (ref 40–150)
Anion Gap: 13 mEq/L — ABNORMAL HIGH (ref 3–11)
BUN: 11.5 mg/dL (ref 7.0–26.0)
CALCIUM: 9.6 mg/dL (ref 8.4–10.4)
CHLORIDE: 107 meq/L (ref 98–109)
CO2: 22 mEq/L (ref 22–29)
CREATININE: 0.7 mg/dL (ref 0.6–1.1)
EGFR: >90 mL/min/{1.73_m2} (ref >90)
Glucose: 89 mg/dl (ref 70–140)
Potassium: 3.5 mEq/L (ref 3.5–5.1)
Sodium: 142 mEq/L (ref 136–145)
Total Protein: 6.8 g/dL (ref 6.4–8.3)

## 2014-10-01 LAB — CBC WITH DIFFERENTIAL/PLATELET
BASO%: 3.3 % — AB (ref 0.0–2.0)
Basophils Absolute: 0.1 10*3/uL (ref 0.0–0.1)
EOS%: 3.7 % (ref 0.0–7.0)
Eosinophils Absolute: 0.1 10*3/uL (ref 0.0–0.5)
HCT: 36.3 % (ref 34.8–46.6)
HGB: 12.1 g/dL (ref 11.6–15.9)
LYMPH#: 0.9 10*3/uL (ref 0.9–3.3)
LYMPH%: 33.1 % (ref 14.0–49.7)
MCH: 30.6 pg (ref 25.1–34.0)
MCHC: 33.3 g/dL (ref 31.5–36.0)
MCV: 91.9 fL (ref 79.5–101.0)
MONO#: 0.4 10*3/uL (ref 0.1–0.9)
MONO%: 14.9 % — ABNORMAL HIGH (ref 0.0–14.0)
NEUT#: 1.2 10*3/uL — ABNORMAL LOW (ref 1.5–6.5)
NEUT%: 45 % (ref 38.4–76.8)
Platelets: 364 10*3/uL (ref 145–400)
RBC: 3.95 10*6/uL (ref 3.70–5.45)
RDW: 14.5 % (ref 11.2–14.5)
WBC: 2.7 10*3/uL — ABNORMAL LOW (ref 3.9–10.3)

## 2014-10-01 MED ORDER — PACLITAXEL PROTEIN-BOUND CHEMO INJECTION 100 MG
85.0000 mg | Freq: Once | INTRAVENOUS | Status: AC
  Administered 2014-10-01: 75 mg via INTRAVENOUS
  Filled 2014-10-01: qty 15

## 2014-10-01 MED ORDER — HEPARIN SOD (PORK) LOCK FLUSH 100 UNIT/ML IV SOLN
500.0000 [IU] | Freq: Once | INTRAVENOUS | Status: AC | PRN
  Administered 2014-10-01: 500 [IU]
  Filled 2014-10-01: qty 5

## 2014-10-01 MED ORDER — PACLITAXEL PROTEIN-BOUND CHEMO INJECTION 100 MG: 60.0000 mg/m2 | Freq: Once | INTRAVENOUS | Status: DC

## 2014-10-01 MED ORDER — SODIUM CHLORIDE 0.9 % IV SOLN
Freq: Once | INTRAVENOUS | Status: AC
  Administered 2014-10-01: 15:00:00 via INTRAVENOUS
  Filled 2014-10-01: qty 4

## 2014-10-01 MED ORDER — SODIUM CHLORIDE 0.9 % IJ SOLN
10.0000 mL | INTRAMUSCULAR | Status: DC | PRN
  Administered 2014-10-01: 10 mL
  Filled 2014-10-01: qty 10

## 2014-10-01 MED ORDER — SODIUM CHLORIDE 0.9 % IV SOLN
Freq: Once | INTRAVENOUS | Status: AC
  Administered 2014-10-01: 15:00:00 via INTRAVENOUS

## 2014-10-01 NOTE — Patient Instructions (Signed)
[facility] Discharge Instructions for Patients Receiving Chemotherapy  Today you received the following chemotherapy agents: abraxane  To help prevent nausea and vomiting after your treatment, we encourage you to take your nausea medication.  Take it as often as prescribed.     If you develop nausea and vomiting that is not controlled by your nausea medication, call the clinic. If it is after clinic hours your family physician or the after hours number for the clinic or go to the Emergency Department.   BELOW ARE SYMPTOMS THAT SHOULD BE REPORTED IMMEDIATELY:  *FEVER GREATER THAN 100.5 F  *CHILLS WITH OR WITHOUT FEVER  NAUSEA AND VOMITING THAT IS NOT CONTROLLED WITH YOUR NAUSEA MEDICATION  *UNUSUAL SHORTNESS OF BREATH  *UNUSUAL BRUISING OR BLEEDING  TENDERNESS IN MOUTH AND THROAT WITH OR WITHOUT PRESENCE OF ULCERS  *URINARY PROBLEMS  *BOWEL PROBLEMS  UNUSUAL RASH Items with * indicate a potential emergency and should be followed up as soon as possible.  Feel free to call the clinic you have any questions or concerns. The clinic phone number is [phone].   I have been informed and understand all the instructions given to me. I know to contact the clinic, my physician, or go to the Emergency Department if any problems should occur. I do not have any questions at this time, but understand that I may call the clinic during office hours   should I have any questions or need assistance in obtaining follow up care.    __________________________________________  _____________  __________ Signature of Patient or Authorized Representative            Date                   Time    __________________________________________ Nurse's Signature   

## 2014-10-01 NOTE — Progress Notes (Signed)
Per Dr. [name], ok to treat with abraxane with ANC 1200.  Infusion RN notified.

## 2014-10-01 NOTE — Progress Notes (Signed)
Patient Care Team: [name], MD as PCP - General (Internal Medicine) [name], MD as Consulting Physician (General Surgery) [name], MD as Consulting Physician (Hematology and Oncology) [name], MD as Consulting Physician (Radiation Oncology)  DIAGNOSIS: Breast cancer of upper-outer quadrant of right female breast   Staging form: Breast, AJCC 7th Edition     Clinical: Stage IA (T1b, N0, cM0) - Signed by Rulon Eisenmenger, MD on 03/20/2014     Pathologic: Stage IIB (T2, N1, cM0) - Unsigned   SUMMARY OF ONCOLOGIC HISTORY:   Breast cancer of upper-outer quadrant of right female breast   03/12/2014 Initial Biopsy Invasive ductal carcinoma with DCIS, right axillary lymph node biopsy negative, ER percent, PR 84%, Ki-67 16%, HER-2 negative   03/20/2014 Breast MRI Right breast upper outer quadrant 2.6 x 1.9 x 1.7 cm invasive ductal carcinoma with extension and involvement of the adjacent skin and nipple area complex, no abnormal lymph nodes   04/20/2014 Surgery Right breast lumpectomy: IDC grade 2; 2.5 cm with intermediate to high-grade DCIS margins negative: 1/3 positive sentinel lymph node with extracapsular extension, ER 100%, PR 84%, HER-2 negative ratio 1.22, Ki-67 16% T2, N1, M0 stage II B   06/04/2014 -  Chemotherapy Adjuvant chemotherapy dose dense Adriamycin and Cytoxan 4 followed by Abraxane 12    CHIEF COMPLIANT:  Abraxane week 9/12  INTERVAL HISTORY: Mckenzie Sanford is a  59 year old with above-mentioned history of right breast cancer status post lumpectomy currently on adjuvant chemotherapy. Is currently on Abraxane today is week 9/12. She is tolerating chemotherapy fairly well. She had some epigastric pain. He thinks may be acid reflux related to eating yogurt. She denies any nausea vomiting. Denies any neuropathy. Her nails are discolored. She is starting to get some hair.  REVIEW OF SYSTEMS:   Constitutional: Denies fevers, chills or abnormal weight  loss Eyes: Denies blurriness of vision Ears, nose, mouth, throat, and face: Denies mucositis or sore throat Respiratory: Denies cough, dyspnea or wheezes Cardiovascular: Denies palpitation, chest discomfort or lower extremity swelling Gastrointestinal:  Denies nausea, heartburn or change in bowel habits Skin: Denies abnormal skin rashes Lymphatics: Denies new lymphadenopathy or easy bruising Neurological:Denies numbness, tingling or new weaknesses Behavioral/Psych: Mood is stable, no new changes  Breast:  denies any pain or lumps or nodules in either breasts All other systems were reviewed with the patient and are negative.  I have reviewed the past medical history, past surgical history, social history and family history with the patient and they are unchanged from previous note.  ALLERGIES:  is allergic to shellfish allergy; ivp dye; and penicillins.  MEDICATIONS:  Current Outpatient Prescriptions  Medication Sig Dispense Refill  . acetaminophen (TYLENOL) 325 MG tablet Take 325 mg by mouth 1 day or 1 dose.    Marland Kitchen amLODipine (NORVASC) 10 MG tablet Take 10 mg by mouth daily.    Marland Kitchen HYDROcodone-acetaminophen (NORCO/VICODIN) 5-325 MG per tablet Take 1-2 tablets by mouth every 4 (four) hours as needed for moderate pain or severe pain. 30 tablet 0  . irbesartan (AVAPRO) 150 MG tablet     . lidocaine-prilocaine (EMLA) cream   3  . LINZESS 290 MCG CAPS capsule Take 290 mcg by mouth daily as needed.  11  . LORazepam (ATIVAN) 0.5 MG tablet   0  . metoprolol succinate (TOPROL-XL) 50 MG 24 hr tablet Take 50 mg by mouth daily. Take with or immediately following a meal.    . sertraline (ZOLOFT) 25 MG tablet at bedtime.  11  . simvastatin (ZOCOR) 40 MG tablet Take 40 mg by mouth every evening.     No current facility-administered medications for this visit.    PHYSICAL EXAMINATION: ECOG PERFORMANCE STATUS: 0 - Asymptomatic  Filed Vitals:   10/01/14 1404  BP: 125/82  Pulse: 69  Temp: 97.9 F  (36.6 C)  Resp: 18   Filed Weights   10/01/14 1404  Weight: 125 lb 3.2 oz (56.79 kg)    GENERAL:alert, no distress and comfortable SKIN: skin color, texture, turgor are normal, no rashes or significant lesions EYES: normal, Conjunctiva are pink and non-injected, sclera clear OROPHARYNX:no exudate, no erythema and lips, buccal mucosa, and tongue normal  NECK: supple, thyroid normal size, non-tender, without nodularity LYMPH:  no palpable lymphadenopathy in the cervical, axillary or inguinal LUNGS: clear to auscultation and percussion with normal breathing effort HEART: regular rate & rhythm and no murmurs and no lower extremity edema ABDOMEN:abdomen soft, non-tender and normal bowel sounds Musculoskeletal:no cyanosis of digits and no clubbing  NEURO: alert & oriented x 3 with fluent speech, no focal motor/sensory deficits  LABORATORY DATA:  I have reviewed the data as listed   Chemistry      Component Value Date/Time   NA 142 10/01/2014 1340   NA 140 09/10/2014 0909   K 3.5 10/01/2014 1340   K 3.6 09/10/2014 0909   CL 105 09/10/2014 0909   CO2 22 10/01/2014 1340   CO2 28 09/10/2014 0909   BUN 11.5 10/01/2014 1340   BUN 7 09/10/2014 0909   CREATININE 0.7 10/01/2014 1340   CREATININE 0.65 09/10/2014 0909   CREATININE 0.72 12/11/2011 1238      Component Value Date/Time   CALCIUM 9.6 10/01/2014 1340   CALCIUM 10.1 09/10/2014 0909   ALKPHOS 128 10/01/2014 1340   ALKPHOS 142* 09/10/2014 0909   AST 20 10/01/2014 1340   AST 31 09/10/2014 0909   ALT 27 10/01/2014 1340   ALT 41* 09/10/2014 0909   BILITOT <0.20 10/01/2014 1340   BILITOT 0.3 09/10/2014 0909       Lab Results  Component Value Date   WBC 2.7* 10/01/2014   HGB 12.1 10/01/2014   HCT 36.3 10/01/2014   MCV 91.9 10/01/2014   PLT 364 10/01/2014   NEUTROABS 1.2* 10/01/2014   ASSESSMENT & PLAN:  Breast cancer of upper-outer quadrant of right female breast Left breast invasive ductal carcinoma 5.2 cm grade 2  one out of 5 lymph nodes positive with diffuse lymphovascular invasion noted a Ki-67 of 57% ER 100%, PR 0%, HER-2 negative T3, N1, M0 stage IIIa BRCA1 positive  Current treatment: Dose dense Adriamycin and Cytoxan 4 followed by Abraxane 12  Today is cycle 9/12 of weekly Abraxane  Toxicities of chemotherapy: 1. Elevated AST and ALT: normalized and did not require dose adjustments. 2. Anxiety and jitteriness: We decreased the dosage of Decadron from cycle 2.I encouraged her to take Ativan at bedtime to sleep. 3. Alopecia 4. Sialitis 5. Chemotherapy-induced anemia grade 1 6. Fatigue: For 4-5 days after chemotherapy 7. 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 8. Diarrhea with dehydration did not respond to Imodium required IV fluids after cycle 3 9. Rash on her shoulders and chest with mild pruritus: Treated with Benadryl and Pepcid 10 Neutropenia after fourth cycle of Adriamycin required holding treatment for a week and neupogen. Patient's dose of Abraxane reduced to 60 mg/m with week 9. 11. Depressive symptoms:Seeing a psychiatrist.   we will  continue the chemotherapy even though her absolute neutrophil count is down to 1200. We will reduce the dosage of chemotherapy. We'll continue to closely monitor her for toxicities.   No orders of the defined types were placed in this encounter.   The patient has a good understanding of the overall plan. she agrees with it. she will call with any problems that may develop before the next visit here.   Rulon Eisenmenger, MD

## 2014-10-01 NOTE — Assessment & Plan Note (Signed)
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 Elevated AST and ALT: normalized and did not require dose adjustments. 2. Anxiety and jitteriness: We decreased the dosage of Decadron from cycle 2.I encouraged her to take Ativan at bedtime to sleep. 3. Alopecia 4. Sialitis 5. Chemotherapy-induced anemia grade 1 6. Fatigue: For 4-5 days after chemotherapy 7. 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 8. Diarrhea with dehydration did not respond to Imodium required IV fluids after cycle 3 9. Rash on her shoulders and chest with mild pruritus: Treated with Benadryl and Pepcid 10 Neutropenia after fourth cycle of Adriamycin required holding treatment for a week and neupogen. Patient's dose of Abraxane reduced to 65 mg/m with week 3. 11. Depressive symptoms:Seeing a psychiatrist.  Burnis Medin continue to closely monitor her for toxicities. Return to clinic in 2 weeks for 10/12

## 2014-10-06 ENCOUNTER — Encounter: Payer: Self-pay | Admitting: Hematology and Oncology

## 2014-10-06 NOTE — Progress Notes (Signed)
I faxed office notes/fax to [facility] [phone]

## 2014-10-07 NOTE — Assessment & Plan Note (Addendum)
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 Elevated AST and ALT: normalized and did not require dose adjustments. 2. Anxiety and jitteriness: We decreased the dosage of Decadron from cycle 2.I encouraged her to take Ativan at bedtime to sleep. 3. Alopecia 4. Sialitis 5. Chemotherapy-induced anemia grade 1 6. Fatigue: For 4-5 days after chemotherapy 7. 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 8. Diarrhea with dehydration did not respond to Imodium required IV fluids after cycle 3 9. Rash on her shoulders and chest with mild pruritus: Treated with Benadryl and Pepcid 10 Neutropenia after fourth cycle of Adriamycin required holding treatment for a week and neupogen. Patient's dose of Abraxane reduced to 65 mg/m with week 3. 11. Depressive symptoms:Seeing a psychiatrist.  Return to clinic in 2 weeks for cycle 12 which should be the last cycle of chemotherapy  Plan: 1. Adjuvant radiation therapy: We will arrange a consultation with radiation oncology 2. Followed by antiestrogen therapy    

## 2014-10-08 ENCOUNTER — Telehealth: Payer: Self-pay | Admitting: Hematology and Oncology

## 2014-10-08 ENCOUNTER — Ambulatory Visit (HOSPITAL_BASED_OUTPATIENT_CLINIC_OR_DEPARTMENT_OTHER): Payer: 59

## 2014-10-08 ENCOUNTER — Other Ambulatory Visit: Payer: Self-pay | Admitting: *Deleted

## 2014-10-08 ENCOUNTER — Ambulatory Visit (HOSPITAL_BASED_OUTPATIENT_CLINIC_OR_DEPARTMENT_OTHER): Payer: 59 | Admitting: Hematology and Oncology

## 2014-10-08 ENCOUNTER — Other Ambulatory Visit (HOSPITAL_BASED_OUTPATIENT_CLINIC_OR_DEPARTMENT_OTHER): Payer: 59

## 2014-10-08 VITALS — BP 136/73 | HR 69 | Temp 98.0°F | Resp 18 | Ht 62.0 in | Wt 126.9 lb

## 2014-10-08 DIAGNOSIS — C50411 Malignant neoplasm of upper-outer quadrant of right female breast: Secondary | ICD-10-CM | POA: Diagnosis not present

## 2014-10-08 DIAGNOSIS — Z170 Estrogen receptor positive status [ER+]: Secondary | ICD-10-CM | POA: Diagnosis not present

## 2014-10-08 DIAGNOSIS — Z5111 Encounter for antineoplastic chemotherapy: Secondary | ICD-10-CM

## 2014-10-08 DIAGNOSIS — F419 Anxiety disorder, unspecified: Secondary | ICD-10-CM | POA: Diagnosis not present

## 2014-10-08 LAB — CBC WITH DIFFERENTIAL/PLATELET
BASO%: 2.8 % — ABNORMAL HIGH (ref 0.0–2.0)
BASOS ABS: 0.1 10*3/uL (ref 0.0–0.1)
EOS%: 2.2 % (ref 0.0–7.0)
Eosinophils Absolute: 0.1 10*3/uL (ref 0.0–0.5)
HCT: 37.4 % (ref 34.8–46.6)
HEMOGLOBIN: 12.7 g/dL (ref 11.6–15.9)
LYMPH#: 0.7 10*3/uL — AB (ref 0.9–3.3)
LYMPH%: 20.9 % (ref 14.0–49.7)
MCH: 30.6 pg (ref 25.1–34.0)
MCHC: 33.8 g/dL (ref 31.5–36.0)
MCV: 90.5 fL (ref 79.5–101.0)
MONO#: 0.5 10*3/uL (ref 0.1–0.9)
MONO%: 14.6 % — ABNORMAL HIGH (ref 0.0–14.0)
NEUT%: 59.5 % (ref 38.4–76.8)
NEUTROS ABS: 2 10*3/uL (ref 1.5–6.5)
Platelets: 366 10*3/uL (ref 145–400)
RBC: 4.14 10*6/uL (ref 3.70–5.45)
RDW: 16.2 % — ABNORMAL HIGH (ref 11.2–14.5)
WBC: 3.3 10*3/uL — AB (ref 3.9–10.3)

## 2014-10-08 LAB — COMPREHENSIVE METABOLIC PANEL (CC13)
ALT: 26 U/L (ref 0–55)
ANION GAP: 11 meq/L (ref 3–11)
AST: 22 U/L (ref 5–34)
Albumin: 3.9 g/dL (ref 3.5–5.0)
Alkaline Phosphatase: 132 U/L (ref 40–150)
BUN: 9.8 mg/dL (ref 7.0–26.0)
CALCIUM: 9.6 mg/dL (ref 8.4–10.4)
CHLORIDE: 106 meq/L (ref 98–109)
CO2: 26 meq/L (ref 22–29)
Creatinine: 0.7 mg/dL (ref 0.6–1.1)
EGFR: >90 mL/min/{1.73_m2} (ref >90)
Glucose: 99 mg/dl (ref 70–140)
Potassium: 3.5 mEq/L (ref 3.5–5.1)
SODIUM: 143 meq/L (ref 136–145)
Total Bilirubin: 0.24 mg/dL (ref 0.20–1.20)
Total Protein: 6.8 g/dL (ref 6.4–8.3)

## 2014-10-08 MED ORDER — LORAZEPAM 0.5 MG PO TABS: 0.5000 mg | ORAL_TABLET | Freq: Every day | ORAL | Status: DC

## 2014-10-08 MED ORDER — PACLITAXEL PROTEIN-BOUND CHEMO INJECTION 100 MG
60.0000 mg/m2 | Freq: Once | INTRAVENOUS | Status: AC
  Administered 2014-10-08: 100 mg via INTRAVENOUS
  Filled 2014-10-08: qty 20

## 2014-10-08 MED ORDER — SODIUM CHLORIDE 0.9 % IJ SOLN
10.0000 mL | INTRAMUSCULAR | Status: DC | PRN
Start: 2014-10-08 — End: 2014-10-08
  Administered 2014-10-08: 10 mL
  Filled 2014-10-08: qty 10

## 2014-10-08 MED ORDER — SODIUM CHLORIDE 0.9 % IV SOLN
Freq: Once | INTRAVENOUS | Status: AC
  Administered 2014-10-08: 10:00:00 via INTRAVENOUS
  Filled 2014-10-08: qty 4

## 2014-10-08 MED ORDER — HEPARIN SOD (PORK) LOCK FLUSH 100 UNIT/ML IV SOLN
500.0000 [IU] | Freq: Once | INTRAVENOUS | Status: AC | PRN
  Administered 2014-10-08: 500 [IU]
  Filled 2014-10-08: qty 5

## 2014-10-08 MED ORDER — SODIUM CHLORIDE 0.9 % IV SOLN
Freq: Once | INTRAVENOUS | Status: AC
  Administered 2014-10-08: 10:00:00 via INTRAVENOUS

## 2014-10-08 NOTE — Progress Notes (Signed)
Patient Care Team: [name], MD as PCP - General (Internal Medicine) [name], MD as Consulting Physician (General Surgery) [name], MD as Consulting Physician (Hematology and Oncology) [name], MD as Consulting Physician (Radiation Oncology)  DIAGNOSIS: Breast cancer of upper-outer quadrant of right female breast   Staging form: Breast, AJCC 7th Edition     Clinical: Stage IA (T1b, N0, cM0) - Signed by Rulon Eisenmenger, MD on 03/20/2014     Pathologic: Stage IIB (T2, N1, cM0) - Unsigned   SUMMARY OF ONCOLOGIC HISTORY:   Breast cancer of upper-outer quadrant of right female breast   03/12/2014 Initial Biopsy Invasive ductal carcinoma with DCIS, right axillary lymph node biopsy negative, ER percent, PR 84%, Ki-67 16%, HER-2 negative   03/20/2014 Breast MRI Right breast upper outer quadrant 2.6 x 1.9 x 1.7 cm invasive ductal carcinoma with extension and involvement of the adjacent skin and nipple area complex, no abnormal lymph nodes   04/20/2014 Surgery Right breast lumpectomy: IDC grade 2; 2.5 cm with intermediate to high-grade DCIS margins negative: 1/3 positive sentinel lymph node with extracapsular extension, ER 100%, PR 84%, HER-2 negative ratio 1.22, Ki-67 16% T2, N1, M0 stage II B   06/04/2014 -  Chemotherapy Adjuvant chemotherapy dose dense Adriamycin and Cytoxan 4 followed by Abraxane 12    CHIEF COMPLIANT: week 10/12 Abraxane  INTERVAL HISTORY: Mckenzie Sanford is a 59 year old with above-mentioned history of right-sided breast cancer who had lumpectomy and is now on adjuvant chemotherapy. Today is week 10/12 Abraxane. She is tolerating the treatment extremely well without any major problems.she had one day of hand numbness but otherwise no neuropathy. Her nose is runny and has some allergies. Denies any fevers or chills.  REVIEW OF SYSTEMS:   Constitutional: Denies fevers, chills or abnormal weight loss Eyes: Denies blurriness of vision Ears, nose,  mouth, throat, and face: Denies mucositis or sore throat Respiratory: Denies cough, dyspnea or wheezes Cardiovascular: Denies palpitation, chest discomfort or lower extremity swelling Gastrointestinal:  Denies nausea, heartburn or change in bowel habits Skin: Denies abnormal skin rashes Lymphatics: Denies new lymphadenopathy or easy bruising Neurological:Denies numbness, tingling or new weaknesses Behavioral/Psych: Mood is stable, no new changes , difficulty falling asleep. All other systems were reviewed with the patient and are negative.  I have reviewed the past medical history, past surgical history, social history and family history with the patient and they are unchanged from previous note.  ALLERGIES:  is allergic to shellfish allergy; ivp dye; and penicillins.  MEDICATIONS:  Current Outpatient Prescriptions  Medication Sig Dispense Refill  . acetaminophen (TYLENOL) 325 MG tablet Take 325 mg by mouth 1 day or 1 dose.    Marland Kitchen amLODipine (NORVASC) 10 MG tablet Take 10 mg by mouth daily.    Marland Kitchen HYDROcodone-acetaminophen (NORCO/VICODIN) 5-325 MG per tablet Take 1-2 tablets by mouth every 4 (four) hours as needed for moderate pain or severe pain. 30 tablet 0  . irbesartan (AVAPRO) 150 MG tablet     . lidocaine-prilocaine (EMLA) cream   3  . LINZESS 290 MCG CAPS capsule Take 290 mcg by mouth daily as needed.  11  . LORazepam (ATIVAN) 0.5 MG tablet   0  . metoprolol succinate (TOPROL-XL) 50 MG 24 hr tablet Take 50 mg by mouth daily. Take with or immediately following a meal.    . sertraline (ZOLOFT) 25 MG tablet at bedtime.   11  . simvastatin (ZOCOR) 40 MG tablet Take 40 mg by mouth every evening.  No current facility-administered medications for this visit.    PHYSICAL EXAMINATION: ECOG PERFORMANCE STATUS: 1 - Symptomatic but completely ambulatory  Filed Vitals:   10/08/14 0831  BP: 136/73  Pulse: 69  Temp: 98 F (36.7 C)  Resp: 18   Filed Weights   10/08/14 0831  Weight:  126 lb 14.4 oz (57.561 kg)    GENERAL:alert, no distress and comfortable SKIN: skin color, texture, turgor are normal, no rashes or significant lesions EYES: normal, Conjunctiva are pink and non-injected, sclera clear OROPHARYNX:no exudate, no erythema and lips, buccal mucosa, and tongue normal  NECK: supple, thyroid normal size, non-tender, without nodularity LYMPH:  no palpable lymphadenopathy in the cervical, axillary or inguinal LUNGS: clear to auscultation and percussion with normal breathing effort HEART: regular rate & rhythm and no murmurs and no lower extremity edema ABDOMEN:abdomen soft, non-tender and normal bowel sounds Musculoskeletal:no cyanosis of digits and no clubbing  NEURO: alert & oriented x 3 with fluent speech, no focal motor/sensory deficits  LABORATORY DATA:  I have reviewed the data as listed   Chemistry      Component Value Date/Time   NA 142 10/01/2014 1340   NA 140 09/10/2014 0909   K 3.5 10/01/2014 1340   K 3.6 09/10/2014 0909   CL 105 09/10/2014 0909   CO2 22 10/01/2014 1340   CO2 28 09/10/2014 0909   BUN 11.5 10/01/2014 1340   BUN 7 09/10/2014 0909   CREATININE 0.7 10/01/2014 1340   CREATININE 0.65 09/10/2014 0909   CREATININE 0.72 12/11/2011 1238      Component Value Date/Time   CALCIUM 9.6 10/01/2014 1340   CALCIUM 10.1 09/10/2014 0909   ALKPHOS 128 10/01/2014 1340   ALKPHOS 142* 09/10/2014 0909   AST 20 10/01/2014 1340   AST 31 09/10/2014 0909   ALT 27 10/01/2014 1340   ALT 41* 09/10/2014 0909   BILITOT <0.20 10/01/2014 1340   BILITOT 0.3 09/10/2014 0909       Lab Results  Component Value Date   WBC 3.3* 10/08/2014   HGB 12.7 10/08/2014   HCT 37.4 10/08/2014   MCV 90.5 10/08/2014   PLT 366 10/08/2014   NEUTROABS 2.0 10/08/2014   ASSESSMENT & PLAN:  Breast cancer of upper-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 Elevated AST and ALT: normalized and did not require dose adjustments. 2. Anxiety and jitteriness: We decreased the dosage of Decadron from cycle 2.I encouraged her to take Ativan at bedtime to sleep. 3. Alopecia 4. Sialitis 5. Chemotherapy-induced anemia grade 1 6. Fatigue: For 4-5 days after chemotherapy 7. 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 8. Diarrhea with dehydration did not respond to Imodium required IV fluids after cycle 3 9. Rash on her shoulders and chest with mild pruritus: Treated with Benadryl and Pepcid 10 Neutropenia after fourth cycle of Adriamycin required holding treatment for a week and neupogen. Patient's dose of Abraxane reduced to 65 mg/m with week 3. 11. Depressive symptoms:Seeing a psychiatrist.  Return to clinic in 2 weeks for cycle 12 which should be the last cycle of chemotherapy  Plan:  1. Adjuvant radiation therapy: We will arrange a consultation with radiation oncology 2. 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 I instructed her to use a mask as needed. I renewed her prescription for Ativan today.   No orders of the defined types were placed in this encounter.   The patient has a good understanding of the overall plan. she agrees with it. she will call with any problems that may develop before the next visit here.   Rulon Eisenmenger, MD

## 2014-10-08 NOTE — Patient Instructions (Signed)
[facility] Discharge Instructions for Patients Receiving Chemotherapy  Today you received the following chemotherapy agents Abraxane  To help prevent nausea and vomiting after your treatment, we encourage you to take your nausea medication    If you develop nausea and vomiting that is not controlled by your nausea medication, call the clinic.   BELOW ARE SYMPTOMS THAT SHOULD BE REPORTED IMMEDIATELY:  *FEVER GREATER THAN 100.5 F  *CHILLS WITH OR WITHOUT FEVER  NAUSEA AND VOMITING THAT IS NOT CONTROLLED WITH YOUR NAUSEA MEDICATION  *UNUSUAL SHORTNESS OF BREATH  *UNUSUAL BRUISING OR BLEEDING  TENDERNESS IN MOUTH AND THROAT WITH OR WITHOUT PRESENCE OF ULCERS  *URINARY PROBLEMS  *BOWEL PROBLEMS  UNUSUAL RASH Items with * indicate a potential emergency and should be followed up as soon as possible.  Feel free to call the clinic you have any questions or concerns. The clinic phone number is [phone].  Please show the CHEMO ALERT CARD at check-in to the Emergency Department and triage nurse.   

## 2014-10-08 NOTE — Telephone Encounter (Signed)
Appointments made and patient will get a new schedule in chemo

## 2014-10-15 ENCOUNTER — Other Ambulatory Visit (HOSPITAL_BASED_OUTPATIENT_CLINIC_OR_DEPARTMENT_OTHER): Payer: 59

## 2014-10-15 ENCOUNTER — Ambulatory Visit (HOSPITAL_BASED_OUTPATIENT_CLINIC_OR_DEPARTMENT_OTHER): Payer: 59

## 2014-10-15 VITALS — BP 124/74 | HR 63 | Temp 98.5°F | Resp 18

## 2014-10-15 DIAGNOSIS — C50411 Malignant neoplasm of upper-outer quadrant of right female breast: Secondary | ICD-10-CM | POA: Diagnosis not present

## 2014-10-15 DIAGNOSIS — C773 Secondary and unspecified malignant neoplasm of axilla and upper limb lymph nodes: Secondary | ICD-10-CM | POA: Diagnosis not present

## 2014-10-15 DIAGNOSIS — Z5111 Encounter for antineoplastic chemotherapy: Secondary | ICD-10-CM

## 2014-10-15 LAB — CBC WITH DIFFERENTIAL/PLATELET
BASO%: 2.6 % — ABNORMAL HIGH (ref 0.0–2.0)
Basophils Absolute: 0.1 10*3/uL (ref 0.0–0.1)
EOS ABS: 0.1 10*3/uL (ref 0.0–0.5)
EOS%: 2.5 % (ref 0.0–7.0)
HCT: 37.7 % (ref 34.8–46.6)
HGB: 12.7 g/dL (ref 11.6–15.9)
LYMPH#: 0.9 10*3/uL (ref 0.9–3.3)
LYMPH%: 26 % (ref 14.0–49.7)
MCH: 30.6 pg (ref 25.1–34.0)
MCHC: 33.6 g/dL (ref 31.5–36.0)
MCV: 91 fL (ref 79.5–101.0)
MONO#: 0.4 10*3/uL (ref 0.1–0.9)
MONO%: 11.1 % (ref 0.0–14.0)
NEUT%: 57.8 % (ref 38.4–76.8)
NEUTROS ABS: 2 10*3/uL (ref 1.5–6.5)
Platelets: 342 10*3/uL (ref 145–400)
RBC: 4.14 10*6/uL (ref 3.70–5.45)
RDW: 15.4 % — AB (ref 11.2–14.5)
WBC: 3.4 10*3/uL — ABNORMAL LOW (ref 3.9–10.3)

## 2014-10-15 LAB — COMPREHENSIVE METABOLIC PANEL (CC13)
ALT: 32 U/L (ref 0–55)
ANION GAP: 10 meq/L (ref 3–11)
AST: 20 U/L (ref 5–34)
Albumin: 3.9 g/dL (ref 3.5–5.0)
Alkaline Phosphatase: 131 U/L (ref 40–150)
BUN: 11.5 mg/dL (ref 7.0–26.0)
CHLORIDE: 106 meq/L (ref 98–109)
CO2: 25 mEq/L (ref 22–29)
Calcium: 9.6 mg/dL (ref 8.4–10.4)
Creatinine: 0.7 mg/dL (ref 0.6–1.1)
EGFR: >90 mL/min/{1.73_m2} (ref >90)
Glucose: 99 mg/dl (ref 70–140)
Potassium: 3.5 mEq/L (ref 3.5–5.1)
Sodium: 141 mEq/L (ref 136–145)
Total Bilirubin: 0.2 mg/dL (ref 0.20–1.20)
Total Protein: 6.8 g/dL (ref 6.4–8.3)

## 2014-10-15 MED ORDER — ONDANSETRON HCL 40 MG/20ML IJ SOLN
Freq: Once | INTRAMUSCULAR | Status: AC
  Administered 2014-10-15: 09:00:00 via INTRAVENOUS
  Filled 2014-10-15: qty 4

## 2014-10-15 MED ORDER — SODIUM CHLORIDE 0.9 % IJ SOLN
10.0000 mL | INTRAMUSCULAR | Status: DC | PRN
  Administered 2014-10-15: 10 mL
  Filled 2014-10-15: qty 10

## 2014-10-15 MED ORDER — HEPARIN SOD (PORK) LOCK FLUSH 100 UNIT/ML IV SOLN
500.0000 [IU] | Freq: Once | INTRAVENOUS | Status: AC | PRN
  Administered 2014-10-15: 500 [IU]
  Filled 2014-10-15: qty 5

## 2014-10-15 MED ORDER — SODIUM CHLORIDE 0.9 % IV SOLN
Freq: Once | INTRAVENOUS | Status: AC
  Administered 2014-10-15: 09:00:00 via INTRAVENOUS

## 2014-10-15 MED ORDER — PACLITAXEL PROTEIN-BOUND CHEMO INJECTION 100 MG
60.0000 mg/m2 | Freq: Once | INTRAVENOUS | Status: AC
  Administered 2014-10-15: 100 mg via INTRAVENOUS
  Filled 2014-10-15: qty 20

## 2014-10-15 NOTE — Patient Instructions (Signed)
[facility] Discharge Instructions for Patients Receiving Chemotherapy  Today you received the following chemotherapy agents: Abraxane   To help prevent nausea and vomiting after your treatment, we encourage you to take your nausea medication as directed.    If you develop nausea and vomiting that is not controlled by your nausea medication, call the clinic.   BELOW ARE SYMPTOMS THAT SHOULD BE REPORTED IMMEDIATELY:  *FEVER GREATER THAN 100.5 F  *CHILLS WITH OR WITHOUT FEVER  NAUSEA AND VOMITING THAT IS NOT CONTROLLED WITH YOUR NAUSEA MEDICATION  *UNUSUAL SHORTNESS OF BREATH  *UNUSUAL BRUISING OR BLEEDING  TENDERNESS IN MOUTH AND THROAT WITH OR WITHOUT PRESENCE OF ULCERS  *URINARY PROBLEMS  *BOWEL PROBLEMS  UNUSUAL RASH Items with * indicate a potential emergency and should be followed up as soon as possible.  Feel free to call the clinic you have any questions or concerns. The clinic phone number is [phone].  Please show the CHEMO ALERT CARD at check-in to the Emergency Department and triage nurse.   

## 2014-10-22 ENCOUNTER — Ambulatory Visit (HOSPITAL_BASED_OUTPATIENT_CLINIC_OR_DEPARTMENT_OTHER): Payer: 59

## 2014-10-22 ENCOUNTER — Ambulatory Visit (HOSPITAL_BASED_OUTPATIENT_CLINIC_OR_DEPARTMENT_OTHER): Payer: 59 | Admitting: Hematology and Oncology

## 2014-10-22 ENCOUNTER — Telehealth: Payer: Self-pay | Admitting: Hematology and Oncology

## 2014-10-22 ENCOUNTER — Encounter: Payer: Self-pay | Admitting: *Deleted

## 2014-10-22 ENCOUNTER — Other Ambulatory Visit (HOSPITAL_BASED_OUTPATIENT_CLINIC_OR_DEPARTMENT_OTHER): Payer: 59

## 2014-10-22 VITALS — BP 126/77 | HR 70 | Temp 98.9°F | Resp 18 | Ht 62.0 in | Wt 126.0 lb

## 2014-10-22 DIAGNOSIS — C50411 Malignant neoplasm of upper-outer quadrant of right female breast: Secondary | ICD-10-CM | POA: Diagnosis not present

## 2014-10-22 DIAGNOSIS — C773 Secondary and unspecified malignant neoplasm of axilla and upper limb lymph nodes: Secondary | ICD-10-CM

## 2014-10-22 DIAGNOSIS — Z5111 Encounter for antineoplastic chemotherapy: Secondary | ICD-10-CM

## 2014-10-22 DIAGNOSIS — G629 Polyneuropathy, unspecified: Secondary | ICD-10-CM

## 2014-10-22 DIAGNOSIS — R5383 Other fatigue: Secondary | ICD-10-CM

## 2014-10-22 LAB — CBC WITH DIFFERENTIAL/PLATELET
BASO%: 1.8 % (ref 0.0–2.0)
BASOS ABS: 0.1 10*3/uL (ref 0.0–0.1)
EOS%: 3.1 % (ref 0.0–7.0)
Eosinophils Absolute: 0.1 10*3/uL (ref 0.0–0.5)
HEMATOCRIT: 38.8 % (ref 34.8–46.6)
HGB: 12.9 g/dL (ref 11.6–15.9)
LYMPH%: 26.8 % (ref 14.0–49.7)
MCH: 30.1 pg (ref 25.1–34.0)
MCHC: 33.2 g/dL (ref 31.5–36.0)
MCV: 90.7 fL (ref 79.5–101.0)
MONO#: 0.3 10*3/uL (ref 0.1–0.9)
MONO%: 8.6 % (ref 0.0–14.0)
NEUT#: 1.9 10*3/uL (ref 1.5–6.5)
NEUT%: 59.7 % (ref 38.4–76.8)
Platelets: 306 10*3/uL (ref 145–400)
RBC: 4.28 10*6/uL (ref 3.70–5.45)
RDW: 14.1 % (ref 11.2–14.5)
WBC: 3.3 10*3/uL — AB (ref 3.9–10.3)
lymph#: 0.9 10*3/uL (ref 0.9–3.3)

## 2014-10-22 LAB — COMPREHENSIVE METABOLIC PANEL (CC13)
ALK PHOS: 122 U/L (ref 40–150)
ALT: 25 U/L (ref 0–55)
ANION GAP: 10 meq/L (ref 3–11)
AST: 20 U/L (ref 5–34)
Albumin: 4 g/dL (ref 3.5–5.0)
BUN: 14.4 mg/dL (ref 7.0–26.0)
CO2: 26 mEq/L (ref 22–29)
CREATININE: 0.7 mg/dL (ref 0.6–1.1)
Calcium: 9.3 mg/dL (ref 8.4–10.4)
Chloride: 106 mEq/L (ref 98–109)
EGFR: >90 mL/min/{1.73_m2} (ref >90)
Glucose: 107 mg/dl (ref 70–140)
POTASSIUM: 3.4 meq/L — AB (ref 3.5–5.1)
SODIUM: 142 meq/L (ref 136–145)
Total Bilirubin: 0.24 mg/dL (ref 0.20–1.20)
Total Protein: 6.9 g/dL (ref 6.4–8.3)

## 2014-10-22 MED ORDER — SODIUM CHLORIDE 0.9 % IV SOLN
Freq: Once | INTRAVENOUS | Status: AC
  Administered 2014-10-22: 09:00:00 via INTRAVENOUS

## 2014-10-22 MED ORDER — SODIUM CHLORIDE 0.9 % IV SOLN
Freq: Once | INTRAVENOUS | Status: AC
  Administered 2014-10-22: 10:00:00 via INTRAVENOUS
  Filled 2014-10-22: qty 4

## 2014-10-22 MED ORDER — HEPARIN SOD (PORK) LOCK FLUSH 100 UNIT/ML IV SOLN
500.0000 [IU] | Freq: Once | INTRAVENOUS | Status: AC | PRN
  Administered 2014-10-22: 500 [IU]
  Filled 2014-10-22: qty 5

## 2014-10-22 MED ORDER — SODIUM CHLORIDE 0.9 % IJ SOLN
10.0000 mL | INTRAMUSCULAR | Status: DC | PRN
  Administered 2014-10-22: 10 mL
  Filled 2014-10-22: qty 10

## 2014-10-22 MED ORDER — PACLITAXEL PROTEIN-BOUND CHEMO INJECTION 100 MG
60.0000 mg/m2 | Freq: Once | INTRAVENOUS | Status: AC
  Administered 2014-10-22: 100 mg via INTRAVENOUS
  Filled 2014-10-22: qty 20

## 2014-10-22 NOTE — Progress Notes (Signed)
Patient Care Team: [name], MD as PCP - General (Internal Medicine) [name], MD as Consulting Physician (General Surgery) [name], MD as Consulting Physician (Hematology and Oncology) [name], MD as Consulting Physician (Radiation Oncology)  DIAGNOSIS: Breast cancer of upper-outer quadrant of right female breast   Staging form: Breast, AJCC 7th Edition     Clinical: Stage IA (T1b, N0, cM0) - Signed by Rulon Eisenmenger, MD on 03/20/2014     Pathologic: Stage IIB (T2, N1, cM0) - Unsigned   SUMMARY OF ONCOLOGIC HISTORY:   Breast cancer of upper-outer quadrant of right female breast   03/12/2014 Initial Biopsy Invasive ductal carcinoma with DCIS, right axillary lymph node biopsy negative, ER percent, PR 84%, Ki-67 16%, HER-2 negative   03/20/2014 Breast MRI Right breast upper outer quadrant 2.6 x 1.9 x 1.7 cm invasive ductal carcinoma with extension and involvement of the adjacent skin and nipple area complex, no abnormal lymph nodes   04/20/2014 Surgery Right breast lumpectomy: IDC grade 2; 2.5 cm with intermediate to high-grade DCIS margins negative: 1/3 positive sentinel lymph node with extracapsular extension, ER 100%, PR 84%, HER-2 negative ratio 1.22, Ki-67 16% T2, N1, M0 stage II B   06/04/2014 -  Chemotherapy Adjuvant chemotherapy dose dense Adriamycin and Cytoxan 4 followed by Abraxane 12    CHIEF COMPLIANT: Cycle 12/12 Abraxane last cycle of chemotherapy  INTERVAL HISTORY: Mckenzie Sanford is a 59 year old with above-mentioned history of right breast cancer currently on adjuvant chemotherapy and today is her last treatment. She is super excited and happy about it. She did have some throbbing discomfort in the left side of the body but otherwise it has resolved. She has mild neuropathy. Nail changes as well.  REVIEW OF SYSTEMS:   Constitutional: Denies fevers, chills or abnormal weight loss Eyes: Denies blurriness of vision Ears, nose, mouth, throat, and  face: Denies mucositis or sore throat Respiratory: Denies cough, dyspnea or wheezes Cardiovascular: Denies palpitation, chest discomfort or lower extremity swelling Gastrointestinal:  Denies nausea, heartburn or change in bowel habits Skin: Denies abnormal skin rashes Lymphatics: Denies new lymphadenopathy or easy bruising Neurological: Grade 1 neuropathy Behavioral/Psych: Mood is stable, no new changes  Breast:  denies any pain or lumps or nodules in either breasts All other systems were reviewed with the patient and are negative.  I have reviewed the past medical history, past surgical history, social history and family history with the patient and they are unchanged from previous note.  ALLERGIES:  is allergic to shellfish allergy; ivp dye; and penicillins.  MEDICATIONS:  Current Outpatient Prescriptions  Medication Sig Dispense Refill  . acetaminophen (TYLENOL) 325 MG tablet Take 325 mg by mouth 1 day or 1 dose.    Marland Kitchen amLODipine (NORVASC) 10 MG tablet Take 10 mg by mouth daily.    Marland Kitchen HYDROcodone-acetaminophen (NORCO/VICODIN) 5-325 MG per tablet Take 1-2 tablets by mouth every 4 (four) hours as needed for moderate pain or severe pain. 30 tablet 0  . irbesartan (AVAPRO) 150 MG tablet     . lidocaine-prilocaine (EMLA) cream   3  . LINZESS 290 MCG CAPS capsule Take 290 mcg by mouth daily as needed.  11  . LORazepam (ATIVAN) 0.5 MG tablet Take 1 tablet (0.5 mg total) by mouth at bedtime. 30 tablet 0  . metoprolol succinate (TOPROL-XL) 50 MG 24 hr tablet Take 50 mg by mouth daily. Take with or immediately following a meal.    . sertraline (ZOLOFT) 25 MG tablet at bedtime.  11  . simvastatin (ZOCOR) 40 MG tablet Take 40 mg by mouth every evening.     No current facility-administered medications for this visit.    PHYSICAL EXAMINATION: ECOG PERFORMANCE STATUS: 1 - Symptomatic but completely ambulatory  Filed Vitals:   10/22/14 0838  BP: 126/77  Pulse: 70  Temp: 98.9 F (37.2 C)   Resp: 18   Filed Weights   10/22/14 0838  Weight: 126 lb (57.153 kg)    GENERAL:alert, no distress and comfortable SKIN: skin color, texture, turgor are normal, no rashes or significant lesions EYES: normal, Conjunctiva are pink and non-injected, sclera clear OROPHARYNX:no exudate, no erythema and lips, buccal mucosa, and tongue normal  NECK: supple, thyroid normal size, non-tender, without nodularity LYMPH:  no palpable lymphadenopathy in the cervical, axillary or inguinal LUNGS: clear to auscultation and percussion with normal breathing effort HEART: regular rate & rhythm and no murmurs and no lower extremity edema ABDOMEN:abdomen soft, non-tender and normal bowel sounds Musculoskeletal:no cyanosis of digits and no clubbing  NEURO: alert & oriented x 3 with fluent speech, no focal motor/sensory deficits  LABORATORY DATA:  I have reviewed the data as listed   Chemistry      Component Value Date/Time   NA 141 10/15/2014 0821   NA 140 09/10/2014 0909   K 3.5 10/15/2014 0821   K 3.6 09/10/2014 0909   CL 105 09/10/2014 0909   CO2 25 10/15/2014 0821   CO2 28 09/10/2014 0909   BUN 11.5 10/15/2014 0821   BUN 7 09/10/2014 0909   CREATININE 0.7 10/15/2014 0821   CREATININE 0.65 09/10/2014 0909   CREATININE 0.72 12/11/2011 1238      Component Value Date/Time   CALCIUM 9.6 10/15/2014 0821   CALCIUM 10.1 09/10/2014 0909   ALKPHOS 131 10/15/2014 0821   ALKPHOS 142* 09/10/2014 0909   AST 20 10/15/2014 0821   AST 31 09/10/2014 0909   ALT 32 10/15/2014 0821   ALT 41* 09/10/2014 0909   BILITOT 0.20 10/15/2014 0821   BILITOT 0.3 09/10/2014 0909       Lab Results  Component Value Date   WBC 3.3* 10/22/2014   HGB 12.9 10/22/2014   HCT 38.8 10/22/2014   MCV 90.7 10/22/2014   PLT 306 10/22/2014   NEUTROABS 1.9 10/22/2014    ASSESSMENT & PLAN:  Breast cancer of upper-outer quadrant of right female breast Left breast invasive ductal carcinoma 5.2 cm grade 2 one out of 5  lymph nodes positive with diffuse lymphovascular invasion noted a Ki-67 of 57% ER 100%, PR 0%, HER-2 negative T3, N1, M0 stage IIIa BRCA1 positive  Current treatment: Dose dense Adriamycin and Cytoxan 4 followed by Abraxane 12  Today is cycle 12/12 of weekly Abraxane  Toxicities of chemotherapy: 1. Elevated AST and ALT: Resolved. 2. Anxiety and jitteriness: We decreased the dosage of Decadron from cycle 2.I encouraged her to take Ativan at bedtime to sleep. 3. Alopecia 4. Sialitis: Resolved 5. Chemotherapy-induced anemia grade 1: Resolved 6. Fatigue: For 4-5 days after chemotherapy 7. 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 8. Diarrhea with dehydration did not respond to Imodium required IV fluids after cycle 3 9. Rash on her shoulders and chest with mild pruritus: Treated with Benadryl and Pepcid 10 Neutropenia after fourth cycle of Adriamycin required holding treatment for a week and neupogen. Patient's dose of Abraxane reduced to 65 mg/m with week 3. 11. Depressive symptoms:Seeing a psychiatrist. 12. Itching sensation throughout her body:  Instructed her to take Benadryl  Plan: 1. Adjuvant radiation therapy: Has appointment with radiation oncology 2. Followed by antiestrogen therapy She would be eligible for tamoxifen since she is menopausal as well as going on [facility] clinical trial  Return to clinic in 3 months start adjuvant antiestrogen therapy.  No orders of the defined types were placed in this encounter.   The patient has a good understanding of the overall plan. she agrees with it. she will call with any problems that may develop before the next visit here.   Rulon Eisenmenger, MD

## 2014-10-22 NOTE — Patient Instructions (Signed)
[facility] Discharge Instructions for Patients Receiving Chemotherapy  Today you received the following chemotherapy agents Abraxane To help prevent nausea and vomiting after your treatment, we encourage you to take your nausea medication as prescribed.   If you develop nausea and vomiting that is not controlled by your nausea medication, call the clinic.   BELOW ARE SYMPTOMS THAT SHOULD BE REPORTED IMMEDIATELY:  *FEVER GREATER THAN 100.5 F  *CHILLS WITH OR WITHOUT FEVER  NAUSEA AND VOMITING THAT IS NOT CONTROLLED WITH YOUR NAUSEA MEDICATION  *UNUSUAL SHORTNESS OF BREATH  *UNUSUAL BRUISING OR BLEEDING  TENDERNESS IN MOUTH AND THROAT WITH OR WITHOUT PRESENCE OF ULCERS  *URINARY PROBLEMS  *BOWEL PROBLEMS  UNUSUAL RASH Items with * indicate a potential emergency and should be followed up as soon as possible.  Feel free to call the clinic you have any questions or concerns. The clinic phone number is [phone].  Please show the CHEMO ALERT CARD at check-in to the Emergency Department and triage nurse.   

## 2014-10-22 NOTE — Progress Notes (Signed)
Met with pt during final chemo. Relate she is doing well. Denies needs or concerns at this time. Encourage pt to call with questions.

## 2014-10-22 NOTE — Telephone Encounter (Signed)
Appointments made and avs printed for patient °

## 2014-10-22 NOTE — Assessment & Plan Note (Signed)
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 Elevated AST and ALT: normalized and did not require dose adjustments. 2. Anxiety and jitteriness: We decreased the dosage of Decadron from cycle 2.I encouraged her to take Ativan at bedtime to sleep. 3. Alopecia 4. Sialitis 5. Chemotherapy-induced anemia grade 1 6. Fatigue: For 4-5 days after chemotherapy 7. 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 8. Diarrhea with dehydration did not respond to Imodium required IV fluids after cycle 3 9. Rash on her shoulders and chest with mild pruritus: Treated with Benadryl and Pepcid 10 Neutropenia after fourth cycle of Adriamycin required holding treatment for a week and neupogen. Patient's dose of Abraxane reduced to 65 mg/m with week 3. 11. Depressive symptoms:Seeing a psychiatrist.  Plan: 1. Adjuvant radiation therapy: Has appointment with radiation oncology 2. Followed by antiestrogen therapy  Return to clinic in 3 months start adjuvant antiestrogen therapy.

## 2014-10-27 NOTE — Progress Notes (Signed)
Location of Breast Cancer:Invasive ductal carcinoma with DCIS.  Histology per Pathology Report:03/12/2014 INAL DIAGNOSIS Diagnosis 1. Breast, right, needle core biopsy, mass - INVASIVE DUCTAL CARCINOMA. - DUCTAL CARCINOMA IN SITU. - SEE COMMENT. 2. Lymph node, needle/core biopsy, right axilla THERE IS NO EVIDENCE OF CARCINOMA IN 1 OF 1 LYMPH NODE (0/1).   Receptor Status: ER(+), PR (+), Her2-neu (-)  Did patient present with symptoms (if so, please note symptoms) or was this found on screening mammography?:Palpated by patient. Past/Anticipated interventions by surgeon, if any:04/20/14;Right breast lumpectomy  Past/Anticipated interventions by medical oncology, if any: Chemotherapy :Adriamycin and Cytoxan x 4 Abraxane 12/12.Completed chemotherapy on 10/22/14.  Lymphedema issues, if any:No  Pain issues, if any:No   SAFETY ISSUES:  Prior radiation? No  Pacemaker/ICD?No  Possible current pregnancy?No. Last menstrual cycle 2 years ago age 59..  Is the patient on methotrexate?No  Current Complaints / other details:Divorced schoolteacher.Menarche  Age 22.No children  Allergies:shellfish, ivp dye and penicillin    Arlyss Repress, RN 10/27/2014,5:15 PM

## 2014-10-28 ENCOUNTER — Ambulatory Visit
Admission: RE | Admit: 2014-10-28 | Discharge: 2014-10-28 | Disposition: A | Discharge status: Home / Self Care | Payer: 59 | Source: Ambulatory Visit | Attending: Radiation Oncology | Admitting: Radiation Oncology

## 2014-10-28 ENCOUNTER — Encounter: Payer: Self-pay | Admitting: Radiation Oncology

## 2014-10-28 VITALS — BP 130/75 | HR 62 | Temp 98.5°F | Wt 126.8 lb

## 2014-10-28 DIAGNOSIS — C50411 Malignant neoplasm of upper-outer quadrant of right female breast: Secondary | ICD-10-CM

## 2014-10-28 NOTE — Addendum Note (Signed)
Encounter addended by: Norm Salt, RN on: 10/28/2014 11:00 AM<BR>     Documentation filed: Charges VN

## 2014-10-28 NOTE — Progress Notes (Signed)
Please see the Nurse Progress Note in the MD Initial Consult Encounter for this patient. 

## 2014-10-28 NOTE — Progress Notes (Signed)
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 female who felt an abnormality in her right breast. A mammogram was performed which showed a 1 cm legion 1 o'clock position. Ultrasound guided biopsy which showed invasive ductal carcinoma was ER, PR positive and HER-2 negative. A lymph node was also biopsied and was benign. MRI of bilateral breast showed a 2.6 x 1.9 x 1.7 cm mass with extension and involvement of the adjacent skin and nipple. She underwent a lumpectomy on 04/21/15 which showed a 2.5 cm invasive ductal carcinoma with negative margins and 1 out of 3 lymph nodes was positve for metastatic carcinoma with extra capsular extension. Received ACT chemotherapy and finished that on 10/22/14. She is now referred for adjuvant radiation. Her chemotherapy was complicated by fatigue, neuropathy and diarrhea. She had her first menses at age 67, has had no pregnancies and underwent menopause approximately 5 years ago. She is here with her sister. She is going back to work half days on Monday. She is excited to be done with chemotherapy.   PREVIOUS RADIATION THERAPY: No  Past medical, social and family history were reviewed in the electronic chart. Review of symptoms was reviewed in the electronic chart. Medications were reviewed in the electronic chart.   PHYSICAL EXAM:  Filed Vitals:   10/28/14 0932  BP: 130/75  Pulse: 62  Temp: 98.5 F (36.9 C)  .126 lb  12.8 oz (57.516 kg). Pleasant female in no distress. Alert and oriented.   IMPRESSION: T2N1 Invasive Ductal Carcinoma of Right Breast  PLAN:I spoke to the patient today regarding her diagnosis and options for treatment. We discussed the equivalence in terms of survival and local failure between mastectomy and breast conservation. We discussed the role of radiation in decreasing local failures in patients who undergo lumpectomy. We discussed the process of simulation and the placement tattoos. We discussed 6 weeks of treatment as an outpatient. We discussed the possibility of asymptomatic lung damage. We discussed the low likelihood of secondary malignancies. We discussed the possible side effects including but not limited to skin redness, fatigue, permanent skin darkening, and breast swelling.   We will also be targeting her axilla due to her positive lymph node which was not followed by axillary node dissection.   She will be scheduled for simulation on 6/16 with plans to start after that. She requested afternoon treatment times in order to work in the mornings. She signed informed consent.    I spent 60 minutes  face to face with the patient and more than 50% of that time was spent in counseling and/or coordination of care.   ------------------------------------------------  [name], MD   This document serves as a record of services personally performed by [name], MD. It was created on her behalf by Derek Mound, a trained medical scribe. The creation of this record is based on the scribe's personal observations and the provider's statements to them.  This document has been checked and approved by the attending provider.

## 2014-11-12 ENCOUNTER — Ambulatory Visit
Admission: RE | Admit: 2014-11-12 | Discharge: 2014-11-12 | Disposition: A | Discharge status: Home / Self Care | Payer: 59 | Source: Ambulatory Visit | Attending: Radiation Oncology | Admitting: Radiation Oncology

## 2014-11-12 DIAGNOSIS — C50411 Malignant neoplasm of upper-outer quadrant of right female breast: Secondary | ICD-10-CM

## 2014-11-12 NOTE — Progress Notes (Signed)
Name: [name]   MRN: [id number]  Date:  11/12/2014  DOB: 02-14-1956  Status:outpatient    DIAGNOSIS: Breast cancer.  CONSENT VERIFIED: yes   SET UP: Patient is setup supine   IMMOBILIZATION:  The following immobilization was used:Custom Moldable Pillow, breast board.   NARRATIVE: Mckenzie Sanford was brought to the [facility].  Identity was confirmed.  All relevant records and images related to the planned course of therapy were reviewed.  Then, the patient was positioned in a stable reproducible clinical set-up for radiation therapy.  Wires were placed to delineate the clinical extent of breast tissue. A wire was placed on the scar as well.  CT images were obtained.  An isocenter was placed. Skin markings were placed.  The CT images were loaded into the planning software where the target and avoidance structures were contoured.  The radiation prescription was entered and confirmed. The patient was discharged in stable condition and tolerated simulation well.    TREATMENT PLANNING NOTE:  Treatment planning then occurred. I have requested : MLC's, isodose plan, basic dose calculation  I personally designed and supervised the construction of 5 medically necessary complex treatment devices for the protection of critical normal structures including the lungs and contralateral breast as well as the immobilization device which is necessary for set up certainty.   TREATMENT PLANNING NOTE/3D Simulation Note Treatment planning then occurred. I have requested : MLC's, isodose plan, basic dose calculation  3D simulation was performed.  I personally constructed 6 complex treatment devices in the form of MLCs which will be used for beam modification and to protect critical structures including the heart and lung.  I have requested a dose volume histogram of the heart lung and tumor cavity.  This document serves as a record of services personally performed by [name], MD.  It was created on her behalf by Darcus Austin, a trained medical scribe. The creation of this record is based on the scribe's personal observations and the provider's statements to them. This document has been checked and approved by the attending provider.

## 2014-11-12 NOTE — Progress Notes (Signed)
Radiation Oncology         [phone] ________________________________  Name: Mckenzie Sanford      MRN: [id number]          Date: 11/12/2014              DOB: 01-May-1956  Optical Surface Tracking Plan:  Since intensity modulated radiotherapy (IMRT) and 3D conformal radiation treatment methods are predicated on accurate and precise positioning for treatment, intrafraction motion monitoring is medically necessary to ensure accurate and safe treatment delivery.  The ability to quantify intrafraction motion without excessive ionizing radiation dose can only be performed with optical surface tracking. Accordingly, surface imaging offers the opportunity to obtain 3D measurements of patient position throughout IMRT and 3D treatments without excessive radiation exposure.  I am ordering optical surface tracking for this patient's upcoming course of radiotherapy. ________________________________ [name], MD   Reference:   Particia Jasper, et al. Surface imaging-based analysis of intrafraction motion for breast radiotherapy patients.Journal of [place], n. 6, nov. 2014. ISSN [id number].   Available at: <http://www.jacmp.org/index.php/jacmp/article/view/4957>.   This document serves as a record of services personally performed by [name], MD. It was created on her behalf by Darcus Austin, a trained medical scribe. The creation of this record is based on the scribe's personal observations and the provider's statements to them. This document has been checked and approved by the attending provider.

## 2014-11-17 DIAGNOSIS — C50411 Malignant neoplasm of upper-outer quadrant of right female breast: Secondary | ICD-10-CM | POA: Diagnosis not present

## 2014-11-19 ENCOUNTER — Ambulatory Visit
Admission: RE | Admit: 2014-11-19 | Discharge: 2014-11-19 | Disposition: A | Discharge status: Home / Self Care | Payer: 59 | Source: Ambulatory Visit | Attending: Radiation Oncology | Admitting: Radiation Oncology

## 2014-11-19 DIAGNOSIS — C50411 Malignant neoplasm of upper-outer quadrant of right female breast: Secondary | ICD-10-CM | POA: Diagnosis not present

## 2014-11-20 ENCOUNTER — Ambulatory Visit: Payer: 59

## 2014-11-23 ENCOUNTER — Ambulatory Visit
Admission: RE | Admit: 2014-11-23 | Discharge: 2014-11-23 | Disposition: A | Discharge status: Home / Self Care | Payer: 59 | Source: Ambulatory Visit | Attending: Radiation Oncology | Admitting: Radiation Oncology

## 2014-11-23 DIAGNOSIS — C50411 Malignant neoplasm of upper-outer quadrant of right female breast: Secondary | ICD-10-CM | POA: Diagnosis not present

## 2014-11-23 MED ORDER — RADIAPLEXRX EX GEL
Freq: Once | CUTANEOUS | Status: AC
  Administered 2014-11-23: 12:00:00 via TOPICAL

## 2014-11-23 MED ORDER — ALRA NON-METALLIC DEODORANT (RAD-ONC)
1.0000 "application " | Freq: Once | TOPICAL | Status: AC
  Administered 2014-11-23: 1 via TOPICAL

## 2014-11-23 NOTE — Progress Notes (Signed)
Pt here for patient teaching.  Pt given Radiation and You booklet, skin care instructions, Alra deodorant and Radiaplex gel. Reviewed areas of pertinence such as  . Pt able to give teach back of to pat skin, use unscented/gentle soap and drink plenty of water,apply Radiaplex bid, avoid applying anything to skin within 4 hours of treatment, avoid wearing an under wire bra and to use an electric razor if they must shave. Pt demonstrated understanding and verbalizes understanding of information given and will contact nursing with any questions or concerns.

## 2014-11-24 ENCOUNTER — Ambulatory Visit
Admission: RE | Admit: 2014-11-24 | Discharge: 2014-11-24 | Disposition: A | Discharge status: Home / Self Care | Payer: 59 | Source: Ambulatory Visit | Attending: Radiation Oncology | Admitting: Radiation Oncology

## 2014-11-24 ENCOUNTER — Encounter: Payer: Self-pay | Admitting: *Deleted

## 2014-11-24 ENCOUNTER — Encounter: Payer: Self-pay | Admitting: Radiation Oncology

## 2014-11-24 VITALS — BP 125/61 | HR 62 | Temp 98.0°F | Resp 20 | Wt 126.7 lb

## 2014-11-24 DIAGNOSIS — C50411 Malignant neoplasm of upper-outer quadrant of right female breast: Secondary | ICD-10-CM | POA: Diagnosis not present

## 2014-11-24 NOTE — Progress Notes (Signed)
  Radiation Oncology         [phone]   Name: Mckenzie Sanford MRN: [id number]   Date: 11/24/2014  DOB: 01-29-1956   Weekly Radiation Therapy Management    ICD-9-CM ICD-10-CM   1. Breast cancer of upper-outer quadrant of right female breast 174.4 C50.411     Current Dose: 3.6 Gy  Planned Dose:  45 Gy + boost  Narrative The patient presents for routine under treatment assessment. No skin changes, using radiaplex bid, no c/o pain or discomfort, Appetite good, no fatigue.  The patient is without complaint. Set-up films were reviewed. The chart was checked.  Physical Findings  weight is 126 lb 11.2 oz (57.471 kg). Her oral temperature is 98 F (36.7 C). Her blood pressure is 125/61 and her pulse is 62. Her respiration is 20. . Weight essentially stable.  No significant changes.  Impression The patient is tolerating radiation.  Plan Continue treatment as planned.     This document serves as a record of services personally performed by Tyler Pita, MD. It was created on his behalf by Arlyce Harman, a trained medical scribe. The creation of this record is based on the scribe's personal observations and the provider's statements to them. This document has been checked and approved by the attending provider.       Mckenzie Sanford, M.D.

## 2014-11-24 NOTE — Progress Notes (Signed)
Oncology Nurse Navigator Documentation  Oncology Nurse Navigator Flowsheets 11/24/2014  Navigator Encounter Type Treatment  Patient Visit Type Radonc  Treatment Phase First Radiation Tx  Barriers/Navigation Needs No barriers at this time  Time Spent with Patient 30    Met with pt during wkly check after xrt. Relate doing well and denies needs at this time. Encourage pt to call with questions or concerns. Received verbal understanding.

## 2014-11-24 NOTE — Progress Notes (Signed)
Weekly rad txs right breast,2/33 treatments, no skin  Changes,using radiaplex bid, no c/o pain or discomfort,  Appetite good, no fatigue 2:09 PM BP 125/61 mmHg  Pulse 62  Temp(Src) 98 F (36.7 C) (Oral)  Resp 20  Wt 126 lb 11.2 oz (57.471 kg)  LMP 09/26/2011  Wt Readings from Last 3 Encounters:  11/24/14 126 lb 11.2 oz (57.471 kg)  10/28/14 126 lb 12.8 oz (57.516 kg)  10/22/14 126 lb (57.153 kg)

## 2014-11-25 ENCOUNTER — Ambulatory Visit
Admission: RE | Admit: 2014-11-25 | Discharge: 2014-11-25 | Disposition: A | Discharge status: Home / Self Care | Payer: 59 | Source: Ambulatory Visit | Attending: Radiation Oncology | Admitting: Radiation Oncology

## 2014-11-25 DIAGNOSIS — C50411 Malignant neoplasm of upper-outer quadrant of right female breast: Secondary | ICD-10-CM | POA: Diagnosis not present

## 2014-11-26 ENCOUNTER — Ambulatory Visit
Admission: RE | Admit: 2014-11-26 | Discharge: 2014-11-26 | Disposition: A | Discharge status: Home / Self Care | Payer: 59 | Source: Ambulatory Visit | Attending: Radiation Oncology | Admitting: Radiation Oncology

## 2014-11-26 DIAGNOSIS — C50411 Malignant neoplasm of upper-outer quadrant of right female breast: Secondary | ICD-10-CM | POA: Diagnosis not present

## 2014-11-27 ENCOUNTER — Ambulatory Visit
Admission: RE | Admit: 2014-11-27 | Discharge: 2014-11-27 | Disposition: A | Discharge status: Home / Self Care | Payer: 59 | Source: Ambulatory Visit | Attending: Radiation Oncology | Admitting: Radiation Oncology

## 2014-11-27 DIAGNOSIS — C50411 Malignant neoplasm of upper-outer quadrant of right female breast: Secondary | ICD-10-CM | POA: Diagnosis not present

## 2014-12-01 ENCOUNTER — Ambulatory Visit
Admission: RE | Admit: 2014-12-01 | Discharge: 2014-12-01 | Disposition: A | Discharge status: Home / Self Care | Payer: 59 | Source: Ambulatory Visit | Attending: Radiation Oncology | Admitting: Radiation Oncology

## 2014-12-01 VITALS — Wt 126.3 lb

## 2014-12-01 DIAGNOSIS — C50411 Malignant neoplasm of upper-outer quadrant of right female breast: Secondary | ICD-10-CM

## 2014-12-01 NOTE — Progress Notes (Signed)
Weekly Management Note Current Dose:  10.8 Gy  Projected Dose: 61 Gy   Narrative:  The patient presents for routine under treatment assessment.  CBCT/MVCT images/Port film x-rays were reviewed.  The chart was checked. Doing well. No complaints.   Physical Findings: Weight: 126 lb 4.8 oz (57.289 kg). Unchanged  Impression:  The patient is tolerating radiation.  Plan:  Continue treatment as planned.

## 2014-12-01 NOTE — Progress Notes (Addendum)
Weekly rad txs right breast  6/33 completd, slight tanning under axilla and where nipple area was, skin intact, radiaplex bid   Appetite good, no pain 1:53 PM Wt 126 lb 4.8 oz (57.289 kg)  LMP 09/26/2011  Wt Readings from Last 3 Encounters:  12/01/14 126 lb 4.8 oz (57.289 kg)  11/24/14 126 lb 11.2 oz (57.471 kg)  10/28/14 126 lb 12.8 oz (57.516 kg)

## 2014-12-02 ENCOUNTER — Ambulatory Visit
Admission: RE | Admit: 2014-12-02 | Discharge: 2014-12-02 | Disposition: A | Discharge status: Home / Self Care | Payer: 59 | Source: Ambulatory Visit | Attending: Radiation Oncology | Admitting: Radiation Oncology

## 2014-12-02 DIAGNOSIS — C50411 Malignant neoplasm of upper-outer quadrant of right female breast: Secondary | ICD-10-CM | POA: Diagnosis not present

## 2014-12-03 ENCOUNTER — Ambulatory Visit
Admission: RE | Admit: 2014-12-03 | Discharge: 2014-12-03 | Disposition: A | Discharge status: Home / Self Care | Payer: 59 | Source: Ambulatory Visit | Attending: Radiation Oncology | Admitting: Radiation Oncology

## 2014-12-03 DIAGNOSIS — C50411 Malignant neoplasm of upper-outer quadrant of right female breast: Secondary | ICD-10-CM | POA: Diagnosis not present

## 2014-12-04 ENCOUNTER — Ambulatory Visit
Admission: RE | Admit: 2014-12-04 | Discharge: 2014-12-04 | Disposition: A | Discharge status: Home / Self Care | Payer: 59 | Source: Ambulatory Visit | Attending: Radiation Oncology | Admitting: Radiation Oncology

## 2014-12-04 DIAGNOSIS — C50411 Malignant neoplasm of upper-outer quadrant of right female breast: Secondary | ICD-10-CM | POA: Diagnosis not present

## 2014-12-07 ENCOUNTER — Ambulatory Visit
Admission: RE | Admit: 2014-12-07 | Discharge: 2014-12-07 | Disposition: A | Discharge status: Home / Self Care | Payer: 59 | Source: Ambulatory Visit | Attending: Radiation Oncology | Admitting: Radiation Oncology

## 2014-12-07 DIAGNOSIS — C50411 Malignant neoplasm of upper-outer quadrant of right female breast: Secondary | ICD-10-CM | POA: Diagnosis not present

## 2014-12-08 ENCOUNTER — Ambulatory Visit
Admission: RE | Admit: 2014-12-08 | Discharge: 2014-12-08 | Disposition: A | Discharge status: Home / Self Care | Payer: 59 | Source: Ambulatory Visit | Attending: Radiation Oncology | Admitting: Radiation Oncology

## 2014-12-08 ENCOUNTER — Encounter: Payer: Self-pay | Admitting: Radiation Oncology

## 2014-12-08 VITALS — BP 118/71 | HR 66 | Temp 98.4°F | Resp 20 | Wt 128.4 lb

## 2014-12-08 DIAGNOSIS — C50411 Malignant neoplasm of upper-outer quadrant of right female breast: Secondary | ICD-10-CM | POA: Diagnosis not present

## 2014-12-08 NOTE — Progress Notes (Signed)
Weekly rad trxs right breast, 11/33 completed , hyperpigmentation onl,skin intact, using radiaplex bid, but c/o itching all over after rad tx and whne she gets hot, takes benadryl  Prn,appetite  Good, mild fatigue BP 118/71 mmHg  Pulse 66  Temp(Src) 98.4 F (36.9 C) (Oral)  Resp 20  Wt 128 lb 6.4 oz (58.242 kg)  LMP 09/26/2011  Wt Readings from Last 3 Encounters:  12/08/14 128 lb 6.4 oz (58.242 kg)  12/01/14 126 lb 4.8 oz (57.289 kg)  11/24/14 126 lb 11.2 oz (57.471 kg)

## 2014-12-08 NOTE — Progress Notes (Signed)
Weekly Management Note Current Dose: 19.6  Gy  Projected Dose: 61 Gy   Narrative:  The patient presents for routine under treatment assessment.  CBCT/MVCT images/Port film x-rays were reviewed.  The chart was checked. Doing well. No complaints. Some itching all over body after RT. Responds well to benadryl.   Physical Findings: Weight: 128 lb 6.4 oz (58.242 kg). Minimal skin darkening.   Impression:  The patient is tolerating radiation.  Plan:  Continue treatment as planned. Continue radiaplex. Continue benadryl prn.

## 2014-12-09 ENCOUNTER — Ambulatory Visit
Admission: RE | Admit: 2014-12-09 | Discharge: 2014-12-09 | Disposition: A | Discharge status: Home / Self Care | Payer: 59 | Source: Ambulatory Visit | Attending: Radiation Oncology | Admitting: Radiation Oncology

## 2014-12-09 DIAGNOSIS — C50411 Malignant neoplasm of upper-outer quadrant of right female breast: Secondary | ICD-10-CM | POA: Diagnosis not present

## 2014-12-10 ENCOUNTER — Ambulatory Visit
Admission: RE | Admit: 2014-12-10 | Discharge: 2014-12-10 | Disposition: A | Discharge status: Home / Self Care | Payer: 59 | Source: Ambulatory Visit | Attending: Radiation Oncology | Admitting: Radiation Oncology

## 2014-12-10 DIAGNOSIS — C50411 Malignant neoplasm of upper-outer quadrant of right female breast: Secondary | ICD-10-CM | POA: Diagnosis not present

## 2014-12-11 ENCOUNTER — Ambulatory Visit
Admission: RE | Admit: 2014-12-11 | Discharge: 2014-12-11 | Disposition: A | Discharge status: Home / Self Care | Payer: 59 | Source: Ambulatory Visit | Attending: Radiation Oncology | Admitting: Radiation Oncology

## 2014-12-11 DIAGNOSIS — C50411 Malignant neoplasm of upper-outer quadrant of right female breast: Secondary | ICD-10-CM | POA: Diagnosis not present

## 2014-12-14 ENCOUNTER — Ambulatory Visit
Admission: RE | Admit: 2014-12-14 | Discharge: 2014-12-14 | Disposition: A | Discharge status: Home / Self Care | Payer: 59 | Source: Ambulatory Visit | Attending: Radiation Oncology | Admitting: Radiation Oncology

## 2014-12-14 DIAGNOSIS — C50411 Malignant neoplasm of upper-outer quadrant of right female breast: Secondary | ICD-10-CM | POA: Diagnosis not present

## 2014-12-15 ENCOUNTER — Ambulatory Visit: Payer: 59 | Admitting: Radiation Oncology

## 2014-12-15 ENCOUNTER — Ambulatory Visit
Admission: RE | Admit: 2014-12-15 | Discharge: 2014-12-15 | Disposition: A | Discharge status: Home / Self Care | Payer: 59 | Source: Ambulatory Visit | Attending: Radiation Oncology | Admitting: Radiation Oncology

## 2014-12-15 DIAGNOSIS — C50411 Malignant neoplasm of upper-outer quadrant of right female breast: Secondary | ICD-10-CM | POA: Diagnosis not present

## 2014-12-16 ENCOUNTER — Ambulatory Visit
Admission: RE | Admit: 2014-12-16 | Discharge: 2014-12-16 | Disposition: A | Discharge status: Home / Self Care | Payer: 59 | Source: Ambulatory Visit | Attending: Radiation Oncology | Admitting: Radiation Oncology

## 2014-12-16 DIAGNOSIS — C50411 Malignant neoplasm of upper-outer quadrant of right female breast: Secondary | ICD-10-CM | POA: Diagnosis not present

## 2014-12-16 NOTE — Progress Notes (Signed)
Weekly Management Note Current Dose: 30.6  Gy  Projected Dose: 61 Gy   Narrative:  The patient presents for routine under treatment assessment.  CBCT/MVCT images/Port film x-rays were reviewed.  The chart was checked. Doing well. No complaints. Some itching all over body after RT. Responds well to benadryl. "I was having some tenderness under my right breast when I lay down," Mckenzie Sanford stated and requested more Radioplex gel, later in the week.  Physical Findings: Her weight is currently stable and minimal skin darkening was observed.  Impression:  The patient is tolerating radiation.  Plan: She was advised to continue treatment as planned and instructed to continue administration of both Radiaplex as needed. If she has any additional questions or concerns in regards to her treatment, she was advised to contact Dr. [name], MD.  This document serves as a record of services personally performed by [name] , MD. It was created on her behalf by Lenn Cal, a trained medical scribe. The creation of this record is based on the scribe's personal observations and the provider's statements to them. This document has been checked and approved by the attending provider.   _______________________________________  [name], MD

## 2014-12-17 ENCOUNTER — Ambulatory Visit
Admission: RE | Admit: 2014-12-17 | Discharge: 2014-12-17 | Disposition: A | Discharge status: Home / Self Care | Payer: 59 | Source: Ambulatory Visit | Attending: Radiation Oncology | Admitting: Radiation Oncology

## 2014-12-17 ENCOUNTER — Encounter: Payer: Self-pay | Admitting: Radiation Oncology

## 2014-12-17 DIAGNOSIS — C50411 Malignant neoplasm of upper-outer quadrant of right female breast: Secondary | ICD-10-CM | POA: Diagnosis not present

## 2014-12-18 ENCOUNTER — Ambulatory Visit
Admission: RE | Admit: 2014-12-18 | Discharge: 2014-12-18 | Disposition: A | Discharge status: Home / Self Care | Payer: 59 | Source: Ambulatory Visit | Attending: Radiation Oncology | Admitting: Radiation Oncology

## 2014-12-18 DIAGNOSIS — C50411 Malignant neoplasm of upper-outer quadrant of right female breast: Secondary | ICD-10-CM | POA: Diagnosis not present

## 2014-12-18 MED ORDER — RADIAPLEXRX EX GEL
Freq: Once | CUTANEOUS | Status: AC
  Administered 2014-12-18: 15:00:00 via TOPICAL

## 2014-12-21 ENCOUNTER — Ambulatory Visit
Admission: RE | Admit: 2014-12-21 | Discharge: 2014-12-21 | Disposition: A | Discharge status: Home / Self Care | Payer: 59 | Source: Ambulatory Visit | Attending: Radiation Oncology | Admitting: Radiation Oncology

## 2014-12-21 DIAGNOSIS — C50411 Malignant neoplasm of upper-outer quadrant of right female breast: Secondary | ICD-10-CM | POA: Diagnosis not present

## 2014-12-22 ENCOUNTER — Ambulatory Visit
Admission: RE | Admit: 2014-12-22 | Discharge: 2014-12-22 | Disposition: A | Discharge status: Home / Self Care | Payer: 59 | Source: Ambulatory Visit | Attending: Radiation Oncology | Admitting: Radiation Oncology

## 2014-12-22 VITALS — BP 136/71 | HR 66 | Temp 98.4°F | Wt 127.0 lb

## 2014-12-22 DIAGNOSIS — C50411 Malignant neoplasm of upper-outer quadrant of right female breast: Secondary | ICD-10-CM | POA: Diagnosis not present

## 2014-12-22 NOTE — Progress Notes (Signed)
  Radiation Oncology         [phone] ________________________________  Name: Mckenzie Sanford MRN: [id number]  Date: 12/22/2014  DOB: 1955-09-10  Weekly Radiation Therapy Management  T2N1 Invasive Ductal Carcinoma of Right Breast  Current Dose: 37.8 Gy     Planned Dose:  61 Gy  Narrative . . . . . . . . The patient presents for routine under treatment assessment.                                   The patient is without complaint. . Continue application of radiaplex. Mild discomfort at bedtime.                                 Set-up films were reviewed.                                 The chart was checked. Physical Findings. . .  weight is 127 lb (57.607 kg). Her temperature is 98.4 F (36.9 C). Her blood pressure is 136/71 and her pulse is 66. .  Lungs are clear. Heart has regular rate and rhythm. No palpable cervical, supraclavicular, or axillary adenopathy.. Skin is much darker. Impression . . . . . . . The patient is tolerating radiation. Plan . . . . . . . . . . . . Continue treatment as planned.  This document serves as a record of services personally performed by Gery Pray, MD. It was created on his behalf by Arlyce Harman, a trained medical scribe. The creation of this record is based on the scribe's personal observations and the provider's statements to them. This document has been checked and approved by the attending provider. ________________________________   Blair Promise, PhD, MD

## 2014-12-22 NOTE — Progress Notes (Signed)
Weekly assessment of radiation to right breast.Completed 21 of 33.Skin is much darker.Continue application of radiaplex.Mild discomfort at bedtime.

## 2014-12-23 ENCOUNTER — Ambulatory Visit
Admission: RE | Admit: 2014-12-23 | Discharge: 2014-12-23 | Disposition: A | Discharge status: Home / Self Care | Payer: 59 | Source: Ambulatory Visit | Attending: Radiation Oncology | Admitting: Radiation Oncology

## 2014-12-23 ENCOUNTER — Telehealth: Payer: Self-pay | Admitting: Hematology and Oncology

## 2014-12-23 DIAGNOSIS — C50411 Malignant neoplasm of upper-outer quadrant of right female breast: Secondary | ICD-10-CM | POA: Diagnosis not present

## 2014-12-23 NOTE — Telephone Encounter (Signed)
Faxed pt medical records to Dr. Garwin Brothers [phone]

## 2014-12-24 ENCOUNTER — Ambulatory Visit
Admission: RE | Admit: 2014-12-24 | Discharge: 2014-12-24 | Disposition: A | Discharge status: Home / Self Care | Payer: 59 | Source: Ambulatory Visit | Attending: Radiation Oncology | Admitting: Radiation Oncology

## 2014-12-24 DIAGNOSIS — C50411 Malignant neoplasm of upper-outer quadrant of right female breast: Secondary | ICD-10-CM | POA: Diagnosis not present

## 2014-12-25 ENCOUNTER — Ambulatory Visit
Admission: RE | Admit: 2014-12-25 | Discharge: 2014-12-25 | Disposition: A | Discharge status: Home / Self Care | Payer: 59 | Source: Ambulatory Visit | Attending: Radiation Oncology | Admitting: Radiation Oncology

## 2014-12-25 DIAGNOSIS — C50411 Malignant neoplasm of upper-outer quadrant of right female breast: Secondary | ICD-10-CM | POA: Diagnosis not present

## 2014-12-28 ENCOUNTER — Telehealth: Payer: Self-pay

## 2014-12-28 ENCOUNTER — Ambulatory Visit
Admission: RE | Admit: 2014-12-28 | Discharge: 2014-12-28 | Disposition: A | Discharge status: Home / Self Care | Payer: 59 | Source: Ambulatory Visit | Attending: Radiation Oncology | Admitting: Radiation Oncology

## 2014-12-28 DIAGNOSIS — C50411 Malignant neoplasm of upper-outer quadrant of right female breast: Secondary | ICD-10-CM | POA: Diagnosis not present

## 2014-12-28 NOTE — Telephone Encounter (Signed)
Copy of dental referral letter rcvd by mail from Dr. Docia Barrier.  Reviewed by Dr. [name].  Sent to scan.

## 2014-12-29 ENCOUNTER — Ambulatory Visit
Admission: RE | Admit: 2014-12-29 | Discharge: 2014-12-29 | Disposition: A | Discharge status: Home / Self Care | Payer: 59 | Source: Ambulatory Visit | Attending: Radiation Oncology | Admitting: Radiation Oncology

## 2014-12-29 DIAGNOSIS — C50411 Malignant neoplasm of upper-outer quadrant of right female breast: Secondary | ICD-10-CM | POA: Diagnosis not present

## 2014-12-29 MED ORDER — BIAFINE EX EMUL
Freq: Every day | CUTANEOUS | Status: DC
  Administered 2014-12-29: 13:00:00 via TOPICAL

## 2014-12-29 NOTE — Progress Notes (Signed)
MD saw Ptaient in the back, not sent to nursing for assessment,gave biafine cream as per MD request to patint Not otherwise examined today. 12:59 PM

## 2014-12-29 NOTE — Progress Notes (Signed)
Weekly Management Note Current Dose: 47  Gy  Projected Dose: 61 Gy   Narrative:  The patient presents for routine under treatment assessment.  CBCT/MVCT images/Port film x-rays were reviewed.  The chart was checked. I saw the pt on the treatment table. She has itching over her right breast and entire body. Radiaplex not effective.  Physical Findings: Her weight is currently stable and hyperpigmentation of her right breast was noted.  Impression:  The patient is tolerating radiation.  Plan: She was advised to continue treatment as planned. I will switch her to Biafine and approved her setup to proceed with her boost.   This document serves as a record of services personally performed by [name], MD. It was created on her behalf by Darcus Austin, a trained medical scribe. The creation of this record is based on the scribe's personal observations and the provider's statements to them. This document has been checked and approved by the attending provider.   _______________________________________  [name], MD

## 2014-12-30 ENCOUNTER — Ambulatory Visit
Admission: RE | Admit: 2014-12-30 | Discharge: 2014-12-30 | Disposition: A | Discharge status: Home / Self Care | Payer: 59 | Source: Ambulatory Visit | Attending: Radiation Oncology | Admitting: Radiation Oncology

## 2014-12-30 DIAGNOSIS — C50411 Malignant neoplasm of upper-outer quadrant of right female breast: Secondary | ICD-10-CM | POA: Diagnosis not present

## 2014-12-31 ENCOUNTER — Ambulatory Visit
Admission: RE | Admit: 2014-12-31 | Discharge: 2014-12-31 | Disposition: A | Discharge status: Home / Self Care | Payer: 59 | Source: Ambulatory Visit | Attending: Radiation Oncology | Admitting: Radiation Oncology

## 2014-12-31 DIAGNOSIS — C50411 Malignant neoplasm of upper-outer quadrant of right female breast: Secondary | ICD-10-CM | POA: Diagnosis not present

## 2015-01-01 ENCOUNTER — Other Ambulatory Visit: Payer: Self-pay | Admitting: *Deleted

## 2015-01-01 ENCOUNTER — Ambulatory Visit
Admission: RE | Admit: 2015-01-01 | Discharge: 2015-01-01 | Disposition: A | Discharge status: Home / Self Care | Payer: 59 | Source: Ambulatory Visit | Attending: Radiation Oncology | Admitting: Radiation Oncology

## 2015-01-01 DIAGNOSIS — C50411 Malignant neoplasm of upper-outer quadrant of right female breast: Secondary | ICD-10-CM

## 2015-01-01 MED ORDER — HYDROCODONE-ACETAMINOPHEN 5-325 MG PO TABS: 1.0000 | ORAL_TABLET | ORAL | Status: DC | PRN

## 2015-01-01 MED ORDER — LORAZEPAM 0.5 MG PO TABS
0.5000 mg | ORAL_TABLET | Freq: Every day | ORAL | Status: DC
Start: 2015-01-01 — End: 2016-02-16

## 2015-01-04 ENCOUNTER — Ambulatory Visit
Admission: RE | Admit: 2015-01-04 | Discharge: 2015-01-04 | Disposition: A | Discharge status: Home / Self Care | Payer: 59 | Source: Ambulatory Visit | Attending: Radiation Oncology | Admitting: Radiation Oncology

## 2015-01-04 DIAGNOSIS — C50411 Malignant neoplasm of upper-outer quadrant of right female breast: Secondary | ICD-10-CM | POA: Diagnosis not present

## 2015-01-05 ENCOUNTER — Ambulatory Visit
Admission: RE | Admit: 2015-01-05 | Discharge: 2015-01-05 | Disposition: A | Discharge status: Home / Self Care | Payer: 59 | Source: Ambulatory Visit | Attending: Radiation Oncology | Admitting: Radiation Oncology

## 2015-01-05 VITALS — BP 120/79 | HR 61 | Temp 98.2°F | Wt 126.0 lb

## 2015-01-05 DIAGNOSIS — C50411 Malignant neoplasm of upper-outer quadrant of right female breast: Secondary | ICD-10-CM

## 2015-01-05 NOTE — Progress Notes (Signed)
Weekly assessment of radiation to right breast.Marked hyperpigmentation with dry peel.Continue application of biafine 2 to 3 times daily..Completes radiation on Thursday 01/07/15.Increase in pain and fatigue.Has script for hydrocodone to pick up today.Given another tube of biafine.One month follow up card given.

## 2015-01-05 NOTE — Progress Notes (Signed)
Weekly Management Note Current Dose:  57 Gy  Projected Dose: 61 Gy   Narrative:  The patient presents for routine under treatment assessment.  CBCT/MVCT images/Port film x-rays were reviewed.  The chart was checked. Skin sore. Fatigue continues. ON intermittent disability. Appt with [facility] 9/21  Physical Findings: Weight: 126 lb (57.153 kg). Unchanged. Dark left breast. Dry desquamation in axilla.   Impression:  The patient is tolerating radiation.  Plan:  Continue treatment as planned. Continue biafene. Post RT skin care discussed.

## 2015-01-06 ENCOUNTER — Ambulatory Visit
Admission: RE | Admit: 2015-01-06 | Discharge: 2015-01-06 | Disposition: A | Discharge status: Home / Self Care | Payer: 59 | Source: Ambulatory Visit | Attending: Radiation Oncology | Admitting: Radiation Oncology

## 2015-01-06 DIAGNOSIS — C50411 Malignant neoplasm of upper-outer quadrant of right female breast: Secondary | ICD-10-CM | POA: Diagnosis not present

## 2015-01-07 ENCOUNTER — Ambulatory Visit
Admission: RE | Admit: 2015-01-07 | Discharge: 2015-01-07 | Disposition: A | Discharge status: Home / Self Care | Payer: 59 | Source: Ambulatory Visit | Attending: Radiation Oncology | Admitting: Radiation Oncology

## 2015-01-07 ENCOUNTER — Encounter: Payer: Self-pay | Admitting: *Deleted

## 2015-01-07 ENCOUNTER — Encounter: Payer: Self-pay | Admitting: Radiation Oncology

## 2015-01-07 DIAGNOSIS — C50411 Malignant neoplasm of upper-outer quadrant of right female breast: Secondary | ICD-10-CM | POA: Diagnosis not present

## 2015-01-10 NOTE — Progress Notes (Signed)
Name: KHYLEI WILMS   MRN: [id number]  Date:  12/17/2014   DOB: December 08, 1955  Status:outpatient    DIAGNOSIS: Breast cancer of upper-outer quadrant of right female breast   Staging form: Breast, AJCC 7th Edition     Clinical: Stage IA (T1b, N0, cM0) - Signed by Rulon Eisenmenger, MD on 03/20/2014     Pathologic: Stage IIB (T2, N1, cM0) - Unsigned   CONSENT VERIFIED: yes   SET UP: Patient is setup supine   IMMOBILIZATION:  The following immobilization was used:Custom Moldable Pillow, breast board.   NARRATIVE: Lular L Metts underwent complex simulation and treatment planning for her boost treatment today.  Her tumor volume was outlined on the planning CT scan. The depth of her cavity was felt to be appropriate for treatment with electrons    12  MeV electrons will be prescribed to the 100%  isodose line.   I personally oversaw and approved the construction of a unique block which will be used for beam modification purposes.  An isodose plan is requested.

## 2015-01-20 NOTE — Progress Notes (Signed)
  Radiation Oncology         [phone] ________________________________  Name: Mckenzie Sanford MRN: [id number]  Date: 01/07/2015  DOB: 1956-03-05  End of Treatment Note  Diagnosis:   Breast cancer of upper-outer quadrant of right female breast   Staging form: Breast, AJCC 7th Edition     Clinical: Stage IA (T1b, N0, cM0) - Signed by Rulon Eisenmenger, MD on 03/20/2014     Pathologic: Stage IIB (T2, N1, cM0) - Unsigned    Indication for treatment: Curative    Radiation treatment dates:   11/23/2014-01/07/2015  Site/dose:    Right breast / 45 Gray @ 1.8 Pearline Cables per fraction x 25 fractions Right breast boost / 16 Gray at [employer] per fraction x 8 fractions  Beams/energy:  Opposed Tangents / 6 MV photons 3D-Conformal, 6 and 15 MV photons  Narrative: The patient tolerated radiation treatment relatively well.   She had some skin darkening and fatigue but was able to complete treatment.   Plan: The patient has completed radiation treatment. The patient will return to radiation oncology clinic for routine followup in one month. I advised them to call or return sooner if they have any questions or concerns related to their recovery or treatment.  ------------------------------------------------  [name], MD  This document serves as a record of services personally performed by [name], MD. It was created on her behalf by Derek Mound, a trained medical scribe. The creation of this record is based on the scribe's personal observations and the provider's statements to them. This document has been checked and approved by the attending provider.

## 2015-02-17 ENCOUNTER — Ambulatory Visit (HOSPITAL_BASED_OUTPATIENT_CLINIC_OR_DEPARTMENT_OTHER): Payer: 59 | Admitting: Hematology and Oncology

## 2015-02-17 ENCOUNTER — Telehealth: Payer: Self-pay | Admitting: Hematology and Oncology

## 2015-02-17 ENCOUNTER — Other Ambulatory Visit (HOSPITAL_BASED_OUTPATIENT_CLINIC_OR_DEPARTMENT_OTHER): Payer: 59

## 2015-02-17 ENCOUNTER — Encounter: Payer: Self-pay | Admitting: Hematology and Oncology

## 2015-02-17 VITALS — BP 145/76 | HR 84 | Temp 98.4°F | Resp 18 | Ht 62.0 in | Wt 125.3 lb

## 2015-02-17 DIAGNOSIS — C50411 Malignant neoplasm of upper-outer quadrant of right female breast: Secondary | ICD-10-CM

## 2015-02-17 DIAGNOSIS — C773 Secondary and unspecified malignant neoplasm of axilla and upper limb lymph nodes: Secondary | ICD-10-CM | POA: Diagnosis not present

## 2015-02-17 LAB — COMPREHENSIVE METABOLIC PANEL (CC13)
ALT: 38 U/L (ref 0–55)
AST: 28 U/L (ref 5–34)
Albumin: 4.1 g/dL (ref 3.5–5.0)
Alkaline Phosphatase: 151 U/L — ABNORMAL HIGH (ref 40–150)
Anion Gap: 9 meq/L (ref 3–11)
BUN: 15.7 mg/dL (ref 7.0–26.0)
CO2: 29 meq/L (ref 22–29)
Calcium: 10 mg/dL (ref 8.4–10.4)
Chloride: 104 meq/L (ref 98–109)
Creatinine: 0.7 mg/dL (ref 0.6–1.1)
EGFR: >90 ml/min/1.73 m2
Glucose: 102 mg/dL (ref 70–140)
Potassium: 3.6 meq/L (ref 3.5–5.1)
Sodium: 142 meq/L (ref 136–145)
Total Bilirubin: <0.3 mg/dL (ref 0.20–1.20)
Total Protein: 7.3 g/dL (ref 6.4–8.3)

## 2015-02-17 LAB — CBC WITH DIFFERENTIAL/PLATELET
BASO%: 1.1 % (ref 0.0–2.0)
Basophils Absolute: 0.1 10e3/uL (ref 0.0–0.1)
EOS%: 3.5 % (ref 0.0–7.0)
Eosinophils Absolute: 0.2 10e3/uL (ref 0.0–0.5)
HCT: 39.3 % (ref 34.8–46.6)
HGB: 13.5 g/dL (ref 11.6–15.9)
LYMPH%: 24.6 % (ref 14.0–49.7)
MCH: 30.5 pg (ref 25.1–34.0)
MCHC: 34.4 g/dL (ref 31.5–36.0)
MCV: 88.7 fL (ref 79.5–101.0)
MONO#: 0.5 10e3/uL (ref 0.1–0.9)
MONO%: 10 % (ref 0.0–14.0)
NEUT#: 2.8 10e3/uL (ref 1.5–6.5)
NEUT%: 60.8 % (ref 38.4–76.8)
Platelets: 277 10e3/uL (ref 145–400)
RBC: 4.43 10e6/uL (ref 3.70–5.45)
RDW: 12.9 % (ref 11.2–14.5)
WBC: 4.6 10e3/uL (ref 3.9–10.3)
lymph#: 1.1 10e3/uL (ref 0.9–3.3)

## 2015-02-17 MED ORDER — ANASTROZOLE 1 MG PO TABS: 1.0000 mg | ORAL_TABLET | Freq: Every day | ORAL | Status: DC

## 2015-02-17 NOTE — Assessment & Plan Note (Signed)
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 Recommendation: Tamoxifen 20 mg daily 5-10 years  Tamoxifen counseling: We discussed the risks and benefits of tamoxifen. These include but not limited to insomnia, hot flashes, mood changes, vaginal dryness, and weight gain. Although rare, serious side effects including endometrial cancer, risk of blood clots were also discussed. We strongly believe that the benefits far outweigh the risks. Patient understands these risks and consented to starting treatment. Planned treatment duration is 5-10 years.  Return to clinic in 3 months for toxicity check and follow-up

## 2015-02-17 NOTE — Telephone Encounter (Signed)
Appointments made and avs printed for patient °

## 2015-02-17 NOTE — Progress Notes (Signed)
Patient Care Team: [name], MD as PCP - General (Internal Medicine) [name], MD as Consulting Physician (General Surgery) [name], MD as Consulting Physician (Hematology and Oncology) [name], MD as Consulting Physician (Radiation Oncology) [name], MD as Consulting Physician (Obstetrics and Gynecology)  DIAGNOSIS: Breast cancer of upper-outer quadrant of right female breast   Staging form: Breast, AJCC 7th Edition     Clinical: Stage IA (T1b, N0, cM0) - Signed by Rulon Eisenmenger, MD on 03/20/2014     Pathologic: Stage IIB (T2, N1, cM0) - Unsigned   SUMMARY OF ONCOLOGIC HISTORY:   Breast cancer of upper-outer quadrant of right female breast   03/12/2014 Initial Biopsy Invasive ductal carcinoma with DCIS, right axillary lymph node biopsy negative, ER percent, PR 84%, Ki-67 16%, HER-2 negative   03/20/2014 Breast MRI Right breast upper outer quadrant 2.6 x 1.9 x 1.7 cm invasive ductal carcinoma with extension and involvement of the adjacent skin and nipple area complex, no abnormal lymph nodes   04/20/2014 Surgery Right breast lumpectomy: IDC grade 2; 2.5 cm with intermediate to high-grade DCIS margins negative: 1/3 positive sentinel lymph node with extracapsular extension, ER 100%, PR 84%, HER-2 negative ratio 1.22, Ki-67 16% T2, N1, M0 stage II B   06/04/2014 - 10/22/2014 Chemotherapy Adjuvant chemotherapy dose dense Adriamycin and Cytoxan 4 followed by Abraxane 12   11/23/2014 - 01/07/2015 Radiation Therapy Right breast / 45 Gray @ 1.8 Pearline Cables per fraction x 25 fractions Right breast boost / 16 Gray at [employer] per fraction x 8 fractions    CHIEF COMPLIANT: Follow-up after radiation to start Antiestrogen therapy  INTERVAL HISTORY: Mckenzie Sanford is a 59 year old with above-mentioned history of right breast cancer treated with adjuvant chemotherapy and radiation, is here today to discuss starting antiestrogen therapy. She complains of itching sensation  throughout her skin. It slowly getting better with time. She attributes this to radiation therapy. 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 Energy levels are recovering. She is working more hours in the classroom.  REVIEW OF SYSTEMS:   Constitutional: Denies fevers, chills or abnormal weight loss Eyes: Denies blurriness of vision Ears, nose, mouth, throat, and face: Denies mucositis or sore throat Respiratory: Denies cough, dyspnea or wheezes Cardiovascular: Denies palpitation, chest discomfort or lower extremity swelling Gastrointestinal:  Denies nausea, heartburn or change in bowel habits Skin: Itching sensation without any rashes Lymphatics: Denies new lymphadenopathy or easy bruising Neurological:Denies numbness, tingling or new weaknesses Behavioral/Psych: Mood is stable, no new changes  Breast:  denies any pain or lumps or nodules in either breasts All other systems were reviewed with the patient and are negative.  I have reviewed the past medical history, past surgical history, social history and family history with the patient and they are unchanged from previous note.  ALLERGIES:  is allergic to shellfish allergy; ivp dye; and penicillins.  MEDICATIONS:  Current Outpatient Prescriptions  Medication Sig Dispense Refill  . acetaminophen (TYLENOL) 325 MG tablet Take 325 mg by mouth 1 day or 1 dose.    Marland Kitchen amLODipine (NORVASC) 10 MG tablet Take 10 mg by mouth daily.    . Calcium Carb-Cholecalciferol (LIQUID CALCIUM WITH D3) [phone] MG-UNIT CAPS Take 600 mg by mouth. Calcium 600 mg with 400 iu of D3    . diphenhydrAMINE (SOMINEX) 25 MG tablet Take 25 mg by mouth at bedtime as needed for itching or sleep.    Marland Kitchen emollient (BIAFINE) cream Apply 1 application topically 2 (two) times  daily.    Marland Kitchen HYDROcodone-acetaminophen (NORCO/VICODIN) 5-325 MG per tablet Take 1-2 tablets by mouth every 4 (four) hours as needed for moderate pain or severe pain. 30 tablet 0  . irbesartan  (AVAPRO) 150 MG tablet Take 150 mg by mouth daily.     Marland Kitchen lidocaine-prilocaine (EMLA) cream   3  . LINZESS 290 MCG CAPS capsule Take 290 mcg by mouth daily as needed.  11  . LORazepam (ATIVAN) 0.5 MG tablet Take 1 tablet (0.5 mg total) by mouth at bedtime. 30 tablet 0  . metoprolol succinate (TOPROL-XL) 50 MG 24 hr tablet Take 50 mg by mouth daily. Take with or immediately following a meal.    . Multiple Vitamins-Minerals (MULTIVITAMIN ADULT PO) Take by mouth.    . sertraline (ZOLOFT) 25 MG tablet at bedtime.   11  . simvastatin (ZOCOR) 40 MG tablet Take 40 mg by mouth every evening.     No current facility-administered medications for this visit.    PHYSICAL EXAMINATION: ECOG PERFORMANCE STATUS: 1 - Symptomatic but completely ambulatory  Filed Vitals:   02/17/15 1524  BP: 145/76  Pulse: 84  Temp: 98.4 F (36.9 C)  Resp: 18   Filed Weights   02/17/15 1524  Weight: 125 lb 4.8 oz (56.836 kg)    GENERAL:alert, no distress and comfortable SKIN: skin color, texture, turgor are normal, no rashes or significant lesions EYES: normal, Conjunctiva are pink and non-injected, sclera clear OROPHARYNX:no exudate, no erythema and lips, buccal mucosa, and tongue normal  NECK: supple, thyroid normal size, non-tender, without nodularity LYMPH:  no palpable lymphadenopathy in the cervical, axillary or inguinal LUNGS: clear to auscultation and percussion with normal breathing effort HEART: regular rate & rhythm and no murmurs and no lower extremity edema ABDOMEN:abdomen soft, non-tender and normal bowel sounds Musculoskeletal:no cyanosis of digits and no clubbing  NEURO: alert & oriented x 3 with fluent speech, no focal motor/sensory deficits  LABORATORY DATA:  I have reviewed the data as listed   Chemistry      Component Value Date/Time   NA 142 10/22/2014 0828   NA 140 09/10/2014 0909   K 3.4* 10/22/2014 0828   K 3.6 09/10/2014 0909   CL 105 09/10/2014 0909   CO2 26 10/22/2014 0828    CO2 28 09/10/2014 0909   BUN 14.4 10/22/2014 0828   BUN 7 09/10/2014 0909   CREATININE 0.7 10/22/2014 0828   CREATININE 0.65 09/10/2014 0909   CREATININE 0.72 12/11/2011 1238      Component Value Date/Time   CALCIUM 9.3 10/22/2014 0828   CALCIUM 10.1 09/10/2014 0909   ALKPHOS 122 10/22/2014 0828   ALKPHOS 142* 09/10/2014 0909   AST 20 10/22/2014 0828   AST 31 09/10/2014 0909   ALT 25 10/22/2014 0828   ALT 41* 09/10/2014 0909   BILITOT 0.24 10/22/2014 0828   BILITOT 0.3 09/10/2014 0909       Lab Results  Component Value Date   WBC 4.6 02/17/2015   HGB 13.5 02/17/2015   HCT 39.3 02/17/2015   MCV 88.7 02/17/2015   PLT 277 02/17/2015   NEUTROABS 2.8 02/17/2015     RADIOGRAPHIC STUDIES: I have personally reviewed the radiology reports and agreed with their findings. No results found.   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 Recommendation: Masses of 1 mg daily 5 years  Anastrozole counseling:We discussed the risks and benefits of anti-estrogen therapy with aromatase inhibitors. These include but not limited to insomnia, hot flashes, mood changes, vaginal dryness, bone density loss, and weight gain. Although rare, serious side effects including endometrial cancer, risk of blood clots were also discussed. We strongly believe that the benefits far outweigh the risks. Patient understands these risks and consented to starting treatment. Planned treatment duration is 5 years.   Return to clinic in 3 months for  toxicity check and follow-up  No orders of the defined types were placed in this encounter.   The patient has a good understanding of the overall plan. she agrees with it. she will call with any problems that may develop before the next visit here.   Rulon Eisenmenger, MD

## 2015-02-18 ENCOUNTER — Encounter: Payer: Self-pay | Admitting: *Deleted

## 2015-02-25 ENCOUNTER — Ambulatory Visit
Admission: RE | Admit: 2015-02-25 | Discharge: 2015-02-25 | Disposition: A | Discharge status: Home / Self Care | Payer: 59 | Source: Ambulatory Visit | Attending: Radiation Oncology | Admitting: Radiation Oncology

## 2015-02-25 ENCOUNTER — Other Ambulatory Visit: Payer: Self-pay | Admitting: *Deleted

## 2015-02-25 VITALS — BP 140/71 | HR 67 | Temp 98.2°F | Wt 126.8 lb

## 2015-02-25 DIAGNOSIS — C50411 Malignant neoplasm of upper-outer quadrant of right female breast: Secondary | ICD-10-CM

## 2015-02-25 NOTE — Progress Notes (Signed)
Department of Radiation Oncology  Phone:  [phone] Fax:        [phone]   Name: Mckenzie Sanford MRN: [id number]  DOB: [birth year]  Date: 02/25/2015  Follow Up Visit Note  Diagnosis: Breast cancer of upper-outer quadrant of right female breast   Staging form: Breast, AJCC 7th Edition     Clinical: Stage IA (T1b, N0, cM0) - Signed by Rulon Eisenmenger, MD on 03/20/2014     Pathologic: Stage IIB (T2, N1, cM0) - Unsigned  Summary and Interval since last radiation: 7 weeks The patient's radiation treatment dates extended from 11/23/2014-01/07/2015. The site and dose include the right breast at 45 Gray @ 1.8 Pearline Cables per fraction x 25 fractions and the right breast boost at 16 Gray at [employer] per fraction x 8 fractions. The beams and energy used includes Opposed Tangents / 6 MV photons 3D-Conformal, 6 and 15 MV photons.  Interval History: Mckenzie Sanford presents today for routine follow-up appointment with radiation oncology. She is doing well after treatment. She repots generalized itching. She takes Adovin and Benadryl to alleviate this symptom. She is tolerating the prescribed medication Anastrozole well. She has follow-up with Dr. [name] in December of 2016. She has signed up for the [employer] Program and will be meeting with our survivorship navigator soon. She is over-all pleased with her cosmetic result, but would like to see plastic surgery for symmetry in the distance future. Her energy levels are improving, but not yet back to normal. The patient projected a health mental status and was not accompanied by family for today's radiation oncology visit. She had some questions of concern in relation to radiation bill.   Physical Exam: The patient is alert and oriented. There is no significant changes to the status of the paients overall health to be noted at this time. Filed Vitals:   02/25/15 1346  BP: 140/71  Pulse: 67  Temp: 98.2 F (36.8 C)  Weight: 126 lb 12.8 oz (57.516 kg)    Right Breast: hyperpigmentation, worse in the area of the boost  IMPRESSION: Mckenzie Sanford is a 59 y.o. female presenting with cancer of the right female breast. She is healing well from radiation therapy treatments and managing reports symptoms appropriately. She understands the importance of completing annual mammograms. The patient understands that she can access her appointments and medical records via [facility].  PLAN: The patient should continue follow up with medical oncology and surgery as scheduled every 4-6 months.  I will see her back on a prn basis.  The patient was educated on the importance of completing a yearly mammograms which she can schedule with her OBGYN or with medical oncology. Sun protection in the treated area was strongly encouraged. Other healthy methods of management in regards to the treatment of her disease and recovery were discussed in detail. She has been instructed to to continue Vitamin E for hyperpigmentation and she is going to meet with a financial advocate in regarding her insurance questions. All vocalized questions and concerns have been addressed. If the patient develops any further questions or concerns in regards to her treatment and recovery, she has been encouraged to contact Dr. [name], MD. She will be referred to survivorship  This document serves as a record of services personally performed by [name] , MD. It was created on her behalf by Lenn Cal, a trained medical scribe. The creation of this record is based on the scribe's personal observations and the provider's statements to them. This document has  been checked and approved by the attending provider.   ------------------------------------------------  [name], MD

## 2015-02-25 NOTE — Progress Notes (Signed)
Letter for extension of work hour complete. Referral for survivorship program made.

## 2015-03-05 NOTE — Progress Notes (Signed)
Please put orders in Epic surgery 03-12-15 pre op 03-11-15 Thanks

## 2015-03-09 ENCOUNTER — Encounter (HOSPITAL_COMMUNITY): Payer: Self-pay

## 2015-03-09 ENCOUNTER — Other Ambulatory Visit: Payer: Self-pay | Admitting: General Surgery

## 2015-03-09 NOTE — Patient Instructions (Addendum)
YOUR PROCEDURE IS SCHEDULED ON :  03/12/15  REPORT TO [facility] MAIN ENTRANCE FOLLOW SIGNS TO EAST ELEVATOR - GO TO 3rd FLOOR CHECK IN AT 3 EAST NURSES STATION (SHORT STAY) AT:  9:45 AM  CALL THIS NUMBER IF YOU HAVE PROBLEMS THE MORNING OF SURGERY [phone]  REMEMBER:ONLY 1 PER PERSON MAY GO TO SHORT STAY WITH YOU TO GET READY THE MORNING OF YOUR SURGERY  DO NOT EAT FOOD OR DRINK LIQUIDS AFTER MIDNIGHT  TAKE THESE MEDICINES THE MORNING OF SURGERY:  ANASTROZOLE / METOPROLOL / HYDROCODONE IF NEEDED  MAY HAVE WATER UNTIL 5:45 AM  YOU MAY NOT HAVE ANY METAL ON YOUR BODY INCLUDING HAIR PINS AND PIERCING'S. DO NOT WEAR JEWELRY, MAKEUP, LOTIONS, POWDERS OR PERFUMES. DO NOT WEAR NAIL POLISH. DO NOT SHAVE 48 HRS PRIOR TO SURGERY. MEN MAY SHAVE FACE AND NECK.  DO NOT [place]. [facility] IS NOT RESPONSIBLE FOR VALUABLES.  CONTACTS, DENTURES OR PARTIALS MAY NOT BE WORN TO SURGERY. LEAVE SUITCASE IN CAR. CAN BE BROUGHT TO ROOM AFTER SURGERY.  PATIENTS DISCHARGED THE DAY OF SURGERY WILL NOT BE ALLOWED TO DRIVE HOME.  PLEASE READ OVER THE FOLLOWING INSTRUCTION SHEETS _________________________________________________________________________________                                          [facility] - PREPARING FOR SURGERY  Before surgery, you can play an important role.  Because skin is not sterile, your skin needs to be as free of germs as possible.  You can reduce the number of germs on your skin by washing with CHG (chlorahexidine gluconate) soap before surgery.  CHG is an antiseptic cleaner which kills germs and bonds with the skin to continue killing germs even after washing. Please DO NOT use if you have an allergy to CHG or antibacterial soaps.  If your skin becomes reddened/irritated stop using the CHG and inform your nurse when you arrive at Short Stay. Do not shave (including legs and underarms) for at least 48 hours prior to the first CHG  shower.  You may shave your face. Please follow these instructions carefully:   1.  Shower with CHG Soap the night before surgery and the  morning of Surgery.   2.  If you choose to wash your hair, wash your hair first as usual with your  normal  Shampoo.   3.  After you shampoo, rinse your hair and body thoroughly to remove the  shampoo.                                         4.  Use CHG as you would any other liquid soap.  You can apply chg directly  to the skin and wash . Gently wash with scrungie or clean wascloth    5.  Apply the CHG Soap to your body ONLY FROM THE NECK DOWN.   Do not use on open                           Wound or open sores. Avoid contact with eyes, ears mouth and genitals (private parts).  Genitals (private parts) with your normal soap.              6.  Wash thoroughly, paying special attention to the area where your surgery  will be performed.   7.  Thoroughly rinse your body with warm water from the neck down.   8.  DO NOT shower/wash with your normal soap after using and rinsing off  the CHG Soap .                9.  Pat yourself dry with a clean towel.             10.  Wear clean night clothes to bed after shower             11.  Place clean sheets on your bed the night of your first shower and do not  sleep with pets.  Day of Surgery : Do not apply any lotions/deodorants the morning of surgery.  Please wear clean clothes to the hospital/surgery center.  FAILURE TO FOLLOW THESE INSTRUCTIONS MAY RESULT IN THE CANCELLATION OF YOUR SURGERY    PATIENT SIGNATURE_________________________________  ______________________________________________________________________

## 2015-03-11 ENCOUNTER — Encounter (HOSPITAL_COMMUNITY)
Admission: RE | Admit: 2015-03-11 | Discharge: 2015-03-11 | Disposition: A | Discharge status: Home / Self Care | Payer: 59 | Source: Ambulatory Visit | Attending: General Surgery | Admitting: General Surgery

## 2015-03-11 ENCOUNTER — Other Ambulatory Visit: Payer: Self-pay | Admitting: General Surgery

## 2015-03-11 ENCOUNTER — Encounter (HOSPITAL_COMMUNITY): Payer: Self-pay

## 2015-03-11 DIAGNOSIS — Z79899 Other long term (current) drug therapy: Secondary | ICD-10-CM | POA: Diagnosis not present

## 2015-03-11 DIAGNOSIS — E7800 Pure hypercholesterolemia, unspecified: Secondary | ICD-10-CM | POA: Diagnosis not present

## 2015-03-11 DIAGNOSIS — Z9221 Personal history of antineoplastic chemotherapy: Secondary | ICD-10-CM | POA: Diagnosis not present

## 2015-03-11 DIAGNOSIS — Z452 Encounter for adjustment and management of vascular access device: Secondary | ICD-10-CM | POA: Diagnosis not present

## 2015-03-11 DIAGNOSIS — Z79891 Long term (current) use of opiate analgesic: Secondary | ICD-10-CM | POA: Diagnosis not present

## 2015-03-11 DIAGNOSIS — I10 Essential (primary) hypertension: Secondary | ICD-10-CM | POA: Diagnosis not present

## 2015-03-11 DIAGNOSIS — Z853 Personal history of malignant neoplasm of breast: Secondary | ICD-10-CM | POA: Diagnosis not present

## 2015-03-11 DIAGNOSIS — M4690 Unspecified inflammatory spondylopathy, site unspecified: Secondary | ICD-10-CM | POA: Diagnosis not present

## 2015-03-11 HISTORY — DX: Anxiety disorder, unspecified: F41.9

## 2015-03-11 HISTORY — DX: Sleep disorder, unspecified: G47.9

## 2015-03-11 HISTORY — DX: Unspecified osteoarthritis, unspecified site: M19.90

## 2015-03-11 LAB — PROTIME-INR
INR: 0.92 (ref 0.00–1.49)
Prothrombin Time: 12.6 seconds (ref 11.6–15.2)

## 2015-03-12 ENCOUNTER — Encounter (HOSPITAL_COMMUNITY)
Admission: RE | Disposition: A | Discharge status: Home / Self Care | Payer: Self-pay | Source: Ambulatory Visit | Attending: General Surgery

## 2015-03-12 ENCOUNTER — Ambulatory Visit (HOSPITAL_COMMUNITY)
Admission: RE | Admit: 2015-03-12 | Discharge: 2015-03-12 | Disposition: A | Discharge status: Home / Self Care | Payer: 59 | Source: Ambulatory Visit | Attending: General Surgery | Admitting: General Surgery

## 2015-03-12 ENCOUNTER — Ambulatory Visit (HOSPITAL_COMMUNITY): Payer: 59 | Admitting: Anesthesiology

## 2015-03-12 ENCOUNTER — Encounter (HOSPITAL_COMMUNITY): Payer: Self-pay | Admitting: *Deleted

## 2015-03-12 DIAGNOSIS — M4690 Unspecified inflammatory spondylopathy, site unspecified: Secondary | ICD-10-CM | POA: Insufficient documentation

## 2015-03-12 DIAGNOSIS — Z9221 Personal history of antineoplastic chemotherapy: Secondary | ICD-10-CM | POA: Insufficient documentation

## 2015-03-12 DIAGNOSIS — Z79899 Other long term (current) drug therapy: Secondary | ICD-10-CM | POA: Insufficient documentation

## 2015-03-12 DIAGNOSIS — Z853 Personal history of malignant neoplasm of breast: Secondary | ICD-10-CM | POA: Insufficient documentation

## 2015-03-12 DIAGNOSIS — E7800 Pure hypercholesterolemia, unspecified: Secondary | ICD-10-CM | POA: Insufficient documentation

## 2015-03-12 DIAGNOSIS — Z79891 Long term (current) use of opiate analgesic: Secondary | ICD-10-CM | POA: Insufficient documentation

## 2015-03-12 DIAGNOSIS — Z452 Encounter for adjustment and management of vascular access device: Secondary | ICD-10-CM | POA: Diagnosis not present

## 2015-03-12 DIAGNOSIS — I10 Essential (primary) hypertension: Secondary | ICD-10-CM | POA: Insufficient documentation

## 2015-03-12 HISTORY — PX: PORT-A-CATH REMOVAL: SHX5289

## 2015-03-12 SURGERY — REMOVAL PORT-A-CATH
Anesthesia: Monitor Anesthesia Care

## 2015-03-12 MED ORDER — LIDOCAINE HCL (CARDIAC) 20 MG/ML IV SOLN
INTRAVENOUS | Status: DC | PRN
  Administered 2015-03-12: 100 mg via INTRAVENOUS

## 2015-03-12 MED ORDER — DEXAMETHASONE SODIUM PHOSPHATE 10 MG/ML IJ SOLN
INTRAMUSCULAR | Status: AC
  Filled 2015-03-12: qty 1

## 2015-03-12 MED ORDER — PROPOFOL 10 MG/ML IV BOLUS
INTRAVENOUS | Status: AC
  Filled 2015-03-12: qty 20

## 2015-03-12 MED ORDER — LIDOCAINE HCL (CARDIAC) 20 MG/ML IV SOLN
INTRAVENOUS | Status: AC
  Filled 2015-03-12: qty 5

## 2015-03-12 MED ORDER — MIDAZOLAM HCL 2 MG/2ML IJ SOLN
INTRAMUSCULAR | Status: AC
  Filled 2015-03-12: qty 4

## 2015-03-12 MED ORDER — LIDOCAINE-EPINEPHRINE 1 %-1:100000 IJ SOLN
INTRAMUSCULAR | Status: AC
  Filled 2015-03-12: qty 1

## 2015-03-12 MED ORDER — ONDANSETRON HCL 4 MG/2ML IJ SOLN
INTRAMUSCULAR | Status: DC | PRN
  Administered 2015-03-12: 4 mg via INTRAVENOUS

## 2015-03-12 MED ORDER — LIDOCAINE-EPINEPHRINE 1 %-1:100000 IJ SOLN
INTRAMUSCULAR | Status: DC | PRN
  Administered 2015-03-12: 1.5 mL

## 2015-03-12 MED ORDER — ACETAMINOPHEN 650 MG RE SUPP
650.0000 mg | RECTAL | Status: DC | PRN
  Filled 2015-03-12: qty 1

## 2015-03-12 MED ORDER — 0.9 % SODIUM CHLORIDE (POUR BTL) OPTIME
TOPICAL | Status: DC | PRN
  Administered 2015-03-12: 1000 mL

## 2015-03-12 MED ORDER — BUPIVACAINE HCL (PF) 0.5 % IJ SOLN
INTRAMUSCULAR | Status: DC | PRN
  Administered 2015-03-12: 1.5 mL

## 2015-03-12 MED ORDER — ACETAMINOPHEN 325 MG PO TABS: 650.0000 mg | ORAL_TABLET | ORAL | Status: DC | PRN

## 2015-03-12 MED ORDER — LIDOCAINE HCL 1 % IJ SOLN
INTRAMUSCULAR | Status: AC
  Filled 2015-03-12: qty 20

## 2015-03-12 MED ORDER — ONDANSETRON HCL 4 MG/2ML IJ SOLN
INTRAMUSCULAR | Status: AC
  Filled 2015-03-12: qty 2

## 2015-03-12 MED ORDER — ONDANSETRON HCL 4 MG/2ML IJ SOLN: 4.0000 mg | Freq: Once | INTRAMUSCULAR | Status: DC | PRN

## 2015-03-12 MED ORDER — PROPOFOL 500 MG/50ML IV EMUL
INTRAVENOUS | Status: DC | PRN
  Administered 2015-03-12: 100 ug/kg/min via INTRAVENOUS

## 2015-03-12 MED ORDER — BUPIVACAINE HCL (PF) 0.5 % IJ SOLN
INTRAMUSCULAR | Status: AC
  Filled 2015-03-12: qty 30

## 2015-03-12 MED ORDER — DEXAMETHASONE SODIUM PHOSPHATE 10 MG/ML IJ SOLN
INTRAMUSCULAR | Status: DC | PRN
  Administered 2015-03-12: 10 mg via INTRAVENOUS

## 2015-03-12 MED ORDER — SODIUM CHLORIDE 0.9 % IJ SOLN: 3.0000 mL | INTRAMUSCULAR | Status: DC | PRN

## 2015-03-12 MED ORDER — MIDAZOLAM HCL 5 MG/5ML IJ SOLN
INTRAMUSCULAR | Status: DC | PRN
  Administered 2015-03-12: 2 mg via INTRAVENOUS

## 2015-03-12 MED ORDER — OXYCODONE HCL 5 MG PO TABS: 5.0000 mg | ORAL_TABLET | ORAL | Status: DC | PRN

## 2015-03-12 MED ORDER — FENTANYL CITRATE (PF) 100 MCG/2ML IJ SOLN
INTRAMUSCULAR | Status: AC
  Filled 2015-03-12: qty 4

## 2015-03-12 MED ORDER — CEFAZOLIN SODIUM-DEXTROSE 2-3 GM-% IV SOLR
INTRAVENOUS | Status: AC
  Filled 2015-03-12: qty 50

## 2015-03-12 MED ORDER — CEFAZOLIN SODIUM-DEXTROSE 2-3 GM-% IV SOLR
2.0000 g | INTRAVENOUS | Status: AC
  Administered 2015-03-12: 2 g via INTRAVENOUS

## 2015-03-12 MED ORDER — FENTANYL CITRATE (PF) 100 MCG/2ML IJ SOLN
INTRAMUSCULAR | Status: DC | PRN
  Administered 2015-03-12: 50 ug via INTRAVENOUS

## 2015-03-12 MED ORDER — BUPIVACAINE-EPINEPHRINE (PF) 0.5% -1:200000 IJ SOLN
INTRAMUSCULAR | Status: AC
  Filled 2015-03-12: qty 30

## 2015-03-12 MED ORDER — LACTATED RINGERS IV SOLN
INTRAVENOUS | Status: DC
  Administered 2015-03-12: 1000 mL via INTRAVENOUS

## 2015-03-12 MED ORDER — FENTANYL CITRATE (PF) 100 MCG/2ML IJ SOLN: 25.0000 ug | INTRAMUSCULAR | Status: DC | PRN

## 2015-03-12 SURGICAL SUPPLY — 35 items
APL SKNCLS STERI-STRIP NONHPOA (GAUZE/BANDAGES/DRESSINGS) ×1
BENZOIN TINCTURE PRP APPL 2/3 (GAUZE/BANDAGES/DRESSINGS) ×2 IMPLANT
BLADE HEX COATED 2.75 (ELECTRODE) ×2 IMPLANT
BLADE SURG 15 STRL LF DISP TIS (BLADE) ×1 IMPLANT
BLADE SURG 15 STRL SS (BLADE) ×2
COVER SURGICAL LIGHT HANDLE (MISCELLANEOUS) ×2 IMPLANT
DECANTER SPIKE VIAL GLASS SM (MISCELLANEOUS) ×2 IMPLANT
DRAPE LAPAROTOMY TRNSV 102X78 (DRAPE) ×2 IMPLANT
DRSG TEGADERM 4X4.75 (GAUZE/BANDAGES/DRESSINGS) ×2 IMPLANT
DRSG TEGADERM 6X8 (GAUZE/BANDAGES/DRESSINGS) IMPLANT
ELECT PENCIL ROCKER SW 15FT (MISCELLANEOUS) ×2 IMPLANT
ELECT REM PT RETURN 9FT ADLT (ELECTROSURGICAL) ×2
ELECTRODE REM PT RTRN 9FT ADLT (ELECTROSURGICAL) ×1 IMPLANT
GAUZE SPONGE 4X4 12PLY STRL (GAUZE/BANDAGES/DRESSINGS) ×1 IMPLANT
GAUZE SPONGE 4X4 16PLY XRAY LF (GAUZE/BANDAGES/DRESSINGS) ×2 IMPLANT
GLOVE BIOGEL PI IND STRL 7.0 (GLOVE) ×1 IMPLANT
GLOVE BIOGEL PI INDICATOR 7.0 (GLOVE) ×1
GLOVE ECLIPSE 8.0 STRL XLNG CF (GLOVE) ×2 IMPLANT
GLOVE INDICATOR 8.0 STRL GRN (GLOVE) ×2 IMPLANT
GOWN STRL REUS W/TWL XL LVL3 (GOWN DISPOSABLE) ×4 IMPLANT
KIT BASIN OR (CUSTOM PROCEDURE TRAY) ×2 IMPLANT
NDL HYPO 25X1 1.5 SAFETY (NEEDLE) IMPLANT
NEEDLE HYPO 22GX1.5 SAFETY (NEEDLE) IMPLANT
NEEDLE HYPO 25X1 1.5 SAFETY (NEEDLE) ×2 IMPLANT
NS IRRIG 1000ML POUR BTL (IV SOLUTION) ×2 IMPLANT
PACK BASIC VI WITH GOWN DISP (CUSTOM PROCEDURE TRAY) ×2 IMPLANT
STRIP CLOSURE SKIN 1/2X4 (GAUZE/BANDAGES/DRESSINGS) ×2 IMPLANT
SUT MNCRL AB 4-0 PS2 18 (SUTURE) ×2 IMPLANT
SUT VIC AB 2-0 SH 27 (SUTURE) ×2
SUT VIC AB 2-0 SH 27X BRD (SUTURE) ×1 IMPLANT
SUT VIC AB 3-0 SH 27 (SUTURE) ×2
SUT VIC AB 3-0 SH 27XBRD (SUTURE) ×1 IMPLANT
SYR BULB IRRIGATION 50ML (SYRINGE) ×2 IMPLANT
SYR CONTROL 10ML LL (SYRINGE) ×2 IMPLANT
TOWEL OR 17X26 10 PK STRL BLUE (TOWEL DISPOSABLE) ×2 IMPLANT

## 2015-03-12 NOTE — H&P (Signed)
Mckenzie Sanford is an 59 y.o. female.   Chief Complaint:   Here for elective Port-A-Cath removal HPI:  She has completed her chemotherapy or breast cancer and no longer needs her Port-A-Cath. She now presents for removal.  Past Medical History  Diagnosis Date  . HTN (hypertension)   . Allergic rhinitis   . Hypercholesteremia   . IBS (irritable bowel syndrome)   . Breast cancer ([place]) 03/12/14    right/invasive ductal carcinoma, DCIS  . Wears glasses   . Depression   . Radiation 11/23/14-01/07/15    Right breast  . Arthritis     in back  . Difficulty sleeping     takes Lorazepam as needed  . Anxiety     Past Surgical History  Procedure Laterality Date  . Dilation and curettage of uterus    . Cyst excision      right axilla  . Colonoscopy    . Tonsillectomy    . Radioactive seed guided mastectomy with axillary sentinel lymph node biopsy Right 04/20/2014    Procedure: RIGHT BREAST RADIOACTIVE SEED GUIDED LUMPECTOMY WITH RIGHT AXILLARY SENTINEL LYMPH NODE BIOPSY;  Surgeon: [name], MD;  Location: [facility];  Service: General;  Laterality: Right;  . Portacath placement Right 06/01/2014    Procedure: ULTRASOUND GUIDED INSERTION PORT-A-CATH;  Surgeon: [name], MD;  Location: [facility];  Service: General;  Laterality: Right;  . Breast surgery  11/15    lumpectomy RT breast    Family History  Problem Relation Age of Onset  . Heart attack Father    Social History:  reports that she has never smoked. She has never used smokeless tobacco. She reports that she drinks alcohol. She reports that she does not use illicit drugs.  Allergies:  Allergies  Allergen Reactions  . Shellfish Allergy Swelling  . Ivp Dye [Iodinated Diagnostic Agents]   . Penicillins Rash    Pt said this was a child hood allergy and cannot remember answers questions     Medications Prior to Admission  Medication Sig Dispense Refill  . amLODipine (NORVASC) 10 MG  tablet Take 10 mg by mouth at bedtime.     Marland Kitchen anastrozole (ARIMIDEX) 1 MG tablet Take 1 tablet (1 mg total) by mouth daily. 90 tablet 3  . Calcium Carb-Cholecalciferol (LIQUID CALCIUM WITH D3) 586 [id number] 6077 MG-UNIT CAPS Take 600 mg by mouth. Calcium 600 mg with 400 iu of D3    . diphenhydrAMINE (SOMINEX) 25 MG tablet Take 25 mg by mouth at bedtime as needed for itching or sleep.    Marland Kitchen HYDROcodone-acetaminophen (NORCO/VICODIN) 5-325 MG per tablet Take 1-2 tablets by mouth every 4 (four) hours as needed for moderate pain or severe pain. 30 tablet 0  . irbesartan (AVAPRO) 150 MG tablet Take 150 mg by mouth daily.     Marland Kitchen LINZESS 290 MCG CAPS capsule Take 290 mcg by mouth daily as needed.  11  . LORazepam (ATIVAN) 0.5 MG tablet Take 1 tablet (0.5 mg total) by mouth at bedtime. 30 tablet 0  . metoprolol succinate (TOPROL-XL) 50 MG 24 hr tablet Take 50 mg by mouth every morning. Take with or immediately following a meal.    . Multiple Vitamins-Minerals (MULTIVITAMIN ADULT PO) Take 1 tablet by mouth daily.     Vladimir Faster Glycol-Propyl Glycol (SYSTANE OP) Apply 1 drop to eye daily as needed (Dry eyes).    . sertraline (ZOLOFT) 25 MG tablet Take 25 mg by mouth daily.   11  .  simvastatin (ZOCOR) 40 MG tablet Take 40 mg by mouth every evening.      Results for orders placed or performed during the hospital encounter of 03/11/15 (from the past 48 hour(s))  Protime-INR     Status: None   Collection Time: 03/11/15  3:15 PM  Result Value Ref Range   Prothrombin Time 12.6 11.6 - 15.2 seconds   INR 0.92 0.00 - 1.49   No results found.  ROS  Blood pressure 121/68, pulse 63, temperature 98.2 F (36.8 C), temperature source Oral, resp. rate 16, height 5\' 2"  (1.575 m), weight 56.473 kg (124 lb 8 oz), last menstrual period 09/26/2011, SpO2 99 %. Physical Exam  Constitutional: She appears well-developed and well-nourished. No distress.  HENT:  Head: Normocephalic and atraumatic.  Respiratory:  Right upper chest  wall scar with palpable port.     Assessment/Plan Port-A-Cath in place. This is no longer lead.  Plan: Port-A-Cath removal.The procedure risks and aftercare been explained. Risks include but are not limited to bleeding, infection, wound problems.  Gonsalo Cuthbertson J 03/12/2015, 11:12 AM

## 2015-03-12 NOTE — Anesthesia Preprocedure Evaluation (Signed)
Anesthesia Evaluation  Patient identified by MRN, date of birth, ID band Patient awake    Reviewed: Allergy & Precautions, H&P , NPO status , Patient's Chart, lab work & pertinent test results  History of Anesthesia Complications (+) DIFFICULT IV STICK / SPECIAL LINEHistory of anesthetic complications: Right arm precautions.  Airway Mallampati: II  TM Distance: >3 FB Neck ROM: Full    Dental  (+) Teeth Intact, Dental Advisory Given   Pulmonary neg pulmonary ROS,    Pulmonary exam normal        Cardiovascular hypertension, Pt. on medications and Pt. on home beta blockers Normal cardiovascular exam     Neuro/Psych Depression negative neurological ROS     GI/Hepatic negative GI ROS, Neg liver ROS,   Endo/Other  negative endocrine ROS  Renal/GU negative Renal ROS     Musculoskeletal negative musculoskeletal ROS (+)   Abdominal   Peds  Hematology negative hematology ROS (+)   Anesthesia Other Findings   Reproductive/Obstetrics                             Anesthesia Physical  Anesthesia Plan  ASA: II  Anesthesia Plan: MAC   Post-op Pain Management:    Induction: Intravenous  Airway Management Planned: Simple Face Mask  Additional Equipment:   Intra-op Plan:   Post-operative Plan: Extubation in OR  Informed Consent: I have reviewed the patients History and Physical, chart, labs and discussed the procedure including the risks, benefits and alternatives for the proposed anesthesia with the patient or authorized representative who has indicated his/her understanding and acceptance.   Dental advisory given  Plan Discussed with: CRNA, Surgeon and Anesthesiologist  Anesthesia Plan Comments:         Anesthesia Quick Evaluation

## 2015-03-12 NOTE — Op Note (Signed)
OPERATIVE NOTE-  PREOPERATIVE DIAGNOSIS:  Retained Port-a-cath.  POSTOPERATIVE DIAGNOSIS:  Same  PROCEDURE:    Porta-cath removal  SURGEON:  [name], M.D.  ANESTHESIA:  Local (Xylocaine and Marcaine mix) with MAC  INDICATION:  This is a 59 year old female with breast cancer who has received chemotherapy via the Porta-cath.  The Porta-cath is no longer needed.  She now presents for removal.  The procedure, risks, and aftercare have been explained preoperatively.   She was brought to the OR, placed supine on the table and intravenous sedation was given.The right upper chest wall and neck were sterilely prepped and draped. Local anesthetic was infiltrated at the site of the port in the chest wall superficially and deep. The previous scar was incised sharply and then using electrocautery the subcutaneous tissue was divided until the port and catheter were identified.  The fibrous sheath was dissected free from the catheter and it was pulled out of the internal jugular vein. Direct pressure was held over the vein site for 10-15 minutes.  The port was then dissected free from the chest wall using electrocautery. The port and catheter were removed intact. The chest wall area was inspected and bleeding controlled with electrocautery. Once hemostasis was adequate, the chest wall incision was closed in 2 layers. The subcutaneous tissues approximated with running 3-0 Vicryl suture. The skin was closed with a running 4-0 Monocryl subcuticular stitch. Steri-Strips and a sterile dressing were applied.  She tolerated the procedure well without any apparent complications and was taken to the recovery room in satisfactory condition.

## 2015-03-12 NOTE — Anesthesia Postprocedure Evaluation (Signed)
  Anesthesia Post-op Note  Patient: Mckenzie Sanford  Procedure(s) Performed: Procedure(s) (LRB): REMOVAL PORT-A-CATH (N/A)  Patient Location: PACU  Anesthesia Type: MAC  Level of Consciousness: awake and alert   Airway and Oxygen Therapy: Patient Spontanous Breathing  Post-op Pain: mild  Post-op Assessment: Post-op Vital signs reviewed, Patient's Cardiovascular Status Stable, Respiratory Function Stable, Patent Airway and No signs of Nausea or vomiting  Last Vitals:  Filed Vitals:   03/12/15 1152  BP: 114/63  Pulse: 60  Temp: 36.4 C  Resp: 12    Post-op Vital Signs: stable   Complications: No apparent anesthesia complications

## 2015-03-12 NOTE — Discharge Instructions (Addendum)
Light activities with right arm today and the next two days, then resume usual activities as tolerated.  Sit upright for 6 hours once you get home.  Apply ice to the area the next two days.  May shower.  Remove bandage in 3 days.  Let steri strips fall off by themselves.  Call for heavy bleeding or wound problems.  Appointment in 2 months.  Please call the office [phone]) to make appt.    Moderate Conscious Sedation, Adult, Care After Refer to this sheet in the next few weeks. These instructions provide you with information on caring for yourself after your procedure. Your health care provider may also give you more specific instructions. Your treatment has been planned according to current medical practices, but problems sometimes occur. Call your health care provider if you have any problems or questions after your procedure. WHAT TO EXPECT AFTER THE PROCEDURE  After your procedure:  You may feel sleepy, clumsy, and have poor balance for several hours.  Vomiting may occur if you eat too soon after the procedure. HOME CARE INSTRUCTIONS  Do not participate in any activities where you could become injured for at least 24 hours. Do not:  Drive.  Swim.  Ride a bicycle.  Operate heavy machinery.  Cook.  Use power tools.  Climb ladders.  Work from a high place.  Do not make important decisions or sign legal documents until you are improved.  If you vomit, drink water, juice, or soup when you can drink without vomiting. Make sure you have little or no nausea before eating solid foods.  Only take over-the-counter or prescription medicines for pain, discomfort, or fever as directed by your health care provider.  Make sure you and your family fully understand everything about the medicines given to you, including what side effects may occur.  You should not drink alcohol, take sleeping pills, or take medicines that cause drowsiness for at least 24 hours.  If you  smoke, do not smoke without supervision.  If you are feeling better, you may resume normal activities 24 hours after you were sedated.  Keep all appointments with your health care provider. SEEK MEDICAL CARE IF:  Your skin is pale or bluish in color.  You continue to feel nauseous or vomit.  Your pain is getting worse and is not helped by medicine.  You have bleeding or swelling.  You are still sleepy or feeling clumsy after 24 hours. SEEK IMMEDIATE MEDICAL CARE IF:  You develop a rash.  You have difficulty breathing.  You develop any type of allergic problem.  You have a fever. MAKE SURE YOU:  Understand these instructions.  Will watch your condition.  Will get help right away if you are not doing well or get worse.   This information is not intended to replace advice given to you by your health care provider. Make sure you discuss any questions you have with your health care provider.   Document Released: 03/05/2013 Document Revised: 06/05/2014 Document Reviewed: 03/05/2013 Elsevier Interactive Patient Education [employer].

## 2015-03-12 NOTE — Transfer of Care (Signed)
Immediate Anesthesia Transfer of Care Note  Patient: Mckenzie Sanford  Procedure(s) Performed: Procedure(s): REMOVAL PORT-A-CATH (N/A)  Patient Location: PACU  Anesthesia Type:MAC  Level of Consciousness: sedated  Airway & Oxygen Therapy: Patient Spontanous Breathing and Patient connected to face mask oxygen  Post-op Assessment: Report given to RN and Post -op Vital signs reviewed and stable  Post vital signs: Reviewed and stable  Last Vitals:  Filed Vitals:   03/12/15 0951  BP: 121/68  Pulse: 63  Temp: 36.8 C  Resp: 16    Complications: No apparent anesthesia complications

## 2015-03-15 ENCOUNTER — Encounter (HOSPITAL_COMMUNITY): Payer: Self-pay | Admitting: General Surgery

## 2015-04-01 ENCOUNTER — Encounter: Payer: Self-pay | Admitting: Nurse Practitioner

## 2015-04-01 ENCOUNTER — Ambulatory Visit (HOSPITAL_BASED_OUTPATIENT_CLINIC_OR_DEPARTMENT_OTHER): Payer: 59 | Admitting: Nurse Practitioner

## 2015-04-01 VITALS — BP 132/67 | HR 67 | Temp 98.4°F | Resp 18 | Ht 62.0 in | Wt 127.5 lb

## 2015-04-01 DIAGNOSIS — C50411 Malignant neoplasm of upper-outer quadrant of right female breast: Secondary | ICD-10-CM | POA: Diagnosis not present

## 2015-04-01 DIAGNOSIS — R200 Anesthesia of skin: Secondary | ICD-10-CM

## 2015-04-01 DIAGNOSIS — M545 Low back pain: Secondary | ICD-10-CM

## 2015-04-01 DIAGNOSIS — R5383 Other fatigue: Secondary | ICD-10-CM

## 2015-04-01 NOTE — Progress Notes (Signed)
CLINIC:  Cancer Survivorship   REASON FOR VISIT:  Routine follow-up post-treatment for a recent history of breast cancer.  BRIEF ONCOLOGIC HISTORY:    Breast cancer of upper-outer quadrant of right female breast (Mckenzie Sanford)   03/10/2014 Mammogram Right breast: mass   03/10/2014 Breast US Right breast: 1 cm irregular mass, UOQ, anterior depth, hypoechoic with posterior acoustic shadowing.  LN with focal cortex thickening in right axilla.   03/12/2014 Initial Biopsy Right breast core needle bx: Invasive ductal carcinoma with DCIS, right axillary lymph node biopsy negative, ER+ (100%), PR+ (84%), Ki-67 16%, HER-2 negative (ratio 1.22)   03/20/2014 Breast MRI Right breast UOQ: 2.6 x 1.9 x 1.7 cm invasive ductal carcinoma with extension and involvement of the adjacent skin and nipple area complex, no abnormal lymph nodes   03/21/2014 Clinical Stage Stage IA (T1b, N0, cM0)   04/20/2014 Definitive Surgery Right breast lumpectomy/SLNB([name]): IDC grade 2; 2.5 cm with intermediate to high-grade DCIS margins negative: 1/3 positive sentinel lymph node with extracapsular extension, ER 100%, PR 84%, HER-2 negative ratio 1.22, Ki-67 16%   04/20/2014 Pathologic Stage  Stage IIB: pT2, pN1a, cM0    06/04/2014 - 10/22/2014 Adjuvant Chemotherapy Dose dense doxorubicin and cyclophosphamide x 4 cycles followed by paclitaxel x 1 then nab-paclitaxel x 11 (total 12 cycles)   11/23/2014 - 01/07/2015 Radiation Therapy Adjuvant RT [name]): Right breast / 45 Gray over 25 fractions; Right breast boost / 16 Gray over 8 fractions   02/17/2015 -  Anti-estrogen oral therapy Anastrozole 1 mg daily. Planned duration of therapy: 5 years    INTERVAL HISTORY:  Mckenzie Sanford presents to the [facility] today for our initial meeting to review her survivorship care plan detailing her treatment course for breast cancer, as well as monitoring long-term side effects of that treatment, education regarding health maintenance,  screening, and overall wellness and health promotion.     Overall, Mckenzie Sanford reports feeling quite well since completing her radiation therapy approximately two months ago.  She reports that her skin changes overlying her right breast have almost completely resolved and her fatigue is getting better daily. 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 She does report some numbness in her hands for the last week. She states that is worse upon awakening and it occurs intermittently. She has had some mild hot flashes with the initiation of her anastrozole but they are bearable. She has some difficulty sleeping but states the hot flashes do not awaken her at night. She has chronic itching of her breast to take her Zyrtec and Benadryl as directed by Dr. [name]. She has some back pain which predated her cancer diagnosis. This is no worse. She denies any mass or nodularity about either breast. She reports a good appetite and denies any weight loss.  REVIEW OF SYSTEMS:  General: Fatigue and hot flashes as above. Denies fever, chills, or night sweats. HEENT: Some visual changes following chemotherapy, which are improving. Denies hearing loss, mouth sores, or difficulty swallowing. Cardiac: Denies palpitations, chest pain, and lower extremity edema.  Respiratory: Denies cough, shortness of breath, and dyspnea on exertion.  GI: Denies abdominal pain, constipation, diarrhea, nausea, or vomiting.  GU: Denies dysuria, hematuria, vaginal bleeding, vaginal discharge, or vaginal dryness.  Musculoskeletal: Back pain as above, otherwise denies joint or bone pain.  Neuro: Peripheral neuropathy as above. Denies headache or recent falls.  Skin: Denies rash, pruritis, or open wounds.  Breast: Denies any new nodularity, masses, tenderness, nipple changes, or nipple  discharge.  Psych: Occasional confusion since chemotherapy. Denies depression or anxiety.  A 14-point review of systems was completed and was negative,  except as noted above.   ONCOLOGY TREATMENT TEAM:  1. Surgeon:  Dr. [name] at [facility]  2. Medical Oncologist: Dr. [name] 3. Radiation Oncologist: Dr. [name]    PAST MEDICAL/SURGICAL HISTORY:  Past Medical History  Diagnosis Date  . HTN (hypertension)   . Allergic rhinitis   . Hypercholesteremia   . IBS (irritable bowel syndrome)   . Breast cancer (Mckenzie Sanford) 03/12/14    right/invasive ductal carcinoma, DCIS  . Wears glasses   . Depression   . Radiation 11/23/14-01/07/15    Right breast  . Arthritis     in back  . Difficulty sleeping     takes Lorazepam as needed  . Anxiety    Past Surgical History  Procedure Laterality Date  . Dilation and curettage of uterus    . Cyst excision      right axilla  . Colonoscopy    . Tonsillectomy    . Radioactive seed guided mastectomy with axillary sentinel lymph node biopsy Right 04/20/2014    Procedure: RIGHT BREAST RADIOACTIVE SEED GUIDED LUMPECTOMY WITH RIGHT AXILLARY SENTINEL LYMPH NODE BIOPSY;  Surgeon: [name], MD;  Location: [facility];  Service: General;  Laterality: Right;  . Portacath placement Right 06/01/2014    Procedure: ULTRASOUND GUIDED INSERTION PORT-A-CATH;  Surgeon: [name], MD;  Location: [facility];  Service: General;  Laterality: Right;  . Breast surgery  11/15    lumpectomy RT breast  . Port-a-cath removal N/A 03/12/2015    Procedure: REMOVAL PORT-A-CATH;  Surgeon: [name], MD;  Location: [facility];  Service: General;  Laterality: N/A;     ALLERGIES:  Allergies  Allergen Reactions  . Shellfish Allergy Swelling  . Ivp Dye [Iodinated Diagnostic Agents]   . Penicillins Rash    Pt said this was a child hood allergy and cannot remember answers questions      CURRENT MEDICATIONS:  Current Outpatient Prescriptions on File Prior to Visit  Medication Sig Dispense Refill  . amLODipine (NORVASC) 10 MG tablet Take 10 mg by mouth at bedtime.     Marland Kitchen  anastrozole (ARIMIDEX) 1 MG tablet Take 1 tablet (1 mg total) by mouth daily. 90 tablet 3  . Calcium Carb-Cholecalciferol (LIQUID CALCIUM WITH D3) [phone] MG-UNIT CAPS Take 600 mg by mouth. Calcium 600 mg with 400 iu of D3    . diphenhydrAMINE (SOMINEX) 25 MG tablet Take 25 mg by mouth at bedtime as needed for itching or sleep.    Marland Kitchen HYDROcodone-acetaminophen (NORCO/VICODIN) 5-325 MG per tablet Take 1-2 tablets by mouth every 4 (four) hours as needed for moderate pain or severe pain. 30 tablet 0  . irbesartan (AVAPRO) 150 MG tablet Take 150 mg by mouth daily.     Marland Kitchen LINZESS 290 MCG CAPS capsule Take 290 mcg by mouth daily as needed.  11  . LORazepam (ATIVAN) 0.5 MG tablet Take 1 tablet (0.5 mg total) by mouth at bedtime. 30 tablet 0  . metoprolol succinate (TOPROL-XL) 50 MG 24 hr tablet Take 50 mg by mouth every morning. Take with or immediately following a meal.    . Multiple Vitamins-Minerals (MULTIVITAMIN ADULT PO) Take 1 tablet by mouth daily.     Vladimir Faster Glycol-Propyl Glycol (SYSTANE OP) Apply 1 drop to eye daily as needed (Dry eyes).    . sertraline (ZOLOFT) 25 MG tablet Take 25  mg by mouth daily.   11  . simvastatin (ZOCOR) 40 MG tablet Take 40 mg by mouth every evening.     No current facility-administered medications on file prior to visit.     ONCOLOGIC FAMILY HISTORY:  Family History  Problem Relation Age of Onset  . Heart attack Father      GENETIC COUNSELING/TESTING: None  SOCIAL HISTORY:  Mckenzie Sanford is divorced and lives alone in [place], [place].  She has no children.  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  She denies any current or history of tobacco or illicit drug use.  She has not used alcohol since her treatment began and when she does, only does so rarely.   PHYSICAL EXAMINATION:  Vital Signs:   Filed Vitals:   04/01/15 0925  BP: 132/67  Pulse: 67  Temp: 98.4 F (36.9 C)  Resp: 18    ECOG Performance status: 0 General: Well-nourished, well-appearing female in no acute distress.  She is unaccompanied in clinic today.   HEENT: Hair is regrowing. Head is atraumatic and normocephalic.  Pupils equal and reactive to light and accomodation. Conjunctivae clear without exudate.  Sclerae anicteric. Oral mucosa is pink, moist, and intact without lesions.  Oropharynx is pink without lesions or erythema.  Lymph: No cervical, supraclavicular, infraclavicular, or axillary lymphadenopathy noted on palpation.  Cardiovascular: Regular rate and rhythm without murmurs, rubs, or gallops. Respiratory: Clear to auscultation bilaterally. Chest expansion symmetric without accessory muscle use on inspiration or expiration.  GI: Abdomen soft and round. No tenderness to palpation. Bowel sounds normoactive in 4 quadrants.  GU: Deferred.  Neuro: No focal deficits. Steady gait.  Psych: Mood and affect normal and appropriate for situation.  Extremities: No edema, cyanosis, or clubbing.  Skin: Warm and dry. No open lesions noted.   LABORATORY DATA:  None for this visit.  DIAGNOSTIC IMAGING:  None for this visit.     ASSESSMENT AND PLAN:   1. 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 Mckenzie Sanford is doing well without clinical evidence of disease recurrence.  She will follow-up with her medical oncologist,  Dr. [name], in December 2016 with history and physical exam per surveillance protocol.  She will continue her anti-estrogen therapy with anastrozole as prescribed by  Dr. [name]. She was instructed to make Dr. [name] or myself aware if she begins to  experience any worsening side effects of the medication and I could see her back in clinic to help manage those side effects, as needed. A comprehensive survivorship care plan and treatment summary was reviewed with the patient today detailing her breast cancer diagnosis, treatment course, potential late/long-term effects of treatment, appropriate follow-up care with recommendations for the future, and patient education resources.  A copy of this summary, along with a letter will be sent to the patient's primary care provider via in basket message after today's visit.  Mckenzie Sanford is welcome to return to the Survivorship Clinic in the future, as needed; no follow-up will be scheduled at this time.    2. Bone health:  Given Mckenzie Sanford's age/history of breast cancer and her  current treatment regimen including anti-estrogen therapy with anastrozole, she is at risk for bone demineralization.  Per her report, she has not yet had a DEXA scan.  She will discuss this further with Dr. [name] at her upcoming appointment.  In the meantime, she was encouraged to increase her consumption of foods rich in calcium and vitamin D as well as to increase her weight-bearing activities.  She was given education on specific activities to promote bone health.  3. Cancer screening:  Due to Mckenzie Sanford history and her age, she should receive screening for skin cancers, colon cancer, and gynecologic cancers.  The information and recommendations are listed on the patient's comprehensive care plan/treatment summary and were reviewed in detail with the patient.    4. Health maintenance and wellness promotion: Mckenzie Sanford was encouraged to consume 5-7 servings of fruits and vegetables per day. We reviewed the "Nutrition Rainbow" handout and discussed recommenations including limiting red meat and processed foods, while increasing her intake of fruits and vegetables.  She was also encouraged to engage in moderate to vigorous exercise for 30  minutes per day most days of the week. We discussed the [employer] fitness program, which is designed for cancer survivors to help them become more physically fit after cancer treatments. She is already registered for this class and will begin it after the first of the year.  She was instructed to limit her alcohol consumption and continue to abstain from tobacco use.   5. Support services/counseling: It is not uncommon for this period of the patient's cancer care trajectory to be one of many emotions and stressors. Mckenzie Sanford reports her main concern is related to her finances, and she is already meeting with our Financial Advocates here at the [facility]. We discussed an opportunity for her to participate in the next session of [facility] ("Finding Your New Normal") support group series designed for patients after they have completed treatment.   Mckenzie Sanford was encouraged to take advantage of our many other support services programs, support groups, and/or counseling in coping with her new life as a cancer survivor after completing anti-cancer treatment.  She was offered support today through active listening and expressive supportive counseling.  She was given information regarding our available services and encouraged to contact me with any questions or for help enrolling in any of our support group/programs.    A total of 50 minutes of face-to-face time was spent with this patient with greater than 50% of that time in counseling and care-coordination.   [name], NP  Survivorship Program [facility] [phone]   Note: PRIMARY CARE PROVIDER Tivis Ringer, [place] [phone]

## 2015-04-05 ENCOUNTER — Encounter: Payer: Self-pay | Admitting: Internal Medicine

## 2015-05-13 ENCOUNTER — Other Ambulatory Visit: Payer: Self-pay | Admitting: *Deleted

## 2015-05-13 DIAGNOSIS — C50411 Malignant neoplasm of upper-outer quadrant of right female breast: Secondary | ICD-10-CM

## 2015-05-13 MED ORDER — HYDROCODONE-ACETAMINOPHEN 5-325 MG PO TABS: 1.0000 | ORAL_TABLET | ORAL | Status: DC | PRN

## 2015-05-18 ENCOUNTER — Encounter: Payer: Self-pay | Admitting: *Deleted

## 2015-05-18 ENCOUNTER — Telehealth: Payer: Self-pay | Admitting: *Deleted

## 2015-05-18 ENCOUNTER — Ambulatory Visit (HOSPITAL_BASED_OUTPATIENT_CLINIC_OR_DEPARTMENT_OTHER): Payer: 59 | Admitting: Hematology and Oncology

## 2015-05-18 ENCOUNTER — Encounter: Payer: Self-pay | Admitting: Hematology and Oncology

## 2015-05-18 VITALS — BP 99/72 | HR 68 | Temp 98.3°F | Resp 18 | Ht 62.0 in | Wt 127.4 lb

## 2015-05-18 DIAGNOSIS — I952 Hypotension due to drugs: Secondary | ICD-10-CM

## 2015-05-18 DIAGNOSIS — M545 Low back pain: Secondary | ICD-10-CM

## 2015-05-18 DIAGNOSIS — C50411 Malignant neoplasm of upper-outer quadrant of right female breast: Secondary | ICD-10-CM

## 2015-05-18 DIAGNOSIS — R5383 Other fatigue: Secondary | ICD-10-CM | POA: Insufficient documentation

## 2015-05-18 DIAGNOSIS — I10 Essential (primary) hypertension: Secondary | ICD-10-CM

## 2015-05-18 DIAGNOSIS — I959 Hypotension, unspecified: Secondary | ICD-10-CM | POA: Diagnosis not present

## 2015-05-18 DIAGNOSIS — T733XXA Exhaustion due to excessive exertion, initial encounter: Secondary | ICD-10-CM

## 2015-05-18 NOTE — Progress Notes (Signed)
Patient Care Team: [name], MD as PCP - General (Internal Medicine) [name], MD as Consulting Physician (General Surgery) [name], MD as Consulting Physician (Hematology and Oncology) [name], MD as Consulting Physician (Radiation Oncology) [name], MD as Consulting Physician (Obstetrics and Gynecology) [name], NP as Nurse Practitioner (Hematology and Oncology)  DIAGNOSIS: Breast cancer of upper-outer quadrant of right female breast [facility])   Staging form: Breast, AJCC 7th Edition     Clinical: Stage IA (T1b, N0, cM0) - Signed by Rulon Eisenmenger, MD on 03/20/2014     Pathologic: Stage IIB (T2, N1, cM0) - Unsigned   SUMMARY OF ONCOLOGIC HISTORY:   Breast cancer of upper-outer quadrant of right female breast ([place])   03/10/2014 Mammogram Right breast: mass   03/10/2014 Breast US Right breast: 1 cm irregular mass, UOQ, anterior depth, hypoechoic with posterior acoustic shadowing.  LN with focal cortex thickening in right axilla.   03/12/2014 Initial Biopsy Right breast core needle bx: Invasive ductal carcinoma with DCIS, right axillary lymph node biopsy negative, ER+ (100%), PR+ (84%), Ki-67 16%, HER-2 negative (ratio 1.22)   03/20/2014 Breast MRI Right breast UOQ: 2.6 x 1.9 x 1.7 cm invasive ductal carcinoma with extension and involvement of the adjacent skin and nipple area complex, no abnormal lymph nodes   03/21/2014 Clinical Stage Stage IA (T1b, N0, cM0)   04/20/2014 Definitive Surgery Right breast lumpectomy/SLNB([name]): IDC grade 2; 2.5 cm with intermediate to high-grade DCIS margins negative: 1/3 positive sentinel lymph node with extracapsular extension, ER 100%, PR 84%, HER-2 negative ratio 1.22, Ki-67 16%   04/20/2014 Pathologic Stage  Stage IIB: pT2, pN1a, cM0    06/04/2014 - 10/22/2014 Adjuvant Chemotherapy Dose dense doxorubicin and cyclophosphamide x 4 cycles followed by paclitaxel x 1 then nab-paclitaxel x 11 (total 12  cycles)   11/23/2014 - 01/07/2015 Radiation Therapy Adjuvant RT [name]): Right breast / 45 Gray over 25 fractions; Right breast boost / 16 Gray over 8 fractions   02/17/2015 -  Anti-estrogen oral therapy Anastrozole 1 mg daily. Planned duration of therapy: 5 years   04/01/2015 Survivorship Survivorship care plan completed and given to patient.    CHIEF COMPLIANT: Follow-up on anastrozole  INTERVAL HISTORY: Mckenzie Sanford is a 59 year old with above-mentioned history of right breast cancer currently on hormone therapy with anastrozole. She reports that she noticed muscle aches and pains throughout her body. Her low back pain can sometimes be as bad as 10 out of 10. She was recently prescribed pain medications to help with these aches and pains. She also has difficulty with falling asleep. However she reports a 3 hours after taking the pill she feels very drowsy and fatigued.  REVIEW OF SYSTEMS:   Constitutional: Denies fevers, chills or abnormal weight loss Eyes: Denies blurriness of vision Ears, nose, mouth, throat, and face: Denies mucositis or sore throat Respiratory: Denies cough, dyspnea or wheezes Cardiovascular: Denies palpitation, chest discomfort or lower extremity swelling Gastrointestinal:  Denies nausea, heartburn or change in bowel habits Skin: Denies abnormal skin rashes Lymphatics: Denies new lymphadenopathy or easy bruising Neurological: complains of tingling and numbness of hands and feet Behavioral/Psych: Mood is stable, no new changes  Breast:  denies any pain or lumps or nodules in either breasts All other systems were reviewed with the patient and are negative.  I have reviewed the past medical history, past surgical history, social history and family history with the patient and they are unchanged from previous note.  ALLERGIES:  is  allergic to shellfish allergy; ivp dye; and penicillins.  MEDICATIONS:  Current Outpatient Prescriptions  Medication Sig Dispense  Refill  . amLODipine (NORVASC) 10 MG tablet Take 10 mg by mouth at bedtime.     Marland Kitchen anastrozole (ARIMIDEX) 1 MG tablet Take 1 tablet (1 mg total) by mouth daily. 90 tablet 3  . Calcium Carb-Cholecalciferol (LIQUID CALCIUM WITH D3) [id number] MG-UNIT CAPS Take 600 mg by mouth. Calcium 600 mg with 400 iu of D3    . diphenhydrAMINE (SOMINEX) 25 MG tablet Take 25 mg by mouth at bedtime as needed for itching or sleep.    Marland Kitchen HYDROcodone-acetaminophen (NORCO/VICODIN) 5-325 MG tablet Take 1-2 tablets by mouth every 4 (four) hours as needed for moderate pain or severe pain. 30 tablet 0  . irbesartan (AVAPRO) 150 MG tablet Take 150 mg by mouth daily.     Marland Kitchen LINZESS 290 MCG CAPS capsule Take 290 mcg by mouth daily as needed.  11  . LORazepam (ATIVAN) 0.5 MG tablet Take 1 tablet (0.5 mg total) by mouth at bedtime. 30 tablet 0  . metoprolol succinate (TOPROL-XL) 50 MG 24 hr tablet Take 50 mg by mouth every morning. Take with or immediately following a meal.    . Polyethyl Glycol-Propyl Glycol (SYSTANE OP) Apply 1 drop to eye daily as needed (Dry eyes).    . sertraline (ZOLOFT) 25 MG tablet Take 25 mg by mouth daily.   11  . simvastatin (ZOCOR) 40 MG tablet Take 40 mg by mouth every evening.     No current facility-administered medications for this visit.    PHYSICAL EXAMINATION: ECOG PERFORMANCE STATUS: 1 - Symptomatic but completely ambulatory  Filed Vitals:   05/18/15 0922  BP: 99/72  Pulse: 68  Temp: 98.3 F (36.8 C)  Resp: 18   Filed Weights   05/18/15 0922  Weight: 127 lb 6.4 oz (57.788 kg)    GENERAL:alert, no distress and comfortable SKIN: skin color, texture, turgor are normal, no rashes or significant lesions EYES: normal, Conjunctiva are pink and non-injected, sclera clear OROPHARYNX:no exudate, no erythema and lips, buccal mucosa, and tongue normal  NECK: supple, thyroid normal size, non-tender, without nodularity LYMPH:  no palpable lymphadenopathy in the cervical, axillary or  inguinal LUNGS: clear to auscultation and percussion with normal breathing effort HEART: regular rate & rhythm and no murmurs and no lower extremity edema ABDOMEN:abdomen soft, non-tender and normal bowel sounds Musculoskeletal:no cyanosis of digits and no clubbing  NEURO: alert & oriented x 3 with fluent speech, grade 1-2 peripheral neuropathy  LABORATORY DATA:  I have reviewed the data as listed   Chemistry      Component Value Date/Time   NA 142 02/17/2015 1453   NA 140 09/10/2014 0909   K 3.6 02/17/2015 1453   K 3.6 09/10/2014 0909   CL 105 09/10/2014 0909   CO2 29 02/17/2015 1453   CO2 28 09/10/2014 0909   BUN 15.7 02/17/2015 1453   BUN 7 09/10/2014 0909   CREATININE 0.7 02/17/2015 1453   CREATININE 0.65 09/10/2014 0909   CREATININE 0.72 12/11/2011 1238      Component Value Date/Time   CALCIUM 10.0 02/17/2015 1453   CALCIUM 10.1 09/10/2014 0909   ALKPHOS 151* 02/17/2015 1453   ALKPHOS 142* 09/10/2014 0909   AST 28 02/17/2015 1453   AST 31 09/10/2014 0909   ALT 38 02/17/2015 1453   ALT 41* 09/10/2014 0909   BILITOT <0.30 02/17/2015 1453   BILITOT 0.3 09/10/2014 0909  Lab Results  Component Value Date   WBC 4.6 02/17/2015   HGB 13.5 02/17/2015   HCT 39.3 02/17/2015   MCV 88.7 02/17/2015   PLT 277 02/17/2015   NEUTROABS 2.8 02/17/2015   ASSESSMENT & PLAN:  Breast cancer of upper-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  Current treatment: anastrozole 1 mg daily 5 years started 02/16/2015  Anastrozole toxicities: 1. Diffuse  muscle aches and pains and spasms 2. Severe fatigue usually starts 3 hours after taking the medication I discussed about switching the medication to bedtime. We also discussed that she could take tonic water at bedtime to help with her muscle aches and pains. If the symptoms do not get better with these maneuvers, we might consider switching her from anastrozole to letrozole or exemestane.  BRCA1 positive: Patient will need oophorectomy  Hypotension: I reviewed her medications. I asked her to discontinue amlodipine and see Dr. Dagmar Hait her primary care physician. Her tiredness could be related to low blood pressure rather than anything else.  Return to clinic in 6 months for follow-up   No orders of the defined types were placed in this encounter.   The patient has a good understanding of the overall plan. she agrees with it. she will call with any problems that may develop before the next visit here.   Rulon Eisenmenger, MD 05/18/2015

## 2015-05-18 NOTE — Telephone Encounter (Signed)
Called and left message for Amy Dr. Danna Hefty CMA that patient's BP was 99/72 and patient advised to stop Norvasc as she is very tired and the BP is much lower than usual. Dr. [name] would like Dr. Dagmar Hait to f/u with patient.

## 2015-05-18 NOTE — Assessment & Plan Note (Signed)
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  Current treatment: anastrozole 1 mg daily 5 years started 02/16/2015  Anastrozole toxicities: BRCA1 positive: Patient will need oophorectomy  Return to clinic in 6 months for follow-up

## 2015-05-19 ENCOUNTER — Telehealth: Payer: Self-pay | Admitting: Hematology and Oncology

## 2015-05-19 NOTE — Telephone Encounter (Signed)
Called and left a message with 07/2015 appointment

## 2015-07-02 ENCOUNTER — Other Ambulatory Visit: Payer: Self-pay | Admitting: *Deleted

## 2015-07-02 DIAGNOSIS — C50411 Malignant neoplasm of upper-outer quadrant of right female breast: Secondary | ICD-10-CM

## 2015-07-02 MED ORDER — HYDROCODONE-ACETAMINOPHEN 5-325 MG PO TABS: 1.0000 | ORAL_TABLET | ORAL | Status: DC | PRN

## 2015-07-07 ENCOUNTER — Ambulatory Visit: Payer: 59 | Admitting: Physical Therapy

## 2015-07-21 ENCOUNTER — Ambulatory Visit: Payer: 59 | Attending: Hematology and Oncology | Admitting: Physical Therapy

## 2015-07-21 DIAGNOSIS — M2560 Stiffness of unspecified joint, not elsewhere classified: Secondary | ICD-10-CM

## 2015-07-21 DIAGNOSIS — M629 Disorder of muscle, unspecified: Secondary | ICD-10-CM | POA: Diagnosis present

## 2015-07-21 DIAGNOSIS — M545 Low back pain, unspecified: Secondary | ICD-10-CM

## 2015-07-21 DIAGNOSIS — M2569 Stiffness of other specified joint, not elsewhere classified: Secondary | ICD-10-CM

## 2015-07-21 DIAGNOSIS — IMO0002 Reserved for concepts with insufficient information to code with codable children: Secondary | ICD-10-CM

## 2015-07-21 DIAGNOSIS — R29898 Other symptoms and signs involving the musculoskeletal system: Secondary | ICD-10-CM | POA: Diagnosis present

## 2015-07-21 DIAGNOSIS — M25611 Stiffness of right shoulder, not elsewhere classified: Secondary | ICD-10-CM | POA: Insufficient documentation

## 2015-07-21 DIAGNOSIS — R0789 Other chest pain: Secondary | ICD-10-CM | POA: Insufficient documentation

## 2015-07-21 NOTE — Therapy (Signed)
[place], [place], [address] Phone: [phone]   Fax:  [phone]  Physical Therapy Evaluation  Patient Details  Name: Mckenzie Sanford MRN: [id number] Date of Birth: August 05, 1955 Referring Provider: Dr. [name]  Encounter Date: 07/21/2015      PT End of Session - 07/21/15 2140    Visit Number 1   Number of Visits 9   Date for PT Re-Evaluation 08/20/15   PT Start Time 1350   PT Stop Time 1438   PT Time Calculation (min) 48 min   Activity Tolerance Patient tolerated treatment well   Behavior During Therapy [facility] for tasks assessed/performed      Past Medical History  Diagnosis Date  . HTN (hypertension)   . Allergic rhinitis   . Hypercholesteremia   . IBS (irritable bowel syndrome)   . Breast cancer ([place]) 03/12/14    right/invasive ductal carcinoma, DCIS  . Wears glasses   . Depression   . Radiation 11/23/14-01/07/15    Right breast  . Arthritis     in back  . Difficulty sleeping     takes Lorazepam as needed  . Anxiety     Past Surgical History  Procedure Laterality Date  . Dilation and curettage of uterus    . Cyst excision      right axilla  . Colonoscopy    . Tonsillectomy    . Radioactive seed guided mastectomy with axillary sentinel lymph node biopsy Right 04/20/2014    Procedure: RIGHT BREAST RADIOACTIVE SEED GUIDED LUMPECTOMY WITH RIGHT AXILLARY SENTINEL LYMPH NODE BIOPSY;  Surgeon: [name], MD;  Location: [place];  Service: General;  Laterality: Right;  . Portacath placement Right 06/01/2014    Procedure: ULTRASOUND GUIDED INSERTION PORT-A-CATH;  Surgeon: [name], MD;  Location: [place];  Service: General;  Laterality: Right;  . Breast surgery  11/15    lumpectomy RT breast  . Port-a-cath removal N/A 03/12/2015    Procedure: REMOVAL PORT-A-CATH;  Surgeon: [name], MD;  Location: [facility];  Service: General;   Laterality: N/A;    There were no vitals filed for this visit.  Visit Diagnosis:  Stiffness of joint, shoulder region, right - Plan: PT plan of care cert/re-cert  Bilateral low back pain without sciatica - Plan: PT plan of care cert/re-cert  Decreased ROM of trunk and back - Plan: PT plan of care cert/re-cert  Muscle, ligament, or fascia disorder - Plan: PT plan of care cert/re-cert  Musculoskeletal chest pain      Subjective Assessment - 07/21/15 1402    Subjective Having pains at incision and at her back.   Pertinent History Right breast cancer with lumpectomy 04/20/14 with 3 lymph nodes removed.  Completed chemotherapy and radiation 01/07/15.  Went back to work 11/02/14.  HTN controlled with meds but still variable.  Hyperlipidemia.     Patient Stated Goals less pain on right side of chest and back   Currently in Pain? 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 [name]   Onset Date/Surgical Date 04/20/14   Precautions   Precautions Other (comment)   Precaution Comments cancer precautions   Restrictions   Weight Bearing Restrictions No   Balance Screen   Has the patient fallen in the past 6 months Yes   How many times? 1  was really dizzy and back gave out,  January 2017   Has the patient had a decrease in activity level because of a fear of falling?  No   Is the patient reluctant to leave their home because of a fear of falling?  No   Home Environment   Living Environment Private residence   Living Arrangements Other relatives  sister   Type of [place] to enter   Entrance Stairs-Number of Steps two   [place] One level   Prior Function   Level of Independence Independent with basic ADLs;Independent with homemaking with ambulation;Independent with gait   Vocation Part time employment   Vocation Requirements teaches preschool   Cognition   Overall Cognitive Status Within Functional Limits for tasks assessed   Observation/Other Assessments   Skin Integrity incisions at right axilla, Portacath site and across nipple area are well-healed; skin mobility is limited in all these areas   Quick DASH  54.55   Posture/Postural Control   Posture/Postural Control No significant limitations   AROM   Overall AROM  Other (comment)   AROM Assessment Site Lumbar  both shoulders grossly WFL but pain with full flexion and IR   Lumbar Flexion in standing, reaches 10 inches fingertips to floor  back is very flat   Lumbar Extension WFL  feels stiff, moves slowly   Lumbar - Right Side Bend 10% loss   Lumbar - Left Side Bend 10% loss   Lumbar - Right Rotation WFL   Lumbar - Left Rotation 20% loss   Special Tests    Special Tests Lumbar   Lumbar Tests other;other2   other   Findings Positive  for relief   Side  --  central   Comments repeated flexion in standing x 10 decreases pain from 8/10 to 6/10 and feels looser   other   Findings Positive  for relief   Comment repeated extension in lying  decreased pain further to 4-5/10           LYMPHEDEMA/ONCOLOGY QUESTIONNAIRE - 07/21/15 1417    Type   Cancer Type right breast cancer   Surgeries   Lumpectomy Date 04/20/14   Treatment   Past Chemotherapy Treatment Yes    Past Radiation Treatment Yes           Quick Dash - 07/21/15 0001    Open a tight or new jar Severe difficulty   Do heavy household chores (wash walls, wash floors) Severe difficulty   Carry a shopping bag or briefcase Mild difficulty   Wash your back Moderate difficulty   Use a knife to cut food No difficulty   Recreational activities in which you take some force or impact through your arm, shoulder,  or hand (golf, hammering, tennis) Moderate difficulty   During the past week, to what extent has your arm, shoulder or hand problem interfered with your normal social activities with family, friends, neighbors, or groups? Modererately   During the past week, to what extent has your arm, shoulder or hand problem limited your work or other regular daily activities Modererately   Arm, shoulder, or hand pain. Severe   Tingling (pins and needles) in your arm, shoulder, or hand Severe   Difficulty Sleeping Severe difficulty   DASH Score 54.55 %                        Short Term Clinic Goals - 07/21/15 2156    CC Short Term Goal  #1   Title Low back pain reduced at least 25%.   Baseline 9/10   Time 2   Period Weeks   Status New   CC Short Term Goal  #2   Title right chest pain reduced at least 25%.   Baseline 6/10   Time 2   Period Weeks   Status New             Long Term Clinic Goals - 07/21/15 2153    CC Long Term Goal  #1   Title Patient will report at least 75% decrease in low back pain.   Baseline 9/10   Time 4   Period Weeks   Status New   CC Long Term Goal  #2   Title Patient will report at least 50% decrease in right chest area pain.   Baseline 6/10   Time 4   Period Weeks   Status New   CC Long Term Goal  #3   Title Independent in HEP for low back flexibility and chest stretches.   Time 4   Period Weeks   Status New   CC Long Term Goal  #4   Title Trunk AROM limited no more than 10% any direction.   Time 4   Period Weeks   Status New             Plan - 07/21/15 2141    Clinical Impression Statement Patient with h/o right breast cancer with lumpectomy, chemotherapy and radiation.  Also with HTN and hyperlipidemia.  Patient with significant right chest and bilateral low back pain.  Soft tissue of chest shows decreased mobility from scars and from radiation fibrosis; low back flexibility is also limited.     Pt will benefit from skilled therapeutic intervention in order to improve on the following deficits Decreased range of motion;Decreased scar mobility;Pain   Rehab Potential Excellent   PT Frequency 2x / week   PT Duration 4 weeks   PT Treatment/Interventions Therapeutic exercise;Manual techniques;Passive range of motion;Scar mobilization;Moist Heat;Electrical Stimulation;Patient/family education;ADLs/Self Care Home Management;Cryotherapy;Taping   PT Next Visit Plan Begin low back flexibility exercise (all directions) and HEP.  Chest area myofascial release and stretching including HEP.   Consulted and Agree with Plan of Care Patient         Problem List Patient Active Problem List   Diagnosis Date Noted  . Hypotension 05/18/2015  . Fatigue 05/18/2015  . Nausea with vomiting 07/23/2014  . Dehydration 07/23/2014  . GERD (gastroesophageal reflux disease) 07/23/2014  . Neutropenia ([place]) 07/23/2014  . Breast cancer of upper-outer quadrant of right female breast ([place]) 03/20/2014  . Palpitations 03/26/2013  . Hypertension 07/02/2011  . Hyperlipidemia 07/02/2011  . CONSTIPATION 01/22/2009  . FLATULENCE-GAS-BLOATING  01/22/2009    Sanford,Mckenzie 07/21/2015, 10:02 PM  [place], [place], [address] Phone: [phone]   Fax:  [phone]  Name: Mckenzie Sanford MRN: [id number] Date of Birth: [birth year]   Serafina Royals, PT 07/21/2015 10:02 PM

## 2015-07-28 ENCOUNTER — Encounter: Payer: Self-pay | Admitting: *Deleted

## 2015-07-28 ENCOUNTER — Ambulatory Visit: Payer: 59 | Attending: Hematology and Oncology

## 2015-07-28 ENCOUNTER — Ambulatory Visit: Payer: 59

## 2015-07-28 DIAGNOSIS — M629 Disorder of muscle, unspecified: Secondary | ICD-10-CM | POA: Insufficient documentation

## 2015-07-28 DIAGNOSIS — R29898 Other symptoms and signs involving the musculoskeletal system: Secondary | ICD-10-CM | POA: Insufficient documentation

## 2015-07-28 DIAGNOSIS — M2569 Stiffness of other specified joint, not elsewhere classified: Secondary | ICD-10-CM

## 2015-07-28 DIAGNOSIS — R0789 Other chest pain: Secondary | ICD-10-CM | POA: Diagnosis present

## 2015-07-28 DIAGNOSIS — M25611 Stiffness of right shoulder, not elsewhere classified: Secondary | ICD-10-CM | POA: Diagnosis not present

## 2015-07-28 DIAGNOSIS — M545 Low back pain, unspecified: Secondary | ICD-10-CM

## 2015-07-28 DIAGNOSIS — M2560 Stiffness of unspecified joint, not elsewhere classified: Secondary | ICD-10-CM

## 2015-07-28 DIAGNOSIS — IMO0002 Reserved for concepts with insufficient information to code with codable children: Secondary | ICD-10-CM

## 2015-07-28 NOTE — Progress Notes (Signed)
mammo report received from [facility], reviewed by Dr. Gudena, sent to scan. 

## 2015-07-28 NOTE — Patient Instructions (Signed)
Cancer Rehab [phone]  Supine Knee to Chest    Lie on back. Gently pull one knee toward chest. Hold _20__ seconds.  Repeat _3__ times per session. Do _2-3__ sessions per day.  Copyright  VHI. All rights reserved.   Double Knee to Chest (Flexion)    Gently pull both knees toward chest. Feel stretch in lower back or buttock area. Breathing deeply, Hold __20__ seconds. Repeat __3__ times. Do _2-3___ sessions per day.  http://gt2.exer.us/228   Copyright  VHI. All rights reserved.   Piriformis Stretch, Sitting    Sit, one ankle on opposite knee, same-side hand on crossed knee. Push down on knee, keeping spine straight. Lean torso forward, with flat back, until tension is felt in hamstrings and gluteals of crossed-leg side. Hold _20__ seconds.   Also do laying down crossing one ankle over knee and pulling to chest.  Repeat _2-3__ times per session. Do _3-4__ sessions per day.  Copyright  VHI. All rights reserved.   Hamstring Stretch    With other leg bent, foot flat, grasp right leg and slowly try to straighten knee. Hold __20__ seconds. Can also do in sitting leaning forward leading with chest with one leg straight with heel on floor, other knee bent to support yourself.  Repeat _2-3___ times. Do _3-4___ sessions per day.  http://gt2.exer.us/280   Copyright  VHI. All rights reserved.   Lower Trunk Rotation    Bring both knees in to chest. Rotate from side to side, keeping knees together and feet off floor. Repeat _3___ times per set holding for 20 seconds. Do _2-3___ sessions per day.  http://orth.exer.us/151   Copyright  VHI. All rights reserved.

## 2015-07-28 NOTE — Therapy (Signed)
[place], [place], [address] Phone: [phone]   Fax:  [phone]  Physical Therapy Treatment  Patient Details  Name: Mckenzie Sanford MRN: [id number] Date of Birth: [birth year] Referring Provider: Dr. [name]  Encounter Date: 07/28/2015      PT End of Session - 07/28/15 1436    Visit Number 2   Number of Visits 9   Date for PT Re-Evaluation 08/20/15   PT Start Time 1351   PT Stop Time 1434   PT Time Calculation (min) 43 min   Activity Tolerance Patient tolerated treatment well   Behavior During Therapy [facility] for tasks assessed/performed      Past Medical History  Diagnosis Date  . HTN (hypertension)   . Allergic rhinitis   . Hypercholesteremia   . IBS (irritable bowel syndrome)   . Breast cancer ([place]) 03/12/14    right/invasive ductal carcinoma, DCIS  . Wears glasses   . Depression   . Radiation 11/23/14-01/07/15    Right breast  . Arthritis     in back  . Difficulty sleeping     takes Lorazepam as needed  . Anxiety     Past Surgical History  Procedure Laterality Date  . Dilation and curettage of uterus    . Cyst excision      right axilla  . Colonoscopy    . Tonsillectomy    . Radioactive seed guided mastectomy with axillary sentinel lymph node biopsy Right 04/20/2014    Procedure: RIGHT BREAST RADIOACTIVE SEED GUIDED LUMPECTOMY WITH RIGHT AXILLARY SENTINEL LYMPH NODE BIOPSY;  Surgeon: [name], MD;  Location: [place];  Service: General;  Laterality: Right;  . Portacath placement Right 06/01/2014    Procedure: ULTRASOUND GUIDED INSERTION PORT-A-CATH;  Surgeon: [name], MD;  Location: [place];  Service: General;  Laterality: Right;  . Breast surgery  11/15    lumpectomy RT breast  . Port-a-cath removal N/A 03/12/2015    Procedure: REMOVAL PORT-A-CATH;  Surgeon: [name], MD;  Location[facility];  Service: General;   Laterality: N/A;    There were no vitals filed for this visit.  Visit Diagnosis:  Stiffness of joint, shoulder region, right  Bilateral low back pain without sciatica  Decreased ROM of trunk and back  Muscle, ligament, or fascia disorder  Musculoskeletal chest pain      Subjective Assessment - 07/28/15 1353    Subjective Had my mammogram last Thursday and I couldn't work for 2 days it hurt so bad! Not having any pain, my back is just really tight.   Pertinent History Right breast cancer with lumpectomy 04/20/14 with 3 lymph nodes removed.  Completed chemotherapy and radiation 01/07/15.  Went back to work 11/02/14.  HTN controlled with meds but still variable.  Hyperlipidemia.     Patient Stated Goals less pain on right side of chest and back   Currently in Pain? No/denies                         [facility] Adult PT Treatment/Exercise - 07/28/15 0001    Lumbar Exercises: Stretches   Active Hamstring Stretch 3 reps;20 seconds  Bil, also showed pt in sitting   Single Knee to Chest Stretch 2 reps;10 seconds  Bil   Double Knee to Chest Stretch 3 reps;20 seconds   Lower Trunk Rotation 3 reps;20 seconds   Piriformis Stretch 3 reps;20 seconds  Bil, also showed pt  this in sitting   Manual Therapy   Myofascial Release To Rt axilla and Rt UE pulling during PROM   Passive ROM To Rt shoulder in supine into flexion, abduction and D2 all to to pts tolerance   Neural Stretch To Rt UE                PT Education - 07/28/15 1436    Education provided Yes   Education Details Lumbar/hip flexibility; 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   Baseline 9/10   Time 2    Period Weeks   Status New   CC Short Term Goal  #2   Title right chest pain reduced at least 25%.   Baseline 6/10   Time 2   Period Weeks   Status New             Long Term Clinic Goals - 07/21/15 2153    CC Long Term Goal  #1   Title Patient will report at least 75% decrease in low back pain.   Baseline 9/10   Time 4   Period Weeks   Status New   CC Long Term Goal  #2   Title Patient will report at least 50% decrease in right chest area pain.   Baseline 6/10   Time 4   Period Weeks   Status New   CC Long Term Goal  #3   Title Independent in HEP for low back flexibility and chest stretches.   Time 4   Period Weeks   Status New   CC Long Term Goal  #4   Title Trunk AROM limited no more than 10% any direction.   Time 4   Period Weeks   Status New            Plan - 07/28/15 1436    Clinical Impression Statement Pt tolerated stretches very well today and reported Rt shoulder/chest feeling better after session. Low back felt good after stretching but hurt a little when sitting up at end of session. Overall pt did very well with treatment today and tolerated all stretches well.    Pt will benefit from skilled therapeutic intervention in order to improve on the following deficits Decreased range of motion;Decreased scar mobility;Pain   Rehab Potential Excellent   PT Frequency 2x / week   PT Duration 4 weeks   PT Treatment/Interventions Therapeutic exercise;Manual techniques;Passive range of motion;Scar mobilization;Moist Heat;Electrical Stimulation;Patient/family education;ADLs/Self Care Home Management;Cryotherapy;Taping   PT Next Visit Plan Review HEP prn, and core stabs (pelvic tilt series in supine) and further instruct in proper body mechanics prn. Cont myofascial release to Rt chest area and Rt shoulder PROM/manual therapy.   PT Home Exercise Plan see education section   Consulted and Agree with Plan of Care Patient        Problem List Patient Active  Problem List   Diagnosis Date Noted  . Hypotension 05/18/2015  . Fatigue 05/18/2015  . Nausea with vomiting 07/23/2014  . Dehydration 07/23/2014  . GERD (gastroesophageal reflux disease) 07/23/2014  . Neutropenia ([place]) 07/23/2014  . Breast cancer of upper-outer quadrant of right female breast ([place]) 03/20/2014  . Palpitations  03/26/2013  . Hypertension 07/02/2011  . Hyperlipidemia 07/02/2011  . CONSTIPATION 01/22/2009  . FLATULENCE-GAS-BLOATING 01/22/2009    Otelia Limes, PTA 07/28/2015, 3:23 PM  [place], [place], [address] Phone: [phone]   Fax:  [phone]  Name: Mckenzie Sanford MRN: [id number] Date of Birth: 14-Apr-1956

## 2015-07-30 ENCOUNTER — Ambulatory Visit: Payer: 59 | Admitting: Physical Therapy

## 2015-08-02 ENCOUNTER — Ambulatory Visit: Payer: 59 | Admitting: Physical Therapy

## 2015-08-02 DIAGNOSIS — IMO0002 Reserved for concepts with insufficient information to code with codable children: Secondary | ICD-10-CM

## 2015-08-02 DIAGNOSIS — M545 Low back pain, unspecified: Secondary | ICD-10-CM

## 2015-08-02 DIAGNOSIS — M2560 Stiffness of unspecified joint, not elsewhere classified: Secondary | ICD-10-CM

## 2015-08-02 DIAGNOSIS — M2569 Stiffness of other specified joint, not elsewhere classified: Secondary | ICD-10-CM

## 2015-08-02 DIAGNOSIS — M25611 Stiffness of right shoulder, not elsewhere classified: Secondary | ICD-10-CM

## 2015-08-02 NOTE — Therapy (Signed)
[place], [place], [address] Phone: [phone]   Fax:  [phone]  Physical Therapy Treatment  Patient Details  Name: Mckenzie Sanford MRN: [id number] Date of Birth: 1955-09-30 Referring Provider: Dr. [name]  Encounter Date: 08/02/2015      PT End of Session - 08/02/15 1441    Visit Number 3   Number of Visits 9   Date for PT Re-Evaluation 08/20/15   PT Start Time 1351   PT Stop Time 1437   PT Time Calculation (min) 46 min   Activity Tolerance Patient tolerated treatment well   Behavior During Therapy [facility] for tasks assessed/performed      Past Medical History  Diagnosis Date  . HTN (hypertension)   . Allergic rhinitis   . Hypercholesteremia   . IBS (irritable bowel syndrome)   . 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 Wears glasses   . Depression   . Radiation 11/23/14-01/07/15    Right breast  . Arthritis     in back  . Difficulty sleeping     takes Lorazepam as needed  . Anxiety     Past Surgical History  Procedure Laterality Date  . Dilation and curettage of uterus    . Cyst excision      right axilla  . Colonoscopy    . Tonsillectomy    . Radioactive seed guided mastectomy with axillary sentinel lymph node biopsy Right 04/20/2014    Procedure: RIGHT BREAST RADIOACTIVE SEED GUIDED LUMPECTOMY WITH RIGHT AXILLARY SENTINEL LYMPH NODE BIOPSY;  Surgeon: [name], MD;  Location: [place];  Service: General;  Laterality: Right;  . Portacath placement Right 06/01/2014    Procedure: ULTRASOUND GUIDED INSERTION PORT-A-CATH;  Surgeon: [name], MD;  Location: [place];  Service: General;  Laterality: Right;  . Breast surgery  11/15    lumpectomy RT breast  . Port-a-cath removal N/A 03/12/2015    Procedure: REMOVAL PORT-A-CATH;  Surgeon: [name], MD;  [facility];  Service: General;   Laterality: N/A;    There were no vitals filed for this visit.  Visit Diagnosis:  Bilateral low back pain without sciatica  Decreased ROM of trunk and back  Muscle, ligament, or fascia disorder  Stiffness of joint, shoulder region, right      Subjective Assessment - 08/02/15 1355    Subjective Chest part did a little better after last session, but back is still the same.  Didn't have any pain later Saturday and yesterday, but woke up today and had pain again.   Currently in Pain? Yes   Pain Score 8    Pain Location Back   Pain Orientation Lower;Left   Pain Descriptors / Indicators Tightness   Aggravating Factors  turning to the left   Pain Relieving Factors walking, some exercise                         [facility] Adult PT Treatment/Exercise - 08/02/15 0001    Lumbar Exercises: Stretches   Passive Hamstring Stretch 1 rep;30 seconds  in sitting with one foot forward   Single Knee to Chest Stretch 2 reps;10 seconds  Bil; shows full ROM   Double Knee to Chest Stretch 1 rep;20 seconds  plus one after quadruped, plank exercises   Lower Trunk Rotation 1 rep;30 seconds  in hooklying, plus did one after quadruped, plank   Piriformis Stretch  1 rep;30 seconds  figure 4   Lumbar Exercises: Standing   Other Standing Lumbar Exercises sidebend "I'm a little teapot"   Lumbar Exercises: Seated   Other Seated Lumbar Exercises sitting, bend forward with hands between legs for low back stretch, 30 seconds    Other Seated Lumbar Exercises In long sit, trunk rotation with one knee bent x 30 seconds each side   Lumbar Exercises: Quadruped   Opposite Arm/Leg Raise Right arm/Left leg;Left arm/Right leg;10 reps;3 seconds   Plank 15 seconds x 2, with instruction and cueing.   Other Quadruped Lumbar Exercises prayer stretch forward, to right, and then to left, 30 second hold each one                   Short Term Clinic Goals - 07/21/15 2156    CC Short Term Goal  #1    Title Low back pain reduced at least 25%.   Baseline 9/10   Time 2   Period Weeks   Status New   CC Short Term Goal  #2   Title right chest pain reduced at least 25%.   Baseline 6/10   Time 2   Period Weeks   Status New             Long Term Clinic Goals - 07/21/15 2153    CC Long Term Goal  #1   Title Patient will report at least 75% decrease in low back pain.   Baseline 9/10   Time 4   Period Weeks   Status New   CC Long Term Goal  #2   Title Patient will report at least 50% decrease in right chest area pain.   Baseline 6/10   Time 4   Period Weeks   Status New   CC Long Term Goal  #3   Title Independent in HEP for low back flexibility and chest stretches.   Time 4   Period Weeks   Status New   CC Long Term Goal  #4   Title Trunk AROM limited no more than 10% any direction.   Time 4   Period Weeks   Status New            Plan - 08/02/15 1434    Clinical Impression Statement Reduced pain to 3-4/10 from 8/10 during session.   Pt will benefit from skilled therapeutic intervention in order to improve on the following deficits Decreased range of motion;Decreased scar mobility;Pain   Rehab Potential Excellent   PT Frequency 2x / week   PT Duration 4 weeks   PT Treatment/Interventions Therapeutic exercise   PT Next Visit Plan Check goals.  Continue stretches including supine hooklying lower trunk rotation with arms outstretched, seated hamstring stretches, piriformis stretches.  Continue quadruped opposite arm and leg raises for lumbar stabilization; also plank.  Do supine lumbar stabilization also.  Add some of these to HEP--those that decrease pain.     PT Home Exercise Plan continue HEP from previous visit for now   Consulted and Agree with Plan of Care Patient        Problem List Patient Active Problem List   Diagnosis Date Noted  . Hypotension 05/18/2015  . Fatigue 05/18/2015  . Nausea with vomiting 07/23/2014  . Dehydration 07/23/2014  . GERD  (gastroesophageal reflux disease) 07/23/2014  . Neutropenia ([place]) 07/23/2014  . Breast cancer of upper-outer quadrant of right female breast ([place]) 03/20/2014  . Palpitations 03/26/2013  . Hypertension 07/02/2011  . Hyperlipidemia  07/02/2011  . CONSTIPATION 01/22/2009  . FLATULENCE-GAS-BLOATING 01/22/2009    Zamarion Longest 08/02/2015, 2:43 PM  [place], [place], [address] Phone: [phone]   Fax:  [phone]  Name: Mckenzie Sanford MRN: [id number] Date of Birth: 08/01/1955    Serafina Royals, PT 08/02/2015 2:43 PM

## 2015-08-04 ENCOUNTER — Ambulatory Visit: Payer: 59

## 2015-08-05 ENCOUNTER — Ambulatory Visit: Payer: 59 | Admitting: Physical Therapy

## 2015-08-05 DIAGNOSIS — M545 Low back pain, unspecified: Secondary | ICD-10-CM

## 2015-08-05 DIAGNOSIS — M2560 Stiffness of unspecified joint, not elsewhere classified: Secondary | ICD-10-CM

## 2015-08-05 DIAGNOSIS — M25611 Stiffness of right shoulder, not elsewhere classified: Secondary | ICD-10-CM

## 2015-08-05 DIAGNOSIS — R0789 Other chest pain: Secondary | ICD-10-CM

## 2015-08-05 DIAGNOSIS — M2569 Stiffness of other specified joint, not elsewhere classified: Secondary | ICD-10-CM

## 2015-08-05 DIAGNOSIS — IMO0002 Reserved for concepts with insufficient information to code with codable children: Secondary | ICD-10-CM

## 2015-08-05 NOTE — Patient Instructions (Signed)
Over Head Pull: Narrow Grip And Wide Grip       On back, knees bent, feet flat, band across thighs, elbows straight but relaxed. Pull hands apart (start). Keeping elbows straight, bring arms up and over head, hands toward floor. Keep pull steady on band. Hold momentarily. Return slowly, keeping pull steady, back to start. Repeat _5-10__ times. Band color _yellow_____  Side Pull: Double Arm   On back, knees bent, feet flat. Arms perpendicular to body, shoulder level, elbows straight but relaxed. Pull arms out to sides, elbows straight. Resistance band comes across collarbones, hands toward floor. Hold momentarily. Slowly return to starting position. Repeat _5-10__ times. Band color _yellow____   Sash   On back, knees bent, feet flat, left hand on left hip, right hand above left. Pull right arm DIAGONALLY (hip to shoulder) across chest. Bring right arm along head toward floor. Hold momentarily. Slowly return to starting position. Repeat _5-10__ times. Do with left arm. Band color yellow______   Shoulder Rotation: Double Arm   On back, knees bent, feet flat, elbows tucked at sides, bent 90, hands palms up. Pull hands apart and down toward floor, keeping elbows near sides. Hold momentarily. Slowly return to starting position. Repeat 5-10__ times. Band color yellow______   

## 2015-08-05 NOTE — Therapy (Signed)
[place], [place], [address] Phone: [phone]   Fax:  [phone]  Physical Therapy Treatment  Patient Details  Name: Mckenzie Sanford MRN: [id number] Date of Birth: 1955/10/12 Referring Provider: Dr. [name]  Encounter Date: 08/05/2015      PT End of Session - 08/05/15 1734    Visit Number 4   Number of Visits 9   Date for PT Re-Evaluation 08/20/15   PT Start Time 1430   PT Stop Time 1515   PT Time Calculation (min) 45 min   Activity Tolerance Patient tolerated treatment well   Behavior During Therapy [facility] for tasks assessed/performed      Past Medical History  Diagnosis Date  . HTN (hypertension)   . Allergic rhinitis   . Hypercholesteremia   . IBS (irritable bowel syndrome)   . 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 Wears glasses   . Depression   . Radiation 11/23/14-01/07/15    Right breast  . Arthritis     in back  . Difficulty sleeping     takes Lorazepam as needed  . Anxiety     Past Surgical History  Procedure Laterality Date  . Dilation and curettage of uterus    . Cyst excision      right axilla  . Colonoscopy    . Tonsillectomy    . Radioactive seed guided mastectomy with axillary sentinel lymph node biopsy Right 04/20/2014    Procedure: RIGHT BREAST RADIOACTIVE SEED GUIDED LUMPECTOMY WITH RIGHT AXILLARY SENTINEL LYMPH NODE BIOPSY;  Surgeon: [name], MD;  Location: [place];  Service: General;  Laterality: Right;  . Portacath placement Right 06/01/2014    Procedure: ULTRASOUND GUIDED INSERTION PORT-A-CATH;  Surgeon: [name], MD;  Location: [place];  Service: General;  Laterality: Right;  . Breast surgery  11/15    lumpectomy RT breast  . Port-a-cath removal N/A 03/12/2015    Procedure: REMOVAL PORT-A-CATH;  Surgeon: [name], MD;  [facility];  Service: General;   Laterality: N/A;    There were no vitals filed for this visit.  Visit Diagnosis:  Bilateral low back pain without sciatica  Decreased ROM of trunk and back  Muscle, ligament, or fascia disorder  Stiffness of joint, shoulder region, right  Musculoskeletal chest pain      Subjective Assessment - 08/05/15 1447    Subjective started a little tingling in the upper back when her back is hurting    Pertinent History Right breast cancer with lumpectomy 04/20/14 with 3 lymph nodes removed.  Completed chemotherapy and radiation 01/07/15.  Went back to work 11/02/14.  HTN controlled with meds but still variable.  Hyperlipidemia.     Patient Stated Goals less pain on right side of chest and back   Currently in Pain? Yes   Pain Score 4    Pain Location Back   Pain Orientation Right;Left;Lower   Pain Descriptors / Indicators Tightness   Pain Type Chronic pain                         [facility] Adult PT Treatment/Exercise - 08/05/15 0001    Lumbar Exercises: Standing   Heel Raises 5 reps   Other Standing Lumbar Exercises "polish your halo" with core activation    Lumbar Exercises: Supine   Other Supine Lumbar Exercises pelvic tilts, then tilts with single knee to chest  Other Supine Lumbar Exercises lower trunk rotation    Knee/Hip Exercises: Sidelying   Hip ABduction AROM;Both;10 reps   Clams --  10 reps with core engaged   Other Sidelying Knee/Hip Exercises arm abdction with stretch to lateral chest    Shoulder Exercises: Supine   Other Supine Exercises supine scapular series with yellow theraband 5 reps each    Other Supine Exercises 'telescoping arms" for upper trunk and anterior chest stretch                 PT Education - 08/05/15 1733    Education provided Yes   Education Details supine scapular series, pevic tilts for back stretches    Person(s) Educated Patient   Methods Explanation;Demonstration;Handout   Comprehension Verbalized  understanding;Returned demonstration           Short Term Clinic Goals - 07/21/15 2156    CC Short Term Goal  #1   Title Low back pain reduced at least 25%.   Baseline 9/10   Time 2   Period Weeks   Status New   CC Short Term Goal  #2   Title right chest pain reduced at least 25%.   Baseline 6/10   Time 2   Period Weeks   Status New             Long Term Clinic Goals - 07/21/15 2153    CC Long Term Goal  #1   Title Patient will report at least 75% decrease in low back pain.   Baseline 9/10   Time 4   Period Weeks   Status New   CC Long Term Goal  #2   Title Patient will report at least 50% decrease in right chest area pain.   Baseline 6/10   Time 4   Period Weeks   Status New   CC Long Term Goal  #3   Title Independent in HEP for low back flexibility and chest stretches.   Time 4   Period Weeks   Status New   CC Long Term Goal  #4   Title Trunk AROM limited no more than 10% any direction.   Time 4   Period Weeks   Status New            Plan - 08/05/15 1735    Clinical Impression Statement Upgraded home exercise program to inculde dynamic stretching to anterior chest and strengthening to scapula Pt did well with all exercise    Pt will benefit from skilled therapeutic intervention in order to improve on the following deficits Decreased range of motion;Decreased scar mobility;Pain   Rehab Potential Excellent   Clinical Impairments Affecting Rehab Potential previous radiation    PT Treatment/Interventions Therapeutic exercise   PT Next Visit Plan Check goals.  Continue stretches including supine hooklying lower trunk rotation with arms outstretched, seated hamstring stretches, piriformis stretches.  Continue quadruped opposite arm and leg raises for lumbar stabilization; also plank.  Do supine lumbar stabilization also.  Add some of these to HEP--those that decrease pain.     Recommended Other Services consider sessions with Cheryln Manly PT for pilates  training    Consulted and Agree with Plan of Care Patient        Problem List Patient Active Problem List   Diagnosis Date Noted  . Hypotension 05/18/2015  . Fatigue 05/18/2015  . Nausea with vomiting 07/23/2014  . Dehydration 07/23/2014  . GERD (gastroesophageal reflux disease) 07/23/2014  . Neutropenia ([place]) 07/23/2014  .  Breast cancer of upper-outer quadrant of right female breast ([place]) 03/20/2014  . Palpitations 03/26/2013  . Hypertension 07/02/2011  . Hyperlipidemia 07/02/2011  . CONSTIPATION 01/22/2009  . FLATULENCE-GAS-BLOATING 01/22/2009  Donato Heinz. Owens Shark, PT    08/05/2015, 5:38 PM  [place], [place], [address] Phone: [phone]   Fax:  [phone]  Name: Mckenzie Sanford MRN: [id number] Date of Birth: 03/14/1956

## 2015-08-09 ENCOUNTER — Encounter: Payer: 59 | Admitting: Physical Therapy

## 2015-08-11 ENCOUNTER — Ambulatory Visit: Payer: 59 | Admitting: Physical Therapy

## 2015-08-11 DIAGNOSIS — M545 Low back pain, unspecified: Secondary | ICD-10-CM

## 2015-08-11 DIAGNOSIS — M25611 Stiffness of right shoulder, not elsewhere classified: Secondary | ICD-10-CM | POA: Diagnosis not present

## 2015-08-11 DIAGNOSIS — R0789 Other chest pain: Secondary | ICD-10-CM

## 2015-08-11 DIAGNOSIS — M2560 Stiffness of unspecified joint, not elsewhere classified: Secondary | ICD-10-CM

## 2015-08-11 DIAGNOSIS — M2569 Stiffness of other specified joint, not elsewhere classified: Secondary | ICD-10-CM

## 2015-08-11 DIAGNOSIS — IMO0002 Reserved for concepts with insufficient information to code with codable children: Secondary | ICD-10-CM

## 2015-08-11 NOTE — Therapy (Signed)
[place], [place], [address] Phone: [phone]   Fax:  [phone]  Physical Therapy Treatment  Patient Details  Name: Mckenzie Sanford MRN: [id number] Date of Birth: 1956/02/22 Referring Provider: Dr. [name]  Encounter Date: 08/11/2015      PT End of Session - 08/11/15 2055    Number of Visits 9   Date for PT Re-Evaluation 08/20/15   Authorization - Visit Number 5   PT Start Time 1427   PT Stop Time 1605   PT Time Calculation (min) 48 min   Activity Tolerance Patient tolerated treatment well   Behavior During Therapy [facility] for tasks assessed/performed      Past Medical History  Diagnosis Date  . HTN (hypertension)   . Allergic rhinitis   . Hypercholesteremia   . IBS (irritable bowel syndrome)   . Breast cancer ([place]) 03/12/14    right/invasive ductal carcinoma, DCIS  . Wears glasses   . Depression   . Radiation 11/23/14-01/07/15    Right breast  . Arthritis     in back  . Difficulty sleeping     takes Lorazepam as needed  . Anxiety     Past Surgical History  Procedure Laterality Date  . Dilation and curettage of uterus    . Cyst excision      right axilla  . Colonoscopy    . Tonsillectomy    . Radioactive seed guided mastectomy with axillary sentinel lymph node biopsy Right 04/20/2014    Procedure: RIGHT BREAST RADIOACTIVE SEED GUIDED LUMPECTOMY WITH RIGHT AXILLARY SENTINEL LYMPH NODE BIOPSY;  Surgeon: [name], MD;  Location: [facility];  Service: General;  Laterality: Right;  . Portacath placement Right 06/01/2014    Procedure: ULTRASOUND GUIDED INSERTION PORT-A-CATH;  Surgeon: [name], MD;  Location: [facility];  Service: General;  Laterality: Right;  . Breast surgery  11/15    lumpectomy RT breast  . Port-a-cath removal N/A 03/12/2015    Procedure: REMOVAL PORT-A-CATH;  Surgeon: [name], MD;  Location: [facility];  Service:  General;  Laterality: N/A;    There were no vitals filed for this visit.  Visit Diagnosis:  Bilateral low back pain without sciatica  Decreased ROM of trunk and back  Muscle, ligament, or fascia disorder  Stiffness of joint, shoulder region, right  Musculoskeletal chest pain      Subjective Assessment - 08/11/15 1519    Subjective "I got my results back.  My doctor wanted to do an x-ray and they found diffuse degenerative disc disease."   Currently in Pain? Yes   Pain Score 5    Pain Location Back   Pain Orientation Right;Left;Lower   Aggravating Factors  possibly cold weather   Pain Relieving Factors muscle relaxant, tylenol, stretches   Pain Score 3   Pain Location Axilla   Pain Orientation Right   Aggravating Factors  cold weather, cold workspace   Pain Relieving Factors keep arm still                         [facility] Adult PT Treatment/Exercise - 08/11/15 0001    Self-Care   Self-Care Other Self-Care Comments   Other Self-Care Comments  educated patient about spinal anatomy and about degenerative disc disease; also talked about Pilates, showed her the reformer, and had her meet [name] to talk about that  also told patient to stop bilateral SLR in supine  Lumbar Exercises: Supine   Other Supine Lumbar Exercises reviewed pelvic tilts, 5 counts x 5, then pelvic tilts with knees apart and together x 10  patient did well with these   Other Supine Lumbar Exercises pelvic tilt with right, then left left extension and return to hooklying x 5   Shoulder Exercises: Supine   Other Supine Exercises discussed supine scapular series and reviewed with patient; patient remembered some and not others, so had her do the narrow and wide grip shoulder flexion ones x 5 each   Shoulder Exercises: Sidelying   Other Sidelying Exercises in left sidelying, right UE D2 motions x 10, keeping abdominals engaged                PT Education - 08/11/15 2054     Education provided --   Education Details --   Person(s) Educated --   Methods --   Comprehension --           Short Term Clinic Goals - 08/11/15 1526    CC Short Term Goal  #1   Title Low back pain reduced at least 25%.   Baseline "maybe 70?" on 08/11/15   Status Achieved   CC Short Term Goal  #2   Title right chest pain reduced at least 25%.   Baseline about 80% better on 08/11/15   Status Achieved             Long Term Clinic Goals - 08/11/15 2104    CC Long Term Goal  #1   Title Patient will report at least 75% decrease in low back pain.   Status Partially Met   CC Long Term Goal  #2   Title Patient will report at least 50% decrease in right chest area pain.   Status Achieved   CC Long Term Goal  #3   Title Independent in HEP for low back flexibility and chest stretches.   Status Partially Met   CC Long Term Goal  #4   Title Trunk AROM limited no more than 10% any direction.   Status On-going            Plan - 08/11/15 2057    Clinical Impression Statement Patient continues to report right axillary and low back pain at mid-point of 0 to 10 scale, but she also reports improvement in both areas in the 60-70% range.  She needed review of some of supine scapular exercises instructed last time--she seemed to have remembered to do some but not all of these.  she was recently told she has degenerative disc disease, so she was educated about this today.  She learned more about Pilates today and is considering whether to pursue this in therapy; currently she wants to continue to work with [place] therapists on her pain problems.   Pt will benefit from skilled therapeutic intervention in order to improve on the following deficits Decreased range of motion;Decreased scar mobility;Pain;Decreased strength   Rehab Potential Excellent   Clinical Impairments Affecting Rehab Potential previous radiation    PT Frequency 2x / week   PT Duration 4 weeks   PT  Treatment/Interventions ADLs/Self Care Home Management;Patient/family education;Therapeutic exercise   PT Next Visit Plan Review supine lumbar stabilization exercises and consider adding more of these to HEP.  Try plank and quadruped arm and leg raises again, making adjustments for her comfort (had c/o wrist discomfort).   PT Home Exercise Plan continue HEP from previous visit for now   Consulted and  Agree with Plan of Care Patient        Problem List Patient Active Problem List   Diagnosis Date Noted  . Hypotension 05/18/2015  . Fatigue 05/18/2015  . Nausea with vomiting 07/23/2014  . Dehydration 07/23/2014  . GERD (gastroesophageal reflux disease) 07/23/2014  . Neutropenia ([place]) 07/23/2014  . Breast cancer of upper-outer quadrant of right female breast ([place]) 03/20/2014  . Palpitations 03/26/2013  . Hypertension 07/02/2011  . Hyperlipidemia 07/02/2011  . CONSTIPATION 01/22/2009  . FLATULENCE-GAS-BLOATING 01/22/2009    SALISBURY,DONNA 08/11/2015, 9:07 PM  [place], [place], [address] Phone: [phone]   Fax:  [phone]  Name: SWAYZIE CHOATE MRN: [id number] Date of Birth: Apr 04, 1956    Serafina Royals, PT 08/11/2015 9:07 PM

## 2015-08-12 ENCOUNTER — Ambulatory Visit: Payer: 59 | Admitting: Physical Therapy

## 2015-08-15 NOTE — Assessment & Plan Note (Addendum)
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  Current treatment: anastrozole 1 mg daily 5 years started 02/16/2015  Anastrozole toxicities: 1. Diffuse muscle aches and pains and spasms 2. Severe fatigue usually starts 3 hours after taking the medication I discussed about switching the medication to bedtime. We also discussed that she could take tonic water at bedtime to help with her muscle aches and pains. If the symptoms do not get better with these maneuvers, we might consider switching her from anastrozole to letrozole or exemestane.  Hypotension: I reviewed her medications. I asked her to discontinue amlodipine and see Dr. Dagmar Hait her primary care physician. Her tiredness could be related to low blood pressure rather than anything else.  Return to clinic in 6 months for follow-up

## 2015-08-16 ENCOUNTER — Ambulatory Visit (HOSPITAL_BASED_OUTPATIENT_CLINIC_OR_DEPARTMENT_OTHER): Payer: 59 | Admitting: Hematology and Oncology

## 2015-08-16 ENCOUNTER — Telehealth: Payer: Self-pay | Admitting: Hematology and Oncology

## 2015-08-16 ENCOUNTER — Encounter: Payer: Self-pay | Admitting: Hematology and Oncology

## 2015-08-16 VITALS — BP 150/79 | HR 68 | Temp 98.1°F | Resp 18 | Wt 128.6 lb

## 2015-08-16 DIAGNOSIS — N951 Menopausal and female climacteric states: Secondary | ICD-10-CM

## 2015-08-16 DIAGNOSIS — C50411 Malignant neoplasm of upper-outer quadrant of right female breast: Secondary | ICD-10-CM | POA: Diagnosis not present

## 2015-08-16 DIAGNOSIS — R5383 Other fatigue: Secondary | ICD-10-CM | POA: Diagnosis not present

## 2015-08-16 DIAGNOSIS — C773 Secondary and unspecified malignant neoplasm of axilla and upper limb lymph nodes: Secondary | ICD-10-CM

## 2015-08-16 MED ORDER — TRAMADOL HCL 50 MG PO TABS: 50.0000 mg | ORAL_TABLET | Freq: Two times a day (BID) | ORAL | Status: DC | PRN

## 2015-08-16 NOTE — Progress Notes (Signed)
Patient Care Team: [name], MD as PCP - General (Internal Medicine) [name], MD as Consulting Physician (General Surgery) [name], MD as Consulting Physician (Hematology and Oncology) [name], MD as Consulting Physician (Radiation Oncology) [name], MD as Consulting Physician (Obstetrics and Gynecology) [name], NP as Nurse Practitioner (Hematology and Oncology)  DIAGNOSIS: Breast cancer of upper-outer quadrant of right female breast [facility])   Staging form: Breast, AJCC 7th Edition     Clinical: Stage IA (T1b, N0, cM0) - Signed by Rulon Eisenmenger, MD on 03/20/2014     Pathologic: Stage IIB (T2, N1, cM0) - Unsigned   SUMMARY OF ONCOLOGIC HISTORY:   Breast cancer of upper-outer quadrant of right female breast ([place])   03/10/2014 Mammogram Right breast: mass   03/10/2014 Breast US Right breast: 1 cm irregular mass, UOQ, anterior depth, hypoechoic with posterior acoustic shadowing.  LN with focal cortex thickening in right axilla.   03/12/2014 Initial Biopsy Right breast core needle bx: Invasive ductal carcinoma with DCIS, right axillary lymph node biopsy negative, ER+ (100%), PR+ (84%), Ki-67 16%, HER-2 negative (ratio 1.22)   03/20/2014 Breast MRI Right breast UOQ: 2.6 x 1.9 x 1.7 cm invasive ductal carcinoma with extension and involvement of the adjacent skin and nipple area complex, no abnormal lymph nodes   03/21/2014 Clinical Stage Stage IA (T1b, N0, cM0)   04/20/2014 Definitive Surgery Right breast lumpectomy/SLNB([name]): IDC grade 2; 2.5 cm with intermediate to high-grade DCIS margins negative: 1/3 positive sentinel lymph node with extracapsular extension, ER 100%, PR 84%, HER-2 negative ratio 1.22, Ki-67 16%   04/20/2014 Pathologic Stage  Stage IIB: pT2, pN1a, cM0    06/04/2014 - 10/22/2014 Adjuvant Chemotherapy Dose dense doxorubicin and cyclophosphamide x 4 cycles followed by paclitaxel x 1 then nab-paclitaxel x 11 (total 12  cycles)   11/23/2014 - 01/07/2015 Radiation Therapy Adjuvant RT [name]): Right breast / 45 Gray over 25 fractions; Right breast boost / 16 Gray over 8 fractions   02/17/2015 -  Anti-estrogen oral therapy Anastrozole 1 mg daily. Planned duration of therapy: 5 years   04/01/2015 Survivorship Survivorship care plan completed and given to patient.    CHIEF COMPLIANT: follow-up on anastrozole  INTERVAL HISTORY: Mckenzie Sanford is a 60 year old with above-mentioned history of right breast cancer currently on anastrozole antiestrogen therapy. She appears to be tolerating it fairly well. She complains of hot flashes which seem to be bothering her but she has had hot flashes even prior to starting anastrozole. She no longer has a muscle cramps and achiness. She denies any lumps or nodules in the breasts. She had a recent mammogram which was normal.  REVIEW OF SYSTEMS:   Constitutional: Denies fevers, chills or abnormal weight loss Eyes: Denies blurriness of vision Ears, nose, mouth, throat, and face: Denies mucositis or sore throat Respiratory: Denies cough, dyspnea or wheezes Cardiovascular: Denies palpitation, chest discomfort Gastrointestinal:  Denies nausea, heartburn or change in bowel habits Skin: Denies abnormal skin rashes Lymphatics: Denies new lymphadenopathy or easy bruising Neurological:Denies numbness, tingling or new weaknesses Behavioral/Psych: Mood is stable, no new changes  Extremities: No lower extremity edema Breast:  denies any pain or lumps or nodules in either breasts All other systems were reviewed with the patient and are negative.  I have reviewed the past medical history, past surgical history, social history and family history with the patient and they are unchanged from previous note.  ALLERGIES:  is allergic to shellfish allergy; ivp dye; and penicillins.  MEDICATIONS:  Current Outpatient Prescriptions  Medication Sig Dispense Refill  . amLODipine (NORVASC) 10 MG  tablet Take 10 mg by mouth at bedtime.     Marland Kitchen anastrozole (ARIMIDEX) 1 MG tablet Take 1 tablet (1 mg total) by mouth daily. 90 tablet 3  . atorvastatin (LIPITOR) 20 MG tablet Take 20 mg by mouth daily.    . Calcium Carb-Cholecalciferol (LIQUID CALCIUM WITH D3) [id number] MG-UNIT CAPS Take 600 mg by mouth. Calcium 600 mg with 400 iu of D3    . diphenhydrAMINE (BENADRYL) 25 mg capsule Take 25 mg by mouth every 6 (six) hours as needed.    . diphenhydrAMINE (SOMINEX) 25 MG tablet Take 25 mg by mouth at bedtime as needed for itching or sleep. Reported on 07/21/2015    . HYDROcodone-acetaminophen (NORCO/VICODIN) 5-325 MG tablet Take 1-2 tablets by mouth every 4 (four) hours as needed for moderate pain or severe pain. 30 tablet 0  . irbesartan (AVAPRO) 150 MG tablet Take 150 mg by mouth daily.     Marland Kitchen LINZESS 290 MCG CAPS capsule Take 290 mcg by mouth daily as needed.  11  . LORazepam (ATIVAN) 0.5 MG tablet Take 1 tablet (0.5 mg total) by mouth at bedtime. 30 tablet 0  . metoprolol succinate (TOPROL-XL) 50 MG 24 hr tablet Take 50 mg by mouth every morning. Take with or immediately following a meal.    . Polyethyl Glycol-Propyl Glycol (SYSTANE OP) Apply 1 drop to eye daily as needed (Dry eyes).    . sertraline (ZOLOFT) 25 MG tablet Take 25 mg by mouth daily.   11  . simvastatin (ZOCOR) 40 MG tablet Take 40 mg by mouth every evening. Reported on 07/21/2015     No current facility-administered medications for this visit.    PHYSICAL EXAMINATION: ECOG PERFORMANCE STATUS: 1 - Symptomatic but completely ambulatory  Filed Vitals:   08/16/15 0940  BP: 150/79  Pulse: 68  Temp: 98.1 F (36.7 C)  Resp: 18   Filed Weights   08/16/15 0940  Weight: 128 lb 9.6 oz (58.333 kg)    GENERAL:alert, no distress and comfortable SKIN: skin color, texture, turgor are normal, no rashes or significant lesions EYES: normal, Conjunctiva are pink and non-injected, sclera clear OROPHARYNX:no exudate, no erythema and lips,  buccal mucosa, and tongue normal  NECK: supple, thyroid normal size, non-tender, without nodularity LYMPH:  no palpable lymphadenopathy in the cervical, axillary or inguinal LUNGS: clear to auscultation and percussion with normal breathing effort HEART: regular rate & rhythm and no murmurs and no lower extremity edema ABDOMEN:abdomen soft, non-tender and normal bowel sounds MUSCULOSKELETAL:no cyanosis of digits and no clubbing  NEURO: alert & oriented x 3 with fluent speech, no focal motor/sensory deficits EXTREMITIES: No lower extremity edema BREAST: No palpable masses or nodules in either right or left breasts. No palpable axillary supraclavicular or infraclavicular adenopathy no breast tenderness or nipple discharge. (exam performed in the presence of a chaperone)  LABORATORY DATA:  I have reviewed the data as listed   Chemistry      Component Value Date/Time   NA 142 02/17/2015 1453   NA 140 09/10/2014 0909   K 3.6 02/17/2015 1453   K 3.6 09/10/2014 0909   CL 105 09/10/2014 0909   CO2 29 02/17/2015 1453   CO2 28 09/10/2014 0909   BUN 15.7 02/17/2015 1453   BUN 7 09/10/2014 0909   CREATININE 0.7 02/17/2015 1453   CREATININE 0.65 09/10/2014 0909   CREATININE 0.72 12/11/2011 1238  Component Value Date/Time   CALCIUM 10.0 02/17/2015 1453   CALCIUM 10.1 09/10/2014 0909   ALKPHOS 151* 02/17/2015 1453   ALKPHOS 142* 09/10/2014 0909   AST 28 02/17/2015 1453   AST 31 09/10/2014 0909   ALT 38 02/17/2015 1453   ALT 41* 09/10/2014 0909   BILITOT <0.30 02/17/2015 1453   BILITOT 0.3 09/10/2014 0909      Lab Results  Component Value Date   WBC 4.6 02/17/2015   HGB 13.5 02/17/2015   HCT 39.3 02/17/2015   MCV 88.7 02/17/2015   PLT 277 02/17/2015   NEUTROABS 2.8 02/17/2015   ASSESSMENT & PLAN:  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  Current treatment: anastrozole 1 mg daily 5 years started 02/16/2015  Anastrozole toxicities: 1. Diffuse muscle aches and pains and spasms: resolved 2. Severe fatigue usually starts 3 hours after taking the medication 3. Severe hot flashes  I will order a bone density test for evaluation of osteoporosis. This order will be sent to [facility].  If the symptoms do not get better over time, we might consider switching her from anastrozole to letrozole or exemestane.  Return to clinic in 6 months for follow-up  No orders of the defined types were placed in this encounter.   The patient has a good understanding of the overall plan. she agrees with it. she will call with any problems that may develop before the next visit here.   Rulon Eisenmenger, MD 08/16/2015

## 2015-08-16 NOTE — Addendum Note (Signed)
Addended by: Prentiss Bells on: 08/16/2015 06:20 PM   Modules accepted: Orders, Medications

## 2015-08-16 NOTE — Telephone Encounter (Signed)
Made appt per 3/20 pof. Bone density scheduled for 3/27 at 1215. avs mailed to patient

## 2015-08-16 NOTE — Progress Notes (Signed)
Unable to get in to exam room prior to MD.  No assessment performed.  

## 2015-08-18 ENCOUNTER — Ambulatory Visit: Payer: 59 | Admitting: Physical Therapy

## 2015-08-18 DIAGNOSIS — M25611 Stiffness of right shoulder, not elsewhere classified: Secondary | ICD-10-CM | POA: Diagnosis not present

## 2015-08-18 DIAGNOSIS — M545 Low back pain, unspecified: Secondary | ICD-10-CM

## 2015-08-18 DIAGNOSIS — M2569 Stiffness of other specified joint, not elsewhere classified: Secondary | ICD-10-CM

## 2015-08-18 DIAGNOSIS — IMO0002 Reserved for concepts with insufficient information to code with codable children: Secondary | ICD-10-CM

## 2015-08-18 DIAGNOSIS — M2560 Stiffness of unspecified joint, not elsewhere classified: Secondary | ICD-10-CM

## 2015-08-18 NOTE — Patient Instructions (Signed)
Do the following once a day:  PELVIC STABILIZATION: Pelvic Tilt (Lying)    Exhaling, pull belly toward spine, tilting pelvis forward. Inhaling, release. Repeat _5__ times. Do _1__ times per day.  Copyright  VHI. All rights reserved.     PELVIC STABILIZATION: knees apart and together  Do pelvic tilt as above.  While holding tummy muscles tight, spread knees apart, then bring them together before releasing tummy muscles.  Keep breathing as you do this.  Do 10 repetitions.    PELVIC STABILIZATION: Heel Slide    With both knees bent, keep low back pressed to floor. Inhaling, slide right foot forward until leg is straight. Exhaling, pull leg back. Repeat with other leg. Repeat _10__ times, alternating legs. Do _1__ times per day.  Copyright  VHI. All rights reserved.     PELVIC STABILIZATION:  Marching  In same position on your back with knees bent and feet flat, tighten tummy muscles (belly button towards spine). While holding tummy tucked, pick up one foot and put it down, then same with the other foot ("marching") so you do 5 steps on each side or a total of 10. Don't let go with the tummy tuck until you finish 10 marching steps, then relax. Repeat this 5 times.

## 2015-08-18 NOTE — Therapy (Signed)
[place], [place], [address] Phone: [phone]   Fax:  [phone]  Physical Therapy Treatment  Patient Details  Name: Mckenzie Sanford MRN: [id number] Date of Birth: 1955-06-18 Referring Provider: Dr. [name]  Encounter Date: 08/18/2015      PT End of Session - 08/18/15 1709    Visit Number 5   Number of Visits 9   Date for PT Re-Evaluation 08/20/15   PT Start Time 1347   PT Stop Time 1430   PT Time Calculation (min) 43 min   Activity Tolerance Patient tolerated treatment well   Behavior During Therapy [facility] for tasks assessed/performed      Past Medical History  Diagnosis Date  . HTN (hypertension)   . Allergic rhinitis   . Hypercholesteremia   . IBS (irritable bowel syndrome)   . 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 Wears glasses   . Depression   . Radiation 11/23/14-01/07/15    Right breast  . Arthritis     in back  . Difficulty sleeping     takes Lorazepam as needed  . Anxiety     Past Surgical History  Procedure Laterality Date  . Dilation and curettage of uterus    . Cyst excision      right axilla  . Colonoscopy    . Tonsillectomy    . Radioactive seed guided mastectomy with axillary sentinel lymph node biopsy Right 04/20/2014    Procedure: RIGHT BREAST RADIOACTIVE SEED GUIDED LUMPECTOMY WITH RIGHT AXILLARY SENTINEL LYMPH NODE BIOPSY;  Surgeon: [name], MD;  Location: [place];  Service: General;  Laterality: Right;  . Portacath placement Right 06/01/2014    Procedure: ULTRASOUND GUIDED INSERTION PORT-A-CATH;  Surgeon: [name], MD;  Location: [place];  Service: General;  Laterality: Right;  . Breast surgery  11/15    lumpectomy RT breast  . Port-a-cath removal N/A 03/12/2015    Procedure: REMOVAL PORT-A-CATH;  Surgeon: [name], MD;  [facility];  Service: General;   Laterality: N/A;    There were no vitals filed for this visit.  Visit Diagnosis:  Bilateral low back pain without sciatica  Decreased ROM of trunk and back  Muscle, ligament, or fascia disorder      Subjective Assessment - 08/18/15 1353    Subjective Now has a muscle relaxant and a pain med.  Dr. [name] wants to have her do a bone density test.  May be doing a study related to chemo brain.  Pain "is manageable": not much pain today.  Still stiff in the morning.  Back hurts more when she has to go to the bathroom  and has to hold it.   Currently in Pain? No/denies   Pain Score 0                         [facility] Adult PT Treatment/Exercise - 08/18/15 0001    Lumbar Exercises: Stretches   Active Hamstring Stretch --  reviewed seated hamstring stretch   Lumbar Exercises: Supine   Other Supine Lumbar Exercises reviewed pelvic tilts, 5 counts x 5, then pelvic tilts with knees apart and together x 10; patient also did lower trunk rotation  patient did well with these   Other Supine Lumbar Exercises pelvic tilt with right, then left leg extension and return to hooklying x 5; then pelvic tilt with marches x 10 x  5 sets   Lumbar Exercises: Quadruped   Plank tried plank and gave patient feedback; she was not able to hold her back in neutral (tended towards swayback).                PT Education - 08/18/15 1708    Education provided Yes   Education Details pelvic tilts with various stabilization challenges (see instruction section)   Person(s) Educated Patient   Methods Explanation;Demonstration;Tactile cues;Verbal cues;Handout   Comprehension Verbalized understanding;Returned demonstration           Short Term Clinic Goals - 08/11/15 1526    CC Short Term Goal  #1   Title Low back pain reduced at least 25%.   Baseline "maybe 70?" on 08/11/15   Status Achieved   CC Short Term Goal  #2   Title right chest pain reduced at least 25%.   Baseline about 80%  better on 08/11/15   Status Achieved             Long Term Clinic Goals - 08/11/15 2104    CC Long Term Goal  #1   Title Patient will report at least 75% decrease in low back pain.   Status Partially Met   CC Long Term Goal  #2   Title Patient will report at least 50% decrease in right chest area pain.   Status Achieved   CC Long Term Goal  #3   Title Independent in HEP for low back flexibility and chest stretches.   Status Partially Met   CC Long Term Goal  #4   Title Trunk AROM limited no more than 10% any direction.   Status On-going            Plan - 08/18/15 1709    Clinical Impression Statement Doing well with instruction and reinforcement of home exericse program for back pain/trunk strengthening.   Pt will benefit from skilled therapeutic intervention in order to improve on the following deficits Decreased range of motion;Decreased scar mobility;Pain;Decreased strength   Rehab Potential Excellent   Clinical Impairments Affecting Rehab Potential previous radiation    PT Frequency 2x / week   PT Duration 4 weeks   PT Treatment/Interventions Therapeutic exercise;Patient/family education   PT Next Visit Plan check goals; continue core strengthening and HEP education   PT Home Exercise Plan add pelvic tilt stabilization   Consulted and Agree with Plan of Care Patient        Problem List Patient Active Problem List   Diagnosis Date Noted  . Hypotension 05/18/2015  . Fatigue 05/18/2015  . Nausea with vomiting 07/23/2014  . Dehydration 07/23/2014  . GERD (gastroesophageal reflux disease) 07/23/2014  . Neutropenia ([place]) 07/23/2014  . Breast cancer of upper-outer quadrant of right female breast ([place]) 03/20/2014  . Palpitations 03/26/2013  . Hypertension 07/02/2011  . Hyperlipidemia 07/02/2011  . CONSTIPATION 01/22/2009  . FLATULENCE-GAS-BLOATING 01/22/2009    Eltha Tingley 08/18/2015, 5:13 PM  [place], [place], [address] Phone: [phone]   Fax:  [phone]  Name: SIVAN CUELLO MRN: [id number] Date of Birth: 08-20-1955    Serafina Royals, PT 08/18/2015 5:13 PM

## 2015-08-23 ENCOUNTER — Ambulatory Visit: Payer: 59 | Admitting: Physical Therapy

## 2015-08-23 DIAGNOSIS — M25611 Stiffness of right shoulder, not elsewhere classified: Secondary | ICD-10-CM | POA: Diagnosis not present

## 2015-08-23 DIAGNOSIS — R0789 Other chest pain: Secondary | ICD-10-CM

## 2015-08-23 DIAGNOSIS — M2560 Stiffness of unspecified joint, not elsewhere classified: Secondary | ICD-10-CM

## 2015-08-23 DIAGNOSIS — M2569 Stiffness of other specified joint, not elsewhere classified: Secondary | ICD-10-CM

## 2015-08-23 DIAGNOSIS — M545 Low back pain, unspecified: Secondary | ICD-10-CM

## 2015-08-23 DIAGNOSIS — IMO0002 Reserved for concepts with insufficient information to code with codable children: Secondary | ICD-10-CM

## 2015-08-23 NOTE — Therapy (Signed)
[place], [place], [address] Phone: [phone]   Fax:  [phone]  Physical Therapy Treatment  Patient Details  Name: Mckenzie Sanford MRN: [id number] Date of Birth: 09/01/1955 Referring Provider: Dr. [name]  Encounter Date: 08/23/2015      PT End of Session - 08/23/15 2206    Visit Number 6   Number of Visits 12   Date for PT Re-Evaluation 09/22/15   PT Start Time 5974   PT Stop Time 1438   PT Time Calculation (min) 43 min   Activity Tolerance Patient tolerated treatment well   Behavior During Therapy [facility] for tasks assessed/performed      Past Medical History  Diagnosis Date  . HTN (hypertension)   . Allergic rhinitis   . Hypercholesteremia   . IBS (irritable bowel syndrome)   . 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 Wears glasses   . Depression   . Radiation 11/23/14-01/07/15    Right breast  . Arthritis     in back  . Difficulty sleeping     takes Lorazepam as needed  . Anxiety     Past Surgical History  Procedure Laterality Date  . Dilation and curettage of uterus    . Cyst excision      right axilla  . Colonoscopy    . Tonsillectomy    . Radioactive seed guided mastectomy with axillary sentinel lymph node biopsy Right 04/20/2014    Procedure: RIGHT BREAST RADIOACTIVE SEED GUIDED LUMPECTOMY WITH RIGHT AXILLARY SENTINEL LYMPH NODE BIOPSY;  Surgeon: [name], MD;  Location: [place];  Service: General;  Laterality: Right;  . Portacath placement Right 06/01/2014    Procedure: ULTRASOUND GUIDED INSERTION PORT-A-CATH;  Surgeon: [name], MD;  Location: [place];  Service: General;  Laterality: Right;  . Breast surgery  11/15    lumpectomy RT breast  . Port-a-cath removal N/A 03/12/2015    Procedure: REMOVAL PORT-A-CATH;  Surgeon: [name], MD;  [facility];  Service: General;   Laterality: N/A;    There were no vitals filed for this visit.  Visit Diagnosis:  Bilateral low back pain without sciatica - Plan: PT plan of care cert/re-cert  Decreased ROM of trunk and back - Plan: PT plan of care cert/re-cert  Muscle, ligament, or fascia disorder - Plan: PT plan of care cert/re-cert  Stiffness of joint, shoulder region, right - Plan: PT plan of care cert/re-cert  Musculoskeletal chest pain - Plan: PT plan of care cert/re-cert      Subjective Assessment - 08/23/15 1400    Subjective Reports morning stiffness, especially in her fingers and having to work them to straighten them.  Only thing hurting today is the back.  That started when the weather got rainy yesterday. Had a busy weekend so did one but not all of the new exercises this weekend.  Breast area has been really wonderful--hasn't given me any pain, maybe just some stiffness.   Currently in Pain? Yes   Pain Score 7    Pain Location Back   Pain Orientation Right;Left;Lower   Pain Descriptors / Indicators Tightness   Aggravating Factors  rainy weather today   Pain Relieving Factors using pillow in her chair                         [facility] Adult PT Treatment/Exercise - 08/23/15 0001  Lumbar Exercises: Stretches   Passive Hamstring Stretch 1 rep;30 seconds  with belt   Single Knee to Chest Stretch 1 rep;30 seconds   Double Knee to Chest Stretch 1 rep;60 seconds   Lower Trunk Rotation 5 reps;10 seconds   Lower Trunk Rotation Limitations also did this with feet up off the mat x 5 each; also showed this with one leg on mat and straight and other leg straight and swing it over to the oppositie side, 30-60 seconds    Lumbar Exercises: Supine   Other Supine Lumbar Exercises pelvic tilt with knees apart, then together x 5;    Other Supine Lumbar Exercises pelvic tilt with right, then left leg extension and return to hooklying x 5; then pelvic tilt with marches x 10 x 5 sets   Lumbar Exercises:  Quadruped   [place] 5 reps   Other Quadruped Lumbar Exercises child's pose stretches straight, and with rotation right and left, 30+ seconds each   Knee/Hip Exercises: Standing   Other Standing Knee Exercises sit to/from standing with arms across chest x 10        Patient was also given information about yoga class options available to her as a cancer survivor.           Short Term Clinic Goals - 08/11/15 1526    CC Short Term Goal  #1   Title Low back pain reduced at least 25%.   Baseline "maybe 70?" on 08/11/15   Status Achieved   CC Short Term Goal  #2   Title right chest pain reduced at least 25%.   Baseline about 80% better on 08/11/15   Status Achieved             Long Term Clinic Goals - 08/23/15 2211    CC Long Term Goal  #1   Title Patient will report at least 75% decrease in low back pain.   Status Partially Met   CC Long Term Goal  #2   Title Patient will report at least 50% decrease in right chest area pain.   Status Achieved   CC Long Term Goal  #3   Title Independent in HEP for low back flexibility and chest stretches.   Status Achieved   CC Long Term Goal  #4   Title Trunk AROM limited no more than 10% any direction.   Status On-going            Plan - 08/23/15 2208    Clinical Impression Statement Patient with no pain at right chest area today, but with some continued low back pain, perhaps aggravated by the weather.  She does seem to be responding to exercise and reports significant improvement even though the back pain has not completely resolved.  She benefits from review and reinforcement of correct technique of exercise; she remains motivated and seems to be following through with home exercise program.   Pt will benefit from skilled therapeutic intervention in order to improve on the following deficits Decreased range of motion;Decreased scar mobility;Pain;Decreased strength   Rehab Potential Excellent   Clinical Impairments  Affecting Rehab Potential previous radiation    PT Frequency 2x / week   PT Duration 3 weeks  as needed   PT Treatment/Interventions Therapeutic exercise;Patient/family education   PT Next Visit Plan check goals; continue core strengthening and HEP education   Consulted and Agree with Plan of Care Patient        Problem List Patient Active Problem List  Diagnosis Date Noted  . Hypotension 05/18/2015  . Fatigue 05/18/2015  . Nausea with vomiting 07/23/2014  . Dehydration 07/23/2014  . GERD (gastroesophageal reflux disease) 07/23/2014  . Neutropenia ([place]) 07/23/2014  . Breast cancer of upper-outer quadrant of right female breast ([place]) 03/20/2014  . Palpitations 03/26/2013  . Hypertension 07/02/2011  . Hyperlipidemia 07/02/2011  . CONSTIPATION 01/22/2009  . FLATULENCE-GAS-BLOATING 01/22/2009    SALISBURY,DONNA 08/23/2015, 10:14 PM  [place], [place], [address] Phone: [phone]   Fax:  [phone]  Name: Mckenzie Sanford MRN: [id number] Date of Birth: Feb 07, 1956   Serafina Royals, PT 08/23/2015 10:14 PM

## 2015-08-25 ENCOUNTER — Encounter: Payer: 59 | Admitting: Physical Therapy

## 2015-08-30 ENCOUNTER — Other Ambulatory Visit: Payer: Self-pay | Admitting: *Deleted

## 2015-08-30 ENCOUNTER — Ambulatory Visit: Payer: 59 | Admitting: Physical Therapy

## 2015-08-30 DIAGNOSIS — C50411 Malignant neoplasm of upper-outer quadrant of right female breast: Secondary | ICD-10-CM

## 2015-08-30 MED ORDER — LETROZOLE 2.5 MG PO TABS: 2.5000 mg | ORAL_TABLET | Freq: Every day | ORAL | Status: DC

## 2015-08-30 NOTE — Telephone Encounter (Signed)
Pt c/o of increased joint pain and stiffness. Per Dr. [name] d/c anastrozole and start letrozole in 2 wks. Gave pt instructions. Received verbal understanding.

## 2015-09-06 ENCOUNTER — Ambulatory Visit: Payer: 59 | Attending: Hematology and Oncology | Admitting: Physical Therapy

## 2015-09-06 DIAGNOSIS — R29898 Other symptoms and signs involving the musculoskeletal system: Secondary | ICD-10-CM | POA: Diagnosis present

## 2015-09-06 DIAGNOSIS — M545 Low back pain: Secondary | ICD-10-CM | POA: Diagnosis present

## 2015-09-06 DIAGNOSIS — M5489 Other dorsalgia: Secondary | ICD-10-CM | POA: Diagnosis present

## 2015-09-06 NOTE — Patient Instructions (Signed)
Bracing With Bridging (Hook-Lying)    With neutral spine, tighten pelvic floor and abdominals and hold. Lift bottom.  While holding hips up, move knees apart, then back together.  Repeat _10__ times. Do __1_ times a day.  Lower Trunk Rotation    Bring both knees in to chest. Rotate from side to side, keeping knees together and feet off floor.  Hold for 10 seconds. Repeat __5__ times per set. Do __1-2__ sets per session. Do _1___ sessions per day.  http://orth.exer.us/151   Copyright  VHI. All rights reserved.     Copyright  VHI. All rights reserved.

## 2015-09-06 NOTE — Therapy (Signed)
[place], [place], [address] Phone: [phone]   Fax:  [phone]  Physical Therapy Treatment  Patient Details  Name: Mckenzie Sanford MRN: [id number] Date of Birth: Dec 26, 1955 Referring Provider: Dr. [name]  Encounter Date: 09/06/2015      PT End of Session - 09/06/15 1431    Visit Number 7   Number of Visits 12   Date for PT Re-Evaluation 09/22/15   Authorization Type April cert done   PT Start Time 1350   PT Stop Time 1436   PT Time Calculation (min) 46 min   Activity Tolerance Patient tolerated treatment well   Behavior During Therapy [facility] for tasks assessed/performed      Past Medical History  Diagnosis Date  . HTN (hypertension)   . Allergic rhinitis   . Hypercholesteremia   . IBS (irritable bowel syndrome)   . 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 Wears glasses   . Depression   . Radiation 11/23/14-01/07/15    Right breast  . Arthritis     in back  . Difficulty sleeping     takes Lorazepam as needed  . Anxiety     Past Surgical History  Procedure Laterality Date  . Dilation and curettage of uterus    . Cyst excision      right axilla  . Colonoscopy    . Tonsillectomy    . Radioactive seed guided mastectomy with axillary sentinel lymph node biopsy Right 04/20/2014    Procedure: RIGHT BREAST RADIOACTIVE SEED GUIDED LUMPECTOMY WITH RIGHT AXILLARY SENTINEL LYMPH NODE BIOPSY;  Surgeon: [name], MD;  Location: [place];  Service: General;  Laterality: Right;  . Portacath placement Right 06/01/2014    Procedure: ULTRASOUND GUIDED INSERTION PORT-A-CATH;  Surgeon: [name], MD;  Location: [place];  Service: General;  Laterality: Right;  . Breast surgery  11/15    lumpectomy RT breast  . Port-a-cath removal N/A 03/12/2015    Procedure: REMOVAL PORT-A-CATH;  Surgeon: [name], MD;   [facility];  Service: General;  Laterality: N/A;    There were no vitals filed for this visit.      Subjective Assessment - 09/06/15 1357    Subjective Doctor told her to decrease activity until she's feeling less tired; white blood count was low and needs more protein.  She has had some stressors.  Will be off anastrozole for two weeks, then try a substitute for two weeks due to her joing pain.  Back doesn't hurt as much since she's been off that med.   Currently in Pain? Yes   Pain Score 5    Pain Location Back   Pain Descriptors / Indicators --  "just a little"   Aggravating Factors  sitting in small children's chairs at work   Pain Relieving Factors doing stretches            [facility] PT Assessment - 09/06/15 0001    AROM   Lumbar Flexion WFL  moves slowly, looks stiff, reaches fingertips 6" to floor   Lumbar Extension 25% loss   Lumbar - Right Side Bend 10% loss   Lumbar - Left Side Bend 10% loss   Lumbar - Right Rotation WFL   Lumbar - Left Rotation [facility]                     [facility] Adult PT Treatment/Exercise - 09/06/15 0001  Exercises   Exercises --  reports doing various UE strengthening with 2 lb. weights   Lumbar Exercises: Stretches   Lower Trunk Rotation 5 reps;10 seconds  each way, feet up off the mat and hips at 90 degrees   Lower Trunk Rotation Limitations feels stretch at right chest with knees to left   Prone on Elbows Stretch 1 rep;60 seconds   Press Ups --  10 reps   Lumbar Exercises: Standing   Other Standing Lumbar Exercises standing AROM of trunk   Lumbar Exercises: Supine   Bridge 10 reps  with knees going apart, then together during bridge   Other Supine Lumbar Exercises pelvic tilt with 10 marching steps, 5 sets    Lumbar Exercises: Sidelying   Other Sidelying Lumbar Exercises tried side plank on right side several times, but unable   Lumbar Exercises: Quadruped   Plank plank on elbows 10 seconds x 3                 PT Education - 09/06/15 2219    Education provided Yes   Education Details bridging with taking knees apart and then together; supine lower trunk rotation with hips and knees at approx. 90 degrees   Person(s) Educated Patient   Methods Explanation;Demonstration;Handout;Verbal cues   Comprehension Verbalized understanding;Returned demonstration           Short Term Clinic Goals - 08/11/15 1526    CC Short Term Goal  #1   Title Low back pain reduced at least 25%.   Baseline "maybe 70?" on 08/11/15   Status Achieved   CC Short Term Goal  #2   Title right chest pain reduced at least 25%.   Baseline about 80% better on 08/11/15   Status Achieved             Long Term Clinic Goals - 09/06/15 1402    CC Long Term Goal  #1   Title Patient will report at least 75% decrease in low back pain.   Baseline Didn't have pain over the weekend until Sunday morning, 09/05/15. Pt. declines to quantify improvement today.   Status Partially Met   CC Long Term Goal  #2   Title Patient will report at least 50% decrease in right chest area pain.   Status Achieved   CC Long Term Goal  #3   Title Independent in HEP for low back flexibility and chest stretches.   Status Achieved   CC Long Term Goal  #4   Title Trunk AROM limited no more than 10% any direction.   Baseline limitations today included active trunk extension   Status On-going            Plan - 09/06/15 2221    Clinical Impression Statement Right chest area pain is now virtually gone.  Patient continues to report back pain; it is better but not gone, and patient has a difficult time trying to quantify her perceived improvement.     Rehab Potential Excellent   Clinical Impairments Affecting Rehab Potential previous radiation    PT Frequency 2x / week   PT Duration 6 weeks   PT Treatment/Interventions ADLs/Self Care Home Management;Electrical Stimulation;Moist Heat;DME Instruction;Therapeutic activities;Therapeutic  exercise;Balance training;Neuromuscular re-education;Patient/family education;Manual techniques;Scar mobilization;Passive range of motion   PT Next Visit Plan continue core strengthening and HEP education; consider transitioning to gym equipment as [facility] [employer] [facility]      Patient will benefit from skilled therapeutic  intervention in order to improve the following deficits and impairments:  Decreased range of motion, Decreased scar mobility, Pain, Decreased strength  Visit Diagnosis: Other dorsalgia - Plan: PT plan of care cert/re-cert  Other symptoms and signs involving the musculoskeletal system - Plan: PT plan of care cert/re-cert     Problem List Patient Active Problem List   Diagnosis Date Noted  . Hypotension 05/18/2015  . Fatigue 05/18/2015  . Nausea with vomiting 07/23/2014  . Dehydration 07/23/2014  . GERD (gastroesophageal reflux disease) 07/23/2014  . Neutropenia ([place]) 07/23/2014  . Breast cancer of upper-outer quadrant of right female breast ([place]) 03/20/2014  . Palpitations 03/26/2013  . Hypertension 07/02/2011  . Hyperlipidemia 07/02/2011  . CONSTIPATION 01/22/2009  . FLATULENCE-GAS-BLOATING 01/22/2009    SALISBURY,DONNA 09/06/2015, 10:38 PM  [place], [place], [address] Phone: [phone]   Fax:  [phone]  Name: Mckenzie Sanford MRN: [id number] Date of Birth: 07-31-1955    Serafina Royals, PT 09/06/2015 10:38 PM

## 2015-09-13 ENCOUNTER — Encounter: Payer: Self-pay | Admitting: *Deleted

## 2015-09-13 NOTE — Progress Notes (Signed)
Received office notes from [facility]. Reviewed by Dr. [name], sent to scan.

## 2015-09-14 ENCOUNTER — Ambulatory Visit: Payer: 59 | Admitting: Physical Therapy

## 2015-09-14 DIAGNOSIS — R29898 Other symptoms and signs involving the musculoskeletal system: Secondary | ICD-10-CM

## 2015-09-14 DIAGNOSIS — M5489 Other dorsalgia: Secondary | ICD-10-CM | POA: Diagnosis not present

## 2015-09-14 NOTE — Therapy (Signed)
[facility] [address] [place], [place], [address] Phone: [phone]   Fax:  [phone]  Physical Therapy Treatment  Patient Details  Name: Mckenzie Sanford MRN: [id number] Date of Birth: 06/14/1955 Referring Provider: Dr. Vinay Gudena  Encounter Date: 09/14/2015      PT End of Session - 09/14/15 1719    Visit Number 8   Number of Visits 12   Date for PT Re-Evaluation 09/22/15   Authorization Type April cert done   PT Start Time 1435   PT Stop Time 1516   PT Time Calculation (min) 41 min   Activity Tolerance Patient tolerated treatment well   Behavior During Therapy WFL for tasks assessed/performed      Past Medical History  Diagnosis Date  . HTN (hypertension)   . Allergic rhinitis   . Hypercholesteremia   . IBS (irritable bowel syndrome)   . 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 Wears glasses   . Depression   . Radiation 11/23/14-01/07/15    Right breast  . Arthritis     in back  . Difficulty sleeping     takes Lorazepam as needed  . Anxiety     Past Surgical History  Procedure Laterality Date  . Dilation and curettage of uterus    . Cyst excision      right axilla  . Colonoscopy    . Tonsillectomy    . Radioactive seed guided mastectomy with axillary sentinel lymph node biopsy Right 04/20/2014    Procedure: RIGHT BREAST RADIOACTIVE SEED GUIDED LUMPECTOMY WITH RIGHT AXILLARY SENTINEL LYMPH NODE BIOPSY;  Surgeon: Todd [name], MD;  Location: [facility];  Service: General;  Laterality: Right;  . Portacath placement Right 06/01/2014    Procedure: ULTRASOUND GUIDED INSERTION PORT-A-CATH;  Surgeon: Todd [name], MD;  Location: [facility];  Service: General;  Laterality: Right;  . Breast surgery  11/15    lumpectomy RT breast  . Port-a-cath removal N/A 03/12/2015    Procedure: REMOVAL PORT-A-CATH;  Surgeon: Todd [name], MD;   Location: [facility];  Service: General;  Laterality: N/A;    There were no vitals filed for this visit.      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   She has been walking about 3 times a week for 15 minutes. She says she still has some trouble with sleeping.    Pertinent History Right breast cancer with lumpectomy 04/20/14 with 3 lymph nodes removed.  Completed chemotherapy and radiation 01/07/15.  Went back to work 11/02/14.  HTN controlled with meds but still variable.  Hyperlipidemia.     Patient Stated Goals less pain on right side of chest and back   Currently in Pain? Yes   Pain Score 6    Pain Location Back   Pain Orientation Lower;Right   Pain Descriptors / Indicators Aching   Pain Type Chronic pain   Aggravating Factors  leaning over to work with the children                          [facility] Adult PT Treatment/Exercise - 09/14/15 0001    Lumbar Exercises: Stretches   Lower Trunk Rotation 2 reps;10 seconds   Pelvic Tilt 5 reps   Prone on Elbows Stretch 1 rep;20 seconds     Lumbar Exercises: Aerobic   Stationary Bike 3 minutes   level 3    Elliptical 3 minutes   .  O2 sats 98, HR 77    Lumbar Exercises: Standing   Forward Lunge 5 reps  each leg, holding on to dowel and counter top for balance    Lumbar Exercises: Seated   Other Seated Lumbar Exercises forward fold stretch for low back    Lumbar Exercises: Quadruped   Plank 5 reps of each leg extension, each arm extension, then opposite arm and leg extension                   Short Term Clinic Goals - 08/11/15 1526    CC Short Term Goal  #1   Title Low back pain reduced at least 25%.   Baseline "maybe 70?" on 08/11/15   Status Achieved   CC Short Term Goal  #2   Title right chest pain reduced at least 25%.   Baseline about 80% better on 08/11/15   Status Achieved             Long  Term Clinic Goals - 09/06/15 1402    CC Long Term Goal  #1   Title Patient will report at least 75% decrease in low back pain.   Baseline Didn't have pain over the weekend until Sunday morning, 09/05/15. Pt. declines to quantify improvement today.   Status Partially Met   CC Long Term Goal  #2   Title Patient will report at least 50% decrease in right chest area pain.   Status Achieved   CC Long Term Goal  #3   Title Independent in HEP for low back flexibility and chest stretches.   Status Achieved   CC Long Term Goal  #4   Title Trunk AROM limited no more than 10% any direction.   Baseline limitations today included active trunk extension   Status On-going            Plan - 09/14/15 1719    Clinical Impression Statement Pt continues with low back pain and c/o fatigue and poor sleeping.  Continued with strengthening and Upgraded to elliptical and stationary bike to work on general strength to work on both of these areas. she may benefit from a session with Pilates therapist to learm more about core activation     Clinical Impairments Affecting Rehab Potential previous radiation    PT Next Visit Plan continue core strengthening and HEP education; consider transitioning to gym equipment as patient would like to do [employer] in the future  consider Pilates session?      Patient will benefit from skilled therapeutic intervention in order to improve the following deficits and impairments:  Decreased range of motion, Decreased scar mobility, Pain, Decreased strength  Visit Diagnosis: Other dorsalgia  Other symptoms and signs involving the musculoskeletal system     Problem List Patient Active Problem List   Diagnosis Date Noted  . Hypotension 05/18/2015  . Fatigue 05/18/2015  . Nausea with vomiting 07/23/2014  . Dehydration 07/23/2014  . GERD (gastroesophageal reflux disease) 07/23/2014  . Neutropenia (HCC) 07/23/2014  . Breast cancer of upper-outer quadrant of right  female breast (HCC) 03/20/2014  . Palpitations 03/26/2013  . Hypertension 07/02/2011  . Hyperlipidemia 07/02/2011  . CONSTIPATION 01/22/2009  . FLATULENCE-GAS-BLOATING 01/22/2009     K. , PT  09/14/2015, 5:23 PM  [facility] [address] [place], [place], [address] Phone: [phone]     Fax:  [phone]  Name: Mckenzie Sanford MRN: [id number] Date of Birth: 11/05/1955     

## 2015-09-20 ENCOUNTER — Ambulatory Visit: Payer: 59 | Admitting: Physical Therapy

## 2015-09-20 DIAGNOSIS — M5489 Other dorsalgia: Secondary | ICD-10-CM | POA: Diagnosis not present

## 2015-09-20 DIAGNOSIS — R29898 Other symptoms and signs involving the musculoskeletal system: Secondary | ICD-10-CM

## 2015-09-20 DIAGNOSIS — M545 Low back pain, unspecified: Secondary | ICD-10-CM

## 2015-09-20 NOTE — Therapy (Addendum)
[place], [place], [address] Phone: [phone]   Fax:  [phone]  Physical Therapy Treatment  Patient Details  Name: Mckenzie Sanford MRN: [id number] Date of Birth: 10/21/1955 Referring Provider: Dr. [name]  Encounter Date: 09/20/2015      PT End of Session - 09/20/15 1535    Visit Number 9   Number of Visits 12   Date for PT Re-Evaluation 09/22/15   Authorization Type April cert done   PT Start Time 1350   PT Stop Time 1434   PT Time Calculation (min) 44 min   Activity Tolerance Patient tolerated treatment well   Behavior During Therapy [facility] for tasks assessed/performed      Past Medical History  Diagnosis Date  . HTN (hypertension)   . Allergic rhinitis   . Hypercholesteremia   . IBS (irritable bowel syndrome)   . Breast cancer ([place]) 03/12/14    right/invasive ductal carcinoma, DCIS  . Wears glasses   . Depression   . Radiation 11/23/14-01/07/15    Right breast  . Arthritis     in back  . Difficulty sleeping     takes Lorazepam as needed  . Anxiety     Past Surgical History  Procedure Laterality Date  . Dilation and curettage of uterus    . Cyst excision      right axilla  . Colonoscopy    . Tonsillectomy    . Radioactive seed guided mastectomy with axillary sentinel lymph node biopsy Right 04/20/2014    Procedure: RIGHT BREAST RADIOACTIVE SEED GUIDED LUMPECTOMY WITH RIGHT AXILLARY SENTINEL LYMPH NODE BIOPSY;  Surgeon: [name], MD;  Location: [facility];  Service: General;  Laterality: Right;  . Portacath placement Right 06/01/2014    Procedure: ULTRASOUND GUIDED INSERTION PORT-A-CATH;  Surgeon: [name], MD;  Location: [facility];  Service: General;  Laterality: Right;  . Breast surgery  11/15    lumpectomy RT breast  . Port-a-cath removal N/A 03/12/2015    Procedure: REMOVAL PORT-A-CATH;  Surgeon: [name], MD;   Location: [facility];  Service: General;  Laterality: N/A;    There were no vitals filed for this visit.      Subjective Assessment - 09/20/15 1354    Subjective Back continues to act up off and on; not bothering her today.  Reports her back is 70-75% better.   Currently in Pain? No/denies                         [facility] Adult PT Treatment/Exercise - 09/20/15 0001    Lumbar Exercises: Standing   Other Standing Lumbar Exercises physioball held out forward with both hands, squat down and lower ball toward floor, then raise ball up as knees straighten, and reach up overhead with the ball holding abdomen tight x 5   Lumbar Exercises: Seated   Other Seated Lumbar Exercises on physioball, hands across chest and tilt shoulders back slightly x 5; then both arms do D2 with backward lean while on ball x 5; then lean back slightly, take arms to right and then to left x 5 each way; then chop wiht 3 lb. weight x 5 each side   Lumbar Exercises: Supine   Bridge 10 reps  with legs up on yellow physioball   Bridge Limitations then bridge with legs on yellow physioball and pull feet towards buttocks x 5   Lumbar Exercises: Quadruped   Opposite  Arm/Leg Raise Right arm/Left leg;Left arm/Right leg;5 reps  over yellow physioball   Knee/Hip Exercises: Aerobic   Stationary Bike L2 x approx. 4 mins.  moderate dyspnea; c/o thigh fatigue; SpO2 98, HR 75   Elliptical Quick start x 4 minutes  moderate dyspnea   Knee/Hip Exercises: Standing   Wall Squat 10 reps  with yellow physioball                   Short Term Clinic Goals - 08/11/15 1526    CC Short Term Goal  #1   Title Low back pain reduced at least 25%.   Baseline "maybe 70?" on 08/11/15   Status Achieved   CC Short Term Goal  #2   Title right chest pain reduced at least 25%.   Baseline about 80% better on 08/11/15   Status Achieved             Long Term Clinic Goals - 09/20/15 1546    CC Long Term Goal  #1   Title  Patient will report at least 75% decrease in low back pain.   Baseline 70-75% improved as of 09/20/15   Status Partially Met   CC Long Term Goal  #2   Title Patient will report at least 50% decrease in right chest area pain.   Status Achieved   CC Long Term Goal  #3   Title Independent in HEP for low back flexibility and chest stretches.   Status Achieved   CC Long Term Goal  #4   Title Trunk AROM limited no more than 10% any direction.   Status On-going            Plan - 09/20/15 1430    Clinical Impression Statement Pt. talks about gym exercises and it is clear there are many things she doesn't like to do. She will call to check on the [employer] program.  Pt. reports feeling her back is 70-75% improved overall.    Rehab Potential Excellent   Clinical Impairments Affecting Rehab Potential previous radiation    PT Frequency 2x / week   PT Duration 6 weeks   PT Treatment/Interventions ADLs/Self Care Home Management;Electrical Stimulation;Moist Heat;DME Instruction;Therapeutic activities;Therapeutic exercise;Balance training;Neuromuscular re-education;Patient/family education;Manual techniques;Scar mobilization;Passive range of motion   PT Next Visit Plan continue core strengthening and helping patient figure out which exercise program at the gym she would be likely to follow up with/that she would enjoy doing      Patient will benefit from skilled therapeutic intervention in order to improve the following deficits and impairments:  Decreased range of motion, Decreased scar mobility, Pain, Decreased strength  Visit Diagnosis: Other symptoms and signs involving the musculoskeletal system  Bilateral low back pain without sciatica     Problem List Patient Active Problem List   Diagnosis Date Noted  . Hypotension 05/18/2015  . Fatigue 05/18/2015  . Nausea with vomiting 07/23/2014  . Dehydration 07/23/2014  . GERD (gastroesophageal reflux disease) 07/23/2014  .  Neutropenia ([place]) 07/23/2014  . Breast cancer of upper-outer quadrant of right female breast ([place]) 03/20/2014  . Palpitations 03/26/2013  . Hypertension 07/02/2011  . Hyperlipidemia 07/02/2011  . CONSTIPATION 01/22/2009  . FLATULENCE-GAS-BLOATING 01/22/2009    SALISBURY,DONNA 09/20/2015, 3:47 PM  [place], [place], [address] Phone: [phone]   Fax:  [phone]  Name: GLENNIE BOSE MRN: [id number] Date of Birth: Jul 23, 1955    Serafina Royals, PT 09/20/2015 3:47 PM  Addendum:  Patient called on 10/01/15 to report that she had seen a doctor about her thumb pain and had been told to stop therapy for the time being.  She said she would call to reschedule when okayed.  She did return with new orders on 12/28/15, so this episode is being discontinued and a new episode started as of that date.  PHYSICAL THERAPY DISCHARGE SUMMARY  Visits from Start of Care: 9  Current functional level related to goals / functional outcomes: Goals for this episode partially met (nearly fully met) as noted above.   Remaining deficits: Ongoing back stiffness, especially in the mornings.   Education / Equipment: Home exercise program. Plan: Patient agrees to discharge.  Patient goals were partially met. Patient is being discharged due to a change in medical status.  See note above.  Serafina Royals, PT 12/30/15 9:11 AM ?????

## 2015-09-26 ENCOUNTER — Other Ambulatory Visit: Payer: Self-pay | Admitting: Nurse Practitioner

## 2015-09-26 ENCOUNTER — Other Ambulatory Visit: Payer: Self-pay | Admitting: Hematology and Oncology

## 2015-09-27 ENCOUNTER — Other Ambulatory Visit: Payer: Self-pay | Admitting: *Deleted

## 2015-09-27 DIAGNOSIS — C50411 Malignant neoplasm of upper-outer quadrant of right female breast: Secondary | ICD-10-CM

## 2015-09-27 MED ORDER — TRAMADOL HCL 50 MG PO TABS: 50.0000 mg | ORAL_TABLET | Freq: Two times a day (BID) | ORAL | Status: DC | PRN

## 2015-09-28 ENCOUNTER — Encounter: Payer: Self-pay | Admitting: *Deleted

## 2015-09-28 NOTE — Progress Notes (Signed)
Received bone density report from [facility], reviewed by Dr. Gudena, sent to scan. 

## 2015-10-01 ENCOUNTER — Telehealth: Payer: Self-pay | Admitting: Physical Therapy

## 2015-10-01 NOTE — Telephone Encounter (Signed)
Patient had called and left a message to call back.  She reported that she had seen a doctor about her sore wrist and had been told to stop therapy until the wrist was better.  She wanted to know about whether she could come back to therapy once the wrist was healed, and was told she could do that and that we would need to do a renewal.  She will call to tell us when she is ready to start back.

## 2015-11-07 ENCOUNTER — Other Ambulatory Visit: Payer: Self-pay | Admitting: Hematology and Oncology

## 2015-11-08 ENCOUNTER — Other Ambulatory Visit: Payer: Self-pay

## 2015-11-08 DIAGNOSIS — C50411 Malignant neoplasm of upper-outer quadrant of right female breast: Secondary | ICD-10-CM

## 2015-11-08 MED ORDER — TRAMADOL HCL 50 MG PO TABS: 50.0000 mg | ORAL_TABLET | Freq: Two times a day (BID) | ORAL | Status: DC | PRN

## 2015-11-09 ENCOUNTER — Other Ambulatory Visit: Payer: Self-pay | Admitting: Hematology and Oncology

## 2015-11-14 ENCOUNTER — Other Ambulatory Visit: Payer: Self-pay | Admitting: Hematology and Oncology

## 2015-12-16 ENCOUNTER — Encounter: Payer: Self-pay | Admitting: *Deleted

## 2015-12-28 ENCOUNTER — Ambulatory Visit: Payer: 59 | Admitting: Physical Therapy

## 2015-12-28 ENCOUNTER — Ambulatory Visit: Payer: 59 | Attending: Sports Medicine | Admitting: Physical Therapy

## 2015-12-28 DIAGNOSIS — M25542 Pain in joints of left hand: Secondary | ICD-10-CM

## 2015-12-28 DIAGNOSIS — M545 Low back pain, unspecified: Secondary | ICD-10-CM

## 2015-12-28 DIAGNOSIS — M25532 Pain in left wrist: Secondary | ICD-10-CM | POA: Insufficient documentation

## 2015-12-28 DIAGNOSIS — M25642 Stiffness of left hand, not elsewhere classified: Secondary | ICD-10-CM | POA: Insufficient documentation

## 2015-12-28 DIAGNOSIS — R29898 Other symptoms and signs involving the musculoskeletal system: Secondary | ICD-10-CM | POA: Insufficient documentation

## 2015-12-28 NOTE — Therapy (Signed)
[place], [place], [address] Phone: [phone]   Fax:  [phone]  Physical Therapy Evaluation  Patient Details  Name: Mckenzie Sanford MRN: [id number] Date of Birth: 1955/08/17 Referring Provider: Dr. Vickki Hearing  Encounter Date: 12/28/2015      PT End of Session - 12/28/15 1756    Visit Number 1   Number of Visits 9   Date for PT Re-Evaluation 02/04/16   PT Start Time [id number]   PT Stop Time 1350   PT Time Calculation (min) 47 min   Activity Tolerance Patient tolerated treatment well   Behavior During Therapy [facility] for tasks assessed/performed      Past Medical History:  Diagnosis Date  . Allergic rhinitis   . Anxiety   . Arthritis    in back  . Breast cancer ([place]) 03/12/14   right/invasive ductal carcinoma, DCIS  . Depression   . Difficulty sleeping    takes Lorazepam as needed  . HTN (hypertension)   . Hypercholesteremia   . IBS (irritable bowel syndrome)   . Radiation 11/23/14-01/07/15   Right breast  . Wears glasses     Past Surgical History:  Procedure Laterality Date  . BREAST SURGERY  11/15   lumpectomy RT breast  . COLONOSCOPY    . CYST EXCISION     right axilla  . DILATION AND CURETTAGE OF UTERUS    . PORT-A-CATH REMOVAL N/A 03/12/2015   Procedure: REMOVAL PORT-A-CATH;  Surgeon: [name], MD;  Location: [facility];  Service: General;  Laterality: N/A;  . PORTACATH PLACEMENT Right 06/01/2014   Procedure: ULTRASOUND GUIDED INSERTION PORT-A-CATH;  Surgeon: [name], MD;  Location: [facility];  Service: General;  Laterality: Right;  . RADIOACTIVE SEED GUIDED MASTECTOMY WITH AXILLARY SENTINEL LYMPH NODE BIOPSY Right 04/20/2014   Procedure: RIGHT BREAST RADIOACTIVE SEED GUIDED LUMPECTOMY WITH RIGHT AXILLARY SENTINEL LYMPH NODE BIOPSY;  Surgeon: [name], MD;  Location: [facility];  Service: General;  Laterality: Right;  . TONSILLECTOMY       There were no vitals filed for this visit.       Subjective Assessment - 12/28/15 1315    Subjective Was being seen here for back problems but had hand pain and was told by Dr. Delilah Shan to stop doing therapy for at least six weeks (she also stopped home exercise program except walking); she now returns with a prescription from Dr. Delilah Shan for therapy for left thumb pain and left wrist flexor tenosynovitis. Back has has been doing somewhat better, but just is stiff in the mornings consistently.     Pertinent History Right breast cancer with lumpectomy 04/20/14 with 3 lymph nodes removed.  Completed chemotherapy and radiation 01/07/15.  Went back to work 11/02/14.  HTN controlled with meds but still variable.  Hyperlipidemia.  Was being seen here for back pain earlier this year, but developed left wrist pain, which she is now referred for.  She had an injection to the left wrist for pain there about two months ago and had a brace at that time; later developed base of thumb pain and now has a smaller velcro thumb brace.     Patient Stated Goals Wants the thumb and wrist to feel better, and her back too.   Currently in Pain? Yes   Pain Location Finger (Comment which one)  base of left thumb, palmar aspect   Pain Orientation Left   Pain Descriptors / Indicators Tender  Pain Onset More than a month ago   Aggravating Factors  pressing on the sore spot, working with the children   Pain Relieving Factors using ice (uses it about 3x/week), wearing the immobilization brace, walking   Effect of Pain on Daily Activities interferes a bit with driving and with doing her job   Multiple Pain Sites Yes   Pain Score 0   Pain Location Back   Pain Descriptors / Indicators Other (Comment)  stiff   Aggravating Factors  worse first thing in the morning   Pain Relieving Factors muscle relaxant, tramadol, or advil            [facility] PT Assessment - 12/28/15 0001      Assessment   Medical Diagnosis left  wrist tenosynovitis and left thumb pain   Referring Provider Dr. Vickki Hearing   Onset Date/Surgical Date 09/27/15  approx.   Hand Dominance Right     Precautions   Precautions Other (comment)   Precaution Comments cancer precautions   Required Braces or Orthoses Other Brace/Splint   Other Brace/Splint left thumb immobilizer     Restrictions   Weight Bearing Restrictions No     Balance Screen   Has the patient fallen in the past 6 months No   Has the patient had a decrease in activity level because of a fear of falling?  No   Is the patient reluctant to leave their home because of a fear of falling?  No     Home Environment   Living Environment Private residence   Living Arrangements Other relatives  sister   Type of [place] to enter   Entrance Stairs-Number of Steps two   [place] One level     Prior Function   Level of Independence Independent with basic ADLs;Independent with homemaking with ambulation;Independent with gait   Vocation Part time employment  6 hours, 5 days a week   Vocation Requirements teaches preschool     Cognition   Overall Cognitive Status Within Functional Limits for tasks assessed     ROM / Strength   AROM / PROM / Strength Strength     AROM   AROM Assessment Site Finger   Right/Left Finger Right  thumbs WFL bilat. with slight decrease in left flex/extensio     Strength   Overall Strength Comments left thumb grossly 4/5 with pain on resisted flexion; right thumb 5/5 and not painful   Strength Assessment Site Wrist;Hand   Right/Left Wrist Left   Left Wrist Flexion 5/5   Left Wrist Extension 5/5   Left Wrist Radial Deviation 5/5   Left Wrist Ulnar Deviation 5/5   Right/Left hand Right;Left   Right Hand Grip (lbs) 27  dynamometer on 3rd setting   Left Hand Grip (lbs) 20     Palpation   Palpation comment palpation at left base of thumb indicates mild swelling compared to right, and left lateral wrist  unremarkable                           PT Education - 12/28/15 1755    Education provided Yes   Education Details continue to use cold pack for left wrist and thumb pain, suggesting after work and at the end of her day for 10 minutes   Person(s) Educated Patient   Methods Explanation   Comprehension Verbalized understanding           Short  Term Clinic Goals - 08/11/15 1526      CC Short Term Goal  #1   Title Low back pain reduced at least 25%.   Baseline "maybe 70?" on 08/11/15   Status Achieved     CC Short Term Goal  #2   Title right chest pain reduced at least 25%.   Baseline about 80% better on 08/11/15   Status Achieved             Long Term Clinic Goals - 12/28/15 1805      CC Long Term Goal  #1   Title Patient will report at least 75% decrease in left base of thumb pain.   Time 4   Period Weeks   Status New     CC Long Term Goal  #2   Title Patient will be independent in home exercise program and pain management for left hand and wrist.   Time 4   Period Weeks   Status New            Plan - 12/28/15 1756    Clinical Impression Statement Patient who is known to this clinic for prior episode of care related mostly to back and other pain, returns now with new prescription for left wrist tenosynovitis and thumb pain.  Her wrist is doing better since she had an injection in it, so the base of thumb pain is her main complaint.  It is tender to the touch.  ROM of the thumb is mildly limited; strength is limited in both hands for gross grasp, but left limited more than right.  She also complains of some continued back pain, mainly stiffness in the mornings, for which she does still take medications.  She comes to our cancer rehab clinic because of a history of right breast cancer.   Rehab Potential Good   PT Frequency 2x / week   PT Duration 4 weeks   PT Treatment/Interventions ADLs/Self Care Home Management;Electrical  Stimulation;Iontophoresis 4mg /ml Dexamethasone;Moist Heat;DME Instruction;Therapeutic exercise;Patient/family education;Manual techniques;Passive range of motion;Taping;Cryotherapy   PT Next Visit Plan Consider iontophoresis to base of left thumb if approved by MD; consider contrast baths and cold pack.  Gentle P/AA/AROM of left thumb and progressive strengthening to same, including HEP.  Cross friction massage.   Consulted and Agree with Plan of Care Patient      Patient will benefit from skilled therapeutic intervention in order to improve the following deficits and impairments:  Pain, Decreased range of motion, Decreased strength, Impaired UE functional use  Visit Diagnosis: Pain in joints of left hand - Plan: PT plan of care cert/re-cert  Other symptoms and signs involving the musculoskeletal system - Plan: PT plan of care cert/re-cert  Pain in left wrist - Plan: PT plan of care cert/re-cert  Stiffness of left hand, not elsewhere classified - Plan: PT plan of care cert/re-cert  Bilateral low back pain without sciatica - Plan: PT plan of care cert/re-cert     Problem List Patient Active Problem List   Diagnosis Date Noted  . Hypotension 05/18/2015  . Fatigue 05/18/2015  . Nausea with vomiting 07/23/2014  . Dehydration 07/23/2014  . GERD (gastroesophageal reflux disease) 07/23/2014  . Neutropenia ([place]) 07/23/2014  . Breast cancer of upper-outer quadrant of right female breast ([place]) 03/20/2014  . Palpitations 03/26/2013  . Hypertension 07/02/2011  . Hyperlipidemia 07/02/2011  . CONSTIPATION 01/22/2009  . FLATULENCE-GAS-BLOATING 01/22/2009    Geralynn Capri 12/28/2015, 6:11 PM  [place]  [place], [place], [address] Phone: [phone]   Fax:  [phone]  Name: AMAI WHITTON MRN: [id number] Date of Birth: 1955/11/30  Serafina Royals, PT 12/28/15 6:11 PM

## 2015-12-31 ENCOUNTER — Encounter: Payer: Self-pay | Admitting: Physical Therapy

## 2015-12-31 ENCOUNTER — Ambulatory Visit: Payer: 59 | Admitting: Physical Therapy

## 2015-12-31 DIAGNOSIS — M25542 Pain in joints of left hand: Secondary | ICD-10-CM

## 2015-12-31 NOTE — Patient Instructions (Signed)
You may do these exercises when it is not painful to move your hand.  Opposition stretch: Rest your hand on a table, palm up. Touch the tip of your thumb to the tip of your little finger. Hold this position for 6 seconds and then release. Repeat 10 times.   Wrist stretch: Press the back of the hand on your injured side with your other hand to help bend your wrist. Hold for 15 to 30 seconds. Next, stretch the hand back by pressing the fingers in a backward direction. Hold for 15 to 30 seconds. Keep the arm on your injured side straight during this exercise. Do 3 sets.   Wrist flexion: Hold a can or hammer handle in your hand with your palm facing up. Bend your wrist upward. Slowly lower the weight and return to the starting position. Do 2 sets of 15. Gradually increase the weight of the can or weight you are holding.   Wrist radial deviation strengthening: Put your wrist in the sideways position with your thumb up. Hold a can of soup or a hammer handle and gently bend your wrist up, with the thumb reaching toward the ceiling. Slowly lower to the starting position. Do not move your forearm throughout this exercise. Do 2 sets of 15.   Wrist extension: Hold a soup can or hammer handle in your hand with your palm facing down. Slowly bend your wrist up. Slowly lower the weight down into the starting position. Do 2 sets of 15. Gradually increase the weight of the object you are holding.   Grip strengthening: Squeeze a soft rubber ball and hold the squeeze for 5 seconds. Do 2 sets of 15.   Finger spring: Place a large rubber band around the outside of your thumb and fingers. Open your fingers to stretch the rubber band. Do 2 sets of 15.

## 2015-12-31 NOTE — Therapy (Signed)
[place], [place], [address] Phone: [phone]   Fax:  [phone]  Physical Therapy Treatment  Patient Details  Name: Mckenzie Sanford MRN: [id number] Date of Birth: September 06, 1955 Referring Provider: Dr. Vickki Hearing  Encounter Date: 12/31/2015      PT End of Session - 12/31/15 1155    Visit Number 2   Number of Visits 9   Date for PT Re-Evaluation 02/04/16   PT Start Time 1017   PT Stop Time 1102   PT Time Calculation (min) 45 min   Activity Tolerance Patient tolerated treatment well   Behavior During Therapy [facility] for tasks assessed/performed      Past Medical History:  Diagnosis Date  . Allergic rhinitis   . Anxiety   . Arthritis    in back  . 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 Depression   . Difficulty sleeping    takes Lorazepam as needed  . HTN (hypertension)   . Hypercholesteremia   . IBS (irritable bowel syndrome)   . Radiation 11/23/14-01/07/15   Right breast  . Wears glasses     Past Surgical History:  Procedure Laterality Date  . BREAST SURGERY  11/15   lumpectomy RT breast  . COLONOSCOPY    . CYST EXCISION     right axilla  . DILATION AND CURETTAGE OF UTERUS    . PORT-A-CATH REMOVAL N/A 03/12/2015   Procedure: REMOVAL PORT-A-CATH;  Surgeon: [name], MD;  [facility];  Service: General;  Laterality: N/A;  . PORTACATH PLACEMENT Right 06/01/2014   Procedure: ULTRASOUND GUIDED INSERTION PORT-A-CATH;  Surgeon: [name], MD;  Location: [place];  Service: General;  Laterality: Right;  . RADIOACTIVE SEED GUIDED MASTECTOMY WITH AXILLARY SENTINEL LYMPH NODE BIOPSY Right 04/20/2014   Procedure: RIGHT BREAST RADIOACTIVE SEED GUIDED LUMPECTOMY WITH RIGHT AXILLARY SENTINEL LYMPH NODE BIOPSY;  Surgeon: [name], MD;  Location: [place];  Service: General;  Laterality: Right;  . TONSILLECTOMY       There were no vitals filed for this visit.      Subjective Assessment - 12/31/15 1018    Subjective I am in a lot of pain today. Where the thumb is I have the pain and my back hurts really bad. My job has me out for today.    Pertinent History Right breast cancer with lumpectomy 04/20/14 with 3 lymph nodes removed.  Completed chemotherapy and radiation 01/07/15.  Went back to work 11/02/14.  HTN controlled with meds but still variable.  Hyperlipidemia.  Was being seen here for back pain earlier this year, but developed left wrist pain, which she is now referred for.  She had an injection to the left wrist for pain there about two months ago and had a brace at that time; later developed base of thumb pain and now has a smaller velcro thumb brace.     Patient Stated Goals Wants the thumb and wrist to feel better, and her back too.   Currently in Pain? Yes   Pain Score 9    Pain Location Back   Pain Descriptors / Indicators Tightness   Pain Type Chronic pain   Multiple Pain Sites Yes   Pain Score 5   Pain Location Wrist   Pain Orientation Left                         [facility] Adult  PT Treatment/Exercise - 12/31/15 0001      Hand Exercises   MCPJ Flexion PROM;Left  thumb   MCPJ Extension PROM;Left  thumb   DIPJ Flexion PROM;Left  thumb   DIPJ Extension PROM;Left  thumb   Opposition PROM;Left;Supine;Seated;Squeeze ball  PROM in supine, ball squeeze in sitting   Thumb Opposition PROM in supine and active exercise in sititng x 5 reps with 10 sec holds   Rubberbands in sitting with hairband around fingers spreading fingers apart x 10     Wrist Exercises   Forearm Supination PROM;Left;Supine   Forearm Pronation PROM;Left;Supine   Wrist Flexion PROM;Left;Strengthening;10 reps;Supine;Seated;Bar weights/barbell  prom in supine, seated wrist flexion with 1 lb weight   Bar Weights/Barbell (Wrist Flexion) 1 lb   Wrist Extension PROM;Strengthening;Left;10  reps;Supine;Seated;Bar weights/barbell  PROM in supine, seated wrist extension with 1 lb x 10 reps   Bar Weights/Barbell (Wrist Extension) 1 lb   Wrist Radial Deviation PROM;AROM;Left;10 reps;Supine;Seated  PROM in supine, seated AROM with pt having difficulty   Wrist Ulnar Deviation PROM;Left;Supine     Manual Therapy   Manual therapy comments see above under wrist and thumb exercises PROM performed                PT Education - 12/31/15 1155    Education provided Yes   Education Details wrist exercises and thumb exercises - see instruction section   Person(s) Educated Patient   Methods Explanation;Demonstration;Handout   Comprehension Verbalized understanding;Returned demonstration                [facility] Goals - 12/28/15 1805      CC Long Term Goal  #1   Title Patient will report at least 75% decrease in left base of thumb pain.   Time 4   Period Weeks   Status New     CC Long Term Goal  #2   Title Patient will be independent in home exercise program and pain management for left hand and wrist.   Time 4   Period Weeks   Status New            Plan - 12/31/15 1156    Clinical Impression Statement Pt tolerated PROM very well this session. She does have some tightness especially with radial deviation. She was educated in a home exercise program for wrist ROM and strength and thumb ROM and strengthening. She was educated to stop exercises if there is pain but she may have some discomfort from stretching.    Rehab Potential Good   PT Frequency 2x / week   PT Duration 4 weeks   PT Treatment/Interventions ADLs/Self Care Home Management;Electrical Stimulation;Iontophoresis 4mg /ml Dexamethasone;Moist Heat;DME Instruction;Therapeutic exercise;Patient/family education;Manual techniques;Passive range of motion;Taping;Cryotherapy   PT Next Visit Plan Consider iontophoresis to base of left thumb if approved by MD; consider contrast baths and cold pack.   Gentle P/AA/AROM of left thumb and progressive strengthening to same, including HEP.  Cross friction massage.   Consulted and Agree with Plan of Care Patient      Patient will benefit from skilled therapeutic intervention in order to improve the following deficits and impairments:  Pain, Decreased range of motion, Decreased strength, Impaired UE functional use  Visit Diagnosis: Pain in joints of left hand     Problem List Patient Active Problem List   Diagnosis Date Noted  . Hypotension 05/18/2015  . Fatigue 05/18/2015  . Nausea with vomiting 07/23/2014  . Dehydration 07/23/2014  . GERD (gastroesophageal reflux disease) 07/23/2014  .  Neutropenia ([place]) 07/23/2014  . Breast cancer of upper-outer quadrant of right female breast ([place]) 03/20/2014  . Palpitations 03/26/2013  . Hypertension 07/02/2011  . Hyperlipidemia 07/02/2011  . CONSTIPATION 01/22/2009  . FLATULENCE-GAS-BLOATING 01/22/2009    Alexia Freestone 12/31/2015, 11:58 AM  [place], [place], [address] Phone: [phone]   Fax:  [phone]  Name: CARIANNA LEVELL MRN: [id number] Date of Birth: [birth year]  Allyson Sabal, PT 12/31/15 11:58 AM

## 2016-01-04 ENCOUNTER — Ambulatory Visit: Payer: 59 | Admitting: Physical Therapy

## 2016-01-04 DIAGNOSIS — M25532 Pain in left wrist: Secondary | ICD-10-CM

## 2016-01-04 DIAGNOSIS — M25542 Pain in joints of left hand: Secondary | ICD-10-CM | POA: Diagnosis not present

## 2016-01-04 DIAGNOSIS — M25642 Stiffness of left hand, not elsewhere classified: Secondary | ICD-10-CM

## 2016-01-04 DIAGNOSIS — R29898 Other symptoms and signs involving the musculoskeletal system: Secondary | ICD-10-CM

## 2016-01-04 NOTE — Therapy (Signed)
[place], [place], [address] Phone: [phone]   Fax:  [phone]  Physical Therapy Treatment  Patient Details  Name: Mckenzie Sanford MRN: [id number] Date of Birth: June 12, 1955 Referring Provider: Dr. Vickki Hearing  Encounter Date: 01/04/2016      PT End of Session - 01/04/16 1759    Visit Number 3   Number of Visits 9   Date for PT Re-Evaluation 02/04/16   PT Start Time 1435   PT Stop Time 1515   PT Time Calculation (min) 40 min   Activity Tolerance Patient tolerated treatment well   Behavior During Therapy [facility] for tasks assessed/performed      Past Medical History:  Diagnosis Date  . Allergic rhinitis   . Anxiety   . Arthritis    in back  . 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 Depression   . Difficulty sleeping    takes Lorazepam as needed  . HTN (hypertension)   . Hypercholesteremia   . IBS (irritable bowel syndrome)   . Radiation 11/23/14-01/07/15   Right breast  . Wears glasses     Past Surgical History:  Procedure Laterality Date  . BREAST SURGERY  11/15   lumpectomy RT breast  . COLONOSCOPY    . CYST EXCISION     right axilla  . DILATION AND CURETTAGE OF UTERUS    . PORT-A-CATH REMOVAL N/A 03/12/2015   Procedure: REMOVAL PORT-A-CATH;  Surgeon: [name], MD;  [facility];  Service: General;  Laterality: N/A;  . PORTACATH PLACEMENT Right 06/01/2014   Procedure: ULTRASOUND GUIDED INSERTION PORT-A-CATH;  Surgeon: [name], MD;  Location: [place];  Service: General;  Laterality: Right;  . RADIOACTIVE SEED GUIDED MASTECTOMY WITH AXILLARY SENTINEL LYMPH NODE BIOPSY Right 04/20/2014   Procedure: RIGHT BREAST RADIOACTIVE SEED GUIDED LUMPECTOMY WITH RIGHT AXILLARY SENTINEL LYMPH NODE BIOPSY;  Surgeon: [name], MD;  Location: [place];  Service: General;  Laterality: Right;  . TONSILLECTOMY       There were no vitals filed for this visit.      Subjective Assessment - 01/04/16 1447    Subjective "I can feel it popping" mostly at night when she bends her thumb  She wears her splint most of the time, epecially when she is driving. She has been doing the exercises and has been doing better.  She is reluctant to say that her hand is better because it still hurts.    Pertinent History Right breast cancer with lumpectomy 04/20/14 with 3 lymph nodes removed.  Completed chemotherapy and radiation 01/07/15.  Went back to work 11/02/14.  HTN controlled with meds but still variable.  Hyperlipidemia.  Was being seen here for back pain earlier this year, but developed left wrist pain, which she is now referred for.  She had an injection to the left wrist for pain there about two months ago and had a brace at that time; later developed base of thumb pain and now has a smaller velcro thumb brace.     Currently in Pain? Yes   Pain Score 5   goes up to 8 if she bumps it or something itrritaes                          [facility] Adult PT Treatment/Exercise - 01/04/16 0001      Elbow Exercises   Forearm Supination PROM;Left;Supine  used  velcro activity board for hand motions for 5 minutes    Forearm Pronation PROM;Left;Supine   Wrist Flexion PROM;Left;Strengthening;10 reps;Supine;Seated;Bar weights/barbell   Wrist Extension PROM;Strengthening;Left;10 reps;Supine;Seated;Bar weights/barbell     Hand Exercises   MCPJ Flexion PROM;AAROM;Left;10 reps   MCPJ Extension PROM;AAROM;Left;10 reps   Opposition PROM;AAROM;Left;10 reps   Digit Abduction/Adduction isometric thumb adduction 10 second hold for 5 reps.      Modalities   Modalities Cryotherapy     Cryotherapy   Number Minutes Cryotherapy 15 Minutes  15   Cryotherapy Location Hand  left   Type of Cryotherapy Ice pack     Manual Therapy   Manual Therapy Soft tissue mobilization;Passive ROM   Soft tissue mobilization cross  friction massage at 1st MCP joint ,   Passive ROM to thumb in opposition and extension.and at DIP and PIP joints                         [facility] Goals - 12/28/15 1805      CC Long Term Goal  #1   Title Patient will report at least 75% decrease in left base of thumb pain.   Time 4   Period Weeks   Status New     CC Long Term Goal  #2   Title Patient will be independent in home exercise program and pain management for left hand and wrist.   Time 4   Period Weeks   Status New            Plan - 01/04/16 1800    Clinical Impression Statement Pt is doing better with strength and still needs some assist to achieve full ROM of Left MCP joint She tolerated cross friction massage, but does have tendernss to touch at radial side of wrist and base of thumb.  Swelling evident in thenar webspace    Rehab Potential Good   Clinical Impairments Affecting Rehab Potential none   PT Treatment/Interventions ADLs/Self Care Home Management;Electrical Stimulation;Iontophoresis 4mg /ml Dexamethasone;Moist Heat;DME Instruction;Therapeutic exercise;Patient/family education;Manual techniques;Passive range of motion;Taping;Cryotherapy   PT Next Visit Plan Consider iontophoresis to base of left thumb continue exercise stretching and  Cross friction massage, and cold pack consider use of kinestiotape for support and swelling ????   Consulted and Agree with Plan of Care Patient      Patient will benefit from skilled therapeutic intervention in order to improve the following deficits and impairments:  Pain, Decreased range of motion, Decreased strength, Impaired UE functional use  Visit Diagnosis: Pain in joints of left hand  Other symptoms and signs involving the musculoskeletal system  Pain in left wrist  Stiffness of left hand, not elsewhere classified     Problem List Patient Active Problem List   Diagnosis Date Noted  . Hypotension 05/18/2015  . Fatigue 05/18/2015  .  Nausea with vomiting 07/23/2014  . Dehydration 07/23/2014  . GERD (gastroesophageal reflux disease) 07/23/2014  . Neutropenia ([place]) 07/23/2014  . Breast cancer of upper-outer quadrant of right female breast ([place]) 03/20/2014  . Palpitations 03/26/2013  . Hypertension 07/02/2011  . Hyperlipidemia 07/02/2011  . CONSTIPATION 01/22/2009  . FLATULENCE-GAS-BLOATING 01/22/2009   Donato Heinz. Owens Shark PT   Norwood Levo 01/04/2016, 6:05 PM  [place], [place], [address] Phone: [phone]   Fax:  [phone]  Name: Mckenzie Sanford MRN: [id number] Date of Birth: 1955-11-13

## 2016-01-06 ENCOUNTER — Encounter: Payer: 59 | Admitting: Physical Therapy

## 2016-01-10 ENCOUNTER — Ambulatory Visit: Payer: 59 | Admitting: Physical Therapy

## 2016-01-10 DIAGNOSIS — R29898 Other symptoms and signs involving the musculoskeletal system: Secondary | ICD-10-CM

## 2016-01-10 DIAGNOSIS — M25542 Pain in joints of left hand: Secondary | ICD-10-CM

## 2016-01-10 NOTE — Therapy (Signed)
[place], [place], [address] Phone: [phone]   Fax:  [phone]  Physical Therapy Treatment  Patient Details  Name: Mckenzie Sanford MRN: [id number] Date of Birth: 1956-04-23 Referring Provider: Dr. Vickki Hearing  Encounter Date: 01/10/2016      PT End of Session - 01/10/16 1714    Visit Number 4   Number of Visits 9   Date for PT Re-Evaluation 02/04/16   PT Start Time 9774   PT Stop Time 1522   PT Time Calculation (min) 39 min   Activity Tolerance Patient tolerated treatment well   Behavior During Therapy [facility] for tasks assessed/performed      Past Medical History:  Diagnosis Date  . Allergic rhinitis   . Anxiety   . Arthritis    in back  . Breast cancer ([place]) 03/12/14   right/invasive ductal carcinoma, DCIS  . Depression   . Difficulty sleeping    takes Lorazepam as needed  . HTN (hypertension)   . Hypercholesteremia   . IBS (irritable bowel syndrome)   . Radiation 11/23/14-01/07/15   Right breast  . Wears glasses     Past Surgical History:  Procedure Laterality Date  . BREAST SURGERY  11/15   lumpectomy RT breast  . COLONOSCOPY    . CYST EXCISION     right axilla  . DILATION AND CURETTAGE OF UTERUS    . PORT-A-CATH REMOVAL N/A 03/12/2015   Procedure: REMOVAL PORT-A-CATH;  Surgeon: [name], MD;  [facility];  Service: General;  Laterality: N/A;  . PORTACATH PLACEMENT Right 06/01/2014   Procedure: ULTRASOUND GUIDED INSERTION PORT-A-CATH;  Surgeon: [name], MD;  Location: [place];  Service: General;  Laterality: Right;  . RADIOACTIVE SEED GUIDED MASTECTOMY WITH AXILLARY SENTINEL LYMPH NODE BIOPSY Right 04/20/2014   Procedure: RIGHT BREAST RADIOACTIVE SEED GUIDED LUMPECTOMY WITH RIGHT AXILLARY SENTINEL LYMPH NODE BIOPSY;  Surgeon: [name], MD;  Location: [place];  Service: General;  Laterality: Right;  . TONSILLECTOMY       There were no vitals filed for this visit.      Subjective Assessment - 01/10/16 1447    Subjective Still about the same.  Wears the splint when she walks with the children.  Forgot last week when they went on  a field trip, and it hurt a lot then.  Has been doing the exercises.  Dong the ROM more seems to help.     Currently in Pain? No/denies   Aggravating Factors  overuse of the hand at school or home   Pain Relieving Factors wrapping a bottle of water on the left hand feels good                         [facility] Adult PT Treatment/Exercise - 01/10/16 0001      Cryotherapy   Number Minutes Cryotherapy 4 Minutes   Cryotherapy Location Hand  base of left thumb   Type of Cryotherapy Ice massage     Manual Therapy   Manual Therapy Myofascial release;Manual Lymphatic Drainage (MLD)   Manual therapy comments     Soft tissue mobilization cross friction massage at just proximal to 1st MCP joint ,   Myofascial Release left UE myofascial pulling   Passive ROM to thumb for flexion, extension, abduction, and adduction; also opposition to fifth digit; also to abduct metacarpals  PT Education - 01/10/16 1718    Education provided Yes   Education Details about ice massage, to be done for just a few minutes and to be stopped at the point of numbness in the skin   Person(s) Educated Patient   Methods Explanation;Demonstration   Comprehension Verbalized understanding                [place] - 01/10/16 1451      CC Long Term Goal  #1   Title Patient will report at least 75% decrease in left base of thumb pain.   Baseline 50% as of 01/10/16   Status Partially Met     CC Long Term Goal  #2   Title Patient will be independent in home exercise program and pain management for left hand and wrist.   Status Partially Met            Plan - 01/10/16 1715    Clinical Impression Statement When asked, patient says her left  thumb still hurts, but then when asked to rate improvement from 0-100%, she reports 50% improvement, which is good progress toward her goal.   Rehab Potential Good   Clinical Impairments Affecting Rehab Potential none   PT Frequency 2x / week   PT Duration 4 weeks   PT Treatment/Interventions ADLs/Self Care Home Management;Electrical Stimulation;Iontophoresis 28m/ml Dexamethasone;Moist Heat;DME Instruction;Therapeutic exercise;Patient/family education;Manual techniques;Passive range of motion;Taping;Cryotherapy   PT Next Visit Plan Continue cross friction massage, ROM, exercise, and ice massage for left thumb pain.   Consulted and Agree with Plan of Care Patient      Patient will benefit from skilled therapeutic intervention in order to improve the following deficits and impairments:  Pain, Decreased range of motion, Decreased strength, Impaired UE functional use  Visit Diagnosis: Pain in joints of left hand  Other symptoms and signs involving the musculoskeletal system     Problem List Patient Active Problem List   Diagnosis Date Noted  . Hypotension 05/18/2015  . Fatigue 05/18/2015  . Nausea with vomiting 07/23/2014  . Dehydration 07/23/2014  . GERD (gastroesophageal reflux disease) 07/23/2014  . [place]rop[place] ([place] 07/23/2014  . Breast cancer of upper-outer quadrant of right female breast ([place] 03/20/2014  . Palpitations 03/26/2013  . Hypertension 07/02/2011  . Hyperlipidemia 07/02/2011  . CONSTIPATION 01/22/2009  . FLATULENCE-GAS-BLOATING 01/22/2009    [place],DONNA 01/10/2016, 5:20 PM  [place] [address]: [phone]  Fax:  [phone] Name: Mckenzie POTIERMRN: [id number] of Birth: 908-Apr-1957  DSerafina Royals PT 01/10/16 5:20 PM

## 2016-01-12 ENCOUNTER — Ambulatory Visit: Payer: 59 | Admitting: Physical Therapy

## 2016-01-12 DIAGNOSIS — M25542 Pain in joints of left hand: Secondary | ICD-10-CM

## 2016-01-12 DIAGNOSIS — M25532 Pain in left wrist: Secondary | ICD-10-CM

## 2016-01-12 NOTE — Therapy (Signed)
[place], [place], [address] Phone: [phone]   Fax:  [phone]  Physical Therapy Treatment  Patient Details  Name: Mckenzie Sanford MRN: [id number] Date of Birth: 06/03/1955 Referring Provider: Dr. Vickki Hearing  Encounter Date: 01/12/2016      PT End of Session - 01/12/16 1705    Visit Number 5   Number of Visits 9   Date for PT Re-Evaluation 02/04/16   PT Start Time 6195   PT Stop Time 1432   PT Time Calculation (min) 43 min   Activity Tolerance Patient tolerated treatment well   Behavior During Therapy [facility] for tasks assessed/performed      Past Medical History:  Diagnosis Date  . Allergic rhinitis   . Anxiety   . Arthritis    in back  . 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 Depression   . Difficulty sleeping    takes Lorazepam as needed  . HTN (hypertension)   . Hypercholesteremia   . IBS (irritable bowel syndrome)   . Radiation 11/23/14-01/07/15   Right breast  . Wears glasses     Past Surgical History:  Procedure Laterality Date  . BREAST SURGERY  11/15   lumpectomy RT breast  . COLONOSCOPY    . CYST EXCISION     right axilla  . DILATION AND CURETTAGE OF UTERUS    . PORT-A-CATH REMOVAL N/A 03/12/2015   Procedure: REMOVAL PORT-A-CATH;  Surgeon: [name], MD;  [facility];  Service: General;  Laterality: N/A;  . PORTACATH PLACEMENT Right 06/01/2014   Procedure: ULTRASOUND GUIDED INSERTION PORT-A-CATH;  Surgeon: [name], MD;  Location: [place];  Service: General;  Laterality: Right;  . RADIOACTIVE SEED GUIDED MASTECTOMY WITH AXILLARY SENTINEL LYMPH NODE BIOPSY Right 04/20/2014   Procedure: RIGHT BREAST RADIOACTIVE SEED GUIDED LUMPECTOMY WITH RIGHT AXILLARY SENTINEL LYMPH NODE BIOPSY;  Surgeon: [name], MD;  Location: [place];  Service: General;  Laterality: Right;  . TONSILLECTOMY       There were no vitals filed for this visit.      Subjective Assessment - 01/12/16 1353    Subjective Feels about 60 percent better. Feels like the ice massage was really helpful and has been using ice on her hand the last couple of days.   Pertinent History Right breast cancer with lumpectomy 04/20/14 with 3 lymph nodes removed.  Completed chemotherapy and radiation 01/07/15.  Went back to work 11/02/14.  HTN controlled with meds but still variable.  Hyperlipidemia.  Was being seen here for back pain earlier this year, but developed left wrist pain, which she is now referred for.  She had an injection to the left wrist for pain there about two months ago and had a brace at that time; later developed base of thumb pain and now has a smaller velcro thumb brace.     Patient Stated Goals Wants the thumb and wrist to feel better, and her back too.   Currently in Pain? Yes   Pain Score 4    Pain Location Hand   Pain Orientation Left                         [facility] Adult PT Treatment/Exercise - 01/12/16 0001      Wrist Exercises   Wrist Radial Deviation Strengthening;10 reps;Bar weights/barbell;Other (comment)  eccentric   Bar Weights/Barbell (Radial Deviation) 1 lb  Cryotherapy   Number Minutes Cryotherapy 6 Minutes   Cryotherapy Location Hand  base of left thumb   Type of Cryotherapy Ice massage     Manual Therapy   Manual Therapy Soft tissue mobilization;Passive ROM;Other (comment)   Soft tissue mobilization cross friction massage at just proximal to 1st MCP joint and to thenar eminence   Passive ROM to thumb for flexion, extension, abduction, and adduction; also opposition to fifth digit; to wrist into ulnar deviation   Other Manual Therapy eccentric left thumb extension and abduction with manual resistance 10 reps each                        [facility] Goals - 01/12/16 1711      CC Long Term Goal  #1   Title Patient will report at least  75% decrease in left base of thumb pain.   Baseline 50% as of 01/10/16; 60% as of 01/12/16   Time 4   Period Weeks   Status Partially Met     CC Long Term Goal  #2   Title Patient will be independent in home exercise program and pain management for left hand and wrist.   Time 4   Period Weeks   Status Partially Met            Plan - 01/12/16 1706    Clinical Impression Statement Patient reports pain in left thumb is improving, especially after beginning ice massage last session. Therapist added eccentric left thumb extension and abduction and wrist radial deviation strengthening this visit.    Rehab Potential Good   Clinical Impairments Affecting Rehab Potential none   PT Frequency 2x / week   PT Duration 4 weeks   PT Treatment/Interventions ADLs/Self Care Home Management;Electrical Stimulation;Iontophoresis '4mg'$ /ml Dexamethasone;Moist Heat;DME Instruction;Therapeutic exercise;Patient/family education;Manual techniques;Passive range of motion;Taping;Cryotherapy   PT Next Visit Plan Continue cross friction massage, ROM, thumb and wrist strengthening, and ice massage for left thumb pain.   Consulted and Agree with Plan of Care Patient      Patient will benefit from skilled therapeutic intervention in order to improve the following deficits and impairments:  Pain, Decreased range of motion, Decreased strength, Impaired UE functional use  Visit Diagnosis: Pain in joints of left hand  Pain in left wrist     Problem List Patient Active Problem List   Diagnosis Date Noted  . Hypotension 05/18/2015  . Fatigue 05/18/2015  . Nausea with vomiting 07/23/2014  . Dehydration 07/23/2014  . GERD (gastroesophageal reflux disease) 07/23/2014  . Neutropenia ([place]) 07/23/2014  . Breast cancer of upper-outer quadrant of right female breast ([place]) 03/20/2014  . Palpitations 03/26/2013  . Hypertension 07/02/2011  . Hyperlipidemia 07/02/2011  . CONSTIPATION 01/22/2009  .  FLATULENCE-GAS-BLOATING 01/22/2009    Mellody Life 01/12/2016, 5:13 PM  [place], [place], [address] Phone: [phone]   Fax:  [phone]  Name: CHARLSEY MORAGNE MRN: [id number] Date of Birth: [birth year]   Saverio Danker, SPT

## 2016-01-12 NOTE — Therapy (Signed)
[place], [place], [address] Phone: [phone]   Fax:  [phone]  Physical Therapy Treatment  Patient Details  Name: Mckenzie Sanford MRN: [id number] Date of Birth: Oct 11, 1955 Referring Provider: Dr. Vickki Hearing  Encounter Date: 01/12/2016      PT End of Session - 01/12/16 1705    Visit Number 5   Number of Visits 9   Date for PT Re-Evaluation 02/04/16   PT Start Time 6195   PT Stop Time 1432   PT Time Calculation (min) 43 min   Activity Tolerance Patient tolerated treatment well   Behavior During Therapy [facility] for tasks assessed/performed      Past Medical History:  Diagnosis Date  . Allergic rhinitis   . Anxiety   . Arthritis    in back  . 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 Depression   . Difficulty sleeping    takes Lorazepam as needed  . HTN (hypertension)   . Hypercholesteremia   . IBS (irritable bowel syndrome)   . Radiation 11/23/14-01/07/15   Right breast  . Wears glasses     Past Surgical History:  Procedure Laterality Date  . BREAST SURGERY  11/15   lumpectomy RT breast  . COLONOSCOPY    . CYST EXCISION     right axilla  . DILATION AND CURETTAGE OF UTERUS    . PORT-A-CATH REMOVAL N/A 03/12/2015   Procedure: REMOVAL PORT-A-CATH;  Surgeon: [name], MD;  [facility];  Service: General;  Laterality: N/A;  . PORTACATH PLACEMENT Right 06/01/2014   Procedure: ULTRASOUND GUIDED INSERTION PORT-A-CATH;  Surgeon: [name], MD;  Location: [place];  Service: General;  Laterality: Right;  . RADIOACTIVE SEED GUIDED MASTECTOMY WITH AXILLARY SENTINEL LYMPH NODE BIOPSY Right 04/20/2014   Procedure: RIGHT BREAST RADIOACTIVE SEED GUIDED LUMPECTOMY WITH RIGHT AXILLARY SENTINEL LYMPH NODE BIOPSY;  Surgeon: [name], MD;  Location: [place];  Service: General;  Laterality: Right;  . TONSILLECTOMY       There were no vitals filed for this visit.      Subjective Assessment - 01/12/16 1353    Subjective Feels about 60 percent better. Feels like the ice massage was really helpful and has been using ice on her hand the last couple of days.   Pertinent History Right breast cancer with lumpectomy 04/20/14 with 3 lymph nodes removed.  Completed chemotherapy and radiation 01/07/15.  Went back to work 11/02/14.  HTN controlled with meds but still variable.  Hyperlipidemia.  Was being seen here for back pain earlier this year, but developed left wrist pain, which she is now referred for.  She had an injection to the left wrist for pain there about two months ago and had a brace at that time; later developed base of thumb pain and now has a smaller velcro thumb brace.     Patient Stated Goals Wants the thumb and wrist to feel better, and her back too.   Currently in Pain? Yes   Pain Score 4    Pain Location Hand   Pain Orientation Left                         [facility] Adult PT Treatment/Exercise - 01/12/16 0001      Wrist Exercises   Wrist Radial Deviation Strengthening;10 reps;Bar weights/barbell;Other (comment)  eccentric   Bar Weights/Barbell (Radial Deviation) 1 lb  Cryotherapy   Number Minutes Cryotherapy 6 Minutes   Cryotherapy Location Hand  base of left thumb and wrist   Type of Cryotherapy Ice massage     Manual Therapy   Manual Therapy Soft tissue mobilization;Passive ROM;Other (comment)   Soft tissue mobilization cross friction massage at just proximal to 1st MCP joint and to thenar eminence   Passive ROM to thumb for flexion, extension, abduction, and adduction; also opposition to fifth digit; to wrist into ulnar deviation   Other Manual Therapy eccentric left thumb extension and abduction with manual resistance 10 reps each                        [facility] Goals - 01/12/16 1711      CC Long Term Goal  #1   Title Patient will report  at least 75% decrease in left base of thumb pain.   Baseline 50% as of 01/10/16; 60% as of 01/12/16   Time 4   Period Weeks   Status Partially Met     CC Long Term Goal  #2   Title Patient will be independent in home exercise program and pain management for left hand and wrist.   Time 4   Period Weeks   Status Partially Met            Plan - 01/12/16 1706    Clinical Impression Statement Patient reports pain in left thumb is improving, especially after beginning ice massage last session. Therapist added eccentric left thumb extension and abduction and wrist radial deviation strengthening this visit.    Rehab Potential Good   Clinical Impairments Affecting Rehab Potential none   PT Frequency 2x / week   PT Duration 4 weeks   PT Treatment/Interventions ADLs/Self Care Home Management;Electrical Stimulation;Iontophoresis 45m/ml Dexamethasone;Moist Heat;DME Instruction;Therapeutic exercise;Patient/family education;Manual techniques;Passive range of motion;Taping;Cryotherapy   PT Next Visit Plan Continue cross friction massage, ROM, thumb and wrist strengthening, and ice massage for left thumb pain.   Consulted and Agree with Plan of Care Patient      Patient will benefit from skilled therapeutic intervention in order to improve the following deficits and impairments:  Pain, Decreased range of motion, Decreased strength, Impaired UE functional use  Visit Diagnosis: Pain in joints of left hand  Pain in left wrist     Problem List Patient Active Problem List   Diagnosis Date Noted  . Hypotension 05/18/2015  . Fatigue 05/18/2015  . Nausea with vomiting 07/23/2014  . Dehydration 07/23/2014  . GERD (gastroesophageal reflux disease) 07/23/2014  . Neutropenia ([place] 07/23/2014  . Breast cancer of upper-outer quadrant of right female breast ([place] 03/20/2014  . Palpitations 03/26/2013  . Hypertension 07/02/2011  . Hyperlipidemia 07/02/2011  . CONSTIPATION 01/22/2009  .  FLATULENCE-GAS-BLOATING 01/22/2009    Kevin Mario 01/12/2016, 5:31 PM  [place] [address]Phone: [phone]  Fax:  [phone] Name: HPRIYAL MUSQUIZMRN: [id number] of Birth: [birth year]  Read, reviewed, edited and agree with student's findings and recommendations.   This entire session was guided, instructed, and directly supervised by DSerafina Royals [place] PT 01/12/16 5:32 PM

## 2016-01-17 ENCOUNTER — Ambulatory Visit: Payer: 59 | Admitting: Physical Therapy

## 2016-01-19 ENCOUNTER — Ambulatory Visit: Payer: 59 | Admitting: Physical Therapy

## 2016-01-19 DIAGNOSIS — M25532 Pain in left wrist: Secondary | ICD-10-CM

## 2016-01-19 DIAGNOSIS — M25642 Stiffness of left hand, not elsewhere classified: Secondary | ICD-10-CM

## 2016-01-19 DIAGNOSIS — M25542 Pain in joints of left hand: Secondary | ICD-10-CM

## 2016-01-19 NOTE — Progress Notes (Signed)
This encounter was created in error - please disregard.

## 2016-01-19 NOTE — Therapy (Signed)
[place], [place], [address] Phone: [phone]   Fax:  [phone]  Physical Therapy Treatment  Patient Details  Name: Mckenzie Sanford MRN: [id number] Date of Birth: [birth year] Referring Provider: Dr. Vickki Hearing  Encounter Date: 01/19/2016      PT End of Session - 01/19/16 1529    Visit Number 6   Number of Visits 9   Date for PT Re-Evaluation 02/04/16   PT Start Time 4599   PT Stop Time 1432   PT Time Calculation (min) 37 min   Activity Tolerance Patient tolerated treatment well   Behavior During Therapy [facility] for tasks assessed/performed      Past Medical History:  Diagnosis Date  . Allergic rhinitis   . Anxiety   . Arthritis    in back  . Breast cancer ([place]) 03/12/14   right/invasive ductal carcinoma, DCIS  . Depression   . Difficulty sleeping    takes Lorazepam as needed  . HTN (hypertension)   . Hypercholesteremia   . IBS (irritable bowel syndrome)   . Radiation 11/23/14-01/07/15   Right breast  . Wears glasses     Past Surgical History:  Procedure Laterality Date  . BREAST SURGERY  11/15   lumpectomy RT breast  . COLONOSCOPY    . CYST EXCISION     right axilla  . DILATION AND CURETTAGE OF UTERUS    . PORT-A-CATH REMOVAL N/A 03/12/2015   Procedure: REMOVAL PORT-A-CATH;  Surgeon: [name], MD;  [facility];  Service: General;  Laterality: N/A;  . PORTACATH PLACEMENT Right 06/01/2014   Procedure: ULTRASOUND GUIDED INSERTION PORT-A-CATH;  Surgeon: [name], MD;  Location: [place];  Service: General;  Laterality: Right;  . RADIOACTIVE SEED GUIDED MASTECTOMY WITH AXILLARY SENTINEL LYMPH NODE BIOPSY Right 04/20/2014   Procedure: RIGHT BREAST RADIOACTIVE SEED GUIDED LUMPECTOMY WITH RIGHT AXILLARY SENTINEL LYMPH NODE BIOPSY;  Surgeon: [name], MD;  Location: [place];  Service: General;  Laterality: Right;  . TONSILLECTOMY       There were no vitals filed for this visit.      Subjective Assessment - 01/19/16 1358    Subjective Saturday, thumb started swelling and hurting, but she doesn't know why. She could barely move it on Monday. She states thumb doesn't look as swollen and feels better today.   Pertinent History Right breast cancer with lumpectomy 04/20/14 with 3 lymph nodes removed.  Completed chemotherapy and radiation 01/07/15.  Went back to work 11/02/14.  HTN controlled with meds but still variable.  Hyperlipidemia.  Was being seen here for back pain earlier this year, but developed left wrist pain, which she is now referred for.  She had an injection to the left wrist for pain there about two months ago and had a brace at that time; later developed base of thumb pain and now has a smaller velcro thumb brace.     Patient Stated Goals Wants the thumb and wrist to feel better, and her back too.   Currently in Pain? Yes   Pain Score 4    Pain Location Wrist   Pain Orientation Left   Pain Descriptors / Indicators Tender   Pain Type Chronic pain   Pain Onset More than a month ago   Aggravating Factors  overusing the hand   Pain Relieving Factors doing an ice massage  [facility] Adult PT Treatment/Exercise - 01/19/16 0001      Elbow Exercises   Wrist Flexion Strengthening;Left;10 reps;Bar weights/barbell;Other (comment)  eccentric   Bar Weights/Barbell (Wrist Flexion) 2 lbs   Wrist Extension Strengthening;Left;10 reps;Bar weights/barbell   Bar Weights/Barbell (Wrist Extension) 2 lbs     Wrist Exercises   Wrist Radial Deviation Strengthening;10 reps;Bar weights/barbell;Other (comment)   Bar Weights/Barbell (Radial Deviation) 2 lbs   Wrist Ulnar Deviation Strengthening;Left;10 reps;Theraband   Theraband Level (Ulnar Deviation) Level 2 (Red)     Cryotherapy   Number Minutes Cryotherapy 6 Minutes   Cryotherapy Location Hand  base of thumb and wrist   Type of  Cryotherapy Ice massage     Manual Therapy   Manual Therapy Soft tissue mobilization;Passive ROM;Other (comment)   Soft tissue mobilization cross friction massage at just proximal to 1st MCP joint and to thenar eminence   Passive ROM to thumb for flexion, extension, abduction, and adduction; also opposition to fifth digit; to wrist into ulnar deviation   Other Manual Therapy eccentric left thumb extension and abduction with manual resistance 10 reps each                        [facility] Goals - 01/12/16 1711      CC Long Term Goal  #1   Title Patient will report at least 75% decrease in left base of thumb pain.   Baseline 50% as of 01/10/16; 60% as of 01/12/16   Time 4   Period Weeks   Status Partially Met     CC Long Term Goal  #2   Title Patient will be independent in home exercise program and pain management for left hand and wrist.   Time 4   Period Weeks   Status Partially Met            Plan - 01/19/16 1531    Clinical Impression Statement Patient reports a flare up of her pain over the weekend that has since gotten better. Patient was able to perform wrist strengthening exercises into flexion, extension, radial deviation, and ulnar deviation today with a 2 lb weight; attempted a 3 lb weight but it was too painful. Therapist performed ice massage again, which seemed to alleviate her pain.   Rehab Potential Good   Clinical Impairments Affecting Rehab Potential none   PT Frequency 2x / week   PT Duration 4 weeks   PT Treatment/Interventions ADLs/Self Care Home Management;Electrical Stimulation;Iontophoresis 27m/ml Dexamethasone;Moist Heat;DME Instruction;Therapeutic exercise;Patient/family education;Manual techniques;Passive range of motion;Taping;Cryotherapy   PT Next Visit Plan continue cross friction massage and ROM, progress wrist and thumb strengthening as able, continue ice massage   Consulted and Agree with Plan of Care Patient      Patient  will benefit from skilled therapeutic intervention in order to improve the following deficits and impairments:  Pain, Decreased range of motion, Decreased strength, Impaired UE functional use  Visit Diagnosis: Pain in joints of left hand  Pain in left wrist  Stiffness of left hand, not elsewhere classified     Problem List Patient Active Problem List   Diagnosis Date Noted  . Hypotension 05/18/2015  . Fatigue 05/18/2015  . Nausea with vomiting 07/23/2014  . Dehydration 07/23/2014  . GERD (gastroesophageal reflux disease) 07/23/2014  . Neutropenia ([place] 07/23/2014  . Breast cancer of upper-outer quadrant of right female breast ([place] 03/20/2014  . Palpitations 03/26/2013  . Hypertension 07/02/2011  . Hyperlipidemia 07/02/2011  . CONSTIPATION 01/22/2009  .  FLATULENCE-GAS-BLOATING 01/22/2009    Mellody Life, SPT 01/19/2016, 3:37 PM  [place], [place], [address] Phone: [phone]   Fax:  [phone]  Name: Mckenzie Sanford MRN: [id number] Date of Birth: 03-24-1956   Read, reviewed, edited and agree with student's findings and recommendations.   This entire session was guided, instructed, and directly supervised by Serafina Royals, [place], PT 01/19/16 9:29 PM

## 2016-01-24 ENCOUNTER — Ambulatory Visit: Payer: 59 | Admitting: Physical Therapy

## 2016-01-26 ENCOUNTER — Ambulatory Visit: Payer: 59

## 2016-02-02 ENCOUNTER — Ambulatory Visit: Payer: 59 | Admitting: Physical Therapy

## 2016-02-09 ENCOUNTER — Ambulatory Visit: Payer: 59 | Attending: Sports Medicine

## 2016-02-09 DIAGNOSIS — M25532 Pain in left wrist: Secondary | ICD-10-CM | POA: Insufficient documentation

## 2016-02-09 DIAGNOSIS — M25542 Pain in joints of left hand: Secondary | ICD-10-CM | POA: Insufficient documentation

## 2016-02-09 DIAGNOSIS — M25642 Stiffness of left hand, not elsewhere classified: Secondary | ICD-10-CM | POA: Insufficient documentation

## 2016-02-14 ENCOUNTER — Ambulatory Visit: Payer: 59 | Admitting: Physical Therapy

## 2016-02-16 ENCOUNTER — Ambulatory Visit (HOSPITAL_BASED_OUTPATIENT_CLINIC_OR_DEPARTMENT_OTHER): Payer: 59 | Admitting: Hematology and Oncology

## 2016-02-16 ENCOUNTER — Encounter: Payer: Self-pay | Admitting: Hematology and Oncology

## 2016-02-16 ENCOUNTER — Encounter: Payer: Self-pay | Admitting: *Deleted

## 2016-02-16 DIAGNOSIS — G3184 Mild cognitive impairment, so stated: Secondary | ICD-10-CM | POA: Diagnosis not present

## 2016-02-16 DIAGNOSIS — M8580 Other specified disorders of bone density and structure, unspecified site: Secondary | ICD-10-CM | POA: Diagnosis not present

## 2016-02-16 DIAGNOSIS — C50411 Malignant neoplasm of upper-outer quadrant of right female breast: Secondary | ICD-10-CM | POA: Diagnosis not present

## 2016-02-16 DIAGNOSIS — C773 Secondary and unspecified malignant neoplasm of axilla and upper limb lymph nodes: Secondary | ICD-10-CM

## 2016-02-16 MED ORDER — EXEMESTANE 25 MG PO TABS: 25.0000 mg | ORAL_TABLET | Freq: Every day | ORAL | 3 refills | Status: DC

## 2016-02-16 NOTE — Progress Notes (Signed)
Patient Care Team: [name], MD as PCP - General (Internal Medicine) [name], MD as Consulting Physician (General Surgery) [name], MD as Consulting Physician (Hematology and Oncology) [name], MD as Consulting Physician (Radiation Oncology) [name], MD as Consulting Physician (Obstetrics and Gynecology) [name], NP as Nurse Practitioner (Hematology and Oncology)  DIAGNOSIS: Breast cancer of upper-outer quadrant of right female breast [facility])   Staging form: Breast, AJCC 7th Edition   - Clinical: Stage IA (T1b, N0, cM0) - Signed by Rulon Eisenmenger, MD on 03/20/2014   - Pathologic: Stage IIB (T2, N1, cM0) - Unsigned  SUMMARY OF ONCOLOGIC HISTORY:   Breast cancer of upper-outer quadrant of right female breast ([place])   03/10/2014 Mammogram    Right breast: mass      03/10/2014 Breast US    Right breast: 1 cm irregular mass, UOQ, anterior depth, hypoechoic with posterior acoustic shadowing.  LN with focal cortex thickening in right axilla.      03/12/2014 Initial Biopsy    Right breast core needle bx: Invasive ductal carcinoma with DCIS, right axillary lymph node biopsy negative, ER+ (100%), PR+ (84%), Ki-67 16%, HER-2 negative (ratio 1.22)      03/20/2014 Breast MRI    Right breast UOQ: 2.6 x 1.9 x 1.7 cm invasive ductal carcinoma with extension and involvement of the adjacent skin and nipple area complex, no abnormal lymph nodes      03/21/2014 Clinical Stage    Stage IA (T1b, N0, cM0)      04/20/2014 Definitive Surgery    Right breast lumpectomy/SLNB([name]): IDC grade 2; 2.5 cm with intermediate to high-grade DCIS margins negative: 1/3 positive sentinel lymph node with extracapsular extension, ER 100%, PR 84%, HER-2 negative ratio 1.22, Ki-67 16%      04/20/2014 Pathologic Stage     Stage IIB: pT2, pN1a, cM0       06/04/2014 - 10/22/2014 Adjuvant Chemotherapy    Dose dense doxorubicin and cyclophosphamide x 4 cycles  followed by paclitaxel x 1 then nab-paclitaxel x 11 (total 12 cycles)      11/23/2014 - 01/07/2015 Radiation Therapy    Adjuvant RT [name]): Right breast / 45 Gray over 25 fractions; Right breast boost / 16 Gray over 8 fractions      02/17/2015 -  Anti-estrogen oral therapy    Anastrozole 1 mg daily. Switched to letrozole and then switched to exemestane 02/16/2016      04/01/2015 Survivorship    Survivorship care plan completed and given to patient.       CHIEF COMPLIANT: Follow-up on letrozole, continued problems with fatigue  INTERVAL HISTORY: Mckenzie Sanford is a 60 year old with above-mentioned history of right breast cancer treated with lumpectomy followed by adjuvant chemotherapy and radiation and is currently on adjuvant antiestrogen therapy. She was initially on anastrozole which she could not tolerate and we switched her to letrozole. She is still having similar problems with severe fatigue and musculoskeletal aches and pains. She is only been able to work 6 hours because of the symptoms related to antiestrogen therapy. She is interested in switching treatment to exemestane. She is also having problems with cognitive dysfunction.  REVIEW OF SYSTEMS:   Constitutional: Denies fevers, chills or abnormal weight loss Eyes: Denies blurriness of vision Ears, nose, mouth, throat, and face: Denies mucositis or sore throat Respiratory: Denies cough, dyspnea or wheezes Cardiovascular: Denies palpitation, chest discomfort Gastrointestinal:  Denies nausea, heartburn or change in bowel habits Skin: Denies abnormal skin rashes Lymphatics:  Denies new lymphadenopathy or easy bruising Neurological:Denies numbness, tingling or new weaknesses Behavioral/Psych: Mood is stable, no new changes  Extremities: No lower extremity edema Breast:  denies any pain or lumps or nodules in either breasts All other systems were reviewed with the patient and are negative.  I have reviewed the past medical  history, past surgical history, social history and family history with the patient and they are unchanged from previous note.  ALLERGIES:  is allergic to shellfish allergy; ivp dye [iodinated diagnostic agents]; and penicillins.  MEDICATIONS:  Current Outpatient Prescriptions  Medication Sig Dispense Refill  . amLODipine (NORVASC) 10 MG tablet Take 5 mg by mouth at bedtime.     Marland Kitchen atorvastatin (LIPITOR) 20 MG tablet Take 20 mg by mouth daily.    . Calcium Carb-Cholecalciferol (LIQUID CALCIUM WITH D3) [phone] MG-UNIT CAPS Take 600 mg by mouth. Calcium 600 mg with 400 iu of D3    . diphenhydrAMINE (BENADRYL) 25 mg capsule Take 25 mg by mouth every 6 (six) hours as needed.    Marland Kitchen exemestane (AROMASIN) 25 MG tablet Take 1 tablet (25 mg total) by mouth daily after breakfast. 90 tablet 3  . irbesartan (AVAPRO) 150 MG tablet Take 150 mg by mouth 2 (two) times daily.     Marland Kitchen LINZESS 290 MCG CAPS capsule Take 290 mcg by mouth daily as needed.  11  . metaxalone (SKELAXIN) 800 MG tablet TAKE 1/2 TO 1 TABLET EVERY 8 HOURS AS NEEDED FOR MUSCLE RELAXATION  2  . metoprolol succinate (TOPROL-XL) 50 MG 24 hr tablet Take 50 mg by mouth every morning. Take with or immediately following a meal.    . Polyethyl Glycol-Propyl Glycol (SYSTANE OP) Apply 1 drop to eye daily as needed (Dry eyes).    . sertraline (ZOLOFT) 25 MG tablet Take 50 mg by mouth daily.   11  . traMADol (ULTRAM) 50 MG tablet Take 1 tablet (50 mg total) by mouth every 12 (twelve) hours as needed. (Patient not taking: Reported on 12/28/2015) 60 tablet 0   No current facility-administered medications for this visit.     PHYSICAL EXAMINATION: ECOG PERFORMANCE STATUS: 1 - Symptomatic but completely ambulatory  Vitals:   02/16/16 1026  BP: 138/75  Pulse: 67  Resp: 18  Temp: 98.2 F (36.8 C)   Filed Weights   02/16/16 1026  Weight: 129 lb (58.5 kg)    GENERAL:alert, no distress and comfortable SKIN: skin color, texture, turgor are normal, no  rashes or significant lesions EYES: normal, Conjunctiva are pink and non-injected, sclera clear OROPHARYNX:no exudate, no erythema and lips, buccal mucosa, and tongue normal  NECK: supple, thyroid normal size, non-tender, without nodularity LYMPH:  no palpable lymphadenopathy in the cervical, axillary or inguinal LUNGS: clear to auscultation and percussion with normal breathing effort HEART: regular rate & rhythm and no murmurs and no lower extremity edema ABDOMEN:abdomen soft, non-tender and normal bowel sounds MUSCULOSKELETAL:no cyanosis of digits and no clubbing  NEURO: alert & oriented x 3 with fluent speech, no focal motor/sensory deficits EXTREMITIES: No lower extremity edema  LABORATORY DATA:  I have reviewed the data as listed   Chemistry      Component Value Date/Time   NA 142 02/17/2015 1453   K 3.6 02/17/2015 1453   CL 105 09/10/2014 0909   CO2 29 02/17/2015 1453   BUN 15.7 02/17/2015 1453   CREATININE 0.7 02/17/2015 1453      Component Value Date/Time   CALCIUM 10.0 02/17/2015 1453   ALKPHOS  151 (H) 02/17/2015 1453   AST 28 02/17/2015 1453   ALT 38 02/17/2015 1453   BILITOT <0.30 02/17/2015 1453       Lab Results  Component Value Date   WBC 4.6 02/17/2015   HGB 13.5 02/17/2015   HCT 39.3 02/17/2015   MCV 88.7 02/17/2015   PLT 277 02/17/2015   NEUTROABS 2.8 02/17/2015     ASSESSMENT & PLAN:  Breast cancer of upper-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  Current treatment: anastrozole 1 mg daily 5 years started 02/16/2015,  Switched to letrozole, switched to exemestane on 02/16/2016  Letrozole toxicities: 1. Diffuse muscle aches and pains and spasms 2. Severe fatigue usually starts 3 hours after taking the medication 3. Severe hot flashes  Bone density test done at [facility] revealed a T score of -2: Osteopenia: I encouraged her to take calcium and vitamin D and weightbearing exercises.  Cognitive dysfunction: Patient agreed to enroll in clinical trial for cognitive dysfunction [place] [id number] randomizing patients with moderate to severe cognitive dysfunction between donepezil and placebo  Return to clinic in 3 months for follow-up   No orders of the defined types were placed in this encounter.  The patient has a good understanding of the overall plan. she agrees with it. she will call with any problems that may develop before the next visit here.   Rulon Eisenmenger, MD 02/16/16

## 2016-02-16 NOTE — Assessment & Plan Note (Signed)
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  Current treatment: anastrozole 1 mg daily 5 years started 02/16/2015  Anastrozole toxicities: 1. Diffuse muscle aches and pains and spasms: resolved 2. Severe fatigue usually starts 3 hours after taking the medication 3. Severe hot flashes  I will order a bone density test for evaluation of osteoporosis. This order will be sent to [facility].  If the symptoms do not get better over time, we might consider switching her from anastrozole to letrozole or exemestane.  Return to clinic in 6 months for follow-up 

## 2016-02-21 ENCOUNTER — Ambulatory Visit: Payer: 59 | Admitting: Physical Therapy

## 2016-02-21 DIAGNOSIS — M25542 Pain in joints of left hand: Secondary | ICD-10-CM | POA: Diagnosis present

## 2016-02-21 DIAGNOSIS — M25642 Stiffness of left hand, not elsewhere classified: Secondary | ICD-10-CM

## 2016-02-21 DIAGNOSIS — M25532 Pain in left wrist: Secondary | ICD-10-CM | POA: Diagnosis present

## 2016-02-22 NOTE — Therapy (Signed)
[place], [place], [address] Phone: [phone]   Fax:  [phone]  Physical Therapy Treatment  Patient Details  Name: Mckenzie Sanford MRN: [id number] Date of Birth: 02-08-1956 Referring Provider: Dr. Vickki Hearing  Encounter Date: 02/21/2016      PT End of Session - 02/22/16 0758    Visit Number 7   Number of Visits 15   Date for PT Re-Evaluation 03/28/16   PT Start Time [id number]   PT Stop Time 1513   PT Time Calculation (min) 39 min   Activity Tolerance Patient tolerated treatment well   Behavior During Therapy [facility] for tasks assessed/performed      Past Medical History:  Diagnosis Date  . Allergic rhinitis   . Anxiety   . Arthritis    in back  . Breast cancer ([place]) 03/12/14   right/invasive ductal carcinoma, DCIS  . Depression   . Difficulty sleeping    takes Lorazepam as needed  . HTN (hypertension)   . Hypercholesteremia   . IBS (irritable bowel syndrome)   . Radiation 11/23/14-01/07/15   Right breast  . Wears glasses     Past Surgical History:  Procedure Laterality Date  . BREAST SURGERY  11/15   lumpectomy RT breast  . COLONOSCOPY    . CYST EXCISION     right axilla  . DILATION AND CURETTAGE OF UTERUS    . PORT-A-CATH REMOVAL N/A 03/12/2015   Procedure: REMOVAL PORT-A-CATH;  Surgeon: [name], MD;  Location: [facility];  Service: General;  Laterality: N/A;  . PORTACATH PLACEMENT Right 06/01/2014   Procedure: ULTRASOUND GUIDED INSERTION PORT-A-CATH;  Surgeon: [name], MD;  Location: [place];  Service: General;  Laterality: Right;  . RADIOACTIVE SEED GUIDED MASTECTOMY WITH AXILLARY SENTINEL LYMPH NODE BIOPSY Right 04/20/2014   Procedure: RIGHT BREAST RADIOACTIVE SEED GUIDED LUMPECTOMY WITH RIGHT AXILLARY SENTINEL LYMPH NODE BIOPSY;  Surgeon: [name], MD;  Location: [place];  Service: General;  Laterality: Right;  . TONSILLECTOMY       There were no vitals filed for this visit.      Subjective Assessment - 02/21/16 1437    Subjective Had shingles along left side three weeks ago; just went back to work.  "I think it made the thumb worse.  It feels swollen."  Thumb pops in the morning when I wake up; putting a bandage around the IP joint helps it.  Continuing to wear the brace.   Currently in Pain? Yes   Pain Score 9    Pain Location Wrist  and at base of thumb   Pain Orientation Left;Other (Comment);Lateral   Aggravating Factors  overuse   Pain Relieving Factors heat helps thumb; ice helps wrist            [facility] PT Assessment - 02/22/16 0001      Observation/Other Assessments   Observations possible mild swelling around joints of left thumb     AROM   Overall AROM  Other (comment)  grossly WFL at fingers; able to oppose thumb to each finger     Strength   Overall Strength Comments active left thumb extension hurts at left wrist, as does ulnar deviation   Left Wrist Flexion 4/5   Left Wrist Extension 4+/5   Left Wrist Radial Deviation --  painful   Left Wrist Ulnar Deviation 5/5   Left Hand Grip (lbs) 23  dynamometer on 3rd setting     Palpation  Palpation comment exquisitely tender at distal left radius                     [facility] Adult PT Treatment/Exercise - 02/22/16 0001      Cryotherapy   Number Minutes Cryotherapy 6 Minutes   Cryotherapy Location Hand  base of thumb and wrist   Type of Cryotherapy Ice massage     Manual Therapy   Soft tissue mobilization cross friction massaage to area around 1st MCP and lateral wrist area of tenderness                        Long Term Clinic Goals - 02/22/16 0802      CC Long Term Goal  #1   Title Patient will report at least 75% decrease in left base of thumb pain.   Status On-going     CC Long Term Goal  #2   Title Patient will be independent in home exercise program and pain management for left hand and wrist.    Status On-going            Plan - 02/22/16 0759    Clinical Impression Statement Patient returns after three weeks away due having developed shingles.  Therapist thought her symptoms might have improved during this time since she was out of work and presumable rested the left hand, but she was very tender today at the wrist (more than previously) and had some thumb tenderness as well.  She will benefit from continued treatment as before with the addition of iontophoresis if approved by MD.   Rehab Potential Good   Clinical Impairments Affecting Rehab Potential none   PT Frequency 2x / week   PT Duration 4 weeks   PT Treatment/Interventions ADLs/Self Care Home Management;Electrical Stimulation;Iontophoresis 4mg /ml Dexamethasone;Moist Heat;DME Instruction;Therapeutic exercise;Patient/family education;Manual techniques;Passive range of motion;Taping;Cryotherapy   PT Next Visit Plan continue cross friction massage and ROM, progress wrist and thumb strengthening as able, continue ice massage   PT Home Exercise Plan continue use of cold   Consulted and Agree with Plan of Care Patient      Patient will benefit from skilled therapeutic intervention in order to improve the following deficits and impairments:  Pain, Decreased range of motion, Decreased strength, Impaired UE functional use  Visit Diagnosis: Pain in joints of left hand - Plan: PT plan of care cert/re-cert  Pain in left wrist - Plan: PT plan of care cert/re-cert  Stiffness of left hand, not elsewhere classified - Plan: PT plan of care cert/re-cert     Problem List Patient Active Problem List   Diagnosis Date Noted  . Hypotension 05/18/2015  . Fatigue 05/18/2015  . Nausea with vomiting 07/23/2014  . Dehydration 07/23/2014  . GERD (gastroesophageal reflux disease) 07/23/2014  . Neutropenia ([place]) 07/23/2014  . Breast cancer of upper-outer quadrant of right female breast ([place]) 03/20/2014  . Palpitations 03/26/2013  .  Hypertension 07/02/2011  . Hyperlipidemia 07/02/2011  . CONSTIPATION 01/22/2009  . FLATULENCE-GAS-BLOATING 01/22/2009    SALISBURY,DONNA 02/22/2016, 8:06 AM  [place], [place], [address] Phone: [phone]   Fax:  [phone]  Name: Mckenzie Sanford MRN: [id number] Date of Birth: [birth year]  Serafina Royals, PT 02/22/16 8:07 AM

## 2016-02-23 ENCOUNTER — Ambulatory Visit: Payer: 59 | Admitting: Physical Therapy

## 2016-02-23 DIAGNOSIS — M25642 Stiffness of left hand, not elsewhere classified: Secondary | ICD-10-CM

## 2016-02-23 DIAGNOSIS — M25532 Pain in left wrist: Secondary | ICD-10-CM

## 2016-02-23 DIAGNOSIS — M25542 Pain in joints of left hand: Secondary | ICD-10-CM | POA: Diagnosis not present

## 2016-02-23 NOTE — Therapy (Signed)
[place], [place], [address] Phone: [phone]   Fax:  [phone]  Physical Therapy Treatment  Patient Details  Name: Mckenzie Sanford MRN: [id number] Date of Birth: Jun 11, 1955 Referring Provider: Dr. Vickki Hearing  Encounter Date: 02/23/2016      PT End of Session - 02/23/16 1436    Visit Number 8   Number of Visits 15   Date for PT Re-Evaluation 03/28/16   PT Start Time [id number]   PT Stop Time 1432   PT Time Calculation (min) 43 min   Activity Tolerance Patient tolerated treatment well   Behavior During Therapy [facility] for tasks assessed/performed      Past Medical History:  Diagnosis Date  . Allergic rhinitis   . Anxiety   . Arthritis    in back  . Breast cancer ([place]) 03/12/14   right/invasive ductal carcinoma, DCIS  . Depression   . Difficulty sleeping    takes Lorazepam as needed  . HTN (hypertension)   . Hypercholesteremia   . IBS (irritable bowel syndrome)   . Radiation 11/23/14-01/07/15   Right breast  . Wears glasses     Past Surgical History:  Procedure Laterality Date  . BREAST SURGERY  11/15   lumpectomy RT breast  . COLONOSCOPY    . CYST EXCISION     right axilla  . DILATION AND CURETTAGE OF UTERUS    . PORT-A-CATH REMOVAL N/A 03/12/2015   Procedure: REMOVAL PORT-A-CATH;  Surgeon: [name], MD;  [facility];  Service: General;  Laterality: N/A;  . PORTACATH PLACEMENT Right 06/01/2014   Procedure: ULTRASOUND GUIDED INSERTION PORT-A-CATH;  Surgeon: [name], MD;  Location: [place];  Service: General;  Laterality: Right;  . RADIOACTIVE SEED GUIDED MASTECTOMY WITH AXILLARY SENTINEL LYMPH NODE BIOPSY Right 04/20/2014   Procedure: RIGHT BREAST RADIOACTIVE SEED GUIDED LUMPECTOMY WITH RIGHT AXILLARY SENTINEL LYMPH NODE BIOPSY;  Surgeon: [name], MD;  Location: [place];  Service: General;  Laterality: Right;  . TONSILLECTOMY       There were no vitals filed for this visit.      Subjective Assessment - 02/23/16 1350    Subjective Pain is still flared up. She is still wearing the brace.   Pertinent History Right breast cancer with lumpectomy 04/20/14 with 3 lymph nodes removed.  Completed chemotherapy and radiation 01/07/15.  Went back to work 11/02/14.  HTN controlled with meds but still variable.  Hyperlipidemia.  Was being seen here for back pain earlier this year, but developed left wrist pain, which she is now referred for.  She had an injection to the left wrist for pain there about two months ago and had a brace at that time; later developed base of thumb pain and now has a smaller velcro thumb brace.     Patient Stated Goals Wants the thumb and wrist to feel better, and her back too.   Currently in Pain? Yes   Pain Score 7    Pain Location Wrist   Pain Orientation Left;Lateral   Pain Descriptors / Indicators Tender   Pain Type Chronic pain   Aggravating Factors  overuse   Pain Relieving Factors brace, ice                         [facility] Adult PT Treatment/Exercise - 02/23/16 0001      Cryotherapy   Number Minutes Cryotherapy 12 Minutes   Cryotherapy Location  Hand  base of thumb and wrist   Type of Cryotherapy Ice massage     Manual Therapy   Soft tissue mobilization cross friction massaage to area around 1st MCP and lateral wrist area of tenderness   Passive ROM to thumb for flexion, extension, abduction, and adduction; also opposition to fifth digit; to wrist into radial deviation (ulnar deviation too painful)   Other Manual Therapy eccentric left wrist radial deviation with therapist passively moving wrist into radial deviation x 10 reps                        [facility] Goals - 02/22/16 0802      CC Long Term Goal  #1   Title Patient will report at least 75% decrease in left base of thumb pain.   Status On-going     CC Long Term Goal  #2   Title Patient  will be independent in home exercise program and pain management for left hand and wrist.   Status On-going            Plan - 02/23/16 1437    Clinical Impression Statement Patient continues to have a lot of pain in her left lateral wrist and left thumb. Therapist continued with PROM of the left thumb and wrist and eccentric strengthening of left wrist radial deviation. Therapist is still awaiting approval from MD for addition of iontophoresis.   Rehab Potential Good   Clinical Impairments Affecting Rehab Potential none   PT Frequency 2x / week   PT Duration 4 weeks   PT Treatment/Interventions ADLs/Self Care Home Management;Electrical Stimulation;Iontophoresis 4mg /ml Dexamethasone;Moist Heat;DME Instruction;Therapeutic exercise;Patient/family education;Manual techniques;Passive range of motion;Taping;Cryotherapy   PT Next Visit Plan continue cross friction massage and ROM, progress wrist and thumb strengthening as able, continue ice massage; try iontophoresis if approved by MD   Consulted and Agree with Plan of Care Patient      Patient will benefit from skilled therapeutic intervention in order to improve the following deficits and impairments:  Pain, Decreased range of motion, Decreased strength, Impaired UE functional use  Visit Diagnosis: Pain in joints of left hand  Pain in left wrist  Stiffness of left hand, not elsewhere classified     Problem List Patient Active Problem List   Diagnosis Date Noted  . Hypotension 05/18/2015  . Fatigue 05/18/2015  . Nausea with vomiting 07/23/2014  . Dehydration 07/23/2014  . GERD (gastroesophageal reflux disease) 07/23/2014  . Neutropenia ([place]) 07/23/2014  . Breast cancer of upper-outer quadrant of right female breast ([place]) 03/20/2014  . Palpitations 03/26/2013  . Hypertension 07/02/2011  . Hyperlipidemia 07/02/2011  . CONSTIPATION 01/22/2009  . FLATULENCE-GAS-BLOATING 01/22/2009    Mellody Life 02/23/2016, 2:41  PM  [place], [place], [address] Phone: [phone]   Fax:  [phone]  Name: Mckenzie Sanford MRN: [id number] Date of Birth: May 08, 1956  Saverio Danker, SPT  Read, reviewed, edited and agree with student's findings and recommendations.   Serafina Royals, PT 02/23/16 3:56 PM

## 2016-03-01 ENCOUNTER — Ambulatory Visit: Payer: 59 | Attending: Hematology and Oncology

## 2016-03-01 DIAGNOSIS — M25642 Stiffness of left hand, not elsewhere classified: Secondary | ICD-10-CM

## 2016-03-01 DIAGNOSIS — R29898 Other symptoms and signs involving the musculoskeletal system: Secondary | ICD-10-CM

## 2016-03-01 DIAGNOSIS — M25532 Pain in left wrist: Secondary | ICD-10-CM

## 2016-03-01 DIAGNOSIS — M25542 Pain in joints of left hand: Secondary | ICD-10-CM | POA: Diagnosis present

## 2016-03-01 NOTE — Therapy (Signed)
[place], [place], [address] Phone: [phone]   Fax:  [phone]  Physical Therapy Treatment  Patient Details  Name: Mckenzie Sanford MRN: [id number] Date of Birth: 07-Mar-1956 Referring Provider: Dr. Vickki Hearing  Encounter Date: 03/01/2016      PT End of Session - 03/01/16 1432    Visit Number 9   Number of Visits 15   Date for PT Re-Evaluation 03/28/16   PT Start Time 1351   PT Stop Time 1431   PT Time Calculation (min) 40 min   Activity Tolerance Patient tolerated treatment well   Behavior During Therapy [facility] for tasks assessed/performed      Past Medical History:  Diagnosis Date  . Allergic rhinitis   . Anxiety   . Arthritis    in back  . Breast cancer ([place]) 03/12/14   right/invasive ductal carcinoma, DCIS  . Depression   . Difficulty sleeping    takes Lorazepam as needed  . HTN (hypertension)   . Hypercholesteremia   . IBS (irritable bowel syndrome)   . Radiation 11/23/14-01/07/15   Right breast  . Wears glasses     Past Surgical History:  Procedure Laterality Date  . BREAST SURGERY  11/15   lumpectomy RT breast  . COLONOSCOPY    . CYST EXCISION     right axilla  . DILATION AND CURETTAGE OF UTERUS    . PORT-A-CATH REMOVAL N/A 03/12/2015   Procedure: REMOVAL PORT-A-CATH;  Surgeon: [name], MD;  Location: [facility];  Service: General;  Laterality: N/A;  . PORTACATH PLACEMENT Right 06/01/2014   Procedure: ULTRASOUND GUIDED INSERTION PORT-A-CATH;  Surgeon: [name], MD;  Location: [place];  Service: General;  Laterality: Right;  . RADIOACTIVE SEED GUIDED MASTECTOMY WITH AXILLARY SENTINEL LYMPH NODE BIOPSY Right 04/20/2014   Procedure: RIGHT BREAST RADIOACTIVE SEED GUIDED LUMPECTOMY WITH RIGHT AXILLARY SENTINEL LYMPH NODE BIOPSY;  Surgeon: [name], MD;  Location: [place];  Service: General;  Laterality: Right;  . TONSILLECTOMY       There were no vitals filed for this visit.      Subjective Assessment - 03/01/16 1353    Subjective My pain is better today in my wrist.    Pertinent History Right breast cancer with lumpectomy 04/20/14 with 3 lymph nodes removed.  Completed chemotherapy and radiation 01/07/15.  Went back to work 11/02/14.  HTN controlled with meds but still variable.  Hyperlipidemia.  Was being seen here for back pain earlier this year, but developed left wrist pain, which she is now referred for.  She had an injection to the left wrist for pain there about two months ago and had a brace at that time; later developed base of thumb pain and now has a smaller velcro thumb brace.     Patient Stated Goals Wants the thumb and wrist to feel better, and her back too.   Currently in Pain? Yes   Pain Score 5    Pain Location Wrist   Pain Orientation Left;Lateral   Pain Descriptors / Indicators Tender   Pain Type Chronic pain   Pain Onset More than a month ago   Aggravating Factors  overuse, when it pops and not sure what causes that   Pain Relieving Factors brace, ice                         [facility] Adult PT Treatment/Exercise - 03/01/16 0001  Elbow Exercises   Wrist Flexion AROM;Left;15 reps  no pain with this    Wrist Extension Other (comment)   Wrist Extension Limitations Tried this but too painful today.     Cryotherapy   Number Minutes Cryotherapy 12 Minutes   Cryotherapy Location Hand  base of thumb/wrist   Type of Cryotherapy Ice massage     Manual Therapy   Soft tissue mobilization cross friction massaage to area around 1st MCP and lateral wrist area of tenderness   Passive ROM to thumb for flexion, extension, abduction, and adduction; also opposition to fifth digit; to wrist into radial deviation, able to tolerate some ulnar deviation today before pain started   Other Manual Therapy eccentric left wrist radial deviation with therapist passively moving wrist into radial  deviation x 10 reps                        [facility] Goals - 03/01/16 1428      CC Long Term Goal  #1   Title Patient will report at least 75% decrease in left base of thumb pain.   Baseline 50% as of 01/10/16; 60% as of 01/12/16; 50-70% depending on the day since this start of care 03/01/16    Status On-going     CC Long Term Goal  #2   Title Patient will be independent in home exercise program and pain management for left hand and wrist.   Status On-going            Plan - 03/01/16 1432    Clinical Impression Statement Pt was able to tolerate wrist flexion today but extension was painful. Her extensor carpi radialis longi and/or brevis seems to be most aggravated musculature as she has most pain at this time with wrist extension. Focused ice massage here and pt reported this feeling good after session. Overall at this point she reports 50-70% improvement depending on the day and her work load.    Rehab Potential Good   Clinical Impairments Affecting Rehab Potential none   PT Frequency 2x / week   PT Duration 4 weeks   PT Treatment/Interventions ADLs/Self Care Home Management;Electrical Stimulation;Iontophoresis 4mg /ml Dexamethasone;Moist Heat;DME Instruction;Therapeutic exercise;Patient/family education;Manual techniques;Passive range of motion;Taping;Cryotherapy   PT Next Visit Plan continue cross friction massage and ROM, progress wrist and thumb strengthening as able, continue ice massage; try iontophoresis if approved by MD   Consulted and Agree with Plan of Care Patient      Patient will benefit from skilled therapeutic intervention in order to improve the following deficits and impairments:  Pain, Decreased range of motion, Decreased strength, Impaired UE functional use  Visit Diagnosis: Pain in joints of left hand  Stiffness of left hand, not elsewhere classified  Other symptoms and signs involving the musculoskeletal system  Pain in left  wrist     Problem List Patient Active Problem List   Diagnosis Date Noted  . Hypotension 05/18/2015  . Fatigue 05/18/2015  . Nausea with vomiting 07/23/2014  . Dehydration 07/23/2014  . GERD (gastroesophageal reflux disease) 07/23/2014  . Neutropenia ([place]) 07/23/2014  . Breast cancer of upper-outer quadrant of right female breast ([place]) 03/20/2014  . Palpitations 03/26/2013  . Hypertension 07/02/2011  . Hyperlipidemia 07/02/2011  . CONSTIPATION 01/22/2009  . FLATULENCE-GAS-BLOATING 01/22/2009    Otelia Limes, PTA 03/01/2016, 3:38 PM  [place], [place], [address] Phone: [phone]   Fax:  [phone]  Name:  Mckenzie Sanford MRN: [id number] Date of Birth: [birth year]

## 2016-03-02 ENCOUNTER — Telehealth: Payer: Self-pay | Admitting: *Deleted

## 2016-03-02 NOTE — Telephone Encounter (Signed)
03/02/2016 Left message on patient's voice mail with follow up regarding the [id number] research study. Explained to patient that since switching to exemestane last month, she will need to be on that medication for at least 3 months before enrolling in the study. In addition, there are a couple of medications that she is taking that may be a concern, and noted that we are waiting on additional information, both from the study chair and in the form of an upcoming protocol amendment, about medications that are allowed on the study. Told patient that I will be reviewing further in December, once we have more information, and it is closer to the time that enrollment will be allowed. Patient told to call me back at [phone] for any further questions. Cindy S. Brigitte Pulse BSN, RN, CCRP 03/02/2016 2:18 PM

## 2016-03-06 ENCOUNTER — Ambulatory Visit: Payer: 59 | Admitting: Physical Therapy

## 2016-03-06 ENCOUNTER — Telehealth: Payer: Self-pay | Admitting: *Deleted

## 2016-03-06 DIAGNOSIS — M25542 Pain in joints of left hand: Secondary | ICD-10-CM | POA: Diagnosis not present

## 2016-03-06 DIAGNOSIS — R29898 Other symptoms and signs involving the musculoskeletal system: Secondary | ICD-10-CM

## 2016-03-06 DIAGNOSIS — M25642 Stiffness of left hand, not elsewhere classified: Secondary | ICD-10-CM

## 2016-03-06 DIAGNOSIS — M25532 Pain in left wrist: Secondary | ICD-10-CM

## 2016-03-06 NOTE — Therapy (Addendum)
[place], [place], [address] Phone: [phone]   Fax:  [phone]  Physical Therapy Treatment  Patient Details  Name: Mckenzie Sanford MRN: [id number] Date of Birth: Oct 10, 1955 Referring Provider: Dr. Vickki Hearing  Encounter Date: 03/06/2016      PT End of Session - 03/06/16 1645    Visit Number 10   Number of Visits 15   Date for PT Re-Evaluation 03/28/16   PT Start Time 4034   PT Stop Time 1430   PT Time Calculation (min) 42 min   Activity Tolerance Patient tolerated treatment well   Behavior During Therapy [facility] for tasks assessed/performed      Past Medical History:  Diagnosis Date  . Allergic rhinitis   . Anxiety   . Arthritis    in back  . Breast cancer ([place]) 03/12/14   right/invasive ductal carcinoma, DCIS  . Depression   . Difficulty sleeping    takes Lorazepam as needed  . HTN (hypertension)   . Hypercholesteremia   . IBS (irritable bowel syndrome)   . Radiation 11/23/14-01/07/15   Right breast  . Wears glasses     Past Surgical History:  Procedure Laterality Date  . BREAST SURGERY  11/15   lumpectomy RT breast  . COLONOSCOPY    . CYST EXCISION     right axilla  . DILATION AND CURETTAGE OF UTERUS    . PORT-A-CATH REMOVAL N/A 03/12/2015   Procedure: REMOVAL PORT-A-CATH;  Surgeon: [name], MD;  Location: [facility];  Service: General;  Laterality: N/A;  . PORTACATH PLACEMENT Right 06/01/2014   Procedure: ULTRASOUND GUIDED INSERTION PORT-A-CATH;  Surgeon: [name], MD;  Location: [place];  Service: General;  Laterality: Right;  . RADIOACTIVE SEED GUIDED MASTECTOMY WITH AXILLARY SENTINEL LYMPH NODE BIOPSY Right 04/20/2014   Procedure: RIGHT BREAST RADIOACTIVE SEED GUIDED LUMPECTOMY WITH RIGHT AXILLARY SENTINEL LYMPH NODE BIOPSY;  Surgeon: [name], MD;  Location: [place];  Service: General;  Laterality: Right;  .  TONSILLECTOMY      There were no vitals filed for this visit.      Subjective Assessment - 03/06/16 1350    Subjective "My thumb locks up a lot and the pain in my wrist is about the same."   Pertinent History Right breast cancer with lumpectomy 04/20/14 with 3 lymph nodes removed.  Completed chemotherapy and radiation 01/07/15.  Went back to work 11/02/14.  HTN controlled with meds but still variable.  Hyperlipidemia.  Was being seen here for back pain earlier this year, but developed left wrist pain, which she is now referred for.  She had an injection to the left wrist for pain there about two months ago and had a brace at that time; later developed base of thumb pain and now has a smaller velcro thumb brace.     Patient Stated Goals Wants the thumb and wrist to feel better, and her back too.   Currently in Pain? Yes   Pain Score 5    Pain Location Wrist   Pain Orientation Left;Lateral   Pain Descriptors / Indicators Tender   Pain Type Chronic pain   Pain Onset More than a month ago   Aggravating Factors  touching it   Pain Relieving Factors brace, ice   Pain Score 0  10/10 when it locks   Pain Location Finger (Comment which one)  thumb   Pain Orientation Left  [place] Adult PT Treatment/Exercise - 03/06/16 0001      Elbow Exercises   Wrist Flexion AROM;Left;15 reps  no pain with this    Wrist Extension AROM;Left;10 reps  eccentric lowering, therapist assist with concentric     Wrist Exercises   Wrist Radial Deviation AROM;10 reps  eccentric lowering with therapist assist with concentric   Wrist Ulnar Deviation Strengthening;Left;10 reps;Theraband   Theraband Level (Ulnar Deviation) Level 1 (Yellow)     Cryotherapy   Number Minutes Cryotherapy 10 Minutes   Cryotherapy Location Hand  base of thumb/wrist   Type of Cryotherapy Ice massage     Manual Therapy   Soft tissue mobilization cross friction massaage to area around 1st MCP and  lateral wrist area of tenderness   Passive ROM to thumb for flexion, extension, abduction, and adduction; also opposition to fifth digit; to wrist into radial deviation, able to tolerate more ulnar deviation today before pain started   Other Manual Therapy eccentric left thumb extension and abduction with manual resistance 10 reps each                        [facility] Goals - 03/01/16 1428      CC Long Term Goal  #1   Title Patient will report at least 75% decrease in left base of thumb pain.   Baseline 50% as of 01/10/16; 60% as of 01/12/16; 50-70% depending on the day since this start of care 03/01/16    Status On-going     CC Long Term Goal  #2   Title Patient will be independent in home exercise program and pain management for left hand and wrist.   Status On-going            Plan - 03/06/16 1645    Clinical Impression Statement Patient reports wrist pain is improving with patient able to tolerate increased left wrist ulnar deviation PROM. She was also able to tolerate performance of more wrist and thumb strengthening exercises today. Patient reports her left thumb "locks up" when she bends it and this is very painful; this has also occurred once in her left 2nd digit. Therapist instructed her to schedule an appointment with Dr. Delilah Shan to follow-up on this issue.   Rehab Potential Good   Clinical Impairments Affecting Rehab Potential none   PT Frequency 2x / week   PT Duration 4 weeks   PT Treatment/Interventions ADLs/Self Care Home Management;Electrical Stimulation;Iontophoresis 78m/ml Dexamethasone;Moist Heat;DME Instruction;Therapeutic exercise;Patient/family education;Manual techniques;Passive range of motion;Taping;Cryotherapy   PT Next Visit Plan continue cross friction massage and ROM, progress wrist and thumb strengthening as able, continue ice massage; try iontophoresis if approved by MD   Consulted and Agree with Plan of Care Patient      Patient  will benefit from skilled therapeutic intervention in order to improve the following deficits and impairments:  Pain, Decreased range of motion, Decreased strength, Impaired UE functional use  Visit Diagnosis: Pain in joints of left hand  Stiffness of left hand, not elsewhere classified  Other symptoms and signs involving the musculoskeletal system  Pain in left wrist     Problem List Patient Active Problem List   Diagnosis Date Noted  . Hypotension 05/18/2015  . Fatigue 05/18/2015  . Nausea with vomiting 07/23/2014  . Dehydration 07/23/2014  . GERD (gastroesophageal reflux disease) 07/23/2014  . Neutropenia ([place] 07/23/2014  . Breast cancer of upper-outer quadrant of right female breast ([place] 03/20/2014  . Palpitations 03/26/2013  .  Hypertension 07/02/2011  . Hyperlipidemia 07/02/2011  . CONSTIPATION 01/22/2009  . FLATULENCE-GAS-BLOATING 01/22/2009    Mellody Life 03/06/2016, 4:50 PM  [place], [place], [address] Phone: [phone]   Fax:  [phone]  Name: Mckenzie Sanford MRN: [id number] Date of Birth: 03/10/1956   Saverio Danker, SPT  Read, reviewed, edited and agree with student's findings and recommendations.   Serafina Royals, PT 03/06/16 9:49 PM  PHYSICAL THERAPY DISCHARGE SUMMARY  Visits from Start of Care: 10  Current functional level related to goals / functional outcomes: Goals partially met as noted above.   Remaining deficits: Unknown, as patient did not continue therapy as was the plan at the time of this visit.   Education / Equipment: Home exercise program, use of cryotherapy. Plan: Patient agrees to discharge.  Patient goals were not met. Patient is being discharged due to not returning since the last visit.  ?????    Serafina Royals, PT 09/18/17 11:27 AM

## 2016-03-06 NOTE — Telephone Encounter (Signed)
03/06/2016 Received return phone call from patient today, regarding my message last week about the [id number] REMEMBER trial for cognitive impairment. Explained to patient about the waiting period for her to be on the same hormonal agent (exemestane) for at least 3 months prior to enrollment (started approximately 02/16/16). In addition, discussed that we are awaiting clarification regarding whether or not Zoloft will be allowed, and if the use of metoprolol, as a bradycardia-causing agent, will be an exclusion. Explained that we hope to have more information about these drugs by the time of her December appointment, and that we can follow up further at that time. Thanked patient for her interest in the trial. Cindy S. Brigitte Pulse BSN, RN, [place] 03/06/2016 11:29 AM

## 2016-03-08 ENCOUNTER — Encounter: Payer: 59 | Admitting: Physical Therapy

## 2016-03-13 ENCOUNTER — Ambulatory Visit: Payer: 59 | Admitting: Physical Therapy

## 2016-03-15 ENCOUNTER — Encounter: Payer: 59 | Admitting: Physical Therapy

## 2016-03-20 ENCOUNTER — Encounter: Payer: 59 | Admitting: Physical Therapy

## 2016-03-22 ENCOUNTER — Encounter: Payer: 59 | Admitting: Physical Therapy

## 2016-03-28 ENCOUNTER — Encounter: Payer: 59 | Admitting: Physical Therapy

## 2016-03-30 ENCOUNTER — Encounter: Payer: 59 | Admitting: Physical Therapy

## 2016-04-03 ENCOUNTER — Encounter: Payer: 59 | Admitting: Physical Therapy

## 2016-04-05 ENCOUNTER — Encounter: Payer: 59 | Admitting: Physical Therapy

## 2016-04-10 ENCOUNTER — Encounter: Payer: 59 | Admitting: Physical Therapy

## 2016-04-12 ENCOUNTER — Encounter: Payer: 59 | Admitting: Physical Therapy

## 2016-04-17 ENCOUNTER — Encounter: Payer: 59 | Admitting: Physical Therapy

## 2016-04-24 ENCOUNTER — Encounter: Payer: 59 | Admitting: Physical Therapy

## 2016-04-26 ENCOUNTER — Encounter: Payer: 59 | Admitting: Physical Therapy

## 2016-05-15 ENCOUNTER — Other Ambulatory Visit: Payer: Self-pay | Admitting: Obstetrics and Gynecology

## 2016-05-15 ENCOUNTER — Ambulatory Visit (HOSPITAL_BASED_OUTPATIENT_CLINIC_OR_DEPARTMENT_OTHER): Payer: BLUE CROSS/BLUE SHIELD | Admitting: Hematology and Oncology

## 2016-05-15 ENCOUNTER — Encounter: Payer: Self-pay | Admitting: Hematology and Oncology

## 2016-05-15 ENCOUNTER — Encounter: Payer: Self-pay | Admitting: *Deleted

## 2016-05-15 DIAGNOSIS — G3184 Mild cognitive impairment, so stated: Secondary | ICD-10-CM

## 2016-05-15 DIAGNOSIS — R109 Unspecified abdominal pain: Secondary | ICD-10-CM

## 2016-05-15 DIAGNOSIS — Z170 Estrogen receptor positive status [ER+]: Secondary | ICD-10-CM

## 2016-05-15 DIAGNOSIS — C50411 Malignant neoplasm of upper-outer quadrant of right female breast: Secondary | ICD-10-CM | POA: Diagnosis not present

## 2016-05-15 NOTE — Progress Notes (Signed)
Patient Care Team: [name], MD as PCP - General (Internal Medicine) [name], MD as Consulting Physician (General Surgery) [name], MD as Consulting Physician (Hematology and Oncology) [name], MD as Consulting Physician (Radiation Oncology) [name], MD as Consulting Physician (Obstetrics and Gynecology) [name], NP as Nurse Practitioner (Hematology and Oncology)  DIAGNOSIS:  Encounter Diagnosis  Name Primary?  . Malignant neoplasm of upper-outer quadrant of right breast in female, estrogen receptor positive ([place])     SUMMARY OF ONCOLOGIC HISTORY:   Breast cancer of upper-outer quadrant of right female breast ([place])   03/10/2014 Mammogram    Right breast: mass      03/10/2014 Breast US    Right breast: 1 cm irregular mass, UOQ, anterior depth, hypoechoic with posterior acoustic shadowing.  LN with focal cortex thickening in right axilla.      03/12/2014 Initial Biopsy    Right breast core needle bx: Invasive ductal carcinoma with DCIS, right axillary lymph node biopsy negative, ER+ (100%), PR+ (84%), Ki-67 16%, HER-2 negative (ratio 1.22)      03/20/2014 Breast MRI    Right breast UOQ: 2.6 x 1.9 x 1.7 cm invasive ductal carcinoma with extension and involvement of the adjacent skin and nipple area complex, no abnormal lymph nodes      03/21/2014 Clinical Stage    Stage IA (T1b, N0, cM0)      04/20/2014 Definitive Surgery    Right breast lumpectomy/SLNB([name]): IDC grade 2; 2.5 cm with intermediate to high-grade DCIS margins negative: 1/3 positive sentinel lymph node with extracapsular extension, ER 100%, PR 84%, HER-2 negative ratio 1.22, Ki-67 16%      04/20/2014 Pathologic Stage     Stage IIB: pT2, pN1a, cM0       06/04/2014 - 10/22/2014 Adjuvant Chemotherapy    Dose dense doxorubicin and cyclophosphamide x 4 cycles followed by paclitaxel x 1 then nab-paclitaxel x 11 (total 12 cycles)      11/23/2014 -  01/07/2015 Radiation Therapy    Adjuvant RT [name]): Right breast / 45 Gray over 25 fractions; Right breast boost / 16 Gray over 8 fractions      02/17/2015 -  Anti-estrogen oral therapy    Anastrozole 1 mg daily. Switched to letrozole and then switched to exemestane 02/16/2016      04/01/2015 Survivorship    Survivorship care plan completed and given to patient.      CHIEF COMPLIANT: Follow-up on exemestane  INTERVAL HISTORY: Mckenzie Sanford is a 60 year old with above-mentioned history of right breast cancer treated with lumpectomy followed by adjuvant chemotherapy and radiation and is currently on adjuvant antiestrogen therapy. She was initially on anastrozole which she could not tolerate and we switched her to letrozole. We then changed treatment to exemestane. She is also having problems with cognitive dysfunction. She is tolerating exemestane much better. She has occasional aches and pains especially tendinitis in the wrist. She also has intermittent hot flashes.  REVIEW OF SYSTEMS:   Constitutional: Denies fevers, chills or abnormal weight loss Eyes: Denies blurriness of vision Ears, nose, mouth, throat, and face: Denies mucositis or sore throat Respiratory: Denies cough, dyspnea or wheezes Cardiovascular: Denies palpitation, chest discomfort Gastrointestinal:  Denies nausea, heartburn or change in bowel habits Skin: Denies abnormal skin rashes Lymphatics: Denies new lymphadenopathy or easy bruising Neurological:Denies numbness, tingling or new weaknesses Behavioral/Psych: Mood is stable, no new changes  Extremities: No lower extremity edema Breast:  denies any pain or lumps or nodules in either breasts  All other systems were reviewed with the patient and are negative.  I have reviewed the past medical history, past surgical history, social history and family history with the patient and they are unchanged from previous note.  ALLERGIES:  is allergic to shellfish allergy;  ivp dye [iodinated diagnostic agents]; and penicillins.  MEDICATIONS:  Current Outpatient Prescriptions  Medication Sig Dispense Refill  . amLODipine (NORVASC) 10 MG tablet Take 5 mg by mouth at bedtime.     Marland Kitchen atorvastatin (LIPITOR) 20 MG tablet Take 20 mg by mouth daily.    . Calcium Carb-Cholecalciferol (LIQUID CALCIUM WITH D3) [phone] MG-UNIT CAPS Take 600 mg by mouth. Calcium 600 mg with 400 iu of D3    . diphenhydrAMINE (BENADRYL) 25 mg capsule Take 25 mg by mouth every 6 (six) hours as needed.    Marland Kitchen exemestane (AROMASIN) 25 MG tablet Take 1 tablet (25 mg total) by mouth daily after breakfast. 90 tablet 3  . irbesartan (AVAPRO) 150 MG tablet Take 150 mg by mouth 2 (two) times daily.     Marland Kitchen LINZESS 290 MCG CAPS capsule Take 290 mcg by mouth daily as needed.  11  . metaxalone (SKELAXIN) 800 MG tablet TAKE 1/2 TO 1 TABLET EVERY 8 HOURS AS NEEDED FOR MUSCLE RELAXATION  2  . metoprolol succinate (TOPROL-XL) 50 MG 24 hr tablet Take 50 mg by mouth every morning. Take with or immediately following a meal.    . Polyethyl Glycol-Propyl Glycol (SYSTANE OP) Apply 1 drop to eye daily as needed (Dry eyes).    . sertraline (ZOLOFT) 25 MG tablet Take 50 mg by mouth daily.   11  . traMADol (ULTRAM) 50 MG tablet Take 1 tablet (50 mg total) by mouth every 12 (twelve) hours as needed. 60 tablet 0   No current facility-administered medications for this visit.     PHYSICAL EXAMINATION: ECOG PERFORMANCE STATUS: 1 - Symptomatic but completely ambulatory  Vitals:   05/15/16 1126  BP: (!) 152/86  Pulse: 66  Resp: 18  Temp: 97.9 F (36.6 C)   Filed Weights   05/15/16 1126  Weight: 132 lb 6.4 oz (60.1 kg)    GENERAL:alert, no distress and comfortable SKIN: skin color, texture, turgor are normal, no rashes or significant lesions EYES: normal, Conjunctiva are pink and non-injected, sclera clear OROPHARYNX:no exudate, no erythema and lips, buccal mucosa, and tongue normal  NECK: supple, thyroid normal  size, non-tender, without nodularity LYMPH:  no palpable lymphadenopathy in the cervical, axillary or inguinal LUNGS: clear to auscultation and percussion with normal breathing effort HEART: regular rate & rhythm and no murmurs and no lower extremity edema ABDOMEN:abdomen soft, non-tender and normal bowel sounds MUSCULOSKELETAL:no cyanosis of digits and no clubbing  NEURO: alert & oriented x 3 with fluent speech, no focal motor/sensory deficits EXTREMITIES: No lower extremity edema BREAST: No palpable masses or nodules in either right or left breasts. No palpable axillary supraclavicular or infraclavicular adenopathy no breast tenderness or nipple discharge. (exam performed in the presence of a chaperone)  LABORATORY DATA:  I have reviewed the data as listed   Chemistry      Component Value Date/Time   NA 142 02/17/2015 1453   K 3.6 02/17/2015 1453   CL 105 09/10/2014 0909   CO2 29 02/17/2015 1453   BUN 15.7 02/17/2015 1453   CREATININE 0.7 02/17/2015 1453      Component Value Date/Time   CALCIUM 10.0 02/17/2015 1453   ALKPHOS 151 (H) 02/17/2015 1453   AST  28 02/17/2015 1453   ALT 38 02/17/2015 1453   BILITOT <0.30 02/17/2015 1453       Lab Results  Component Value Date   WBC 4.6 02/17/2015   HGB 13.5 02/17/2015   HCT 39.3 02/17/2015   MCV 88.7 02/17/2015   PLT 277 02/17/2015   NEUTROABS 2.8 02/17/2015    ASSESSMENT & PLAN:  Breast cancer of upper-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  Current treatment: anastrozole 1 mg daily 5 years  started 02/16/2015, Switched to letrozole, switched to exemestane on 02/16/2016 (due to severe hot flashes myalgias)  Exemestane toxicities: 1.Mild to moderate tendinitis in the left wrist 2. intermittent hot flashes 3. Fatigue but able to manage her job and activities.  Bone density test done at [facility] revealed a T score of -2: Osteopenia: I encouraged her to take calcium and vitamin D and weightbearing exercises.  Cognitive dysfunction: Patient is being evaluated for clinical trial for cognitive dysfunction [place] [id number] randomizing patients with moderate to severe cognitive dysfunction between donepezil and placebo  Return to clinic in 6 months for follow-up  No orders of the defined types were placed in this encounter.  The patient has a good understanding of the overall plan. she agrees with it. she will call with any problems that may develop before the next visit here.   Rulon Eisenmenger, MD 05/15/16

## 2016-05-15 NOTE — Assessment & Plan Note (Signed)
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  Current treatment: anastrozole 1 mg daily 5 years started 02/16/2015, Switched to letrozole, switched to exemestane on 02/16/2016 (due to severe hot flashes myalgias)  Exemestane toxicities: 1.  [facility] test done [facility] revealed a T score of -2: Osteopenia: I encouraged her to take calcium and vitamin D and weightbearing exercises.  Cognitive dysfunction: Patient agreed to enroll in clinical trial for cognitive dysfunction [place] [id number] randomizing patients with moderate to severe cognitive dysfunction between donepezil and placebo  Return to clinic in 6 months for follow-up

## 2016-05-16 IMAGING — MR MR BREAST BILATERAL W WO CONTRAST
9 of 13 series · 32 of 48 positions shown · IV contrast (12ml Multihance)
Comparison: Recent mammogram, ultrasound and biopsy examinations at
[facility].

CLINICAL DATA: Recently diagnosed right breast invasive ductal
carcinoma.

LABS:  BUN and creatinine were obtained on site at [facility]HOSPITAL]
[facility]OS[facility]
Results:  BUN 12 mg/dL,  Creatinine 0.8 mg/dL.
EXAM:
BILATERAL BREAST MRI WITH AND WITHOUT CONTRAST
TECHNIQUE: Multiplanar, multisequence MR images of both breasts were obtained
prior to and following the intravenous administration of 12ml of
MultiHance.

[Series 2: T2 · axial · 3.0mm · 0.94mm/px · z∈[-84,+90]mm · 3 of 59 slices shown]
[im 1/59]
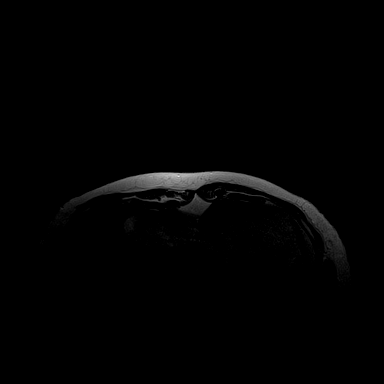
[im 30/59]
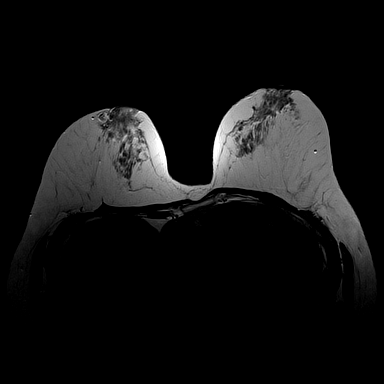
[im 59/59]
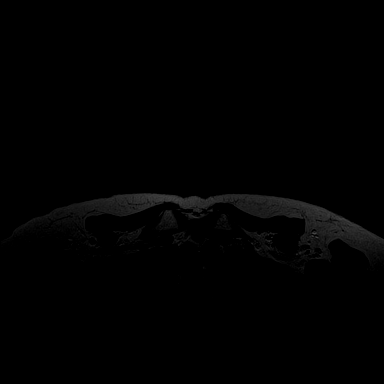

[Series 3: t2_tirm_tra ipat (a-p) · axial · 3.0mm · 0.70mm/px · z∈[-84,+90]mm · 2 of 59 slices shown]
[im 1/59]
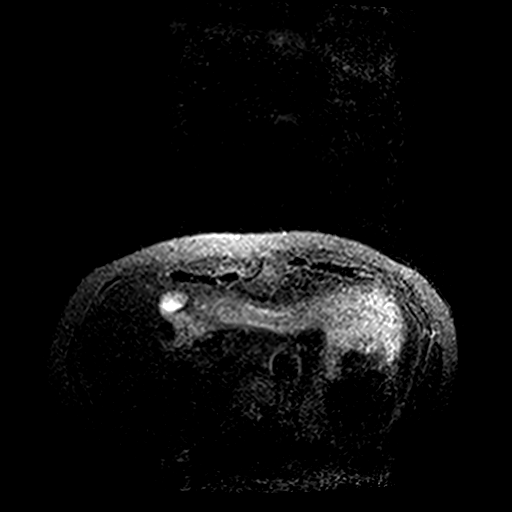
[im 59/59]
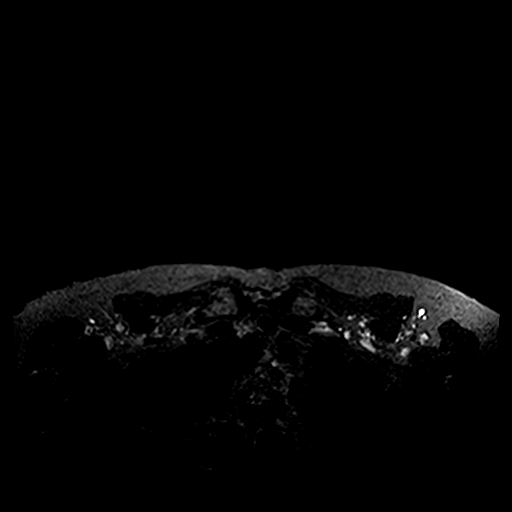

[Series 4: fl3d pre-cm no · axial · non-contrast · 1.2mm · 0.94mm/px · z∈[-93,+98]mm · 5 of 160 slices shown]
[im 1/160]
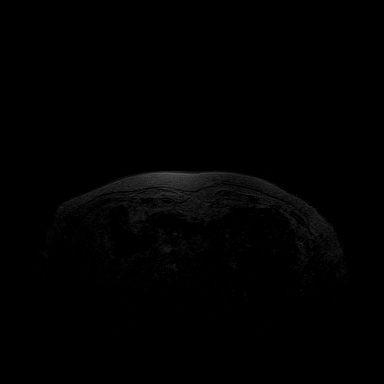
[im 40/160]
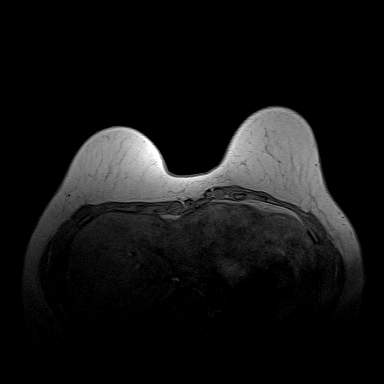
[im 80/160]
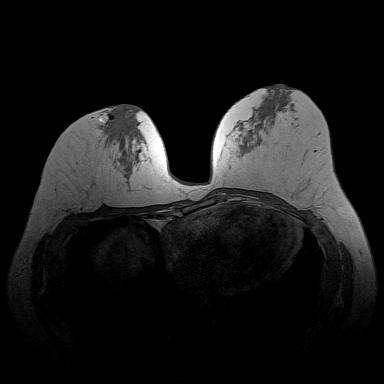
[im 120/160]
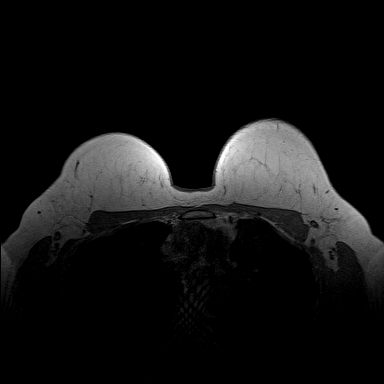
[im 160/160]
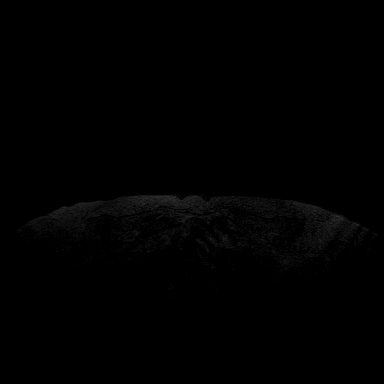

[Series 5: fl3d pre-cm · axial · non-contrast · 1.2mm · 0.94mm/px · z∈[-93,+98]mm · 5 of 160 slices shown]
[im 1/160]
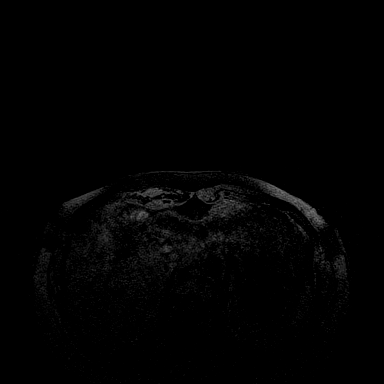
[im 40/160]
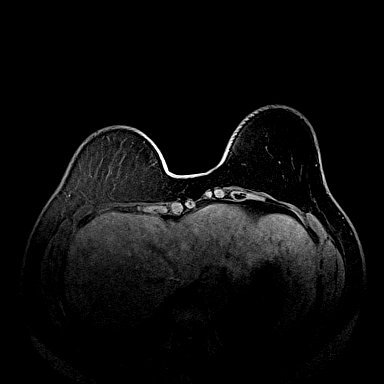
[im 80/160]
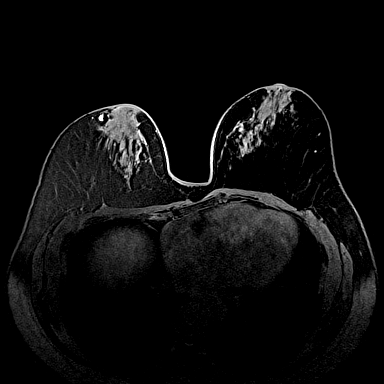
[im 120/160]
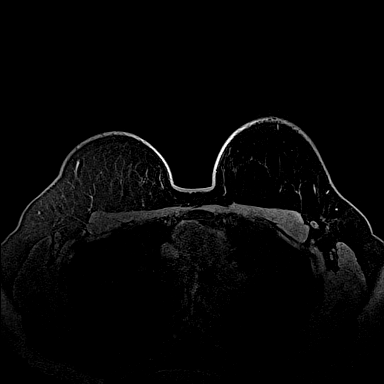
[im 160/160]
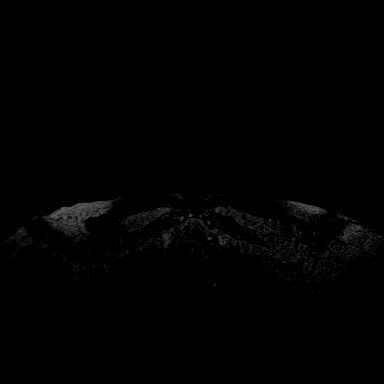

[Series 6: fl3d post-cm 20 · axial · 1.2mm · 0.94mm/px · z∈[-93,+98]mm · 5 of 160 slices shown (1 of 3)]
[im 1/160]
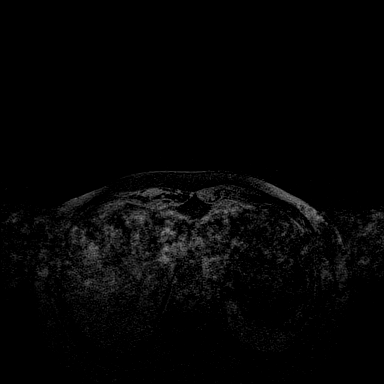
[im 40/160]
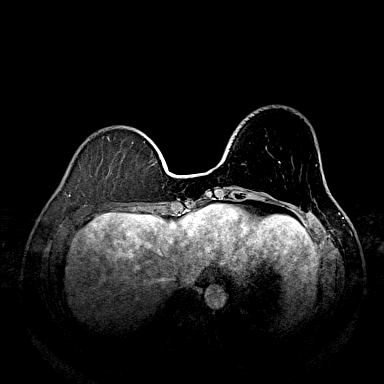
[im 80/160]
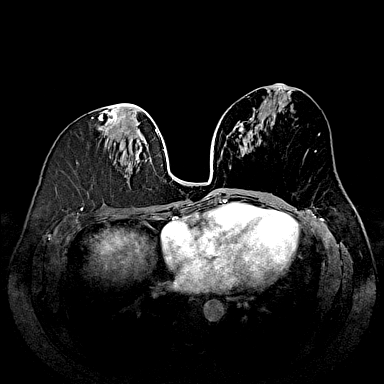
[im 120/160]
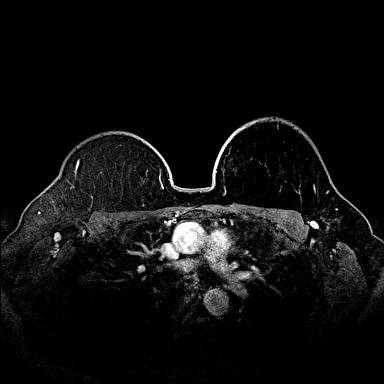
[im 160/160]
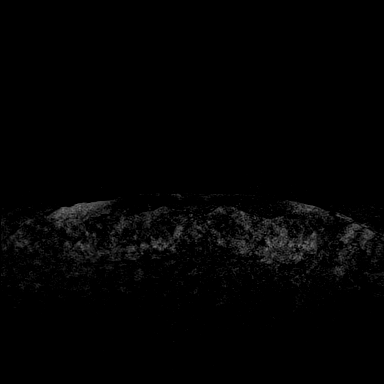

[Series 7: fl3d post-cm 20 · axial · 1.2mm · 0.94mm/px · z∈[-93,+98]mm · 5 of 160 slices shown (2 of 3)]
[im 1/160]
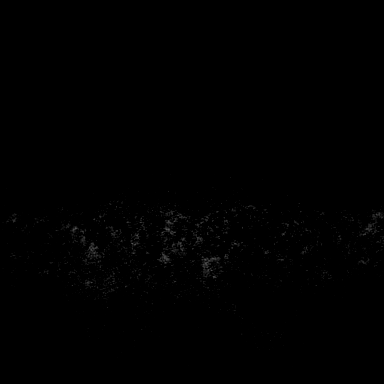
[im 40/160]
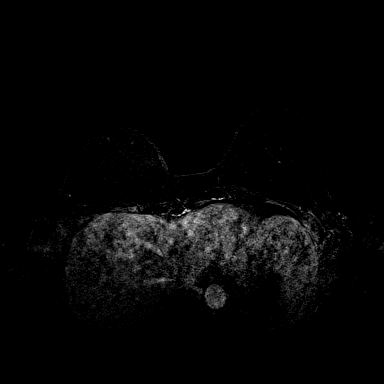
[im 80/160]
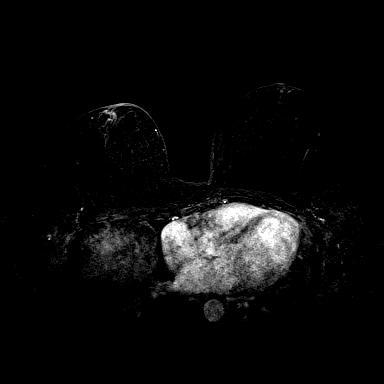
[im 120/160]
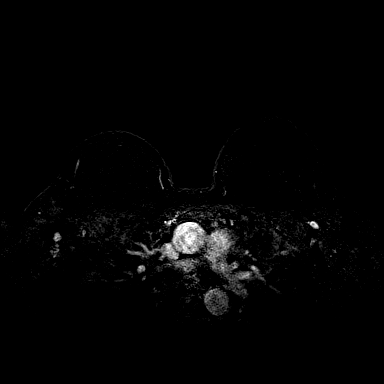
[im 160/160]
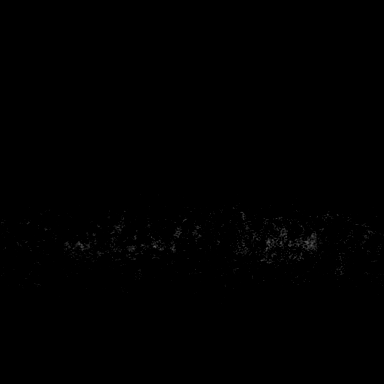

[Series 8: fl3d post-cm 20 · axial · 192.0mm · 0.94mm/px · 1 of 1 slices shown (3 of 3)]
[im 1/1]
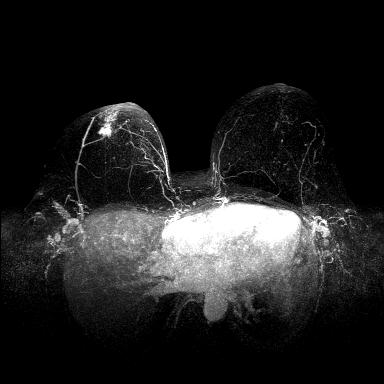

[Series 9: fl3d post-cm 3min · axial · 1.2mm · 0.94mm/px · z∈[-93,+98]mm · 5 of 160 slices shown]
[im 1/160]
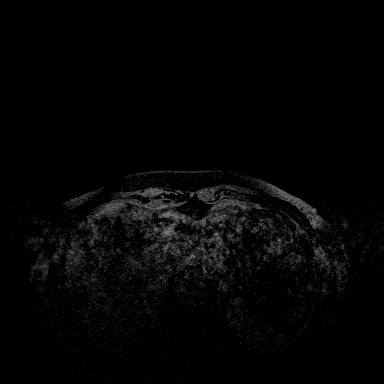
[im 40/160]
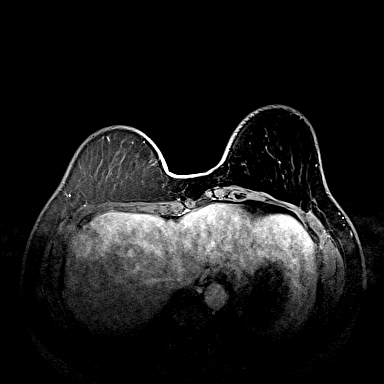
[im 80/160]
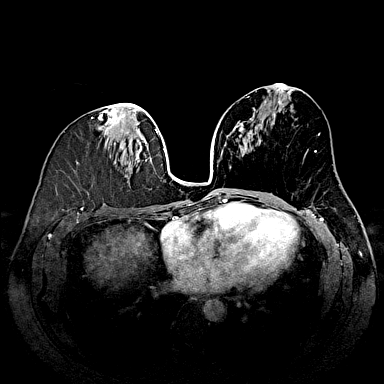
[im 120/160]
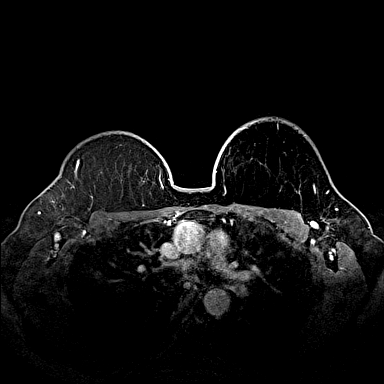
[im 160/160]
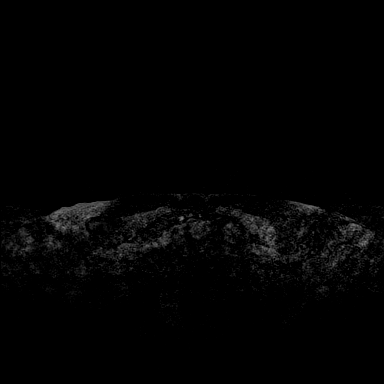

[Series 10: fl3d post-cm 3min_sub · axial · 1.2mm · 0.94mm/px · 1 of 160 slices shown]
[im 1/160]
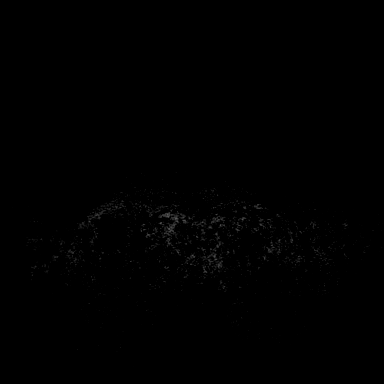

[32 of 48 positions shown; findings below may reference images not displayed]

THREE-DIMENSIONAL MR IMAGE RENDERING ON INDEPENDENT WORKSTATION:

Three-dimensional MR images were rendered by post-processing of the
original MR data on an independent workstation. The
three-dimensional MR images were interpreted, and findings are
reported in the following complete MRI report for this study. Three
dimensional images were evaluated at the independent [employer]
workstation
FINDINGS: Breast composition: c:  Heterogeneous fibroglandular tissue

Background parenchymal enhancement: Minimal

Right breast: There is an irregular, enhancing mass in the anterior
aspect of the upper-outer quadrant of the right breast extending to
the skin anteriorly with associated skin thickening and enhancement.
The skin thickening and enhancement is involving the nipple areolar
complex. The mass contains a biopsy marker clip artifact anteriorly.
The mass measures 2.6 x 1.9 x 1.7 cm in maximum dimensions and has a
mixture of enhancement kinetics, including a small amount of rapid
wash-in/washout.

Left breast: No mass or abnormal enhancement.

Lymph nodes: No abnormal appearing lymph nodes.

Ancillary findings:  None.
IMPRESSION: 2.6 x 1.9 x 1.7 cm biopsy-proven invasive ductal carcinoma in the
anterior aspect of the upper-outer right breast with extension to
and involvement of the adjacent skin and nipple-areolar complex.

RECOMMENDATION:
Treatment plan.

BI-RADS CATEGORY  6: Known biopsy-proven malignancy.

## 2016-05-18 ENCOUNTER — Telehealth: Payer: Self-pay | Admitting: *Deleted

## 2016-05-18 NOTE — Telephone Encounter (Signed)
05/18/2016 Spoke with patient by phone regarding a status report on screening for the [place] [address] REMEMBER study. Explained to patient that we received correspondence from the study staff that metoprolol will not be allowed on the study, since it is an agent that causes slowing of the heart rate. Also explained to patient that Zoloft is allowed, however, it would require that she have an EKG performed before enrollment to assess the conduction of the heart, to see if it meets the requirements for the study. Also explained that the most recent correspondence does not indicate that the study will pay for an EKG, and that the EKG may be billed to insurance, if she has any concerns regarding that. Told patient that Dr. [name] preferred that Dr. Dagmar Hait make a decision regarding changing medications, and that he will contact him regarding whether an acceptable alternative exists. Plans made to follow up with Dr. [name] by mid- to late the first week in January on the status. If a change is made, then we would likely have her return to [facility] for an EKG after the change has been in effect. In addition, we are waiting on instructions from the study staff regarding length of time for a washout period, after medication changes have taken place, for determining eligibility. Patient states that her insurance changed from [facility] to [facility] on December 1st, and that this is the insurance she will have in place next year. Patient is aware that our financial/managed care office will confirm if the patient's insurance allows participation in a clinical trial. Cindy S. Brigitte Pulse BSN, RN, CCRP 05/18/2016 1:53 PM

## 2016-05-23 ENCOUNTER — Encounter (INDEPENDENT_AMBULATORY_CARE_PROVIDER_SITE_OTHER): Payer: Self-pay

## 2016-05-23 ENCOUNTER — Ambulatory Visit
Admission: RE | Admit: 2016-05-23 | Discharge: 2016-05-23 | Disposition: A | Discharge status: Home / Self Care | Payer: BLUE CROSS/BLUE SHIELD | Source: Ambulatory Visit | Attending: Obstetrics and Gynecology | Admitting: Obstetrics and Gynecology

## 2016-05-23 DIAGNOSIS — R109 Unspecified abdominal pain: Secondary | ICD-10-CM

## 2016-06-06 ENCOUNTER — Encounter: Payer: Self-pay | Admitting: Internal Medicine

## 2016-06-07 ENCOUNTER — Telehealth: Payer: Self-pay | Admitting: *Deleted

## 2016-06-07 NOTE — Telephone Encounter (Addendum)
06/07/2016 Left message on patient's voice mail regarding concomitant medications and research study participation. Noted that Dr. [name] had notified me that he left a message for the patient regarding stopping metoprolol. In addition, Dr. Dagmar Hait would need to consider whether an alternative exists for restricted medications. Asked patient to return my call at [phone] to further discuss the status of her medications. Cindy S. Brigitte Pulse BSN, RN, [place] 06/07/2016 9:59 AM  06/07/2016 Received return voice mail message from patient. She states that she contacted Dr. Danna Hefty office regarding changing her medications to qualify for study enrollment. Patient states that they are willing to do so, but would like to hear from Dr. [name] more information regarding the study and what is allowed. Protocol information will be faxed to Dr. Dagmar Hait per patient request. Gwynn Burly. Brigitte Pulse BSN, RN, CCRP 06/07/2016 11:44 AM  06/07/2016 Left voice mail message for patient to let her know that I have faxed information to Dr. Dagmar Hait, and for her to follow up with his office in a couple of days, to see if the information has been reviewed regarding medications allowed on the study. Plans made to either contact the patient next week, or for patient to contact me to provide an update. Cindy S. Brigitte Pulse BSN, RN, [place] 06/07/2016 2:44 PM

## 2016-06-09 ENCOUNTER — Other Ambulatory Visit: Payer: Self-pay | Admitting: Nurse Practitioner

## 2016-06-26 ENCOUNTER — Telehealth: Payer: Self-pay | Admitting: *Deleted

## 2016-06-26 NOTE — Telephone Encounter (Signed)
06/26/2016 Received phone call from Jaci Carrel, Dr. Geralyn Flash nurse, this morning, stating that she received a message from the patient that Dr. Dagmar Hait needed more information about participation in the [id number] research study. Copies of items that were faxed to Dr. Danna Hefty office on 06/07/2016 were taken to Dr. [name] for his review, and for further discussion between the investigator and Dr. Dagmar Hait. Spoke with patient by phone this afternoon to inform her of the above. Patient related that Dr. Danna Hefty nurse, Amy, indicated that more information was needed regarding potential medication changes. Patient is aware that information has been relayed that can be discussed between the two physicians.   Also discussed with patient new information from the study indicating that the pre-screening tests will need to be administered again, as the study chair has indicated that a greater than 2 month delay between pre-screening and enrollment is going to require re-testing for eligibility. This could mean that after the changes to medications are made and the pre-screening test is re-administered, she may not meet the requirements for eligibility. Patient also notes that if her medication is changed, her physician may want to monitor her for a while to make sure she is adjusting to it. Acknowledged that this might be in the patient's best interest, and is acceptable since we have time for study enrollment, as patient is currently less than two years from completion of chemotherapy. At this point, patient will wait for more information from Dr. Dagmar Hait. Cindy S. Brigitte Pulse BSN, RN, [place] 06/26/2016 4:15 PM

## 2016-06-27 ENCOUNTER — Telehealth: Payer: Self-pay | Admitting: Hematology and Oncology

## 2016-06-27 NOTE — Telephone Encounter (Signed)
I called Dr. Dagmar Hait to discuss the fact that patient is on metoprolol and Zoloft both of these drugs are not allowed on the cognitive dysfunction clinical trial. Dr. Dagmar Hait felt that it is essential for her to remain on both of these medications at the have kept her in a very good control. Patient has anxiety issues and he felt that these were the best medications for her. He also said that he will discuss with the patient about treatment options for cognitive dysfunction and even consider putting her on Aricept if necessary. I left a message for the patient with this information. We will not place her on the REMEMBER clinical trial.

## 2016-07-14 ENCOUNTER — Encounter: Payer: Self-pay | Admitting: Internal Medicine

## 2016-07-14 ENCOUNTER — Ambulatory Visit (INDEPENDENT_AMBULATORY_CARE_PROVIDER_SITE_OTHER): Payer: BLUE CROSS/BLUE SHIELD | Admitting: Internal Medicine

## 2016-07-14 VITALS — BP 128/68 | HR 72 | Ht 62.0 in | Wt 135.1 lb

## 2016-07-14 DIAGNOSIS — K5900 Constipation, unspecified: Secondary | ICD-10-CM

## 2016-07-14 DIAGNOSIS — K589 Irritable bowel syndrome without diarrhea: Secondary | ICD-10-CM

## 2016-07-14 DIAGNOSIS — R140 Abdominal distension (gaseous): Secondary | ICD-10-CM | POA: Diagnosis not present

## 2016-07-14 NOTE — Patient Instructions (Signed)
Continue your Linzess and adjust your Miralax as directed by Dr. Henrene Pastor  Please follow up as needed

## 2016-07-14 NOTE — Progress Notes (Signed)
HISTORY OF PRESENT ILLNESS:  Mckenzie Sanford is a 61 y.o. female with past medical history as listed below who is referred by her gynecologist Dr. Garwin Brothers regarding regarding complaints of abdominal bloating and constipation. She is late for her appointment but we were able to work her in. The Patient has long-standing constipation predominant irritable bowel syndrome. She did undergo complete colonoscopy 02/16/2009. The examination was entirely normal. Follow-up in 10 years recommended. In the office evaluation prior to that examination complaints were abdominal bloating associated with discomfort and long-standing constipation. She reports that the complaints currently are the same. She tells me that she has been on linzess 290 g daily for several years. She states that this helps. However, she reports a bowel movement about every 3 days and will have intermittent issues with bloating and abdominal discomfort. She does report some weight gain. No bleeding. She reports taking tramadol 4-5 times per week for chronic back pain. She is aware that this is constipating. Defecation clearly helps her symptoms. She has tried MiraLAX in the past. When taking 1 capful per day she would develop issues with diarrhea. She has not titrated different dosages. She did tolerate the agent however. No family history of colon cancer. She continues her general medical care with Dr. Dagmar Hait and has her annual evaluation upcoming shortly, I am told.  REVIEW OF SYSTEMS:  All non-GI ROS negative except for insomnia, back pain, anxiety, arthritis  Past Medical History:  Diagnosis Date  . Allergic rhinitis   . Anxiety   . Arthritis    in back  . Breast cancer ([place]) 03/12/14   right/invasive ductal carcinoma, DCIS  . Depression   . Difficulty sleeping    takes Lorazepam as needed  . HTN (hypertension)   . Hypercholesteremia   . IBS (irritable bowel syndrome)   . Radiation 11/23/14-01/07/15   Right breast  . Wears glasses      Past Surgical History:  Procedure Laterality Date  . BREAST SURGERY  11/15   lumpectomy RT breast  . COLONOSCOPY    . CYST EXCISION     right axilla  . DILATION AND CURETTAGE OF UTERUS    . PORT-A-CATH REMOVAL N/A 03/12/2015   Procedure: REMOVAL PORT-A-CATH;  Surgeon: [name], MD;  Location: [facility];  Service: General;  Laterality: N/A;  . PORTACATH PLACEMENT Right 06/01/2014   Procedure: ULTRASOUND GUIDED INSERTION PORT-A-CATH;  Surgeon: [name], MD;  Location: [place];  Service: General;  Laterality: Right;  . RADIOACTIVE SEED GUIDED MASTECTOMY WITH AXILLARY SENTINEL LYMPH NODE BIOPSY Right 04/20/2014   Procedure: RIGHT BREAST RADIOACTIVE SEED GUIDED LUMPECTOMY WITH RIGHT AXILLARY SENTINEL LYMPH NODE BIOPSY;  Surgeon: [name], MD;  Location: [place];  Service: General;  Laterality: Right;  . TONSILLECTOMY      Social History Samie L Achterberg  reports that she has never smoked. She has never used smokeless tobacco. She reports that she drinks alcohol. She reports that she does not use drugs.  family history includes Heart attack in her father.  Allergies  Allergen Reactions  . Shellfish Allergy Swelling  . Ivp Dye [Iodinated Diagnostic Agents]   . Penicillins Rash    Pt said this was a child hood allergy and cannot remember answers questions        PHYSICAL EXAMINATION: Vital signs: BP 128/68   Pulse 72   Ht 5\' 2"  (1.575 m)   Wt 135 lb 2 oz (61.3 kg)   LMP 09/26/2011  BMI 24.71 kg/m   Constitutional: generally well-appearing, no acute distress Psychiatric: alert and oriented x3, cooperative Eyes: extraocular movements intact, anicteric, conjunctiva pink Mouth: oral pharynx moist, no lesions Neck: supple no lymphadenopathy Cardiovascular: heart regular rate and rhythm, no murmur Lungs: clear to auscultation bilaterally Abdomen: soft, nontender, nondistended, no obvious ascites, no peritoneal signs, normal  bowel sounds, no organomegaly Rectal: Omitted Extremities: no clubbing cyanosis or lower extremity edema bilaterally Skin: no lesions on visible extremities Neuro: No focal deficits. Normal DTRs  ASSESSMENT:  #1. Constipation predominant IBS. Ongoing #2. Normal colonoscopy 2010   PLAN:  #1. Continue linzess as prescribed by your PCP as this helps per your report #2. Advised to reinitiate MiraLAX. Told to start with one half dose per day. Thereafter may adjust up or down to achieve desired result. I suspect she may find a dosage which is helpful #3. Routine repeat screening colonoscopy due around 2020. She is aware. Interval follow-up as needed  A copy of this consultation note has been sent to Dr. cousins and Dr. Dagmar Hait

## 2016-11-06 ENCOUNTER — Ambulatory Visit (HOSPITAL_BASED_OUTPATIENT_CLINIC_OR_DEPARTMENT_OTHER): Payer: BLUE CROSS/BLUE SHIELD | Admitting: Hematology and Oncology

## 2016-11-06 ENCOUNTER — Encounter: Payer: Self-pay | Admitting: Hematology and Oncology

## 2016-11-06 ENCOUNTER — Encounter: Payer: Self-pay | Admitting: *Deleted

## 2016-11-06 DIAGNOSIS — G3184 Mild cognitive impairment, so stated: Secondary | ICD-10-CM

## 2016-11-06 DIAGNOSIS — N951 Menopausal and female climacteric states: Secondary | ICD-10-CM | POA: Diagnosis not present

## 2016-11-06 DIAGNOSIS — M8580 Other specified disorders of bone density and structure, unspecified site: Secondary | ICD-10-CM | POA: Diagnosis not present

## 2016-11-06 DIAGNOSIS — C773 Secondary and unspecified malignant neoplasm of axilla and upper limb lymph nodes: Secondary | ICD-10-CM | POA: Diagnosis not present

## 2016-11-06 DIAGNOSIS — C50411 Malignant neoplasm of upper-outer quadrant of right female breast: Secondary | ICD-10-CM | POA: Diagnosis not present

## 2016-11-06 DIAGNOSIS — R5383 Other fatigue: Secondary | ICD-10-CM

## 2016-11-06 DIAGNOSIS — M779 Enthesopathy, unspecified: Secondary | ICD-10-CM | POA: Diagnosis not present

## 2016-11-06 DIAGNOSIS — Z170 Estrogen receptor positive status [ER+]: Secondary | ICD-10-CM | POA: Diagnosis not present

## 2016-11-06 MED ORDER — EXEMESTANE 25 MG PO TABS: 25.0000 mg | ORAL_TABLET | Freq: Every day | ORAL | 3 refills | Status: DC

## 2016-11-06 NOTE — Assessment & Plan Note (Addendum)
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  Current treatment: anastrozole 1 mg daily 5 years started 02/16/2015, Switched to letrozole, switched to exemestane on 02/16/2016 (due to severe hot flashes myalgias)  Exemestane toxicities: 1.Mild to moderate tendinitis in the left wrist 2. intermittent hot flashes 3. Fatigue but able to manage her job and activities.  Bone density test done at [facility] revealed a T score of -2: Osteopenia: I encouraged her to take calcium and vitamin D and weightbearing exercises.  Cognitive dysfunction: She was not candidate for REMEMBER clinical trial because of potential drug interactions between metoprolol and Zoloft with the study drug.   Return to clinic in 1 year for follow-up

## 2016-11-06 NOTE — Progress Notes (Signed)
Patient Care Team: [name], MD as PCP - General (Internal Medicine) [name], MD as Consulting Physician (General Surgery) [name], MD as Consulting Physician (Hematology and Oncology) [name], MD (Inactive) as Consulting Physician (Radiation Oncology) [name], MD as Consulting Physician (Obstetrics and Gynecology) [name], NP as Nurse Practitioner (Hematology and Oncology)  DIAGNOSIS:  Encounter Diagnosis  Name Primary?  . Malignant neoplasm of upper-outer quadrant of right breast in female, estrogen receptor positive ([place])     SUMMARY OF ONCOLOGIC HISTORY:   Breast cancer of upper-outer quadrant of right female breast ([place])   03/10/2014 Mammogram    Right breast: mass      03/10/2014 Breast US    Right breast: 1 cm irregular mass, UOQ, anterior depth, hypoechoic with posterior acoustic shadowing.  LN with focal cortex thickening in right axilla.      03/12/2014 Initial Biopsy    Right breast core needle bx: Invasive ductal carcinoma with DCIS, right axillary lymph node biopsy negative, ER+ (100%), PR+ (84%), Ki-67 16%, HER-2 negative (ratio 1.22)      03/20/2014 Breast MRI    Right breast UOQ: 2.6 x 1.9 x 1.7 cm invasive ductal carcinoma with extension and involvement of the adjacent skin and nipple area complex, no abnormal lymph nodes      03/21/2014 Clinical Stage    Stage IA (T1b, N0, cM0)      04/20/2014 Definitive Surgery    Right breast lumpectomy/SLNB([name]): IDC grade 2; 2.5 cm with intermediate to high-grade DCIS margins negative: 1/3 positive sentinel lymph node with extracapsular extension, ER 100%, PR 84%, HER-2 negative ratio 1.22, Ki-67 16%      04/20/2014 Pathologic Stage     Stage IIB: pT2, pN1a, cM0       06/04/2014 - 10/22/2014 Adjuvant Chemotherapy    Dose dense doxorubicin and cyclophosphamide x 4 cycles followed by paclitaxel x 1 then nab-paclitaxel x 11 (total 12 cycles)      11/23/2014 - 01/07/2015 Radiation Therapy    Adjuvant RT [name]): Right breast / 45 Gray over 25 fractions; Right breast boost / 16 Gray over 8 fractions      02/17/2015 -  Anti-estrogen oral therapy    Anastrozole 1 mg daily. Switched to letrozole and then switched to exemestane 02/16/2016      04/01/2015 Survivorship    Survivorship care plan completed and given to patient.       CHIEF COMPLIANT: Follow-up on exemestane therapy  INTERVAL HISTORY: Mckenzie Sanford is a 61 year old with above-mentioned history of right breast cancer treated with lumpectomy followed by adjuvant chemotherapy and radiation and is currently on antiestrogen therapy. She initially she was on anastrozole which was switched to letrozole and now she is on exemestane. She appears to be tolerating it moderately moderately well. She continues to have muscle and joint pains which are very severe. She had trigger finger surgery as well as the pain in her wrists and elbows. She is planning on breast reconstruction.  REVIEW OF SYSTEMS:   Constitutional: Denies fevers, chills or abnormal weight loss Eyes: Denies blurriness of vision Ears, nose, mouth, throat, and face: Denies mucositis or sore throat Respiratory: Denies cough, dyspnea or wheezes Cardiovascular: Denies palpitation, chest discomfort Gastrointestinal:  Denies nausea, heartburn or change in bowel habits Skin: Denies abnormal skin rashes Lymphatics: Denies new lymphadenopathy or easy bruising Neurological:Denies numbness, tingling or new weaknesses Behavioral/Psych: Mood is stable, no new changes  Extremities: No lower extremity edema Breast:  denies any pain  or lumps or nodules in either breasts All other systems were reviewed with the patient and are negative.  I have reviewed the past medical history, past surgical history, social history and family history with the patient and they are unchanged from previous note.  ALLERGIES:  is allergic to  shellfish allergy; ivp dye [iodinated diagnostic agents]; and penicillins.  MEDICATIONS:  Current Outpatient Prescriptions  Medication Sig Dispense Refill  . amLODipine (NORVASC) 10 MG tablet Take 5 mg by mouth at bedtime.     Marland Kitchen atorvastatin (LIPITOR) 20 MG tablet Take 20 mg by mouth daily.    . Calcium Carb-Cholecalciferol (LIQUID CALCIUM WITH D3) [phone] MG-UNIT CAPS Take 600 mg by mouth. Calcium 600 mg with 400 iu of D3    . diphenhydrAMINE (BENADRYL) 25 mg capsule Take 25 mg by mouth every 6 (six) hours as needed.    Marland Kitchen exemestane (AROMASIN) 25 MG tablet Take 1 tablet (25 mg total) by mouth daily after breakfast. 90 tablet 3  . irbesartan (AVAPRO) 150 MG tablet Take 150 mg by mouth 2 (two) times daily.     Marland Kitchen LINZESS 290 MCG CAPS capsule Take 290 mcg by mouth daily as needed.  11  . metaxalone (SKELAXIN) 800 MG tablet TAKE 1/2 TO 1 TABLET EVERY 8 HOURS AS NEEDED FOR MUSCLE RELAXATION  2  . metoprolol succinate (TOPROL-XL) 50 MG 24 hr tablet Take 50 mg by mouth every morning. Take with or immediately following a meal.    . Polyethyl Glycol-Propyl Glycol (SYSTANE OP) Apply 1 drop to eye daily as needed (Dry eyes).    . sertraline (ZOLOFT) 25 MG tablet Take 50 mg by mouth daily.   11  . traMADol (ULTRAM) 50 MG tablet Take 1 tablet (50 mg total) by mouth every 12 (twelve) hours as needed. 60 tablet 0   No current facility-administered medications for this visit.     PHYSICAL EXAMINATION: ECOG PERFORMANCE STATUS: 1 - Symptomatic but completely ambulatory  Vitals:   11/06/16 1154  BP: (!) 150/72  Pulse: 64  Resp: 18  Temp: 98.4 F (36.9 C)   Filed Weights   11/06/16 1154  Weight: 131 lb 12.8 oz (59.8 kg)    GENERAL:alert, no distress and comfortable SKIN: skin color, texture, turgor are normal, no rashes or significant lesions EYES: normal, Conjunctiva are pink and non-injected, sclera clear OROPHARYNX:no exudate, no erythema and lips, buccal mucosa, and tongue normal  NECK:  supple, thyroid normal size, non-tender, without nodularity LYMPH:  no palpable lymphadenopathy in the cervical, axillary or inguinal LUNGS: clear to auscultation and percussion with normal breathing effort HEART: regular rate & rhythm and no murmurs and no lower extremity edema ABDOMEN:abdomen soft, non-tender and normal bowel sounds MUSCULOSKELETAL:no cyanosis of digits and no clubbing  NEURO: alert & oriented x 3 with fluent speech, no focal motor/sensory deficits EXTREMITIES: No lower extremity edema  LABORATORY DATA:  I have reviewed the data as listed   Chemistry      Component Value Date/Time   NA 142 02/17/2015 1453   K 3.6 02/17/2015 1453   CL 105 09/10/2014 0909   CO2 29 02/17/2015 1453   BUN 15.7 02/17/2015 1453   CREATININE 0.7 02/17/2015 1453      Component Value Date/Time   CALCIUM 10.0 02/17/2015 1453   ALKPHOS 151 (H) 02/17/2015 1453   AST 28 02/17/2015 1453   ALT 38 02/17/2015 1453   BILITOT <0.30 02/17/2015 1453       Lab Results  Component Value  Date   WBC 4.6 02/17/2015   HGB 13.5 02/17/2015   HCT 39.3 02/17/2015   MCV 88.7 02/17/2015   PLT 277 02/17/2015   NEUTROABS 2.8 02/17/2015    ASSESSMENT & PLAN:  Breast cancer of upper-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  Current treatment: anastrozole 1 mg daily 5 years started 02/16/2015, Switched to letrozole, switched to exemestane on 02/16/2016 (due to severe hot flashes myalgias)  Exemestane toxicities: 1.Mild to moderate tendinitis in the left wrist 2. intermittent  hot flashes 3. Fatigue but able to manage her job and activities.  Bone density test done at [facility] revealed a T score of -2: Osteopenia: I encouraged her to take calcium and vitamin D and weightbearing exercises.  Cognitive dysfunction: She was not candidate for REMEMBER clinical trial because of potential drug interactions between metoprolol and Zoloft with the study drug.   Return to clinic in 1 year for follow-up   I spent 25 minutes talking to the patient of which more than half was spent in counseling and coordination of care.  No orders of the defined types were placed in this encounter.  The patient has a good understanding of the overall plan. she agrees with it. she will call with any problems that may develop before the next visit here.   Rulon Eisenmenger, MD 11/06/16

## 2016-11-13 ENCOUNTER — Ambulatory Visit: Payer: Self-pay | Admitting: Hematology and Oncology

## 2017-01-02 ENCOUNTER — Other Ambulatory Visit: Payer: Self-pay | Admitting: Internal Medicine

## 2017-01-02 DIAGNOSIS — M5416 Radiculopathy, lumbar region: Secondary | ICD-10-CM

## 2017-02-11 ENCOUNTER — Other Ambulatory Visit: Payer: BLUE CROSS/BLUE SHIELD

## 2017-02-18 ENCOUNTER — Other Ambulatory Visit: Payer: Self-pay | Admitting: Hematology and Oncology

## 2017-03-02 ENCOUNTER — Ambulatory Visit
Admission: RE | Admit: 2017-03-02 | Discharge: 2017-03-02 | Disposition: A | Discharge status: Home / Self Care | Payer: BLUE CROSS/BLUE SHIELD | Source: Ambulatory Visit | Attending: Internal Medicine | Admitting: Internal Medicine

## 2017-03-02 DIAGNOSIS — M5416 Radiculopathy, lumbar region: Secondary | ICD-10-CM

## 2017-04-02 ENCOUNTER — Telehealth: Payer: Self-pay | Admitting: Hematology and Oncology

## 2017-04-02 NOTE — Telephone Encounter (Signed)
Faxed office note and lab to [facility] [place]  °

## 2017-04-05 ENCOUNTER — Other Ambulatory Visit: Payer: Self-pay | Admitting: Plastic Surgery

## 2017-05-08 ENCOUNTER — Ambulatory Visit: Payer: BLUE CROSS/BLUE SHIELD | Admitting: Hematology and Oncology

## 2017-05-08 NOTE — Assessment & Plan Note (Deleted)
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  Current treatment: anastrozole 1 mg daily 5 years started 02/16/2015, Switched to letrozole, switched to exemestane on 02/16/2016 (due to severe hot flashes myalgias)  Exemestane toxicities: 1.Mild to moderate tendinitis in the left wrist 2. intermittent hot flashes 3. Fatigue but able to manage her job and activities.  Bone density test done at [facility] revealed a T score of -2: Osteopenia: I encouraged her to take calcium and vitamin D and weightbearing exercises.  Cognitive dysfunction: She was not candidate for REMEMBER clinical trial because of potential drug interactions between metoprolol and Zoloft with the study drug.   Breast cancer surveillance: 1. Breast exam 05/08/2017: Benign 2. Mammogram:  Return to clinic in 1 year for follow-up

## 2017-05-08 NOTE — Progress Notes (Deleted)
Patient Care Team: [name], MD as PCP - General (Internal Medicine) [name], MD as Consulting Physician (General Surgery) [name], MD as Consulting Physician (Hematology and Oncology) [name], MD (Inactive) as Consulting Physician (Radiation Oncology) [name], MD as Consulting Physician (Obstetrics and Gynecology) [name], NP as Nurse Practitioner (Hematology and Oncology)  DIAGNOSIS:  Encounter Diagnosis  Name Primary?  . Malignant neoplasm of upper-outer quadrant of right breast in female, estrogen receptor positive ([place])     SUMMARY OF ONCOLOGIC HISTORY:   Breast cancer of upper-outer quadrant of right female breast ([place])   03/10/2014 Mammogram    Right breast: mass      03/10/2014 Breast US    Right breast: 1 cm irregular mass, UOQ, anterior depth, hypoechoic with posterior acoustic shadowing.  LN with focal cortex thickening in right axilla.      03/12/2014 Initial Biopsy    Right breast core needle bx: Invasive ductal carcinoma with DCIS, right axillary lymph node biopsy negative, ER+ (100%), PR+ (84%), Ki-67 16%, HER-2 negative (ratio 1.22)      03/20/2014 Breast MRI    Right breast UOQ: 2.6 x 1.9 x 1.7 cm invasive ductal carcinoma with extension and involvement of the adjacent skin and nipple area complex, no abnormal lymph nodes      03/21/2014 Clinical Stage    Stage IA (T1b, N0, cM0)      04/20/2014 Definitive Surgery    Right breast lumpectomy/SLNB([name]): IDC grade 2; 2.5 cm with intermediate to high-grade DCIS margins negative: 1/3 positive sentinel lymph node with extracapsular extension, ER 100%, PR 84%, HER-2 negative ratio 1.22, Ki-67 16%      04/20/2014 Pathologic Stage     Stage IIB: pT2, pN1a, cM0       06/04/2014 - 10/22/2014 Adjuvant Chemotherapy    Dose dense doxorubicin and cyclophosphamide x 4 cycles followed by paclitaxel x 1 then nab-paclitaxel x 11 (total 12 cycles)      11/23/2014 - 01/07/2015 Radiation Therapy    Adjuvant RT [name]): Right breast / 45 Gray over 25 fractions; Right breast boost / 16 Gray over 8 fractions      02/17/2015 -  Anti-estrogen oral therapy    Anastrozole 1 mg daily. Switched to letrozole and then switched to exemestane 02/16/2016      04/01/2015 Survivorship    Survivorship care plan completed and given to patient.       CHIEF COMPLIANT: Annual follow-up of breast cancer on exemestane therapy  INTERVAL HISTORY: Mckenzie Sanford is a 61 year old with above-mentioned history of right breast cancer treated with lumpectomy followed by adjuvant chemotherapy and radiation and is currently on antiestrogen therapy with exemestane.  If he continues to have hot flashes but is able to manage.  She does have fatigue as well.  She stays very busy and active.  REVIEW OF SYSTEMS:   Constitutional: Denies fevers, chills or abnormal weight loss Eyes: Denies blurriness of vision Ears, nose, mouth, throat, and face: Denies mucositis or sore throat Respiratory: Denies cough, dyspnea or wheezes Cardiovascular: Denies palpitation, chest discomfort Gastrointestinal:  Denies nausea, heartburn or change in bowel habits Skin: Denies abnormal skin rashes Lymphatics: Denies new lymphadenopathy or easy bruising Neurological:Denies numbness, tingling or new weaknesses Behavioral/Psych: Mood is stable, no new changes  Extremities: No lower extremity edema Breast:  denies any pain or lumps or nodules in either breasts All other systems were reviewed with the patient and are negative.  I have reviewed the past medical history,  past surgical history, social history and family history with the patient and they are unchanged from previous note.  ALLERGIES:  is allergic to shellfish allergy; ivp dye [iodinated diagnostic agents]; and penicillins.  MEDICATIONS:  Current Outpatient Medications  Medication Sig Dispense Refill  . amLODipine (NORVASC) 10  MG tablet Take 5 mg by mouth at bedtime.     Marland Kitchen atorvastatin (LIPITOR) 20 MG tablet Take 20 mg by mouth daily.    . Calcium Carb-Cholecalciferol (LIQUID CALCIUM WITH D3) [phone] MG-UNIT CAPS Take 600 mg by mouth. Calcium 600 mg with 400 iu of D3    . diphenhydrAMINE (BENADRYL) 25 mg capsule Take 25 mg by mouth every 6 (six) hours as needed.    Marland Kitchen exemestane (AROMASIN) 25 MG tablet Take 1 tablet (25 mg total) by mouth daily after breakfast. 90 tablet 3  . exemestane (AROMASIN) 25 MG tablet TAKE 1 TABLET BY MOUTH DAILY AFTER BREAKFAST 90 tablet 3  . irbesartan (AVAPRO) 150 MG tablet Take 150 mg by mouth 2 (two) times daily.     Marland Kitchen LINZESS 290 MCG CAPS capsule Take 290 mcg by mouth daily as needed.  11  . metaxalone (SKELAXIN) 800 MG tablet TAKE 1/2 TO 1 TABLET EVERY 8 HOURS AS NEEDED FOR MUSCLE RELAXATION  2  . metoprolol succinate (TOPROL-XL) 50 MG 24 hr tablet Take 50 mg by mouth every morning. Take with or immediately following a meal.    . Polyethyl Glycol-Propyl Glycol (SYSTANE OP) Apply 1 drop to eye daily as needed (Dry eyes).    . sertraline (ZOLOFT) 25 MG tablet Take 50 mg by mouth daily.   11  . traMADol (ULTRAM) 50 MG tablet Take 1 tablet (50 mg total) by mouth every 12 (twelve) hours as needed. 60 tablet 0   No current facility-administered medications for this visit.     PHYSICAL EXAMINATION: ECOG PERFORMANCE STATUS: 1 - Symptomatic but completely ambulatory  There were no vitals filed for this visit. There were no vitals filed for this visit.  GENERAL:alert, no distress and comfortable SKIN: skin color, texture, turgor are normal, no rashes or significant lesions EYES: normal, Conjunctiva are pink and non-injected, sclera clear OROPHARYNX:no exudate, no erythema and lips, buccal mucosa, and tongue normal  NECK: supple, thyroid normal size, non-tender, without nodularity LYMPH:  no palpable lymphadenopathy in the cervical, axillary or inguinal LUNGS: clear to auscultation and  percussion with normal breathing effort HEART: regular rate & rhythm and no murmurs and no lower extremity edema ABDOMEN:abdomen soft, non-tender and normal bowel sounds MUSCULOSKELETAL:no cyanosis of digits and no clubbing  NEURO: alert & oriented x 3 with fluent speech, no focal motor/sensory deficits EXTREMITIES: No lower extremity edema BREAST: No palpable masses or nodules in either right or left breasts. No palpable axillary supraclavicular or infraclavicular adenopathy no breast tenderness or nipple discharge. (exam performed in the presence of a chaperone)  LABORATORY DATA:  I have reviewed the data as listed   Chemistry      Component Value Date/Time   NA 142 02/17/2015 1453   K 3.6 02/17/2015 1453   CL 105 09/10/2014 0909   CO2 29 02/17/2015 1453   BUN 15.7 02/17/2015 1453   CREATININE 0.7 02/17/2015 1453      Component Value Date/Time   CALCIUM 10.0 02/17/2015 1453   ALKPHOS 151 (H) 02/17/2015 1453   AST 28 02/17/2015 1453   ALT 38 02/17/2015 1453   BILITOT <0.30 02/17/2015 1453       Lab Results  Component Value Date   WBC 4.6 02/17/2015   HGB 13.5 02/17/2015   HCT 39.3 02/17/2015   MCV 88.7 02/17/2015   PLT 277 02/17/2015   NEUTROABS 2.8 02/17/2015    ASSESSMENT & PLAN:  Breast cancer of upper-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  Current treatment: anastrozole 1 mg daily 5 years started 02/16/2015, Switched to letrozole, switched to exemestane on 02/16/2016 (due to severe hot flashes myalgias)  Exemestane toxicities: 1.Mild  to moderate tendinitis in the left wrist 2. intermittent hot flashes 3. Fatigue but able to manage her job and activities.  Bone density test done at [facility] revealed a T score of -2: Osteopenia: I encouraged her to take calcium and vitamin D and weightbearing exercises.  Cognitive dysfunction: She was not candidate for REMEMBER clinical trial because of potential drug interactions between metoprolol and Zoloft with the study drug.   Breast cancer surveillance: 1. Breast exam 05/08/2017: Benign 2. Mammogram:  Return to clinic in 1 year for follow-up   I spent 25 minutes talking to the patient of which more than half was spent in counseling and coordination of care.  No orders of the defined types were placed in this encounter.  The patient has a good understanding of the overall plan. she agrees with it. she will call with any problems that may develop before the next visit here.   Rulon Eisenmenger, MD 05/08/17

## 2017-05-14 NOTE — Assessment & Plan Note (Signed)
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  Current treatment: anastrozole 1 mg daily 5 years started 02/16/2015, Switched to letrozole, switched to exemestane on 02/16/2016 (due to severe hot flashes myalgias)  Exemestanetoxicities: 1.Mild to moderate tendinitis in the left wrist 2. intermittent hot flashes 3. Fatigue but able to manage her job and activities.  Bone density test done at [facility] revealed a T score of -2: Osteopenia: I encouraged her to take calcium and vitamin D and weightbearing exercises.   Return to clinic in 1 year for follow-up

## 2017-05-15 ENCOUNTER — Telehealth: Payer: Self-pay | Admitting: Hematology and Oncology

## 2017-05-15 ENCOUNTER — Ambulatory Visit: Payer: BLUE CROSS/BLUE SHIELD | Admitting: Hematology and Oncology

## 2017-05-15 DIAGNOSIS — M8580 Other specified disorders of bone density and structure, unspecified site: Secondary | ICD-10-CM | POA: Diagnosis not present

## 2017-05-15 DIAGNOSIS — C773 Secondary and unspecified malignant neoplasm of axilla and upper limb lymph nodes: Secondary | ICD-10-CM | POA: Diagnosis not present

## 2017-05-15 DIAGNOSIS — C50411 Malignant neoplasm of upper-outer quadrant of right female breast: Secondary | ICD-10-CM

## 2017-05-15 DIAGNOSIS — R5383 Other fatigue: Secondary | ICD-10-CM

## 2017-05-15 DIAGNOSIS — Z170 Estrogen receptor positive status [ER+]: Secondary | ICD-10-CM | POA: Diagnosis not present

## 2017-05-15 DIAGNOSIS — G4700 Insomnia, unspecified: Secondary | ICD-10-CM | POA: Diagnosis not present

## 2017-05-15 MED ORDER — COD LIVER OIL 5000-500 UNIT/5ML PO OIL: 5.0000 mL | TOPICAL_OIL | Freq: Every day | ORAL | 0 refills | Status: DC

## 2017-05-15 MED ORDER — SERTRALINE HCL 50 MG PO TABS: 50.0000 mg | ORAL_TABLET | Freq: Every day | ORAL | Status: AC

## 2017-05-15 MED ORDER — VITAMIN D 1000 UNITS PO TABS: 1000.0000 [IU] | ORAL_TABLET | Freq: Every day | ORAL | Status: DC

## 2017-05-15 MED ORDER — VITAMIN C 250 MG PO TABS: 250.0000 mg | ORAL_TABLET | Freq: Every day | ORAL | Status: DC

## 2017-05-15 MED ORDER — METHOCARBAMOL 500 MG PO TABS: 500.0000 mg | ORAL_TABLET | Freq: Four times a day (QID) | ORAL | Status: DC

## 2017-05-15 NOTE — Progress Notes (Signed)
Patient Care Team: [name], MD as PCP - General (Internal Medicine) [name], MD as Consulting Physician (General Surgery) [name], MD as Consulting Physician (Hematology and Oncology) [name], MD (Inactive) as Consulting Physician (Radiation Oncology) [name], MD as Consulting Physician (Obstetrics and Gynecology) [name], NP as Nurse Practitioner (Hematology and Oncology)  DIAGNOSIS:  Encounter Diagnosis  Name Primary?  . Malignant neoplasm of upper-outer quadrant of right breast in female, estrogen receptor positive ([name])     SUMMARY OF ONCOLOGIC HISTORY:   Breast cancer of upper-outer quadrant of right female breast ([name])   03/10/2014 Mammogram    Right breast: mass      03/10/2014 Breast US    Right breast: 1 cm irregular mass, UOQ, anterior depth, hypoechoic with posterior acoustic shadowing.  LN with focal cortex thickening in right axilla.      03/12/2014 Initial Biopsy    Right breast core needle bx: Invasive ductal carcinoma with DCIS, right axillary lymph node biopsy negative, ER+ (100%), PR+ (84%), Ki-67 16%, HER-2 negative (ratio 1.22)      03/20/2014 Breast MRI    Right breast UOQ: 2.6 x 1.9 x 1.7 cm invasive ductal carcinoma with extension and involvement of the adjacent skin and nipple area complex, no abnormal lymph nodes      03/21/2014 Clinical Stage    Stage IA (T1b, N0, cM0)      04/20/2014 Definitive Surgery    Right breast lumpectomy/SLNB([name]): IDC grade 2; 2.5 cm with intermediate to high-grade DCIS margins negative: 1/3 positive sentinel lymph node with extracapsular extension, ER 100%, PR 84%, HER-2 negative ratio 1.22, Ki-67 16%      04/20/2014 Pathologic Stage     Stage IIB: pT2, pN1a, cM0       06/04/2014 - 10/22/2014 Adjuvant Chemotherapy    Dose dense doxorubicin and cyclophosphamide x 4 cycles followed by paclitaxel x 1 then nab-paclitaxel x 11 (total 12 cycles)      11/23/2014 - 01/07/2015 Radiation Therapy    Adjuvant RT [name]): Right breast / 45 Gray over 25 fractions; Right breast boost / 16 Gray over 8 fractions      02/17/2015 -  Anti-estrogen oral therapy    Anastrozole 1 mg daily. Switched to letrozole and then switched to exemestane 02/16/2016      04/01/2015 Survivorship    Survivorship care plan completed and given to patient.       CHIEF COMPLIANT: Follow-up on exemestane  INTERVAL HISTORY: Mckenzie Sanford is a 61 year old with above-mentioned history of right breast cancer who is currently on exemestane and appears to be tolerating it much better.  She continues to have fatigue.  She denies any lumps or nodules.  In November she had a breast reduction surgery and that went very well.  REVIEW OF SYSTEMS:   Constitutional: Denies fevers, chills or abnormal weight loss Eyes: Denies blurriness of vision Ears, nose, mouth, throat, and face: Denies mucositis or sore throat Respiratory: Denies cough, dyspnea or wheezes Cardiovascular: Denies palpitation, chest discomfort Gastrointestinal:  Denies nausea, heartburn or change in bowel habits Skin: Denies abnormal skin rashes Lymphatics: Denies new lymphadenopathy or easy bruising Neurological:Denies numbness, tingling or new weaknesses Behavioral/Psych: Mood is stable, no new changes  Extremities: No lower extremity edema Breast:  denies any pain or lumps or nodules in either breasts All other systems were reviewed with the patient and are negative.  I have reviewed the past medical history, past surgical history, social history and family history with  the patient and they are unchanged from previous note.  ALLERGIES:  is allergic to shellfish allergy; ivp dye [iodinated diagnostic agents]; and penicillins.  MEDICATIONS:  Current Outpatient Medications  Medication Sig Dispense Refill  . amLODipine (NORVASC) 10 MG tablet Take 5 mg by mouth at bedtime.     Marland Kitchen atorvastatin (LIPITOR) 20  MG tablet Take 20 mg by mouth daily.    . cholecalciferol (VITAMIN D) 1000 units tablet Take 1 tablet (1,000 Units total) by mouth daily.    . Cod Liver Oil 5000-500 UNIT/5ML OIL Take 5 mLs by mouth daily.  0  . diphenhydrAMINE (BENADRYL) 25 mg capsule Take 25 mg by mouth every 6 (six) hours as needed.    Marland Kitchen exemestane (AROMASIN) 25 MG tablet TAKE 1 TABLET BY MOUTH DAILY AFTER BREAKFAST 90 tablet 3  . irbesartan (AVAPRO) 150 MG tablet Take 150 mg by mouth 2 (two) times daily.     Marland Kitchen LINZESS 290 MCG CAPS capsule Take 290 mcg by mouth daily as needed.  11  . methocarbamol (ROBAXIN) 500 MG tablet Take 1 tablet (500 mg total) by mouth 4 (four) times daily.    . metoprolol succinate (TOPROL-XL) 50 MG 24 hr tablet Take 50 mg by mouth every morning. Take with or immediately following a meal.    . Polyethyl Glycol-Propyl Glycol (SYSTANE OP) Apply 1 drop to eye daily as needed (Dry eyes).    . sertraline (ZOLOFT) 50 MG tablet Take 1 tablet (50 mg total) by mouth daily.    . vitamin C (ASCORBIC ACID) 250 MG tablet Take 1 tablet (250 mg total) by mouth daily.     No current facility-administered medications for this visit.     PHYSICAL EXAMINATION: ECOG PERFORMANCE STATUS: 1 - Symptomatic but completely ambulatory  Vitals:   05/15/17 1005  BP: (!) 173/96  Pulse: 63  Resp: 18  Temp: 98.4 F (36.9 C)  SpO2: 99%   Filed Weights   05/15/17 1005  Weight: 130 lb 14.4 oz (59.4 kg)    GENERAL:alert, no distress and comfortable SKIN: skin color, texture, turgor are normal, no rashes or significant lesions EYES: normal, Conjunctiva are pink and non-injected, sclera clear OROPHARYNX:no exudate, no erythema and lips, buccal mucosa, and tongue normal  NECK: supple, thyroid normal size, non-tender, without nodularity LYMPH:  no palpable lymphadenopathy in the cervical, axillary or inguinal LUNGS: clear to auscultation and percussion with normal breathing effort HEART: regular rate & rhythm and no  murmurs and no lower extremity edema ABDOMEN:abdomen soft, non-tender and normal bowel sounds MUSCULOSKELETAL:no cyanosis of digits and no clubbing  NEURO: alert & oriented x 3 with fluent speech, no focal motor/sensory deficits EXTREMITIES: No lower extremity edema   LABORATORY DATA:  I have reviewed the data as listed   Chemistry      Component Value Date/Time   NA 142 02/17/2015 1453   K 3.6 02/17/2015 1453   CL 105 09/10/2014 0909   CO2 29 02/17/2015 1453   BUN 15.7 02/17/2015 1453   CREATININE 0.7 02/17/2015 1453      Component Value Date/Time   CALCIUM 10.0 02/17/2015 1453   ALKPHOS 151 (H) 02/17/2015 1453   AST 28 02/17/2015 1453   ALT 38 02/17/2015 1453   BILITOT <0.30 02/17/2015 1453       Lab Results  Component Value Date   WBC 4.6 02/17/2015   HGB 13.5 02/17/2015   HCT 39.3 02/17/2015   MCV 88.7 02/17/2015   PLT 277 02/17/2015  NEUTROABS 2.8 02/17/2015    ASSESSMENT & PLAN:  Breast cancer of upper-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 Breast reduction surgery November 2018: Benign  Current treatment: anastrozole 1 mg daily 5 years started 02/16/2015, Switched to letrozole, switched to exemestane on 02/16/2016 (due to severe hot flashes myalgias)  Exemestanetoxicities: 1.Mild to moderate tendinitis in the left wrist: Status post injection 2. intermittent hot flashes 3. Fatigue but able to manage her job and activities. She appears to be tolerating exemestane significantly better.  Insomnia issues: I recommended guided  imagery for her. Bone density test done at [facility] revealed a T score of -2: Osteopenia: I encouraged her to take calcium and vitamin D and weightbearing exercises.   Return to clinic in 1 year for follow-up  I spent 25 minutes talking to the patient of which more than half was spent in counseling and coordination of care.  No orders of the defined types were placed in this encounter.  The patient has a good understanding of the overall plan. she agrees with it. she will call with any problems that may develop before the next visit here.   Rulon Eisenmenger, MD 05/15/17

## 2017-05-15 NOTE — Telephone Encounter (Signed)
Gave patient avs and calendar with appts per 12/18 los.  °

## 2017-05-25 ENCOUNTER — Encounter: Payer: Self-pay | Admitting: General Practice

## 2017-05-25 NOTE — Progress Notes (Signed)
[place] Spiritual Care Note  Referred by breast navigator Dawn Stuart/RN re pt's question about guided imagery and meditation. LVM encouraging call back.   [place], [place], [facility] Pager [phone] Voicemail [phone]

## 2017-08-01 ENCOUNTER — Encounter: Payer: Self-pay | Admitting: Physical Therapy

## 2017-08-01 ENCOUNTER — Ambulatory Visit: Payer: BLUE CROSS/BLUE SHIELD | Attending: Neurosurgery | Admitting: Physical Therapy

## 2017-08-01 DIAGNOSIS — M545 Low back pain: Secondary | ICD-10-CM | POA: Diagnosis not present

## 2017-08-01 DIAGNOSIS — G8929 Other chronic pain: Secondary | ICD-10-CM

## 2017-08-01 DIAGNOSIS — M2560 Stiffness of unspecified joint, not elsewhere classified: Secondary | ICD-10-CM | POA: Insufficient documentation

## 2017-08-01 NOTE — Therapy (Signed)
[place], [place], [address] Phone: [phone]   Fax:  [phone]  Physical Therapy Evaluation  Patient Details  Name: Mckenzie Sanford MRN: [id number] Date of Birth: Dec 05, 1955 Referring Provider: Dr. Newman Pies   Encounter Date: 08/01/2017  PT End of Session - 08/01/17 0843    Visit Number  1    Number of Visits  12    Date for PT Re-Evaluation  09/12/17    PT Start Time  0804    PT Stop Time  4496    PT Time Calculation (min)  50 min    Activity Tolerance  Patient tolerated treatment well    Behavior During Therapy  [facility] for tasks assessed/performed       Past Medical History:  Diagnosis Date  . Allergic rhinitis   . Anxiety   . Arthritis    in back  . Breast cancer ([place]) 03/12/14   right/invasive ductal carcinoma, DCIS  . Depression   . Difficulty sleeping    takes Lorazepam as needed  . HTN (hypertension)   . Hypercholesteremia   . IBS (irritable bowel syndrome)   . Radiation 11/23/14-01/07/15   Right breast  . Wears glasses     Past Surgical History:  Procedure Laterality Date  . BREAST SURGERY  11/15   lumpectomy RT breast  . COLONOSCOPY    . CYST EXCISION     right axilla  . DILATION AND CURETTAGE OF UTERUS    . PORT-A-CATH REMOVAL N/A 03/12/2015   Procedure: REMOVAL PORT-A-CATH;  Surgeon: [name], MD;  Location: [facility];  Service: General;  Laterality: N/A;  . PORTACATH PLACEMENT Right 06/01/2014   Procedure: ULTRASOUND GUIDED INSERTION PORT-A-CATH;  Surgeon: [name], MD;  Location: [place];  Service: General;  Laterality: Right;  . RADIOACTIVE SEED GUIDED PARTIAL MASTECTOMY WITH AXILLARY SENTINEL LYMPH NODE BIOPSY Right 04/20/2014   Procedure: RIGHT BREAST RADIOACTIVE SEED GUIDED LUMPECTOMY WITH RIGHT AXILLARY SENTINEL LYMPH NODE BIOPSY;  Surgeon: [name], MD;  Location: [place];  Service: General;  Laterality:  Right;  . TONSILLECTOMY      There were no vitals filed for this visit.   Subjective Assessment - 08/01/17 0807    Subjective  Pt presents with chronic back pain for approx. 5 yrs.  She has been cancer free for 4 yrs.  Her pain is intermittent.  She has increased pain when she lifts, carries and bends especially during work.  She has some limitations with her ability to sleep, exercise.  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     Pertinent History  Breast cancer, Mastectomy     Limitations  Sitting;Lifting;Standing;House hold activities    How long can you sit comfortably?  15 min     How long can you walk comfortably?  can walk as needed     Diagnostic tests  none     Patient Stated Goals  Pt would like to have back pain relief especially sleeping.      Currently in Pain?  Yes    Pain Score  0-No pain today     Pain Location  Back    Pain Orientation  Right;Lower    Pain Descriptors / Indicators  Tightness    Pain Type  Chronic pain    Pain Radiating Towards  Rt. leg when standing, working     Pain Onset  More than a  month ago    Pain Frequency  Intermittent    Aggravating Factors   working, bending, lifting     Pain Relieving Factors  meds, reposition, relaxing    Effect of Pain on Daily Activities  limits what she can do.          [facility] PT Assessment - 08/01/17 0001      Assessment   Medical Diagnosis  chronic back pain     Referring Provider  Dr. Newman Pies    Onset Date/Surgical Date  -- chronic 5 yrs     Prior Therapy  Yes Cancer Rehab       Precautions   Precautions  Other (comment)    Precaution Comments  Cancer       Restrictions   Weight Bearing Restrictions  No      Balance Screen   Has the patient fallen in the past 6 months  No    Has the patient had a decrease in activity level because of a fear of falling?   No    Is the patient reluctant to leave their home because of a fear of falling?   No      Home  Film[place]video [employer] residence    Living Arrangements  Other relatives    Home Access  Stairs to enter    Entrance Stairs-Number of Steps  3      Prior Function   Level of Independence  Independent    Vocation  Full time employment    Vocation Requirements  childcare    Leisure  walking, puzzles, movies , friendships       Cognition   Overall Cognitive Status  Within Functional Limits for tasks assessed      Observation/Other Assessments   Focus on Therapeutic Outcomes (FOTO)   41%      Sensation   Light Touch  Appears Intact      Squat   Comments  able to do needs cues for form       Single Leg Stance   Comments  can do arms out, unsteady       Posture/Postural Control   Posture/Postural Control  Postural limitations    Postural Limitations  Increased lumbar lordosis;Anterior pelvic tilt      AROM   Lumbar Flexion  touches toes     Lumbar Extension  no pain     Lumbar - Right Side Bend  WFL     Lumbar - Left Side Bend  WFL    Lumbar - Right Rotation  WFL    Lumbar - Left Rotation  [facility]       Strength   Right Hip ABduction  4/5    Left Hip ABduction  4/5    Right Knee Flexion  5/5    Right Knee Extension  5/5    Left Knee Flexion  5/5    Left Knee Extension  5/5      Palpation   Spinal mobility  mild stiffness, no pain              Objective measurements completed on examination: See above findings.   PT Education - 08/01/17 1250    Education provided  Yes    Education Details  PT/POC, HEP, posture, lifting, squatting    Person(s) Educated  Patient    Methods  Explanation;Handout    Comprehension  Verbalized understanding;Returned demonstration          PT Long Term  Goals - 08/01/17 1251      PT LONG TERM GOAL #1   Title  Pt will be I with HEP for low back, core     Time  6    Period  Weeks    Status  New    Target Date  09/12/17      PT LONG TERM GOAL #2   Title  Pt will be able to demo proper body mechanics,  lifting for injury prevention.     Time  6    Period  Weeks    Status  New    Target Date  09/12/17      PT LONG TERM GOAL #3   Title  Pt will be able to sleep without limitation of pain, improved 50% or more.     Time  6    Period  Weeks    Status  New    Target Date  09/12/17      PT LONG TERM GOAL #4   Title  Pt will be able to attend community class for continued back care and fitness.     Time  6    Period  Weeks    Status  New    Target Date  09/12/17      PT LONG TERM GOAL #5   Title  Pt will score less than 35% improved on FOTO to demo improvement in functional mobility.     Time  6    Period  Weeks    Status  New    Target Date  09/12/17             Plan - 08/01/17 1256    Clinical Impression Statement  Pt presents for low complexity eval of low back pain which is chronic, intermittent and mechanical in nature.  She is limited in what she can do especially in her work environment.  She routinely lifts, bends and carries moderate sized items.  She has some stiffness in her back, hips and some mild core weakness.  She should do well with PT intervention with focus on education for lifting, chronic pain and self care strategies.     Clinical Presentation  Stable    Clinical Decision Making  Low    Rehab Potential  Excellent    PT Frequency  2x / week    PT Duration  6 weeks    PT Treatment/Interventions  ADLs/Self Care Home Management;Functional mobility training;Neuromuscular re-education;Moist Heat;Ultrasound;Gait training;Therapeutic activities;Therapeutic exercise;Balance training;Patient/family education;Manual techniques    PT Next Visit Plan  check HEP, work on flexibilty, lower ab progression     PT Home Exercise Plan  post tilt, childs pose, single KTC and double KTC     Consulted and Agree with Plan of Care  Patient       Patient will benefit from skilled therapeutic intervention in order to improve the following deficits and impairments:   Hypomobility, Difficulty walking, Decreased range of motion, Increased fascial restricitons, Impaired flexibility, Pain, Decreased strength, Decreased mobility  Visit Diagnosis: Chronic bilateral low back pain without sciatica     Problem List Patient Active Problem List   Diagnosis Date Noted  . Hypotension 05/18/2015  . Fatigue 05/18/2015  . Nausea with vomiting 07/23/2014  . Dehydration 07/23/2014  . GERD (gastroesophageal reflux disease) 07/23/2014  . Neutropenia ([place]) 07/23/2014  . Breast cancer of upper-outer quadrant of right female breast ([place]) 03/20/2014  . Palpitations 03/26/2013  . Hypertension 07/02/2011  . Hyperlipidemia 07/02/2011  .  CONSTIPATION 01/22/2009  . FLATULENCE-GAS-BLOATING 01/22/2009    PAA,JENNIFER 08/01/2017, 1:26 PM  [facility] [address] [place], [place], [address] Phone: [phone]   Fax:  [phone]  Name: Mckenzie Sanford MRN: [id number] Date of Birth: 05-16-1956    [name], PT 08/01/17 1:26 PM Phone: [phone] Fax: [phone]

## 2017-08-01 NOTE — Patient Instructions (Signed)
Posterior pelvic tilt Knee to chest Double knee to chest  childs pose

## 2017-08-03 ENCOUNTER — Encounter: Payer: Self-pay | Admitting: Physical Therapy

## 2017-08-03 ENCOUNTER — Ambulatory Visit: Payer: BLUE CROSS/BLUE SHIELD | Admitting: Physical Therapy

## 2017-08-03 DIAGNOSIS — G8929 Other chronic pain: Secondary | ICD-10-CM

## 2017-08-03 DIAGNOSIS — M545 Low back pain, unspecified: Secondary | ICD-10-CM

## 2017-08-03 NOTE — Therapy (Signed)
[place], [place], [address] Phone: [phone]   Fax:  [phone]  Physical Therapy Treatment  Patient Details  Name: Mckenzie Sanford MRN: [id number] Date of Birth: 1955/10/31 Referring Provider: Dr. Newman Pies   Encounter Date: 08/03/2017  PT End of Session - 08/03/17 0730    Visit Number  2    Number of Visits  12    Date for PT Re-Evaluation  09/12/17    PT Start Time  0721    PT Stop Time  0800    PT Time Calculation (min)  39 min    Activity Tolerance  Patient tolerated treatment well    Behavior During Therapy  [facility] for tasks assessed/performed       Past Medical History:  Diagnosis Date  . Allergic rhinitis   . Anxiety   . Arthritis    in back  . Breast cancer ([place]) 03/12/14   right/invasive ductal carcinoma, DCIS  . Depression   . Difficulty sleeping    takes Lorazepam as needed  . HTN (hypertension)   . Hypercholesteremia   . IBS (irritable bowel syndrome)   . Radiation 11/23/14-01/07/15   Right breast  . Wears glasses     Past Surgical History:  Procedure Laterality Date  . BREAST SURGERY  11/15   lumpectomy RT breast  . COLONOSCOPY    . CYST EXCISION     right axilla  . DILATION AND CURETTAGE OF UTERUS    . PORT-A-CATH REMOVAL N/A 03/12/2015   Procedure: REMOVAL PORT-A-CATH;  Surgeon: [name], MD;  [facility];  Service: General;  Laterality: N/A;  . PORTACATH PLACEMENT Right 06/01/2014   Procedure: ULTRASOUND GUIDED INSERTION PORT-A-CATH;  Surgeon: [name], MD;  Location: [place];  Service: General;  Laterality: Right;  . RADIOACTIVE SEED GUIDED PARTIAL MASTECTOMY WITH AXILLARY SENTINEL LYMPH NODE BIOPSY Right 04/20/2014   Procedure: RIGHT BREAST RADIOACTIVE SEED GUIDED LUMPECTOMY WITH RIGHT AXILLARY SENTINEL LYMPH NODE BIOPSY;  Surgeon: [name], MD;  Location: [place];  Service: General;  Laterality: Right;   . TONSILLECTOMY      There were no vitals filed for this visit.                   [facility] Adult PT Treatment/Exercise - 08/03/17 0001      Lumbar Exercises: Stretches   Single Knee to Chest Stretch  3 reps;30 seconds    Double Knee to Chest Stretch  3 reps;30 seconds    Pelvic Tilt  10 reps    Quadruped Mid Back Stretch  30 seconds;3 reps    Quadruped Mid Back Stretch Limitations  added laterals 2 x 30 each way       Lumbar Exercises: Supine   Basic Lumbar Stabilization  10 reps    Basic Lumbar Stabilization Limitations  unilatera knee drops and bent knee raisesi              PT Education - 08/03/17 0756    Education provided  Yes    Education Details  HEP    Person(s) Educated  Patient    Methods  Explanation;Handout    Comprehension  Verbalized understanding          PT Long Term Goals - 08/01/17 1251      PT LONG TERM GOAL #1   Title  Pt will be I with HEP for low back, core     Time  6  Period  Weeks    Status  New    Target Date  09/12/17      PT LONG TERM GOAL #2   Title  Pt will be able to demo proper body mechanics, lifting for injury prevention.     Time  6    Period  Weeks    Status  New    Target Date  09/12/17      PT LONG TERM GOAL #3   Title  Pt will be able to sleep without limitation of pain, improved 50% or more.     Time  6    Period  Weeks    Status  New    Target Date  09/12/17      PT LONG TERM GOAL #4   Title  Pt will be able to attend community class for continued back care and fitness.     Time  6    Period  Weeks    Status  New    Target Date  09/12/17      PT LONG TERM GOAL #5   Title  Pt will score less than 35% improved on FOTO to demo improvement in functional mobility.     Time  6    Period  Weeks    Status  New    Target Date  09/12/17            Plan - 08/03/17 1018    Clinical Impression Statement  Began Nustep. Reviewed HEP and progressed lower abdominal core. Updated HEP. No  increased pain post session, felt looser.     PT Next Visit Plan  check HEP, work on flexibilty, lower ab progression     PT Home Exercise Plan  post tilt, childs pose, single KTC and double KTC     Consulted and Agree with Plan of Care  Patient       Patient will benefit from skilled therapeutic intervention in order to improve the following deficits and impairments:  Hypomobility, Difficulty walking, Decreased range of motion, Increased fascial restricitons, Impaired flexibility, Pain, Decreased strength, Decreased mobility  Visit Diagnosis: Chronic bilateral low back pain without sciatica     Problem List Patient Active Problem List   Diagnosis Date Noted  . Hypotension 05/18/2015  . Fatigue 05/18/2015  . Nausea with vomiting 07/23/2014  . Dehydration 07/23/2014  . GERD (gastroesophageal reflux disease) 07/23/2014  . Neutropenia ([place]) 07/23/2014  . Breast cancer of upper-outer quadrant of right female breast ([place]) 03/20/2014  . Palpitations 03/26/2013  . Hypertension 07/02/2011  . Hyperlipidemia 07/02/2011  . CONSTIPATION 01/22/2009  . FLATULENCE-GAS-BLOATING 01/22/2009    Dorene Ar, PTA 08/03/2017, 10:22 AM  [facility] [address] [place], [place], [address] Phone: [phone]   Fax:  [phone]  Name: Mckenzie Sanford MRN: [id number] Date of Birth: 24-May-1956

## 2017-08-08 ENCOUNTER — Ambulatory Visit: Payer: BLUE CROSS/BLUE SHIELD | Admitting: Physical Therapy

## 2017-08-10 ENCOUNTER — Ambulatory Visit: Payer: BLUE CROSS/BLUE SHIELD | Admitting: Physical Therapy

## 2017-08-10 ENCOUNTER — Encounter: Payer: Self-pay | Admitting: Physical Therapy

## 2017-08-10 DIAGNOSIS — M545 Low back pain: Principal | ICD-10-CM

## 2017-08-10 DIAGNOSIS — G8929 Other chronic pain: Secondary | ICD-10-CM

## 2017-08-10 NOTE — Therapy (Signed)
[place], [place], [address] Phone: [phone]   Fax:  [phone]  Physical Therapy Treatment  Patient Details  Name: Mckenzie Sanford MRN: [id number] Date of Birth: Dec 07, 1955 Referring Provider: Dr. Newman Pies   Encounter Date: 08/10/2017  PT End of Session - 08/10/17 0851    Visit Number  3    Number of Visits  12    Date for PT Re-Evaluation  09/12/17    PT Start Time  0845    PT Stop Time  0940    PT Time Calculation (min)  55 min       Past Medical History:  Diagnosis Date  . Allergic rhinitis   . Anxiety   . Arthritis    in back  . Breast cancer ([place]) 03/12/14   right/invasive ductal carcinoma, DCIS  . Depression   . Difficulty sleeping    takes Lorazepam as needed  . HTN (hypertension)   . Hypercholesteremia   . IBS (irritable bowel syndrome)   . Radiation 11/23/14-01/07/15   Right breast  . Wears glasses     Past Surgical History:  Procedure Laterality Date  . BREAST SURGERY  11/15   lumpectomy RT breast  . COLONOSCOPY    . CYST EXCISION     right axilla  . DILATION AND CURETTAGE OF UTERUS    . PORT-A-CATH REMOVAL N/A 03/12/2015   Procedure: REMOVAL PORT-A-CATH;  Surgeon: [name], MD;  [facility];  Service: General;  Laterality: N/A;  . PORTACATH PLACEMENT Right 06/01/2014   Procedure: ULTRASOUND GUIDED INSERTION PORT-A-CATH;  Surgeon: [name], MD;  Location: [place];  Service: General;  Laterality: Right;  . RADIOACTIVE SEED GUIDED PARTIAL MASTECTOMY WITH AXILLARY SENTINEL LYMPH NODE BIOPSY Right 04/20/2014   Procedure: RIGHT BREAST RADIOACTIVE SEED GUIDED LUMPECTOMY WITH RIGHT AXILLARY SENTINEL LYMPH NODE BIOPSY;  Surgeon: [name], MD;  Location: [place];  Service: General;  Laterality: Right;  . TONSILLECTOMY      There were no vitals filed for this visit.  Subjective Assessment - 08/10/17 0849    Subjective  Not hurting that much. Doing some exercises when I can. Sometimes my hands are numb.     Currently in Pain?  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     Lifting  Lifting stool from floor to chest to simulate lifting toddler at work x 5, sit to stands from low chairs like toddler chairs at work x 5       Lumbar Exercises: Stretches   Pelvic Tilt  10 reps    Quadruped Mid Back Stretch  30 seconds;3 reps    Quadruped Mid Back Stretch Limitations  added laterals 2 x 30 each  way       Lumbar Exercises: Seated   Sit to Stand  10 reps      Lumbar Exercises: Supine   Basic Lumbar Stabilization  10 reps    Basic Lumbar Stabilization Limitations  unilatera knee drops and bent knee raisesi- cues needed for stabilization , added heel slides and SLR       Moist Heat Therapy   Number Minutes Moist Heat  15 Minutes    Moist Heat Location  Lumbar Spine                  PT Long Term Goals - 08/01/17 1251      PT LONG TERM GOAL #1   Title  Pt will be I with HEP for low back, core     Time  6    Period  Weeks    Status  New    Target Date  09/12/17      PT LONG TERM GOAL #2   Title  Pt will be able to demo proper body mechanics, lifting for injury prevention.     Time  6    Period  Weeks    Status  New    Target Date  09/12/17      PT LONG TERM GOAL #3   Title  Pt will be able to sleep without limitation of pain,  improved 50% or more.     Time  6    Period  Weeks    Status  New    Target Date  09/12/17      PT LONG TERM GOAL #4   Title  Pt will be able to attend community class for continued back care and fitness.     Time  6    Period  Weeks    Status  New    Target Date  09/12/17      PT LONG TERM GOAL #5   Title  Pt will score less than 35% improved on FOTO to demo improvement in functional mobility.     Time  6    Period  Weeks    Status  New    Target Date  09/12/17            Plan - 08/10/17 1751    Clinical Impression Statement  Continued lumbar stabilization progression. She required cues to maintain neutral spine and engage abdominals. Time spent with Lifting  and bending techniques to prevent pain and injury with job duites. Pt very receptive to concepts.     PT Next Visit Plan  check HEP, work on flexibilty, lower ab progression     PT Home Exercise Plan  post tilt, childs pose, single KTC and double KTC, abdominal draw in with knee drops and bent knee raises.     Consulted and Agree with Plan of Care  Patient       Patient will benefit from skilled therapeutic intervention in order to improve the following deficits and impairments:  Hypomobility, Difficulty walking, Decreased range of motion, Increased fascial restricitons, Impaired flexibility, Pain, Decreased strength, Decreased mobility  Visit Diagnosis: Chronic bilateral low back pain without sciatica     Problem List Patient Active Problem List   Diagnosis Date Noted  . Hypotension 05/18/2015  . Fatigue 05/18/2015  . Nausea with vomiting 07/23/2014  . Dehydration 07/23/2014  . GERD (gastroesophageal reflux disease) 07/23/2014  . Neutropenia ([place]) 07/23/2014  . Breast cancer of upper-outer quadrant of right female breast ([place]) 03/20/2014  .  Palpitations 03/26/2013  . Hypertension 07/02/2011  . Hyperlipidemia 07/02/2011  . CONSTIPATION 01/22/2009  . FLATULENCE-GAS-BLOATING 01/22/2009    Dorene Ar, PTA 08/10/2017, 9:28 AM  [facility] [address] [place], [place], [address] Phone: [phone]   Fax:  [phone]  Name: Mckenzie Sanford MRN: [id number] Date of Birth: 1956-03-23

## 2017-08-14 ENCOUNTER — Ambulatory Visit: Payer: BLUE CROSS/BLUE SHIELD | Admitting: Physical Therapy

## 2017-08-14 ENCOUNTER — Encounter: Payer: Self-pay | Admitting: Physical Therapy

## 2017-08-14 DIAGNOSIS — G8929 Other chronic pain: Secondary | ICD-10-CM

## 2017-08-14 DIAGNOSIS — M545 Low back pain: Principal | ICD-10-CM

## 2017-08-14 NOTE — Therapy (Signed)
[place], [place], [address] Phone: [phone]   Fax:  [phone]  Physical Therapy Treatment  Patient Details  Name: Mckenzie Sanford MRN: [id number] Date of Birth: 07/04/1955 Referring Provider: Dr. Newman Pies   Encounter Date: 08/14/2017  PT End of Session - 08/14/17 0815    Visit Number  4    Number of Visits  12    Date for PT Re-Evaluation  09/12/17    PT Start Time  0807    PT Stop Time  0900    PT Time Calculation (min)  53 min    Activity Tolerance  Patient tolerated treatment well    Behavior During Therapy  [facility] for tasks assessed/performed       Past Medical History:  Diagnosis Date  . Allergic rhinitis   . Anxiety   . Arthritis    in back  . Breast cancer ([place]) 03/12/14   right/invasive ductal carcinoma, DCIS  . Depression   . Difficulty sleeping    takes Lorazepam as needed  . HTN (hypertension)   . Hypercholesteremia   . IBS (irritable bowel syndrome)   . Radiation 11/23/14-01/07/15   Right breast  . Wears glasses     Past Surgical History:  Procedure Laterality Date  . BREAST SURGERY  11/15   lumpectomy RT breast  . COLONOSCOPY    . CYST EXCISION     right axilla  . DILATION AND CURETTAGE OF UTERUS    . PORT-A-CATH REMOVAL N/A 03/12/2015   Procedure: REMOVAL PORT-A-CATH;  Surgeon: [name], MD;  [facility];  Service: General;  Laterality: N/A;  . PORTACATH PLACEMENT Right 06/01/2014   Procedure: ULTRASOUND GUIDED INSERTION PORT-A-CATH;  Surgeon: [name], MD;  Location: [place];  Service: General;  Laterality: Right;  . RADIOACTIVE SEED GUIDED PARTIAL MASTECTOMY WITH AXILLARY SENTINEL LYMPH NODE BIOPSY Right 04/20/2014   Procedure: RIGHT BREAST RADIOACTIVE SEED GUIDED LUMPECTOMY WITH RIGHT AXILLARY SENTINEL LYMPH NODE BIOPSY;  Surgeon: [name], MD;  Location: [place];  Service: General;  Laterality:  Right;  . TONSILLECTOMY      There were no vitals filed for this visit.  Subjective Assessment - 08/14/17 0810    Subjective  Pain was about 5/10 last night.  A little stiff in AM.  No pain now.  Having Carpal tunnel release Rt. hand in about 3 weeks.  Wears braces at night.     Currently in Pain?  No/denies          [facility] Adult PT Treatment/Exercise - 08/14/17 0001      Lumbar Exercises: Stretches   Single Knee to Chest Stretch  2 reps;30 seconds    Lower Trunk Rotation  5 reps;10 seconds      Lumbar Exercises: Aerobic   Nustep  L5 UE and LE for 6 min        Lumbar Exercises: Supine   Bent Knee Raise  10 reps done with and and without ball to destabilize, challenge cor    Bridge with [employer]  10 reps    Bridge with clamshell  10 reps    Basic Lumbar Stabilization  10 reps    Basic Lumbar Stabilization Limitations  alternating unilateral clam  added ball for challenge    Other Supine Lumbar Exercises  dead bug variation with ball       Moist Heat Therapy   Number Minutes Moist Heat  15 Minutes  Moist Heat Location  Lumbar Spine             PT Education - 08/14/17 0826    Education provided  Yes    Education Details  stretch vs strength , core     Person(s) Educated  Patient    Methods  Explanation    Comprehension  Verbalized understanding;Returned demonstration;Verbal cues required;Tactile cues required;Need further instruction          PT Long Term Goals - 08/14/17 9379      PT LONG TERM GOAL #1   Title  Pt will be I with HEP for low back, core     Status  On-going      PT LONG TERM GOAL #2   Title  Pt will be able to demo proper body mechanics, lifting for injury prevention.     Status  On-going      PT LONG TERM GOAL #3   Title  Pt will be able to sleep without limitation of pain, improved 50% or more.     Baseline  still an issue at night , only slept 3 hours last night       PT LONG TERM GOAL #4   Title  Pt will be able to attend  community class for continued back care and fitness.     Baseline  the [employer] program at the [facility] is 5:30-7:30 PM does not work with her schedule.      Status  On-going      PT LONG TERM GOAL #5   Title  Pt will score less than 35% improved on FOTO to demo improvement in functional mobility.     Status  On-going            Plan - 08/14/17 0240    Clinical Impression Statement  No pain, needs cues for core work, avoiding hyperextension of spine with hip extension and table top legs.  Interested in [place] next visit.     PT Treatment/Interventions  ADLs/Self Care Home Management;Functional mobility training;Neuromuscular re-education;Moist Heat;Ultrasound;Gait training;Therapeutic activities;Therapeutic exercise;Balance training;Patient/family education;Manual techniques    PT Next Visit Plan  check HEP, work on flexibilty, lower ab progression     PT Home Exercise Plan  post tilt, childs pose, single KTC and double KTC, abdominal draw in with knee drops and bent knee raises.     Consulted and Agree with Plan of Care  Patient       Patient will benefit from skilled therapeutic intervention in order to improve the following deficits and impairments:  Hypomobility, Difficulty walking, Decreased range of motion, Increased fascial restricitons, Impaired flexibility, Pain, Decreased strength, Decreased mobility  Visit Diagnosis: Chronic bilateral low back pain without sciatica     Problem List Patient Active Problem List   Diagnosis Date Noted  . Hypotension 05/18/2015  . Fatigue 05/18/2015  . Nausea with vomiting 07/23/2014  . Dehydration 07/23/2014  . GERD (gastroesophageal reflux disease) 07/23/2014  . Neutropenia ([place]) 07/23/2014  . Breast cancer of upper-outer quadrant of right female breast ([place]) 03/20/2014  . Palpitations 03/26/2013  . Hypertension 07/02/2011  . Hyperlipidemia 07/02/2011  . CONSTIPATION 01/22/2009  . FLATULENCE-GAS-BLOATING 01/22/2009     Sanford,Mckenzie 08/14/2017, 8:47 AM  [facility] [address] [place], [place], [address] Phone: [phone]   Fax:  [phone]  Name: Mckenzie Sanford MRN: [id number] Date of Birth: 04-Sep-1955  [name], PT 08/14/17 8:47 AM Phone: [phone] Fax: [phone]

## 2017-08-14 NOTE — Patient Instructions (Signed)
Bridge [employer] small of back into mat, maintain pelvic tilt, roll up one vertebrae at a time. Focus on engaging posterior hip muscles. Exhale as you roll up, inhale at the top, then exhale as you roll down.   Hold for ___1_ breaths. Repeat ___8-10_ times.  Copyright  VHI. All rights reserved.

## 2017-08-17 ENCOUNTER — Encounter: Payer: Self-pay | Admitting: Physical Therapy

## 2017-08-17 ENCOUNTER — Ambulatory Visit: Payer: BLUE CROSS/BLUE SHIELD | Admitting: Physical Therapy

## 2017-08-17 DIAGNOSIS — G8929 Other chronic pain: Secondary | ICD-10-CM

## 2017-08-17 DIAGNOSIS — M545 Low back pain: Principal | ICD-10-CM

## 2017-08-17 NOTE — Therapy (Signed)
[place], [place], [address] Phone: [phone]   Fax:  [phone]  Physical Therapy Treatment  Patient Details  Name: Mckenzie Sanford MRN: [id number] Date of Birth: 10-17-1955 Referring Provider: Dr. Newman Pies   Encounter Date: 08/17/2017  PT End of Session - 08/17/17 0817    Visit Number  5    Number of Visits  12    Date for PT Re-Evaluation  09/12/17    PT Start Time  0805    PT Stop Time  0845    PT Time Calculation (min)  40 min    Activity Tolerance  Patient tolerated treatment well    Behavior During Therapy  [facility] for tasks assessed/performed       Past Medical History:  Diagnosis Date  . Allergic rhinitis   . Anxiety   . Arthritis    in back  . Breast cancer ([place]) 03/12/14   right/invasive ductal carcinoma, DCIS  . Depression   . Difficulty sleeping    takes Lorazepam as needed  . HTN (hypertension)   . Hypercholesteremia   . IBS (irritable bowel syndrome)   . Radiation 11/23/14-01/07/15   Right breast  . Wears glasses     Past Surgical History:  Procedure Laterality Date  . BREAST SURGERY  11/15   lumpectomy RT breast  . COLONOSCOPY    . CYST EXCISION     right axilla  . DILATION AND CURETTAGE OF UTERUS    . PORT-A-CATH REMOVAL N/A 03/12/2015   Procedure: REMOVAL PORT-A-CATH;  Surgeon: [name], MD;  [facility];  Service: General;  Laterality: N/A;  . PORTACATH PLACEMENT Right 06/01/2014   Procedure: ULTRASOUND GUIDED INSERTION PORT-A-CATH;  Surgeon: [name], MD;  Location: [place];  Service: General;  Laterality: Right;  . RADIOACTIVE SEED GUIDED PARTIAL MASTECTOMY WITH AXILLARY SENTINEL LYMPH NODE BIOPSY Right 04/20/2014   Procedure: RIGHT BREAST RADIOACTIVE SEED GUIDED LUMPECTOMY WITH RIGHT AXILLARY SENTINEL LYMPH NODE BIOPSY;  Surgeon: [name], MD;  Location: [place];  Service: General;  Laterality:  Right;  . TONSILLECTOMY      There were no vitals filed for this visit.  Subjective Assessment - 08/17/17 0808    Subjective  Was sick for 2 days.  May have eaten something that didnt agree with my immune system.  Have not done any exercises,      Currently in Pain?  No/denies         [facility] Adult PT Treatment/Exercise - 08/17/17 0001      Pilates   Pilates Reformer  Foot work: 2 Red 1 Blue parallel and turnout on heels.  Cues needed for control of knee extension and core.  Worked on toes as well .       Lumbar Exercises: Aerobic   Nustep  L5 UE and LE for 6 min        Lumbar Exercises: Seated   Other Seated Lumbar Exercises  used magic circle, UE flexion    Other Seated Lumbar Exercises  Seated core/  ball exercises: march, knee ext, upper trunk twist, alternating arms/legs             PT Education - 08/17/17 0817    Education provided  Yes    Education Details  ball ex for core , PIlates     Person(s) Educated  Patient    Methods  Explanation    Comprehension  Verbalized understanding;Verbal cues required;Tactile cues required;Need  further instruction          PT Long Term Goals - 08/14/17 0811      PT LONG TERM GOAL #1   Title  Pt will be I with HEP for low back, core     Status  On-going      PT LONG TERM GOAL #2   Title  Pt will be able to demo proper body mechanics, lifting for injury prevention.     Status  On-going      PT LONG TERM GOAL #3   Title  Pt will be able to sleep without limitation of pain, improved 50% or more.     Baseline  still an issue at night , only slept 3 hours last night       PT LONG TERM GOAL #4   Title  Pt will be able to attend community class for continued back care and fitness.     Baseline  the [employer] program at the [facility] is 5:30-7:30 PM does not work with her schedule.      Status  On-going      PT LONG TERM GOAL #5   Title  Pt will score less than 35% improved on FOTO to demo improvement in functional mobility.      Status  On-going            Plan - 08/17/17 0818    Clinical Impression Statement  Patient able to work on core in multiple ways without increasing pain. Utilized core ball and Pilates Reformer for LE strength, lumbopelvic stability.  Poor knee control despite max cues.     PT Treatment/Interventions  ADLs/Self Care Home Management;Functional mobility training;Neuromuscular re-education;Moist Heat;Ultrasound;Gait training;Therapeutic activities;Therapeutic exercise;Balance training;Patient/family education;Manual techniques    PT Next Visit Plan  Try Reformer again? check HEP, work on flexibilty, lower ab progression     PT Home Exercise Plan  post tilt, childs pose, single KTC and double KTC, abdominal draw in with knee drops and bent knee raises.     Consulted and Agree with Plan of Care  Patient       Patient will benefit from skilled therapeutic intervention in order to improve the following deficits and impairments:  Hypomobility, Difficulty walking, Decreased range of motion, Increased fascial restricitons, Impaired flexibility, Pain, Decreased strength, Decreased mobility  Visit Diagnosis: Chronic bilateral low back pain without sciatica     Problem List Patient Active Problem List   Diagnosis Date Noted  . Hypotension 05/18/2015  . Fatigue 05/18/2015  . Nausea with vomiting 07/23/2014  . Dehydration 07/23/2014  . GERD (gastroesophageal reflux disease) 07/23/2014  . Neutropenia ([place]) 07/23/2014  . Breast cancer of upper-outer quadrant of right female breast ([place]) 03/20/2014  . Palpitations 03/26/2013  . Hypertension 07/02/2011  . Hyperlipidemia 07/02/2011  . CONSTIPATION 01/22/2009  . FLATULENCE-GAS-BLOATING 01/22/2009    Angelia Hazell 08/17/2017, 8:44 AM  [facility] [address] [place], [place], [address] Phone: [phone]   Fax:  [phone]  Name: ALEIYA RYE MRN: [id number] Date of Birth:  09-17-1955   [name], PT 08/17/17 8:44 AM Phone: [phone] Fax: [phone]

## 2017-08-21 ENCOUNTER — Ambulatory Visit: Payer: BLUE CROSS/BLUE SHIELD | Admitting: Physical Therapy

## 2017-08-21 ENCOUNTER — Encounter: Payer: Self-pay | Admitting: Physical Therapy

## 2017-08-21 DIAGNOSIS — M545 Low back pain, unspecified: Secondary | ICD-10-CM

## 2017-08-21 DIAGNOSIS — M2560 Stiffness of unspecified joint, not elsewhere classified: Secondary | ICD-10-CM

## 2017-08-21 DIAGNOSIS — M2569 Stiffness of other specified joint, not elsewhere classified: Secondary | ICD-10-CM

## 2017-08-21 DIAGNOSIS — G8929 Other chronic pain: Secondary | ICD-10-CM

## 2017-08-21 NOTE — Therapy (Signed)
[place], [place], [address] Phone: [phone]   Fax:  [phone]  Physical Therapy Treatment  Patient Details  Name: Mckenzie Sanford MRN: [id number] Date of Birth: [birth year] Referring Provider: Dr. Newman Pies   Encounter Date: 08/21/2017  PT End of Session - 08/21/17 0853    Visit Number  6    Number of Visits  12    Date for PT Re-Evaluation  09/12/17    PT Start Time  0845    PT Stop Time  0940    PT Time Calculation (min)  55 min       Past Medical History:  Diagnosis Date  . Allergic rhinitis   . Anxiety   . Arthritis    in back  . Breast cancer ([place]) 03/12/14   right/invasive ductal carcinoma, DCIS  . Depression   . Difficulty sleeping    takes Lorazepam as needed  . HTN (hypertension)   . Hypercholesteremia   . IBS (irritable bowel syndrome)   . Radiation 11/23/14-01/07/15   Right breast  . Wears glasses     Past Surgical History:  Procedure Laterality Date  . BREAST SURGERY  11/15   lumpectomy RT breast  . COLONOSCOPY    . CYST EXCISION     right axilla  . DILATION AND CURETTAGE OF UTERUS    . PORT-A-CATH REMOVAL N/A 03/12/2015   Procedure: REMOVAL PORT-A-CATH;  Surgeon: [name], MD;  [facility];  Service: General;  Laterality: N/A;  . PORTACATH PLACEMENT Right 06/01/2014   Procedure: ULTRASOUND GUIDED INSERTION PORT-A-CATH;  Surgeon: [name], MD;  Location: [place];  Service: General;  Laterality: Right;  . RADIOACTIVE SEED GUIDED PARTIAL MASTECTOMY WITH AXILLARY SENTINEL LYMPH NODE BIOPSY Right 04/20/2014   Procedure: RIGHT BREAST RADIOACTIVE SEED GUIDED LUMPECTOMY WITH RIGHT AXILLARY SENTINEL LYMPH NODE BIOPSY;  Surgeon: [name], MD;  Location: [place];  Service: General;  Laterality: Right;  . TONSILLECTOMY      There were no vitals filed for this visit.  Subjective Assessment - 08/21/17 0850    Subjective  Pain this AM when waking up. Better after stretches.     Currently in Pain?  Yes    Pain Score  3     Pain Location  Back    Pain Orientation  Right;Lower;Mid    Pain Descriptors / Indicators  -- stiffness    Aggravating Factors   first wake up, after walking     Pain Relieving Factors  meds, reposition, relax                No data recorded       [facility] Adult PT Treatment/Exercise - 08/21/17 0001      Lumbar Exercises: Aerobic   Nustep  L5 UE and LE for 6 min        Lumbar Exercises: Seated   Other Seated Lumbar Exercises  Seated core/  ball exercises: march, knee ext, upper trunk twist, alternating arms/legs      Lumbar Exercises: Supine   Bent Knee Raise  10 reps    Bridge  10 reps articulating    Bridge with clamshell  10 reps    Basic Lumbar Stabilization  10 reps    Basic Lumbar Stabilization Limitations  unilatera knee drops and bent knee raisesi- cues needed for stabilization , added heel slides and SLR       Moist Heat Therapy   Number Minutes  Moist Heat  10 Minutes    Moist Heat Location  Lumbar Spine                  PT Long Term Goals - 08/14/17 2426      PT LONG TERM GOAL #1   Title  Pt will be I with HEP for low back, core     Status  On-going      PT LONG TERM GOAL #2   Title  Pt will be able to demo proper body mechanics, lifting for injury prevention.     Status  On-going      PT LONG TERM GOAL #3   Title  Pt will be able to sleep without limitation of pain, improved 50% or more.     Baseline  still an issue at night , only slept 3 hours last night       PT LONG TERM GOAL #4   Title  Pt will be able to attend community class for continued back care and fitness.     Baseline  the [employer] program at the [facility] is 5:30-7:30 PM does not work with her schedule.      Status  On-going      PT LONG TERM GOAL #5   Title  Pt will score less than 35% improved on FOTO to demo improvement in functional mobility.     Status   On-going            Plan - 08/21/17 8341    Clinical Impression Statement  Continued core challenges today without increased pain. HMP at end of session per pt request. 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 She has not yet needed to lift a child since learning proper lifting technique.     PT Next Visit Plan  check HEP, work on flexibilty, lower ab progression     PT Home Exercise Plan  post tilt, childs pose, single KTC and double KTC, abdominal draw in with knee drops and bent knee raises.     Consulted and Agree with Plan of Care  Patient       Patient will benefit from skilled therapeutic intervention in order to improve the following deficits and impairments:  Hypomobility, Difficulty walking, Decreased range of motion, Increased fascial restricitons, Impaired flexibility, Pain, Decreased strength, Decreased mobility  Visit Diagnosis: Chronic bilateral low back pain without sciatica  Decreased ROM of trunk and back     Problem List Patient Active Problem List   Diagnosis Date Noted  . Hypotension 05/18/2015  . Fatigue 05/18/2015  . Nausea with vomiting 07/23/2014  . Dehydration 07/23/2014  . GERD (gastroesophageal reflux disease) 07/23/2014  . Neutropenia ([place]) 07/23/2014  . Breast cancer of upper-outer quadrant of right female breast ([place]) 03/20/2014  . Palpitations 03/26/2013  . Hypertension 07/02/2011  . Hyperlipidemia 07/02/2011  . CONSTIPATION 01/22/2009  . FLATULENCE-GAS-BLOATING 01/22/2009    Dorene Ar, PTA 08/21/2017, 9:49 AM  [facility] [address] [place], [place], [address] Phone: [phone]   Fax:  [phone]  Name: Mckenzie Sanford MRN: [id number] Date of Birth: 1955-10-28

## 2017-08-23 ENCOUNTER — Ambulatory Visit: Payer: BLUE CROSS/BLUE SHIELD | Admitting: Physical Therapy

## 2017-08-23 ENCOUNTER — Encounter: Payer: Self-pay | Admitting: Physical Therapy

## 2017-08-23 DIAGNOSIS — M545 Low back pain, unspecified: Secondary | ICD-10-CM

## 2017-08-23 DIAGNOSIS — M2560 Stiffness of unspecified joint, not elsewhere classified: Secondary | ICD-10-CM

## 2017-08-23 DIAGNOSIS — M2569 Stiffness of other specified joint, not elsewhere classified: Secondary | ICD-10-CM

## 2017-08-23 DIAGNOSIS — G8929 Other chronic pain: Secondary | ICD-10-CM

## 2017-08-23 NOTE — Therapy (Signed)
[place], [place], [address] Phone: [phone]   Fax:  [phone]  Physical Therapy Treatment  Patient Details  Name: Mckenzie Sanford MRN: [id number] Date of Birth: 10/09/1955 Referring Provider: Dr. Newman Pies   Encounter Date: 08/23/2017  PT End of Session - 08/23/17 0911    Visit Number  7    Number of Visits  12    Date for PT Re-Evaluation  09/12/17    PT Start Time  0849    PT Stop Time  0940    PT Time Calculation (min)  51 min       Past Medical History:  Diagnosis Date  . Allergic rhinitis   . Anxiety   . Arthritis    in back  . Breast cancer ([place]) 03/12/14   right/invasive ductal carcinoma, DCIS  . Depression   . Difficulty sleeping    takes Lorazepam as needed  . HTN (hypertension)   . Hypercholesteremia   . IBS (irritable bowel syndrome)   . Radiation 11/23/14-01/07/15   Right breast  . Wears glasses     Past Surgical History:  Procedure Laterality Date  . BREAST SURGERY  11/15   lumpectomy RT breast  . COLONOSCOPY    . CYST EXCISION     right axilla  . DILATION AND CURETTAGE OF UTERUS    . PORT-A-CATH REMOVAL N/A 03/12/2015   Procedure: REMOVAL PORT-A-CATH;  Surgeon: [name], MD;  [facility];  Service: General;  Laterality: N/A;  . PORTACATH PLACEMENT Right 06/01/2014   Procedure: ULTRASOUND GUIDED INSERTION PORT-A-CATH;  Surgeon: [name], MD;  Location: [facility];  Service: General;  Laterality: Right;  . RADIOACTIVE SEED GUIDED PARTIAL MASTECTOMY WITH AXILLARY SENTINEL LYMPH NODE BIOPSY Right 04/20/2014   Procedure: RIGHT BREAST RADIOACTIVE SEED GUIDED LUMPECTOMY WITH RIGHT AXILLARY SENTINEL LYMPH NODE BIOPSY;  Surgeon: [name], MD;  Location: [facility];  Service: General;  Laterality: Right;  . TONSILLECTOMY      There were no vitals filed for this visit.  Subjective Assessment - 08/23/17 0852    Subjective  Put some heat on my back in the car. Pain was 5/10  this morning, now 3/10.     Currently in Pain?  Yes    Pain Score  3     Pain Orientation  Right;Mid;Lower                No data recorded       [facility] Adult PT Treatment/Exercise - 08/23/17 0001      Pilates   Pilates Reformer  Foot work: 2 Red 1 Blue parallel and turnout on heels.  Cues needed for control of knee extension and core.  Worked on toes as well .       Lumbar Exercises: Aerobic   Nustep  L5 UE and LE for 5 min        Lumbar Exercises: Supine   Bent Knee Raise  --    Bridge  10 reps articulating    Bridge with clamshell  10 reps green    Basic Lumbar Stabilization  10 reps    Basic Lumbar Stabilization Limitations  unilatera knee drops and bent knee raisesi- cues needed for stabilization , added heel slides and SLR     Other Supine Lumbar Exercises  table tops holds 10 sec x 3 20 sec x 3       Moist Heat Therapy   Number Minutes  Moist Heat  10 Minutes    Moist Heat Location  Lumbar Spine seated                  PT Long Term Goals - 08/23/17 0901      PT LONG TERM GOAL #1   Title  Pt will be I with HEP for low back, core     Baseline  Independent thus far    Time  6    Period  Weeks    Status  Partially Met      PT LONG TERM GOAL #2   Title  Pt will be able to demo proper body mechanics, lifting for injury prevention.     Time  6    Period  Weeks    Status  Achieved      PT LONG TERM GOAL #3   Title  Pt will be able to sleep without limitation of pain, improved 50% or more.     Baseline  sleeping through the night, is not waking up due to backpain.     Time  6    Period  Weeks    Status  Achieved      PT LONG TERM GOAL #4   Title  Pt will be able to attend community class for continued back care and fitness.     Baseline  walking afer work as able     Time  6    Period  Weeks    Status  Partially Met      PT LONG TERM GOAL #5   Title  Pt will score less than  35% improved on FOTO to demo improvement in functional mobility.     Time  6    Period  Weeks            Plan - 08/23/17 0857    Clinical Impression Statement  Pt reports she walked 2 miles and joined in on an outdoor zumba class. She felt some pain after the zumba class. She is sleeping 4-6 hours per night and reports sleep is not limited by pack pain, only hyper activity. LTG# 3 met. Progressed to table top holds without increased pain.      PT Next Visit Plan  check HEP, work on flexibilty, lower ab progression     PT Home Exercise Plan  post tilt, childs pose, single KTC and double KTC, abdominal draw in with knee drops and bent knee raises    Consulted and Agree with Plan of Care  Patient       Patient will benefit from skilled therapeutic intervention in order to improve the following deficits and impairments:  Hypomobility, Difficulty walking, Decreased range of motion, Increased fascial restricitons, Impaired flexibility, Pain, Decreased strength, Decreased mobility  Visit Diagnosis: Chronic bilateral low back pain without sciatica  Decreased ROM of trunk and back     Problem List Patient Active Problem List   Diagnosis Date Noted  . Hypotension 05/18/2015  . Fatigue 05/18/2015  . Nausea with vomiting 07/23/2014  . Dehydration 07/23/2014  . GERD (gastroesophageal reflux disease) 07/23/2014  . Neutropenia ([facility]) 07/23/2014  . Breast cancer of upper-outer quadrant of right female breast ([facility]) 03/20/2014  . Palpitations 03/26/2013  . Hypertension 07/02/2011  . Hyperlipidemia 07/02/2011  . CONSTIPATION 01/22/2009  . FLATULENCE-GAS-BLOATING 01/22/2009    Dorene Ar, PTA 08/23/2017, 12:55 PM  [place], [place], [address] Phone: [phone]   Fax:  [phone]  Name: Mckenzie Sanford MRN: [id number] Date of Birth: 06-Feb-1956

## 2017-08-28 ENCOUNTER — Encounter: Payer: Self-pay | Admitting: Physical Therapy

## 2017-08-28 ENCOUNTER — Ambulatory Visit: Payer: BLUE CROSS/BLUE SHIELD | Attending: Neurosurgery | Admitting: Physical Therapy

## 2017-08-28 DIAGNOSIS — M2560 Stiffness of unspecified joint, not elsewhere classified: Secondary | ICD-10-CM | POA: Insufficient documentation

## 2017-08-28 DIAGNOSIS — G8929 Other chronic pain: Secondary | ICD-10-CM

## 2017-08-28 DIAGNOSIS — M545 Low back pain, unspecified: Secondary | ICD-10-CM

## 2017-08-28 NOTE — Therapy (Signed)
[place], [place], [address] Phone: [phone]   Fax:  [phone]  Physical Therapy Treatment  Patient Details  Name: Mckenzie Sanford MRN: [id number] Date of Birth: [birth year] Referring Provider: Dr. Newman Pies   Encounter Date: 08/28/2017  PT End of Session - 08/28/17 0807    Visit Number  8    Number of Visits  12    Date for PT Re-Evaluation  09/12/17    PT Start Time  0800    PT Stop Time  0846    PT Time Calculation (min)  46 min    Activity Tolerance  Patient tolerated treatment well    Behavior During Therapy  [facility] for tasks assessed/performed       Past Medical History:  Diagnosis Date  . Allergic rhinitis   . Anxiety   . Arthritis    in back  . Breast cancer ([facility]) 03/12/14   right/invasive ductal carcinoma, DCIS  . Depression   . Difficulty sleeping    takes Lorazepam as needed  . HTN (hypertension)   . Hypercholesteremia   . IBS (irritable bowel syndrome)   . Radiation 11/23/14-01/07/15   Right breast  . Wears glasses     Past Surgical History:  Procedure Laterality Date  . BREAST SURGERY  11/15   lumpectomy RT breast  . COLONOSCOPY    . CYST EXCISION     right axilla  . DILATION AND CURETTAGE OF UTERUS    . PORT-A-CATH REMOVAL N/A 03/12/2015   Procedure: REMOVAL PORT-A-CATH;  Surgeon: [name], MD;  [facility];  Service: General;  Laterality: N/A;  . PORTACATH PLACEMENT Right 06/01/2014   Procedure: ULTRASOUND GUIDED INSERTION PORT-A-CATH;  Surgeon: [name], MD;  Location: [facility];  Service: General;  Laterality: Right;  . RADIOACTIVE SEED GUIDED PARTIAL MASTECTOMY WITH AXILLARY SENTINEL LYMPH NODE BIOPSY Right 04/20/2014   Procedure: RIGHT BREAST RADIOACTIVE SEED GUIDED LUMPECTOMY WITH RIGHT AXILLARY SENTINEL LYMPH NODE BIOPSY;  Surgeon: [name], MD;  Location: [facility];  Service: General;  Laterality: Right;   . TONSILLECTOMY      There were no vitals filed for this visit.  Subjective Assessment - 08/28/17 0804    Subjective  Back is "OK". Hopes to do an outdoor zumba class today.  Walks 2.5 miles 2 days per day.  When my back starts to hurt, I head back.  We stop and rest if we need to.     How long can you sit comfortably?  it just depends, things change alot.  The rain makes it worse.     Currently in Pain?  Yes           [facility] Adult PT Treatment/Exercise - 08/28/17 0001      Pilates   Pilates Reformer  See notes , used blue band wrapped around       Lumbar Exercises: Aerobic   Nustep  L5 UE and LE for 5 min         Pilates Reformer used for LE/core strength, postural strength, lumbopelvic disassociation and core control.  Exercises included:  Footwork 2 Red 1 blue parallel heel, arch and forefoot  Turn out about 10-15 reps each mod cueing for alignment and breathing  Bridging 3 springs x 5 good carryover here Clam with band x 10 each side alternating   Supine Arm 1 Red 1 yellow Arcs x 8 , focus on breathing and Transverse Abd contraction  Supine Abs 1 Red 1 yellow chest lift x 5   Feet in Straps 1 Red 1 yellow arcs and squats x 10   Less cueing as time went on through session    PT Long Term Goals - 08/28/17 0937      PT LONG TERM GOAL #1   Title  Pt will be I with HEP for low back, core     Baseline  Independent thus far    Status  Partially Met      PT LONG TERM GOAL #2   Title  Pt will be able to demo proper body mechanics, lifting for injury prevention.     Status  Achieved      PT LONG TERM GOAL #3   Title  Pt will be able to sleep without limitation of pain, improved 50% or more.     Status  Achieved      PT LONG TERM GOAL #4   Title  Pt will be able to attend community class for continued back care and fitness.     Status  Partially Met      PT LONG TERM GOAL #5   Title  Pt will score less than 35% improved on FOTO to demo improvement in functional  mobility.     Status  Achieved            Plan - 08/28/17 0807    Clinical Impression Statement  FOTO score improved to better than predicted.  She may wish to continue Pilates in the community but right now needs mod to max cueing for technique.  She still has lower abdominal weakness, pain intermittent but does not limit her during most of her exercise activities.  Cont to see for 1-2 more weeks then DC.     PT Treatment/Interventions  ADLs/Self Care Home Management;Functional mobility training;Neuromuscular re-education;Moist Heat;Ultrasound;Gait training;Therapeutic activities;Therapeutic exercise;Balance training;Patient/family education;Manual techniques    PT Next Visit Plan  check HEP, work on flexibilty, lower ab progression , Reformer     PT Home Exercise Plan  post tilt, childs pose, single KTC and double KTC, abdominal draw in with knee drops and bent knee raises    Consulted and Agree with Plan of Care  Patient       Patient will benefit from skilled therapeutic intervention in order to improve the following deficits and impairments:  Hypomobility, Difficulty walking, Decreased range of motion, Increased fascial restricitons, Impaired flexibility, Pain, Decreased strength, Decreased mobility  Visit Diagnosis: Chronic bilateral low back pain without sciatica     Problem List Patient Active Problem List   Diagnosis Date Noted  . Hypotension 05/18/2015  . Fatigue 05/18/2015  . Nausea with vomiting 07/23/2014  . Dehydration 07/23/2014  . GERD (gastroesophageal reflux disease) 07/23/2014  . Neutropenia ([facility]) 07/23/2014  . Breast cancer of upper-outer quadrant of right female breast ([facility]) 03/20/2014  . Palpitations 03/26/2013  . Hypertension 07/02/2011  . Hyperlipidemia 07/02/2011  . CONSTIPATION 01/22/2009  . FLATULENCE-GAS-BLOATING 01/22/2009    , 08/28/2017, 9:40 AM  [facility] [address] [place], [place], [address] Phone: [phone]   Fax:  [phone]  Name: Mckenzie Sanford MRN: [id number] Date of Birth: 02/25/1956   , PT 08/28/17 9:43 AM Phone: [phone] Fax: [phone]  

## 2017-08-31 ENCOUNTER — Encounter: Payer: Self-pay | Admitting: Physical Therapy

## 2017-08-31 ENCOUNTER — Ambulatory Visit: Payer: BLUE CROSS/BLUE SHIELD | Admitting: Physical Therapy

## 2017-08-31 DIAGNOSIS — M545 Low back pain, unspecified: Secondary | ICD-10-CM

## 2017-08-31 DIAGNOSIS — G8929 Other chronic pain: Secondary | ICD-10-CM

## 2017-08-31 DIAGNOSIS — M2560 Stiffness of unspecified joint, not elsewhere classified: Secondary | ICD-10-CM

## 2017-08-31 DIAGNOSIS — M2569 Stiffness of other specified joint, not elsewhere classified: Secondary | ICD-10-CM

## 2017-08-31 NOTE — Therapy (Signed)
[place], [place], [address] Phone: [phone]   Fax:  [phone]  Physical Therapy Treatment  Patient Details  Name: Mckenzie Sanford MRN: [id number] Date of Birth: [birth year] Referring Provider: Dr. Newman Pies   Encounter Date: 08/31/2017  PT End of Session - 08/31/17 0728    Visit Number  9    Number of Visits  12    Date for PT Re-Evaluation  09/12/17    PT Start Time  0718    PT Stop Time  0807    PT Time Calculation (min)  49 min    Activity Tolerance  Patient tolerated treatment well    Behavior During Therapy  [facility] for tasks assessed/performed       Past Medical History:  Diagnosis Date  . Allergic rhinitis   . Anxiety   . Arthritis    in back  . Breast cancer ([facility]) 03/12/14   right/invasive ductal carcinoma, DCIS  . Depression   . Difficulty sleeping    takes Lorazepam as needed  . HTN (hypertension)   . Hypercholesteremia   . IBS (irritable bowel syndrome)   . Radiation 11/23/14-01/07/15   Right breast  . Wears glasses     Past Surgical History:  Procedure Laterality Date  . BREAST SURGERY  11/15   lumpectomy RT breast  . COLONOSCOPY    . CYST EXCISION     right axilla  . DILATION AND CURETTAGE OF UTERUS    . PORT-A-CATH REMOVAL N/A 03/12/2015   Procedure: REMOVAL PORT-A-CATH;  Surgeon: [name], MD;  [facility];  Service: General;  Laterality: N/A;  . PORTACATH PLACEMENT Right 06/01/2014   Procedure: ULTRASOUND GUIDED INSERTION PORT-A-CATH;  Surgeon: [name], MD;  Location: [facility];  Service: General;  Laterality: Right;  . RADIOACTIVE SEED GUIDED PARTIAL MASTECTOMY WITH AXILLARY SENTINEL LYMPH NODE BIOPSY Right 04/20/2014   Procedure: RIGHT BREAST RADIOACTIVE SEED GUIDED LUMPECTOMY WITH RIGHT AXILLARY SENTINEL LYMPH NODE BIOPSY;  Surgeon: [name], MD;  Location: [facility];  Service: General;  Laterality: Right;   . TONSILLECTOMY      There were no vitals filed for this visit.  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 i have to be mindfull while I am at work"    Currently in Pain?  Yes    Pain Score  1     Pain Orientation  Right;Left;Mid    Pain Onset  More than a month ago    Pain Frequency  Intermittent    Aggravating Factors   first thing in the morning.                        [facility] Adult PT Treatment/Exercise - 08/31/17 0732      Lumbar Exercises: Stretches   Quadruped Mid Back Stretch  30 seconds;3 reps    Quadruped Mid Back Stretch Limitations   laterals 2 x 30 each way       Lumbar Exercises: Aerobic   Nustep  L5 UE and LE for 5 min        Lumbar Exercises: Seated   Other Seated Lumbar Exercises  seated on physioball pelvic tilts, seated marching 2 x 10, 1 set LE only, 1 set with UE/LE    Other Seated Lumbar Exercises  horizontal lift/ chop while seated on physioball 1 x 10 bil with  cues to keep core tight      Lumbar Exercises: Supine   Other Supine Lumbar Exercises  laying on foam pink foam roll marching 2 x 10 1 set with hands on table, 1 set with hands off table      Lumbar Exercises: Quadruped   Other Quadruped Lumbar Exercises  rocking back and forth with focus on keeping back in neutral positions 2 x 10 tactile cues for proper form      Moist Heat Therapy   Number Minutes Moist Heat  10 Minutes    Moist Heat Location  Lumbar Spine seated                  PT Long Term Goals - 08/28/17 0623      PT LONG TERM GOAL #1   Title  Pt will be I with HEP for low back, core     Baseline  Independent thus far    Status  Partially Met      PT LONG TERM GOAL #2   Title  Pt will be able to demo proper body mechanics, lifting for injury prevention.     Status  Achieved      PT LONG TERM GOAL #3   Title  Pt will be able to sleep without limitation of pain, improved 50% or more.     Status   Achieved      PT LONG TERM GOAL #4   Title  Pt will be able to attend community class for continued back care and fitness.     Status  Partially Met      PT LONG TERM GOAL #5   Title  Pt will score less than 35% improved on FOTO to demo improvement in functional mobility.     Status  Achieved            Plan - 08/31/17 0801    Clinical Impression Statement  Mrs. Gell reported only 1/10 pain today. continued focus on strengthening of the core in supine and seated with unstable surfaces. worked on lumbopelvic dissocation with quadruped rocking which she performed well and reported no pain at end of session.     PT Next Visit Plan  check HEP, work on flexibilty, lower ab progression , Reformer     PT Home Exercise Plan  post tilt, childs pose, single KTC and double KTC, abdominal draw in with knee drops and bent knee raises    Consulted and Agree with Plan of Care  Patient       Patient will benefit from skilled therapeutic intervention in order to improve the following deficits and impairments:  Hypomobility, Difficulty walking, Decreased range of motion, Increased fascial restricitons, Impaired flexibility, Pain, Decreased strength, Decreased mobility  Visit Diagnosis: Chronic bilateral low back pain without sciatica  Decreased ROM of trunk and back     Problem List Patient Active Problem List   Diagnosis Date Noted  . Hypotension 05/18/2015  . Fatigue 05/18/2015  . Nausea with vomiting 07/23/2014  . Dehydration 07/23/2014  . GERD (gastroesophageal reflux disease) 07/23/2014  . Neutropenia ([place]) 07/23/2014  . Breast cancer of upper-outer quadrant of right female breast ([place] [facility] [place]) 03/20/2014  . Palpitations 03/26/2013  . Hypertension 07/02/2011  . Hyperlipidemia 07/02/2011  . CONSTIPATION 01/22/2009  . FLATULENCE-GAS-BLOATING 01/22/2009    Starr Lake PT, DPT, LAT, ATC  08/31/17  8:04 AM      [facility] [address] [place], [place], [address] Phone:  [phone]   Fax:  [phone]  Name: Mckenzie Sanford MRN: [id number] Date of Birth: 04-26-1956

## 2017-09-03 ENCOUNTER — Ambulatory Visit: Payer: BLUE CROSS/BLUE SHIELD | Admitting: Physical Therapy

## 2017-09-03 DIAGNOSIS — G8929 Other chronic pain: Secondary | ICD-10-CM

## 2017-09-03 DIAGNOSIS — M545 Low back pain, unspecified: Secondary | ICD-10-CM

## 2017-09-03 NOTE — Therapy (Signed)
[place], [place], [address] Phone: [phone]   Fax:  [phone]  Physical Therapy Treatment  Patient Details  Name: Mckenzie Sanford MRN: [id number] Date of Birth: 1955/06/18 Referring Provider: Dr. Newman Pies   Encounter Date: 09/03/2017  PT End of Session - 09/03/17 0756    Visit Number  10    Number of Visits  12    Date for PT Re-Evaluation  09/12/17    PT Start Time  0800    PT Stop Time  0843    PT Time Calculation (min)  43 min    Activity Tolerance  Patient tolerated treatment well    Behavior During Therapy  [facility] for tasks assessed/performed       Past Medical History:  Diagnosis Date  . Allergic rhinitis   . Anxiety   . Arthritis    in back  . Breast cancer ([place]) 03/12/14   right/invasive ductal carcinoma, DCIS  . Depression   . Difficulty sleeping    takes Lorazepam as needed  . HTN (hypertension)   . Hypercholesteremia   . IBS (irritable bowel syndrome)   . Radiation 11/23/14-01/07/15   Right breast  . Wears glasses     Past Surgical History:  Procedure Laterality Date  . BREAST SURGERY  11/15   lumpectomy RT breast  . COLONOSCOPY    . CYST EXCISION     right axilla  . DILATION AND CURETTAGE OF UTERUS    . PORT-A-CATH REMOVAL N/A 03/12/2015   Procedure: REMOVAL PORT-A-CATH;  Surgeon: [name], MD;  [facility];  Service: General;  Laterality: N/A;  . PORTACATH PLACEMENT Right 06/01/2014   Procedure: ULTRASOUND GUIDED INSERTION PORT-A-CATH;  Surgeon: [name], MD;  Location: [facility];  Service: General;  Laterality: Right;  . RADIOACTIVE SEED GUIDED PARTIAL MASTECTOMY WITH AXILLARY SENTINEL LYMPH NODE BIOPSY Right 04/20/2014   Procedure: RIGHT BREAST RADIOACTIVE SEED GUIDED LUMPECTOMY WITH RIGHT AXILLARY SENTINEL LYMPH NODE BIOPSY;  Surgeon: [name], MD;  Location: [facility];  Service: General;  Laterality:  Right;  . TONSILLECTOMY      There were no vitals filed for this visit.  Subjective Assessment - 09/03/17 0803    Subjective  I'm sleepy today.  Having surgery Thursday.     Currently in Pain?  No/denies had a little this AM            [facility] Adult PT Treatment/Exercise - 09/03/17 0001      Lumbar Exercises: Aerobic   Nustep  L5 UE and LE for 5 min        Lumbar Exercises: Standing   Functional Squats  15 reps    Functional Squats Limitations  used kettlebell for reps to mimick lifting children, various depths , no back pain but had some knee pain     Other Standing Lumbar Exercises  P[place]off Press blue band x 10 and added oblique rotation  x10       Lumbar Exercises: Supine   Pelvic Tilt  10 reps    Bridge  10 reps articulating    Bridge with clamshell  10 reps green    Basic Lumbar Stabilization  10 reps    Basic Lumbar Stabilization Limitations  unilatera knee drops and bent knee raisesi- cues needed for stabilization , added SLR       Moist Heat Therapy   Number Minutes Moist Heat  -- declined     Moist Heat  Location  --              PT Long Term Goals - 09/03/17 0804      PT LONG TERM GOAL #1   Title  Pt will be I with HEP for low back, core     Status  Partially Met      PT LONG TERM GOAL #2   Title  Pt will be able to demo proper body mechanics, lifting for injury prevention.     Status  Achieved      PT LONG TERM GOAL #3   Title  Pt will be able to sleep without limitation of pain, improved 50% or more.     Baseline  sleeping through the night, is not waking up due to backpain.     Status  Achieved      PT LONG TERM GOAL #4   Title  Pt will be able to attend community class for continued back care and fitness.     Baseline  walking afer work as able     Status  Partially Met      PT LONG TERM GOAL #5   Title  Pt will score less than 35% improved on FOTO to demo improvement in functional mobility.     Status  Achieved            Plan -  09/03/17 0804    Clinical Impression Statement  Patient has met or partially met all goals.  Patient takes increased time for carryover and processing cues.  Knows her exercises but not the rationale.  We will use the last visit to fully wrap up POC and encourage I with HEp, carryover of posture and lifting/body mech. Having surgery Thursday for carpal tunnel syndrome.     PT Treatment/Interventions  ADLs/Self Care Home Management;Functional mobility training;Neuromuscular re-education;Moist Heat;Ultrasound;Gait training;Therapeutic activities;Therapeutic exercise;Balance training;Patient/family education;Manual techniques    PT Next Visit Plan  finalize HEP, print out if needed.  Standing core, maybe give squat if she can do without pain     PT Home Exercise Plan  post tilt, childs pose, single KTC and double KTC, abdominal draw in with knee drops and bent knee raises    Consulted and Agree with Plan of Care  Patient       Patient will benefit from skilled therapeutic intervention in order to improve the following deficits and impairments:  Hypomobility, Difficulty walking, Decreased range of motion, Increased fascial restricitons, Impaired flexibility, Pain, Decreased strength, Decreased mobility  Visit Diagnosis: Chronic bilateral low back pain without sciatica     Problem List Patient Active Problem List   Diagnosis Date Noted  . Hypotension 05/18/2015  . Fatigue 05/18/2015  . Nausea with vomiting 07/23/2014  . Dehydration 07/23/2014  . GERD (gastroesophageal reflux disease) 07/23/2014  . Neutropenia ([place]) 07/23/2014  . Breast cancer of upper-outer quadrant of right female breast ([place]) 03/20/2014  . Palpitations 03/26/2013  . Hypertension 07/02/2011  . Hyperlipidemia 07/02/2011  . CONSTIPATION 01/22/2009  . FLATULENCE-GAS-BLOATING 01/22/2009    Lelar Farewell 09/03/2017, 8:48 AM  [facility] [address] [place], [place],  [address] Phone: [phone]   Fax:  [phone]  Name: NYARI OLSSON MRN: [id number] Date of Birth: 24-Jan-1956  [name], PT 09/03/17 8:49 AM Phone: [phone] Fax: [phone]

## 2017-09-05 ENCOUNTER — Encounter: Payer: Self-pay | Admitting: Physical Therapy

## 2017-09-05 ENCOUNTER — Ambulatory Visit: Payer: BLUE CROSS/BLUE SHIELD | Admitting: Physical Therapy

## 2017-09-05 DIAGNOSIS — G8929 Other chronic pain: Secondary | ICD-10-CM

## 2017-09-05 DIAGNOSIS — M545 Low back pain: Principal | ICD-10-CM

## 2017-09-05 NOTE — Therapy (Signed)
[place], [place], [address] Phone: [phone]   Fax:  [phone]  Physical Therapy Treatment and Discharge   Patient Details  Name: Mckenzie Sanford MRN: [id number] Date of Birth: 1956/01/14 Referring Provider: Dr. Newman Pies   Encounter Date: 09/05/2017  PT End of Session - 09/05/17 0823    Visit Number  11    Number of Visits  12    Date for PT Re-Evaluation  09/12/17    PT Start Time  0802    PT Stop Time  0843    PT Time Calculation (min)  41 min    Activity Tolerance  Patient tolerated treatment well    Behavior During Therapy  [facility] for tasks assessed/performed       Past Medical History:  Diagnosis Date  . Allergic rhinitis   . Anxiety   . Arthritis    in back  . Breast cancer ([place]) 03/12/14   right/invasive ductal carcinoma, DCIS  . Depression   . Difficulty sleeping    takes Lorazepam as needed  . HTN (hypertension)   . Hypercholesteremia   . IBS (irritable bowel syndrome)   . Radiation 11/23/14-01/07/15   Right breast  . Wears glasses     Past Surgical History:  Procedure Laterality Date  . BREAST SURGERY  11/15   lumpectomy RT breast  . COLONOSCOPY    . CYST EXCISION     right axilla  . DILATION AND CURETTAGE OF UTERUS    . PORT-A-CATH REMOVAL N/A 03/12/2015   Procedure: REMOVAL PORT-A-CATH;  Surgeon: [name], MD;  Location: [facility];  Service: General;  Laterality: N/A;  . PORTACATH PLACEMENT Right 06/01/2014   Procedure: ULTRASOUND GUIDED INSERTION PORT-A-CATH;  Surgeon: [name], MD;  Location: [facility];  Service: General;  Laterality: Right;  . RADIOACTIVE SEED GUIDED PARTIAL MASTECTOMY WITH AXILLARY SENTINEL LYMPH NODE BIOPSY Right 04/20/2014   Procedure: RIGHT BREAST RADIOACTIVE SEED GUIDED LUMPECTOMY WITH RIGHT AXILLARY SENTINEL LYMPH NODE BIOPSY;  Surgeon: [name], MD;  Location: [facility];  Service: General;   Laterality: Right;  . TONSILLECTOMY      There were no vitals filed for this visit.  Subjective Assessment - 09/05/17 0809    Subjective  No pain today.  Im ready for my procedure.  I'm tired of waking up with numb hands.     Currently in Pain?  No/denies         [facility] PT Assessment - 09/05/17 0001      Observation/Other Assessments   Focus on Therapeutic Outcomes (FOTO)   31%      AROM   Lumbar Flexion  needs to bend knees to touch     Lumbar Extension  [facility]    Lumbar - Right Side Bend  [facility]    Lumbar - Left Side Bend  [facility]    Lumbar - Right Rotation  [facility]    Lumbar - Left Rotation  [facility]       Strength   Right Hip ABduction  5/5    Left Hip ABduction  4+/5    Right Knee Flexion  5/5    Right Knee Extension  5/5    Left Knee Flexion  5/5    Left Knee Extension  5/5          [facility] Adult PT Treatment/Exercise - 09/05/17 0001      Lumbar Exercises: Stretches   Gastroc Stretch  3 reps;30 seconds  Lumbar Exercises: Aerobic   Elliptical  L3 for 5 min, ramp 5 UE and LE       Lumbar Exercises: Seated   Sit to Stand  15 reps    Other Seated Lumbar Exercises  used blue band to promote hip abduction and proper LE alignment       Lumbar Exercises: Supine   Clam  15 reps    Bridge  10 reps    Bridge with clamshell  10 reps             PT Education - 09/05/17 [phone]    Education provided  Yes    Education Details  LE alignment, form with squat    Person(s) Educated  Patient    Methods  Explanation    Comprehension  Verbalized understanding          PT Long Term Goals - 09/05/17 0824      PT LONG TERM GOAL #1   Title  Pt will be I with HEP for low back, core     Status  Achieved      PT LONG TERM GOAL #2   Title  Pt will be able to demo proper body mechanics, lifting for injury prevention.     Status  Achieved      PT LONG TERM GOAL #3   Title  Pt will be able to sleep without limitation of pain, improved 50% or more.     Status  Achieved      PT  LONG TERM GOAL #4   Title  Pt will be able to attend community class for continued back care and fitness.     Status  Achieved      PT LONG TERM GOAL #5   Title  Pt will score less than 35% improved on FOTO to demo improvement in functional mobility.     Status  Achieved            Plan - 09/05/17 0839    Clinical Impression Statement  Patient has met all goals. DC from PT      PT Treatment/Interventions  ADLs/Self Care Home Management;Functional mobility training;Neuromuscular re-education;Moist Heat;Ultrasound;Gait training;Therapeutic activities;Therapeutic exercise;Balance training;Patient/family education;Manual techniques    PT Next Visit Plan  DC    PT Home Exercise Plan  post tilt, childs pose, single KTC and double KTC, abdominal draw in with knee drops and bent knee raises    Consulted and Agree with Plan of Care  Patient       Patient will benefit from skilled therapeutic intervention in order to improve the following deficits and impairments:  Hypomobility, Difficulty walking, Decreased range of motion, Increased fascial restricitons, Impaired flexibility, Pain, Decreased strength, Decreased mobility  Visit Diagnosis: Chronic bilateral low back pain without sciatica     Problem List Patient Active Problem List   Diagnosis Date Noted  . Hypotension 05/18/2015  . Fatigue 05/18/2015  . Nausea with vomiting 07/23/2014  . Dehydration 07/23/2014  . GERD (gastroesophageal reflux disease) 07/23/2014  . Neutropenia ([place]) 07/23/2014  . Breast cancer of upper-outer quadrant of right female breast ([place]) 03/20/2014  . Palpitations 03/26/2013  . Hypertension 07/02/2011  . Hyperlipidemia 07/02/2011  . CONSTIPATION 01/22/2009  . FLATULENCE-GAS-BLOATING 01/22/2009    Verena Shawgo 09/05/2017, 9:40 AM  [facility] [address] [place], [place], [address] Phone: [phone]   Fax:  [phone]  Name: GRACELIN WEISBERG MRN: [id number] Date of Birth: [birth year]  PHYSICAL THERAPY DISCHARGE SUMMARY  Visits from Start of Care:11  Current functional level related to goals / functional outcomes: See above, not limited by back pain in daily activities   Remaining deficits: Mild hip weakness, intermittent mild back pain    Education / Equipment: HEP, posture, lifting  Plan: Patient agrees to discharge.  Patient goals were met. Patient is being discharged due to meeting the stated rehab goals.  ?????    [name], PT 09/05/17 9:41 AM Phone: [phone] Fax: [phone]

## 2017-09-10 ENCOUNTER — Ambulatory Visit: Payer: BLUE CROSS/BLUE SHIELD | Admitting: Physical Therapy

## 2017-09-12 ENCOUNTER — Ambulatory Visit: Payer: BLUE CROSS/BLUE SHIELD | Admitting: Physical Therapy

## 2017-12-17 ENCOUNTER — Encounter (HOSPITAL_COMMUNITY): Payer: Self-pay | Admitting: Emergency Medicine

## 2017-12-17 ENCOUNTER — Emergency Department (HOSPITAL_COMMUNITY): Payer: BLUE CROSS/BLUE SHIELD

## 2017-12-17 ENCOUNTER — Emergency Department (HOSPITAL_COMMUNITY)
Admission: EM | Admit: 2017-12-17 | Discharge: 2017-12-17 | Disposition: A | Discharge status: Home / Self Care | Payer: BLUE CROSS/BLUE SHIELD | Attending: Emergency Medicine | Admitting: Emergency Medicine

## 2017-12-17 DIAGNOSIS — C50411 Malignant neoplasm of upper-outer quadrant of right female breast: Secondary | ICD-10-CM | POA: Insufficient documentation

## 2017-12-17 DIAGNOSIS — R079 Chest pain, unspecified: Secondary | ICD-10-CM | POA: Diagnosis not present

## 2017-12-17 DIAGNOSIS — I10 Essential (primary) hypertension: Secondary | ICD-10-CM | POA: Diagnosis not present

## 2017-12-17 DIAGNOSIS — Z79899 Other long term (current) drug therapy: Secondary | ICD-10-CM | POA: Insufficient documentation

## 2017-12-17 LAB — BASIC METABOLIC PANEL
ANION GAP: 8 (ref 5–15)
BUN: 11 mg/dL (ref 6–20)
CHLORIDE: 100 mmol/L (ref 98–111)
CO2: 32 mmol/L (ref 22–32)
Calcium: 10.2 mg/dL (ref 8.9–10.3)
Creatinine, Ser: 0.68 mg/dL (ref 0.44–1.00)
GFR calc Af Amer: >60 mL/min (ref >60)
GFR calc non Af Amer: >60 mL/min (ref >60)
Glucose, Bld: 88 mg/dL (ref 70–99)
POTASSIUM: 3.3 mmol/L — AB (ref 3.5–5.1)
SODIUM: 140 mmol/L (ref 135–145)

## 2017-12-17 LAB — CBC
HCT: 43.8 % (ref 36.0–46.0)
HEMOGLOBIN: 14.8 g/dL (ref 12.0–15.0)
MCH: 30.6 pg (ref 26.0–34.0)
MCHC: 33.8 g/dL (ref 30.0–36.0)
MCV: 90.7 fL (ref 78.0–100.0)
PLATELETS: 352 10*3/uL (ref 150–400)
RBC: 4.83 MIL/uL (ref 3.87–5.11)
RDW: 12.5 % (ref 11.5–15.5)
WBC: 4.5 10*3/uL (ref 4.0–10.5)

## 2017-12-17 LAB — D-DIMER, QUANTITATIVE (NOT AT ARMC): D DIMER QUANT: 0.34 ug{FEU}/mL (ref 0.00–0.50)

## 2017-12-17 LAB — I-STAT TROPONIN, ED: Troponin i, poc: 0 ng/mL (ref 0.00–0.08)

## 2017-12-17 LAB — I-STAT BETA HCG BLOOD, ED (MC, WL, AP ONLY)

## 2017-12-17 MED ORDER — SUCRALFATE 1 G PO TABS: 1.0000 g | ORAL_TABLET | Freq: Four times a day (QID) | ORAL | 0 refills | Status: DC | PRN

## 2017-12-17 NOTE — ED Triage Notes (Signed)
Pt c/o intermittent central chest pains since Friday with SOB. Pt reports that her PCP informed her to go to ED when she called them today.

## 2017-12-17 NOTE — ED Provider Notes (Signed)
[facility] DEPT Provider Note   CSN: [id number] Arrival date & time: 12/17/17  1205     History   Chief Complaint Chief Complaint  Patient presents with  . Chest Pain  . Shortness of Breath    HPI Mckenzie Sanford is a 62 y.o. female with a history of hypertension and breast cancer who presents to the ED with chief complaint of chest pain.  The chest pain has been present for the past 3 to 4 days, intermittent.  Pain is located in the central chest, described as a sharp pain mild to moderate in severity.  The pain occasionally radiates down to the epigastrium.  Associated with the chest pain, patient also experiences occasional shortness of breath.  The chest pain does not appear to be exertional, but the patient states that it does become worse with deep breathing.  Patient denies any dizziness, diaphoresis, nausea or vomiting.  No recent pain or swelling in the legs.  No recent long travel, no recent immobilization.  HPI  Past Medical History:  Diagnosis Date  . Allergic rhinitis   . Anxiety   . Arthritis    in back  . Breast cancer ([place]) 03/12/14   right/invasive ductal carcinoma, DCIS  . Depression   . Difficulty sleeping    takes Lorazepam as needed  . HTN (hypertension)   . Hypercholesteremia   . IBS (irritable bowel syndrome)   . Radiation 11/23/14-01/07/15   Right breast  . Wears glasses     Patient Active Problem List   Diagnosis Date Noted  . Hypotension 05/18/2015  . Fatigue 05/18/2015  . Nausea with vomiting 07/23/2014  . Dehydration 07/23/2014  . GERD (gastroesophageal reflux disease) 07/23/2014  . Neutropenia ([place]) 07/23/2014  . Breast cancer of upper-outer quadrant of right female breast ([place]) 03/20/2014  . Palpitations 03/26/2013  . Hypertension 07/02/2011  . Hyperlipidemia 07/02/2011  . CONSTIPATION 01/22/2009  . FLATULENCE-GAS-BLOATING 01/22/2009    Past Surgical History:  Procedure Laterality Date  . BREAST  SURGERY  11/15   lumpectomy RT breast  . COLONOSCOPY    . CYST EXCISION     right axilla  . DILATION AND CURETTAGE OF UTERUS    . PORT-A-CATH REMOVAL N/A 03/12/2015   Procedure: REMOVAL PORT-A-CATH;  Surgeon: [name], MD;  Location: [facility];  Service: General;  Laterality: N/A;  . PORTACATH PLACEMENT Right 06/01/2014   Procedure: ULTRASOUND GUIDED INSERTION PORT-A-CATH;  Surgeon: [name], MD;  Location: [facility];  Service: General;  Laterality: Right;  . RADIOACTIVE SEED GUIDED PARTIAL MASTECTOMY WITH AXILLARY SENTINEL LYMPH NODE BIOPSY Right 04/20/2014   Procedure: RIGHT BREAST RADIOACTIVE SEED GUIDED LUMPECTOMY WITH RIGHT AXILLARY SENTINEL LYMPH NODE BIOPSY;  Surgeon: [name], MD;  Location: [facility];  Service: General;  Laterality: Right;  . TONSILLECTOMY       OB History   None      Home Medications    Prior to Admission medications   Medication Sig Start Date End Date Taking? Authorizing Provider  acetaminophen (TYLENOL 8 HOUR) 650 MG CR tablet Take 650 mg by mouth every 8 (eight) hours as needed for pain.   Yes [provider]  amLODipine (NORVASC) 10 MG tablet Take 5 mg by mouth at bedtime.    Yes [provider]  atorvastatin (LIPITOR) 20 MG tablet Take 20 mg by mouth daily.   Yes [provider]  cholecalciferol (VITAMIN D) 1000 units tablet Take 1 tablet (1,000 Units  total) by mouth daily. 05/15/17  Yes [name], MD  [facility] Liver Oil 5000-500 UNIT/5ML OIL Take 5 mLs by mouth daily. 05/15/17  Yes [name], MD  diphenhydrAMINE (BENADRYL) 25 mg capsule Take 25 mg by mouth every 6 (six) hours as needed for itching.    Yes [provider]  exemestane (AROMASIN) 25 MG tablet TAKE 1 TABLET BY MOUTH DAILY AFTER BREAKFAST 02/19/17  Yes [name], MD  hydrochlorothiazide (HYDRODIURIL) 25 MG tablet Take 25 mg by mouth daily. 10/07/17  Yes [provider]  irbesartan (AVAPRO)  300 MG tablet Take 150 mg by mouth 2 (two) times daily.  04/26/14  Yes [provider]  LINZESS 290 MCG CAPS capsule Take 290 mcg by mouth daily as needed (IBS).  07/06/14  Yes [provider]  methocarbamol (ROBAXIN) 500 MG tablet Take 1 tablet (500 mg total) by mouth 4 (four) times daily. Patient taking differently: Take 750 mg by mouth every 8 (eight) hours as needed for muscle spasms.  05/15/17  Yes [name], MD  metoprolol succinate (TOPROL-XL) 50 MG 24 hr tablet Take 50 mg by mouth every morning. Take with or immediately following a meal.   Yes [provider]  naproxen (NAPROSYN) 250 MG tablet Take 250 mg by mouth daily as needed for mild pain.   Yes [provider]  Polyethyl Glycol-Propyl Glycol (SYSTANE OP) Apply 1 drop to eye daily as needed (Dry eyes).   Yes [provider]  polyethylene glycol (MIRALAX / GLYCOLAX) packet Take 17 g by mouth 2 (two) times daily as needed.   Yes [provider]  sertraline (ZOLOFT) 50 MG tablet Take 1 tablet (50 mg total) by mouth daily. 05/15/17  Yes [name], MD  sodium chloride (OCEAN) 0.65 % SOLN nasal spray Place 1 spray into both nostrils as needed for congestion.   Yes [provider]  vitamin C (ASCORBIC ACID) 250 MG tablet Take 1 tablet (250 mg total) by mouth daily. 05/15/17  Yes [name], MD  sucralfate (CARAFATE) 1 g tablet Take 1 tablet (1 g total) by mouth 4 (four) times daily as needed. 12/17/17   Maudie Flakes, MD    Family History Family History  Problem Relation Age of Onset  . Heart attack Father   . Colon cancer Neg Hx   . Rectal cancer Neg Hx   . Stomach cancer Neg Hx   . Esophageal cancer Neg Hx   . Liver cancer Neg Hx     Social History Social History   Tobacco Use  . Smoking status: Never Smoker  . Smokeless tobacco: Never Used  Substance Use Topics  . Alcohol use: Yes    Alcohol/week: 0.0 oz    Comment: social  . Drug use: No      Allergies   Shellfish allergy; Ivp dye [iodinated diagnostic agents]; and Penicillins   Review of Systems Review of Systems Positive for chest pain or shortness of breath.  All other systems reviewed and negative.  Physical Exam Updated Vital Signs BP (!) 167/86   Pulse (!) 51   Resp 11   Ht 5\' 2"  (1.575 m)   Wt 58.5 kg (129 lb)   LMP 09/26/2011   SpO2 99%   BMI 23.59 kg/m   Physical Exam Vital Signs and Nursing Notes reviewed Vitals:   12/17/17 1500 12/17/17 1515  BP: (!) 164/86 (!) 167/86  Pulse: (!) 53 (!) 51  Resp: 18 11  SpO2: 98% 99%  CONSTITUTIONAL:  well-appearing, NAD NEURO:  Alert and oriented x 3, no focal deficits EYES:  eyes equal and reactive ENT/NECK:  no LAD, no JVD CARDIO:  regular rate, well-perfused, normal S1 and S2, mild to moderate tenderness to palpation to the central chest. PULM:  CTAB no wheezing or rhonchi GI/GU:  normal bowel sounds, non-distended, non-tender MSK/SPINE:  No gross deformities, no edema, no tenderness, no rash to the lower extremities. SKIN:  no rash, atraumatic PSYCH:  Appropriate speech and behavior   ED Treatments / Results  Labs (all labs ordered are listed, but only abnormal results are displayed) Labs Reviewed  BASIC METABOLIC PANEL - Abnormal; Notable for the following components:      Result Value   Potassium 3.3 (*)    All other components within normal limits  CBC  D-DIMER, QUANTITATIVE (NOT AT [facility])  I-STAT TROPONIN, ED  I-STAT BETA HCG BLOOD, ED ([facility], [facility], [facility] ONLY)    EKG EKG Interpretation  Date/Time:  Monday December 17 2017 12:11:46 EDT Ventricular Rate:  61 PR Interval:    QRS Duration: 83 QT Interval:  420 QTC Calculation: 423 R Axis:   4 Text Interpretation:  Sinus rhythm Probable left atrial enlargement Low voltage, precordial leads Borderline repolarization abnormality Baseline wander in lead(s) V1 no acute change from prior Confirmed by Gerlene Fee [phone]) on 12/17/2017 1:32:26  PM   Radiology Dg Chest 2 View  Result Date: 12/17/2017 CLINICAL DATA:  Chest pain and shortness of breath. EXAM: CHEST - 2 VIEW COMPARISON:  08/05/2014 FINDINGS: The heart size and mediastinal contours are within normal limits. Both lungs are clear. Slight pectus excavatum deformity. Power port has been removed since the prior study. IMPRESSION: No active cardiopulmonary disease. Electronically Signed   By: Lorriane Shire M.D.   On: 12/17/2017 12:28    Procedures Procedures (including critical care time)  Medications Ordered in ED Medications - No data to display   Initial Impression / Assessment and Plan / ED Course  I have reviewed the triage vital signs and the nursing notes.  Pertinent labs & imaging results that were available during my care of the patient were reviewed by me and considered in my medical decision making (see chart for details).  Clinical Course as of Dec 18 1822  Mon Dec 17, 2017  1405 64F h/o HTN, HLD, and breast cancer currently on treatment regimen here with 3-4 days of intermittent central sharp chest pain, sometimes worse with deep breath.  Atypical and less likely cardiac chest pain, considering PE vs MSK.  Ddimer pending.  EKG with no ischemic changes.   [MB]  9024 Ddimer negative, will fu with regular doctors.   [MB]    Clinical Course User Index [MB] Maudie Flakes, MD   Upon reevaluation, patient continues to look well.  Labs unremarkable, with negative troponin and d-dimer.  EKG with no ischemic changes, chest x-ray with no concerning features.  Favoring MSK etiology versus GERD.  Advised Naprosyn at home as needed..  Also given prescription for Carafate as needed.  We will follow-up with her regular doctors.   After the discussed management above, the patient was determined to be safe for discharge.  The patient was in agreement with this plan and all questions regarding their care were answered.  ED return precautions were discussed and the  patient will return to the ED with any significant worsening of condition.  Patient follow-up:  PCP  Restrictions:  none  Barth Kirks. [place] Small, [place]  Emergency [place] [email or web address]   Final Clinical Impressions(s) / ED Diagnoses   Final diagnoses:  Chest pain, unspecified type    ED Discharge Orders        Ordered    sucralfate (CARAFATE) 1 g tablet  4 times daily PRN,   Status:  Discontinued     12/17/17 1516    sucralfate (CARAFATE) 1 g tablet  4 times daily PRN     12/17/17 1518       Maudie Flakes, MD 12/17/17 1827

## 2017-12-17 NOTE — Discharge Instructions (Addendum)
You were evaluated at the [facility] Emergency Department.  After careful evaluation, we did not find any emergent condition requiring admission or hospital stay.  Your symptoms today seem to be due to inflammation of the chest wall.  Please inform your regular doctors of your visit here today and take the Aleve at home as we discussed.  Please return to the Emergency Department if you experience any worsening or your condition.

## 2017-12-24 ENCOUNTER — Encounter (HOSPITAL_COMMUNITY): Payer: Self-pay

## 2017-12-24 ENCOUNTER — Other Ambulatory Visit: Payer: Self-pay

## 2017-12-24 ENCOUNTER — Emergency Department (HOSPITAL_COMMUNITY): Payer: BLUE CROSS/BLUE SHIELD

## 2017-12-24 ENCOUNTER — Observation Stay (HOSPITAL_COMMUNITY)
Admission: EM | Admit: 2017-12-24 | Discharge: 2017-12-25 | Disposition: A | Discharge status: Home / Self Care | Payer: BLUE CROSS/BLUE SHIELD | Attending: Internal Medicine | Admitting: Internal Medicine

## 2017-12-24 DIAGNOSIS — F329 Major depressive disorder, single episode, unspecified: Secondary | ICD-10-CM | POA: Insufficient documentation

## 2017-12-24 DIAGNOSIS — F419 Anxiety disorder, unspecified: Secondary | ICD-10-CM | POA: Insufficient documentation

## 2017-12-24 DIAGNOSIS — Z880 Allergy status to penicillin: Secondary | ICD-10-CM | POA: Diagnosis not present

## 2017-12-24 DIAGNOSIS — Z853 Personal history of malignant neoplasm of breast: Secondary | ICD-10-CM | POA: Insufficient documentation

## 2017-12-24 DIAGNOSIS — K589 Irritable bowel syndrome without diarrhea: Secondary | ICD-10-CM | POA: Insufficient documentation

## 2017-12-24 DIAGNOSIS — R55 Syncope and collapse: Secondary | ICD-10-CM | POA: Diagnosis not present

## 2017-12-24 DIAGNOSIS — Z91041 Radiographic dye allergy status: Secondary | ICD-10-CM | POA: Diagnosis not present

## 2017-12-24 DIAGNOSIS — E785 Hyperlipidemia, unspecified: Secondary | ICD-10-CM | POA: Diagnosis not present

## 2017-12-24 DIAGNOSIS — C50411 Malignant neoplasm of upper-outer quadrant of right female breast: Secondary | ICD-10-CM | POA: Diagnosis present

## 2017-12-24 DIAGNOSIS — I10 Essential (primary) hypertension: Secondary | ICD-10-CM | POA: Diagnosis present

## 2017-12-24 DIAGNOSIS — Z8249 Family history of ischemic heart disease and other diseases of the circulatory system: Secondary | ICD-10-CM | POA: Insufficient documentation

## 2017-12-24 DIAGNOSIS — Z79899 Other long term (current) drug therapy: Secondary | ICD-10-CM | POA: Insufficient documentation

## 2017-12-24 DIAGNOSIS — Z9889 Other specified postprocedural states: Secondary | ICD-10-CM | POA: Diagnosis not present

## 2017-12-24 DIAGNOSIS — E876 Hypokalemia: Secondary | ICD-10-CM | POA: Diagnosis present

## 2017-12-24 DIAGNOSIS — Z91013 Allergy to seafood: Secondary | ICD-10-CM | POA: Insufficient documentation

## 2017-12-24 LAB — BASIC METABOLIC PANEL
Anion gap: 12 (ref 5–15)
BUN: 21 mg/dL — ABNORMAL HIGH (ref 6–20)
CO2: 26 mmol/L (ref 22–32)
Calcium: 9.5 mg/dL (ref 8.9–10.3)
Chloride: 99 mmol/L (ref 98–111)
Creatinine, Ser: 1 mg/dL (ref 0.44–1.00)
GFR calc Af Amer: >60 mL/min (ref >60)
GFR calc non Af Amer: >60 mL/min (ref >60)
Glucose, Bld: 130 mg/dL — ABNORMAL HIGH (ref 70–99)
Potassium: 2.5 mmol/L — CL (ref 3.5–5.1)
Sodium: 137 mmol/L (ref 135–145)

## 2017-12-24 LAB — CBC
HCT: 42.4 % (ref 36.0–46.0)
Hemoglobin: 14.1 g/dL (ref 12.0–15.0)
MCH: 30.1 pg (ref 26.0–34.0)
MCHC: 33.3 g/dL (ref 30.0–36.0)
MCV: 90.4 fL (ref 78.0–100.0)
Platelets: 421 10*3/uL — ABNORMAL HIGH (ref 150–400)
RBC: 4.69 MIL/uL (ref 3.87–5.11)
RDW: 11.9 % (ref 11.5–15.5)
WBC: 9.2 10*3/uL (ref 4.0–10.5)

## 2017-12-24 LAB — URINALYSIS, ROUTINE W REFLEX MICROSCOPIC
Bilirubin Urine: NEGATIVE
Glucose, UA: NEGATIVE mg/dL
Hgb urine dipstick: NEGATIVE
Ketones, ur: NEGATIVE mg/dL
Leukocytes, UA: NEGATIVE
Nitrite: NEGATIVE
Protein, ur: NEGATIVE mg/dL
Specific Gravity, Urine: 1.018 (ref 1.005–1.030)
pH: 5 (ref 5.0–8.0)

## 2017-12-24 LAB — CBG MONITORING, ED: Glucose-Capillary: 119 mg/dL — ABNORMAL HIGH (ref 70–99)

## 2017-12-24 MED ORDER — POTASSIUM CHLORIDE IN NACL 20-0.9 MEQ/L-% IV SOLN
INTRAVENOUS | Status: DC
  Administered 2017-12-25: 01:00:00 via INTRAVENOUS
  Filled 2017-12-24: qty 1000

## 2017-12-24 MED ORDER — METOPROLOL SUCCINATE ER 50 MG PO TB24
50.0000 mg | ORAL_TABLET | Freq: Every morning | ORAL | Status: DC
  Administered 2017-12-25: 50 mg via ORAL
  Filled 2017-12-24: qty 1

## 2017-12-24 MED ORDER — AMLODIPINE BESYLATE 5 MG PO TABS
5.0000 mg | ORAL_TABLET | Freq: Every day | ORAL | Status: DC
  Administered 2017-12-25: 5 mg via ORAL
  Filled 2017-12-24: qty 1

## 2017-12-24 MED ORDER — ATORVASTATIN CALCIUM 20 MG PO TABS
20.0000 mg | ORAL_TABLET | Freq: Every day | ORAL | Status: DC
  Administered 2017-12-25 (×2): 20 mg via ORAL
  Filled 2017-12-24 (×2): qty 1

## 2017-12-24 MED ORDER — IRBESARTAN 150 MG PO TABS
150.0000 mg | ORAL_TABLET | Freq: Two times a day (BID) | ORAL | Status: DC
  Administered 2017-12-25 (×2): 150 mg via ORAL
  Filled 2017-12-24 (×3): qty 1

## 2017-12-24 MED ORDER — LINACLOTIDE 145 MCG PO CAPS: 290.0000 ug | ORAL_CAPSULE | Freq: Every day | ORAL | Status: DC | PRN

## 2017-12-24 MED ORDER — ACETAMINOPHEN 650 MG RE SUPP: 650.0000 mg | Freq: Four times a day (QID) | RECTAL | Status: DC | PRN

## 2017-12-24 MED ORDER — ONDANSETRON HCL 4 MG PO TABS
4.0000 mg | ORAL_TABLET | Freq: Four times a day (QID) | ORAL | Status: DC | PRN
Start: 2017-12-24 — End: 2017-12-25

## 2017-12-24 MED ORDER — POTASSIUM CHLORIDE CRYS ER 20 MEQ PO TBCR
60.0000 meq | EXTENDED_RELEASE_TABLET | Freq: Once | ORAL | Status: AC
  Administered 2017-12-24: 60 meq via ORAL
  Filled 2017-12-24: qty 3

## 2017-12-24 MED ORDER — POTASSIUM CHLORIDE 10 MEQ/100ML IV SOLN
10.0000 meq | INTRAVENOUS | Status: AC
  Administered 2017-12-24 (×2): 10 meq via INTRAVENOUS
  Filled 2017-12-24 (×3): qty 100

## 2017-12-24 MED ORDER — ENOXAPARIN SODIUM 40 MG/0.4ML ~~LOC~~ SOLN
40.0000 mg | Freq: Every day | SUBCUTANEOUS | Status: DC
  Administered 2017-12-25: 40 mg via SUBCUTANEOUS
  Filled 2017-12-24: qty 0.4

## 2017-12-24 MED ORDER — SERTRALINE HCL 50 MG PO TABS
50.0000 mg | ORAL_TABLET | Freq: Every day | ORAL | Status: DC
  Administered 2017-12-25: 50 mg via ORAL
  Filled 2017-12-24: qty 1

## 2017-12-24 MED ORDER — IBUPROFEN 400 MG PO TABS
400.0000 mg | ORAL_TABLET | Freq: Once | ORAL | Status: AC
  Administered 2017-12-24: 400 mg via ORAL
  Filled 2017-12-24: qty 1

## 2017-12-24 MED ORDER — ACETAMINOPHEN 325 MG PO TABS: 650.0000 mg | ORAL_TABLET | Freq: Four times a day (QID) | ORAL | Status: DC | PRN

## 2017-12-24 MED ORDER — ONDANSETRON HCL 4 MG/2ML IJ SOLN: 4.0000 mg | Freq: Four times a day (QID) | INTRAMUSCULAR | Status: DC | PRN

## 2017-12-24 NOTE — H&P (Addendum)
History and Physical    Mckenzie Sanford NIO:[id number] DOB: Aug 12, 1955 DOA: 12/24/2017  PCP: [name], MD  Patient coming from: Home.  Chief Complaint: Loss of consciousness.  HPI: Mckenzie Sanford is a 62 y.o. female with history of breast cancer in remission, hypertension, hyperlipidemia, IBS was brought to the ER after patient had an episode of loss of consciousness.  Patient was at her workplace when she started feeling dizzy and she stood up and suddenly she remembers next to this people was around her.  She fell onto her face and hurt her face.  Denies having any incontinence of urine or bowels.  Denies any tongue bite.  Denies any chest pain or palpitation now.  Last week patient had come to the ER with some chest pain and at the time ER physician attributed to musculoskeletal pain and discharged home on NSAIDs.  Patient had followed up with the PCP who had prescribed prednisone which she took last dose yesterday.  Has not had any nausea vomiting or diarrhea.  ED Course: In the ER patient was not orthostatic.  CT head neck and maxillofacial did not show anything acute.  EKG shows normal sinus rhythm with QTC of 388 ms.  Potassium was markedly low and was given replacement.  Patient admitted for further management of syncope.  Review of Systems: As per HPI, rest all negative.   Past Medical History:  Diagnosis Date  . Allergic rhinitis   . Anxiety   . Arthritis    in back  . Breast cancer ([place]) 03/12/14   right/invasive ductal carcinoma, DCIS  . Depression   . Difficulty sleeping    takes Lorazepam as needed  . HTN (hypertension)   . Hypercholesteremia   . IBS (irritable bowel syndrome)   . Radiation 11/23/14-01/07/15   Right breast  . Wears glasses     Past Surgical History:  Procedure Laterality Date  . BREAST SURGERY  11/15   lumpectomy RT breast  . COLONOSCOPY    . CYST EXCISION     right axilla  . DILATION AND CURETTAGE OF UTERUS    . PORT-A-CATH  REMOVAL N/A 03/12/2015   Procedure: REMOVAL PORT-A-CATH;  Surgeon: [name], MD;  Location: [facility];  Service: General;  Laterality: N/A;  . PORTACATH PLACEMENT Right 06/01/2014   Procedure: ULTRASOUND GUIDED INSERTION PORT-A-CATH;  Surgeon: [name], MD;  Location: [place];  Service: General;  Laterality: Right;  . RADIOACTIVE SEED GUIDED PARTIAL MASTECTOMY WITH AXILLARY SENTINEL LYMPH NODE BIOPSY Right 04/20/2014   Procedure: RIGHT BREAST RADIOACTIVE SEED GUIDED LUMPECTOMY WITH RIGHT AXILLARY SENTINEL LYMPH NODE BIOPSY;  Surgeon: [name], MD;  Location: [place];  Service: General;  Laterality: Right;  . TONSILLECTOMY       reports that she has never smoked. She has never used smokeless tobacco. She reports that she drinks alcohol. She reports that she does not use drugs.  Allergies  Allergen Reactions  . Shellfish Allergy Itching, Nausea And Vomiting and Swelling  . Ivp Dye [Iodinated Diagnostic Agents]   . Penicillins Rash    Has patient had a PCN reaction causing immediate rash, facial/tongue/throat swelling, SOB or lightheadedness with hypotension: Yes Has patient had a PCN reaction causing severe rash involving mucus membranes or skin necrosis: Yes Has patient had a PCN reaction that required hospitalization: Unknown Has patient had a PCN reaction occurring within the last 10 years: No If all of the above answers are "NO", then may proceed  with Cephalosporins Pt's mother told her this "a long time ago"    Family History  Problem Relation Age of Onset  . Heart attack Father   . Colon cancer Neg Hx   . Rectal cancer Neg Hx   . Stomach cancer Neg Hx   . Esophageal cancer Neg Hx   . Liver cancer Neg Hx     Prior to Admission medications   Medication Sig Start Date End Date Taking? Authorizing Provider  acetaminophen (TYLENOL 8 HOUR) 650 MG CR tablet Take 650 mg by mouth every 8 (eight) hours as needed for pain.    [provider]  amLODipine (NORVASC) 10 MG tablet Take 5 mg by mouth at bedtime.     [provider]  atorvastatin (LIPITOR) 20 MG tablet Take 20 mg by mouth daily.    [provider]  cholecalciferol (VITAMIN D) 1000 units tablet Take 1 tablet (1,000 Units total) by mouth daily. 05/15/17   [name], MD  [facility] Liver Oil 5000-500 UNIT/5ML OIL Take 5 mLs by mouth daily. 05/15/17   [name], MD  diphenhydrAMINE (BENADRYL) 25 mg capsule Take 25 mg by mouth every 6 (six) hours as needed for itching.     [provider]  exemestane (AROMASIN) 25 MG tablet TAKE 1 TABLET BY MOUTH DAILY AFTER BREAKFAST 02/19/17   [name], MD  hydrochlorothiazide (HYDRODIURIL) 25 MG tablet Take 25 mg by mouth daily. 10/07/17   [provider]  irbesartan (AVAPRO) 300 MG tablet Take 150 mg by mouth 2 (two) times daily.  04/26/14   [provider]  LINZESS 290 MCG CAPS capsule Take 290 mcg by mouth daily as needed (IBS).  07/06/14   [provider]  methocarbamol (ROBAXIN) 500 MG tablet Take 1 tablet (500 mg total) by mouth 4 (four) times daily. Patient taking differently: Take 750 mg by mouth every 8 (eight) hours as needed for muscle spasms.  05/15/17   [name], MD  metoprolol succinate (TOPROL-XL) 50 MG 24 hr tablet Take 50 mg by mouth every morning. Take with or immediately following a meal.    [provider]  naproxen (NAPROSYN) 250 MG tablet Take 250 mg by mouth daily as needed for mild pain.    [provider]  Polyethyl Glycol-Propyl Glycol (SYSTANE OP) Apply 1 drop to eye daily as needed (Dry eyes).    [provider]  polyethylene glycol (MIRALAX / GLYCOLAX) packet Take 17 g by mouth 2 (two) times daily as needed.    [provider]  sertraline (ZOLOFT) 50 MG tablet Take 1 tablet (50 mg total) by mouth daily. 05/15/17   [name], MD  sodium chloride (OCEAN) 0.65 % SOLN nasal spray Place 1 spray into both  nostrils as needed for congestion.    [provider]  sucralfate (CARAFATE) 1 g tablet Take 1 tablet (1 g total) by mouth 4 (four) times daily as needed. 12/17/17   Maudie Flakes, MD  vitamin C (ASCORBIC ACID) 250 MG tablet Take 1 tablet (250 mg total) by mouth daily. 05/15/17   [name], MD    Physical Exam: Vitals:   12/24/17 1628 12/24/17 1644  BP:  105/63  Pulse:  61  Resp:  14  Temp:  98.3 F (36.8 C)  TempSrc:  Oral  SpO2:  97%  Weight: 58.5 kg (129 lb)   Height: 5\' 2"  (1.575 m)       Constitutional: Moderately built and nourished. Vitals:  12/24/17 1628 12/24/17 1644  BP:  105/63  Pulse:  61  Resp:  14  Temp:  98.3 F (36.8 C)  TempSrc:  Oral  SpO2:  97%  Weight: 58.5 kg (129 lb)   Height: 5\' 2"  (1.575 m)    Eyes: Anicteric no pallor. ENMT: No discharge from the ears eyes nose or mouth. Neck: No mass felt.  No neck rigidity.  No JVD appreciated. Respiratory: No rhonchi or crepitations. Cardiovascular: S1-S2 heard no murmurs appreciated. Abdomen: Soft nontender bowel sounds present. Musculoskeletal: No edema.  No joint effusion. Skin: No rash.  Skin appears warm. Neurologic: Alert awake oriented to time place and person.  Moves all extremities 5 x 5.  No facial asymmetry tongue is midline. Psychiatric: Appears normal per normal affect.   Labs on Admission: I have personally reviewed following labs and imaging studies  CBC: Recent Labs  Lab 12/24/17 1643  WBC 9.2  HGB 14.1  HCT 42.4  MCV 90.4  PLT 295*   Basic Metabolic Panel: Recent Labs  Lab 12/24/17 1643  NA 137  K 2.5*  CL 99  CO2 26  GLUCOSE 130*  BUN 21*  CREATININE 1.00  CALCIUM 9.5   GFR: Estimated Creatinine Clearance: 47.9 mL/min (by C-G formula based on SCr of 1 mg/dL). Liver Function Tests: No results for input(s): AST, ALT, ALKPHOS, BILITOT, PROT, ALBUMIN in the last 168 hours. No results for input(s): LIPASE, AMYLASE in the last 168 hours. No results for  input(s): AMMONIA in the last 168 hours. Coagulation Profile: No results for input(s): INR, PROTIME in the last 168 hours. Cardiac Enzymes: No results for input(s): CKTOTAL, CKMB, CKMBINDEX, TROPONINI in the last 168 hours. BNP (last 3 results) No results for input(s): PROBNP in the last 8760 hours. HbA1C: No results for input(s): HGBA1C in the last 72 hours. CBG: Recent Labs  Lab 12/24/17 1913  GLUCAP 119*   Lipid Profile: No results for input(s): CHOL, HDL, LDLCALC, TRIG, CHOLHDL, LDLDIRECT in the last 72 hours. Thyroid Function Tests: No results for input(s): TSH, T4TOTAL, FREET4, T3FREE, THYROIDAB in the last 72 hours. Anemia Panel: No results for input(s): VITAMINB12, FOLATE, FERRITIN, TIBC, IRON, RETICCTPCT in the last 72 hours. Urine analysis:    Component Value Date/Time   COLORURINE YELLOW 12/24/2017 [place] 12/24/2017 1749   LABSPEC 1.018 12/24/2017 1749   PHURINE 5.0 12/24/2017 1749   GLUCOSEU NEGATIVE 12/24/2017 1749   HGBUR NEGATIVE 12/24/2017 1749   BILIRUBINUR NEGATIVE 12/24/2017 1749   BILIRUBINUR neg 12/11/2011 1132   KETONESUR NEGATIVE 12/24/2017 1749   PROTEINUR NEGATIVE 12/24/2017 1749   UROBILINOGEN 0.2 12/11/2011 1132   UROBILINOGEN 0.2 06/16/2009 2232   NITRITE NEGATIVE 12/24/2017 1749   LEUKOCYTESUR NEGATIVE 12/24/2017 1749   Sepsis Labs: @LABRCNTIP (procalcitonin:4,lacticidven:4) )No results found for this or any previous visit (from the past 240 hour(s)).   Radiological Exams on Admission: Ct Head Wo Contrast  Result Date: 12/24/2017 CLINICAL DATA:  Syncope leading to fall striking face. Head pain. C-spine trauma, high clinical risk (NEXUS/CCR); Maxface trauma blunt EXAM: CT HEAD WITHOUT CONTRAST CT MAXILLOFACIAL WITHOUT CONTRAST CT CERVICAL SPINE WITHOUT CONTRAST TECHNIQUE: Multidetector CT imaging of the head, cervical spine, and maxillofacial structures were performed using the standard protocol without intravenous contrast.  Multiplanar CT image reconstructions of the cervical spine and maxillofacial structures were also generated. COMPARISON:  Head CT 06/16/2009 FINDINGS: CT HEAD FINDINGS Brain: Brain volume is normal for age. No intracranial hemorrhage, mass effect, or midline shift. No hydrocephalus. The  basilar cisterns are patent. No evidence of territorial infarct or acute ischemia. No extra-axial or intracranial fluid collection. Vascular: No hyperdense vessel or unexpected calcification. Skull: No fracture or focal lesion. Other: None. CT MAXILLOFACIAL FINDINGS Osseous: Nasal bone, zygomatic arches, and mandibles are intact. The temporomandibular joints are congruent. Orbits: No orbital fracture.  Both orbits and globes are intact. Sinuses: Clear.  No sinus fracture or fluid level. Soft tissues: Negative. CT CERVICAL SPINE FINDINGS Alignment: Slight straightening of normal lordosis. No traumatic subluxation. Skull base and vertebrae: No acute fracture. Vertebral body heights are maintained. The dens and skull base are intact. Soft tissues and spinal canal: No prevertebral fluid or swelling. No visible canal hematoma. Disc levels: Disc space narrowing and endplate spurring at CT C3, C5-C6 and C6-C7. Mild scattered facet arthropathy. Upper chest: Negative. Other: None. IMPRESSION: 1. Negative head CT. 2. Negative maxillofacial CT. 3. Straightening of normal cervical lordosis typically seen with muscle spasm or positioning. No cervical spine fracture. Electronically Signed   By: Jeb Levering M.D.   On: 12/24/2017 21:19   Ct Cervical Spine Wo Contrast  Result Date: 12/24/2017 CLINICAL DATA:  Syncope leading to fall striking face. Head pain. C-spine trauma, high clinical risk (NEXUS/CCR); Maxface trauma blunt EXAM: CT HEAD WITHOUT CONTRAST CT MAXILLOFACIAL WITHOUT CONTRAST CT CERVICAL SPINE WITHOUT CONTRAST TECHNIQUE: Multidetector CT imaging of the head, cervical spine, and maxillofacial structures were performed using the  standard protocol without intravenous contrast. Multiplanar CT image reconstructions of the cervical spine and maxillofacial structures were also generated. COMPARISON:  Head CT 06/16/2009 FINDINGS: CT HEAD FINDINGS Brain: Brain volume is normal for age. No intracranial hemorrhage, mass effect, or midline shift. No hydrocephalus. The basilar cisterns are patent. No evidence of territorial infarct or acute ischemia. No extra-axial or intracranial fluid collection. Vascular: No hyperdense vessel or unexpected calcification. Skull: No fracture or focal lesion. Other: None. CT MAXILLOFACIAL FINDINGS Osseous: Nasal bone, zygomatic arches, and mandibles are intact. The temporomandibular joints are congruent. Orbits: No orbital fracture.  Both orbits and globes are intact. Sinuses: Clear.  No sinus fracture or fluid level. Soft tissues: Negative. CT CERVICAL SPINE FINDINGS Alignment: Slight straightening of normal lordosis. No traumatic subluxation. Skull base and vertebrae: No acute fracture. Vertebral body heights are maintained. The dens and skull base are intact. Soft tissues and spinal canal: No prevertebral fluid or swelling. No visible canal hematoma. Disc levels: Disc space narrowing and endplate spurring at CT C3, C5-C6 and C6-C7. Mild scattered facet arthropathy. Upper chest: Negative. Other: None. IMPRESSION: 1. Negative head CT. 2. Negative maxillofacial CT. 3. Straightening of normal cervical lordosis typically seen with muscle spasm or positioning. No cervical spine fracture. Electronically Signed   By: Jeb Levering M.D.   On: 12/24/2017 21:19   Ct Maxillofacial Wo Contrast  Result Date: 12/24/2017 CLINICAL DATA:  Syncope leading to fall striking face. Head pain. C-spine trauma, high clinical risk (NEXUS/CCR); Maxface trauma blunt EXAM: CT HEAD WITHOUT CONTRAST CT MAXILLOFACIAL WITHOUT CONTRAST CT CERVICAL SPINE WITHOUT CONTRAST TECHNIQUE: Multidetector CT imaging of the head, cervical spine, and  maxillofacial structures were performed using the standard protocol without intravenous contrast. Multiplanar CT image reconstructions of the cervical spine and maxillofacial structures were also generated. COMPARISON:  Head CT 06/16/2009 FINDINGS: CT HEAD FINDINGS Brain: Brain volume is normal for age. No intracranial hemorrhage, mass effect, or midline shift. No hydrocephalus. The basilar cisterns are patent. No evidence of territorial infarct or acute ischemia. No extra-axial or intracranial fluid collection. Vascular: No hyperdense  vessel or unexpected calcification. Skull: No fracture or focal lesion. Other: None. CT MAXILLOFACIAL FINDINGS Osseous: Nasal bone, zygomatic arches, and mandibles are intact. The temporomandibular joints are congruent. Orbits: No orbital fracture.  Both orbits and globes are intact. Sinuses: Clear.  No sinus fracture or fluid level. Soft tissues: Negative. CT CERVICAL SPINE FINDINGS Alignment: Slight straightening of normal lordosis. No traumatic subluxation. Skull base and vertebrae: No acute fracture. Vertebral body heights are maintained. The dens and skull base are intact. Soft tissues and spinal canal: No prevertebral fluid or swelling. No visible canal hematoma. Disc levels: Disc space narrowing and endplate spurring at CT C3, C5-C6 and C6-C7. Mild scattered facet arthropathy. Upper chest: Negative. Other: None. IMPRESSION: 1. Negative head CT. 2. Negative maxillofacial CT. 3. Straightening of normal cervical lordosis typically seen with muscle spasm or positioning. No cervical spine fracture. Electronically Signed   By: Jeb Levering M.D.   On: 12/24/2017 21:19    EKG: Independently reviewed.  Normal signs and QTC of 388 ms.  Assessment/Plan Active Problems:   Hypertension   Breast cancer of upper-outer quadrant of right female breast (HCC)   Syncope   Hypokalemia    1. Syncope -cause not clear patient was not orthostatic.  Has had some chest pain last week  which was attributed to musculoskeletal and was given Naprosyn.  Patient also took a course of steroids given to her by her PCP.  Since patient has history of breast cancer in remission and recent chest pain will check d-dimer.  Closely monitor in telemetry for any arrhythmias.  Replace potassium recheck.  Patient is on Robaxin which patient states she is on for long time. 2. Hypokalemia probably attributed to hydrochlorothiazide.  Replace recheck hold hydrochlorothiazide until potassium corrected.  Denies any diarrhea or vomiting.  If hypokalemia persists may need further work-up as outpatient. 3. Hypertension on Toprol ARB amlodipine.  Hydrochlorothiazide on hold due to hypokalemia.  PRN IV hydralazine. 4. Breast cancer in remission being followed by Dr. Leland Johns. 5. Hyperlipidemia on statins. 6. IBS on Linzess. 7. Depression on Zoloft.   DVT prophylaxis: Lovenox. Code Status: Full code. Family Communication: Discussed with patient. Disposition Plan: Home. Consults called: None. Admission status: Observation.   Rise Patience MD [facility] Pager [phone].  If 7PM-7AM, please contact night-coverage [email or web address] Password [facility]  12/24/2017, 10:06 PM

## 2017-12-24 NOTE — ED Provider Notes (Signed)
[facility] EMERGENCY DEPARTMENT Provider Note   CSN: [id number] Arrival date & time: 12/24/17  1626     History   Chief Complaint Chief Complaint  Patient presents with  . Loss of Consciousness    HPI Mckenzie Sanford is a 62 y.o. female.  HPI   62 year old female with syncope.  Happened while at work shortly before arrival.  She was speaking with a coworker when she began to feel dizzy.  She went to get up and passed out.  She fell forward and struck her face.  Sounds like she briefly lost consciousness.  She states that her next memory was not until sometime later  Past Medical History:  Diagnosis Date  . Allergic rhinitis   . Anxiety   . Arthritis    in back  . 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 Depression   . Difficulty sleeping    takes Lorazepam as needed  . HTN (hypertension)   . Hypercholesteremia   . IBS (irritable bowel syndrome)   . Radiation 11/23/14-01/07/15   Right breast  . Wears glasses     Patient Active Problem List   Diagnosis Date Noted  . Hypotension 05/18/2015  . Fatigue 05/18/2015  . Nausea with vomiting 07/23/2014  . Dehydration 07/23/2014  . GERD (gastroesophageal reflux disease) 07/23/2014  . Neutropenia ([place]) 07/23/2014  . Breast cancer of upper-outer quadrant of right female breast ([place]) 03/20/2014  . Palpitations 03/26/2013  . Hypertension 07/02/2011  . Hyperlipidemia 07/02/2011  . CONSTIPATION 01/22/2009  . FLATULENCE-GAS-BLOATING 01/22/2009    Past Surgical History:  Procedure Laterality Date  . BREAST SURGERY  11/15   lumpectomy RT breast  . COLONOSCOPY    . CYST EXCISION     right axilla  . DILATION AND CURETTAGE OF UTERUS    . PORT-A-CATH REMOVAL N/A 03/12/2015   Procedure: REMOVAL PORT-A-CATH;  Surgeon: [name], MD;  Location: [facility];  Service: General;  Laterality: N/A;  . PORTACATH PLACEMENT Right 06/01/2014   Procedure: ULTRASOUND GUIDED INSERTION  PORT-A-CATH;  Surgeon: [name], MD;  Location: [facility];  Service: General;  Laterality: Right;  . RADIOACTIVE SEED GUIDED PARTIAL MASTECTOMY WITH AXILLARY SENTINEL LYMPH NODE BIOPSY Right 04/20/2014   Procedure: RIGHT BREAST RADIOACTIVE SEED GUIDED LUMPECTOMY WITH RIGHT AXILLARY SENTINEL LYMPH NODE BIOPSY;  Surgeon: [name], MD;  Location: [facility];  Service: General;  Laterality: Right;  . TONSILLECTOMY       OB History   None      Home Medications    Prior to Admission medications   Medication Sig Start Date End Date Taking? Authorizing Provider  acetaminophen (TYLENOL 8 HOUR) 650 MG CR tablet Take 650 mg by mouth every 8 (eight) hours as needed for pain.    [provider]  amLODipine (NORVASC) 10 MG tablet Take 5 mg by mouth at bedtime.     [provider]  atorvastatin (LIPITOR) 20 MG tablet Take 20 mg by mouth daily.    [provider]  cholecalciferol (VITAMIN D) 1000 units tablet Take 1 tablet (1,000 Units total) by mouth daily. 05/15/17   [name], MD  [facility] Liver Oil 5000-500 UNIT/5ML OIL Take 5 mLs by mouth daily. 05/15/17   [name], MD  diphenhydrAMINE (BENADRYL) 25 mg capsule Take 25 mg by mouth every 6 (six) hours as needed for itching.     [provider]  exemestane (AROMASIN) 25 MG tablet TAKE  1 TABLET BY MOUTH DAILY AFTER BREAKFAST 02/19/17   [name], MD  hydrochlorothiazide (HYDRODIURIL) 25 MG tablet Take 25 mg by mouth daily. 10/07/17   [provider]  irbesartan (AVAPRO) 300 MG tablet Take 150 mg by mouth 2 (two) times daily.  04/26/14   [provider]  LINZESS 290 MCG CAPS capsule Take 290 mcg by mouth daily as needed (IBS).  07/06/14   [provider]  methocarbamol (ROBAXIN) 500 MG tablet Take 1 tablet (500 mg total) by mouth 4 (four) times daily. Patient taking differently: Take 750 mg by mouth every 8 (eight) hours as needed for muscle  spasms.  05/15/17   [name], MD  metoprolol succinate (TOPROL-XL) 50 MG 24 hr tablet Take 50 mg by mouth every morning. Take with or immediately following a meal.    [provider]  naproxen (NAPROSYN) 250 MG tablet Take 250 mg by mouth daily as needed for mild pain.    [provider]  Polyethyl Glycol-Propyl Glycol (SYSTANE OP) Apply 1 drop to eye daily as needed (Dry eyes).    [provider]  polyethylene glycol (MIRALAX / GLYCOLAX) packet Take 17 g by mouth 2 (two) times daily as needed.    [provider]  sertraline (ZOLOFT) 50 MG tablet Take 1 tablet (50 mg total) by mouth daily. 05/15/17   [name], MD  sodium chloride (OCEAN) 0.65 % SOLN nasal spray Place 1 spray into both nostrils as needed for congestion.    [provider]  sucralfate (CARAFATE) 1 g tablet Take 1 tablet (1 g total) by mouth 4 (four) times daily as needed. 12/17/17   Maudie Flakes, MD  vitamin C (ASCORBIC ACID) 250 MG tablet Take 1 tablet (250 mg total) by mouth daily. 05/15/17   [name], MD    Family History Family History  Problem Relation Age of Onset  . Heart attack Father   . Colon cancer Neg Hx   . Rectal cancer Neg Hx   . Stomach cancer Neg Hx   . Esophageal cancer Neg Hx   . Liver cancer Neg Hx     Social History Social History   Tobacco Use  . Smoking status: Never Smoker  . Smokeless tobacco: Never Used  Substance Use Topics  . Alcohol use: Yes    Alcohol/week: 0.0 oz    Comment: social  . Drug use: No     Allergies   Shellfish allergy; Ivp dye [iodinated diagnostic agents]; and Penicillins   Review of Systems Review of Systems  All systems reviewed and negative, other than as noted in HPI.  Physical Exam Updated Vital Signs BP 105/63 (BP Location: Left Arm)   Pulse 61   Temp 98.3 F (36.8 C) (Oral)   Resp 14   Ht 5\' 2"  (1.575 m)   Wt 58.5 kg (129 lb)   LMP 09/26/2011   SpO2 97%   BMI 23.59 kg/m   Physical  Exam  Constitutional: She is oriented to person, place, and time. She appears well-developed and well-nourished. No distress.  HENT:  Head: Normocephalic.  Superficial abrasions to the face.  Mild tenderness across the bridge of the nose.  No epistaxis or septal hematoma.  Eyes: Pupils are equal, round, and reactive to light. Conjunctivae and EOM are normal. Right eye exhibits no discharge. Left eye exhibits no discharge.  Neck: Neck supple.  Cardiovascular: Normal rate, regular rhythm and normal heart sounds. Exam reveals no gallop and no friction rub.  No murmur heard. Pulmonary/Chest: Effort normal and breath sounds normal. No respiratory distress.  Abdominal: Soft. She exhibits no distension. There is no tenderness.  Musculoskeletal: She exhibits no edema or tenderness.  No midline spinal tenderness  Neurological: She is alert and oriented to person, place, and time. No cranial nerve deficit. She exhibits normal muscle tone. Coordination normal.  Skin: Skin is warm and dry.  Psychiatric: She has a normal mood and affect. Her behavior is normal. Thought content normal.  Nursing note and vitals reviewed.    ED Treatments / Results  Labs (all labs ordered are listed, but only abnormal results are displayed) Labs Reviewed  BASIC METABOLIC PANEL - Abnormal; Notable for the following components:      Result Value   Potassium 2.5 (*)    Glucose, Bld 130 (*)    BUN 21 (*)    All other components within normal limits  CBC - Abnormal; Notable for the following components:   Platelets 421 (*)    All other components within normal limits  CBG MONITORING, ED - Abnormal; Notable for the following components:   Glucose-Capillary 119 (*)    All other components within normal limits  URINALYSIS, ROUTINE W REFLEX MICROSCOPIC  I-STAT BETA HCG BLOOD, ED ([facility], [facility], [facility] ONLY)    EKG EKG Interpretation  Date/Time:  Monday December 24 2017 16:41:05 EDT Ventricular Rate:  60 PR Interval:  158 QRS  Duration: 86 QT Interval:  388 QTC Calculation: 388 R Axis:   49 Text Interpretation:  Normal sinus rhythm Nonspecific T wave abnormality Abnormal ECG Confirmed by Virgel Manifold [phone]) on 12/24/2017 6:43:36 PM   Radiology Ct Head Wo Contrast  Result Date: 12/24/2017 CLINICAL DATA:  Syncope leading to fall striking face. Head pain. C-spine trauma, high clinical risk (NEXUS/CCR); Maxface trauma blunt EXAM: CT HEAD WITHOUT CONTRAST CT MAXILLOFACIAL WITHOUT CONTRAST CT CERVICAL SPINE WITHOUT CONTRAST TECHNIQUE: Multidetector CT imaging of the head, cervical spine, and maxillofacial structures were performed using the standard protocol without intravenous contrast. Multiplanar CT image reconstructions of the cervical spine and maxillofacial structures were also generated. COMPARISON:  Head CT 06/16/2009 FINDINGS: CT HEAD FINDINGS Brain: Brain volume is normal for age. No intracranial hemorrhage, mass effect, or midline shift. No hydrocephalus. The basilar cisterns are patent. No evidence of territorial infarct or acute ischemia. No extra-axial or intracranial fluid collection. Vascular: No hyperdense vessel or unexpected calcification. Skull: No fracture or focal lesion. Other: None. CT MAXILLOFACIAL FINDINGS Osseous: Nasal bone, zygomatic arches, and mandibles are intact. The temporomandibular joints are congruent. Orbits: No orbital fracture.  Both orbits and globes are intact. Sinuses: Clear.  No sinus fracture or fluid level. Soft tissues: Negative. CT CERVICAL SPINE FINDINGS Alignment: Slight straightening of normal lordosis. No traumatic subluxation. Skull base and vertebrae: No acute fracture. Vertebral body heights are maintained. The dens and skull base are intact. Soft tissues and spinal canal: No prevertebral fluid or swelling. No visible canal hematoma. Disc levels: Disc space narrowing and endplate spurring at CT C3, C5-C6 and C6-C7. Mild scattered facet arthropathy. Upper chest: Negative. Other:  None. IMPRESSION: 1. Negative head CT. 2. Negative maxillofacial CT. 3. Straightening of normal cervical lordosis typically seen with muscle spasm or positioning. No cervical spine fracture. Electronically Signed   By: Jeb Levering M.D.   On: 12/24/2017 21:19   Ct Cervical Spine Wo Contrast  Result Date: 12/24/2017 CLINICAL DATA:  Syncope leading to fall striking face. Head pain. C-spine trauma, high clinical risk (NEXUS/CCR); Maxface trauma  blunt EXAM: CT HEAD WITHOUT CONTRAST CT MAXILLOFACIAL WITHOUT CONTRAST CT CERVICAL SPINE WITHOUT CONTRAST TECHNIQUE: Multidetector CT imaging of the head, cervical spine, and maxillofacial structures were performed using the standard protocol without intravenous contrast. Multiplanar CT image reconstructions of the cervical spine and maxillofacial structures were also generated. COMPARISON:  Head CT 06/16/2009 FINDINGS: CT HEAD FINDINGS Brain: Brain volume is normal for age. No intracranial hemorrhage, mass effect, or midline shift. No hydrocephalus. The basilar cisterns are patent. No evidence of territorial infarct or acute ischemia. No extra-axial or intracranial fluid collection. Vascular: No hyperdense vessel or unexpected calcification. Skull: No fracture or focal lesion. Other: None. CT MAXILLOFACIAL FINDINGS Osseous: Nasal bone, zygomatic arches, and mandibles are intact. The temporomandibular joints are congruent. Orbits: No orbital fracture.  Both orbits and globes are intact. Sinuses: Clear.  No sinus fracture or fluid level. Soft tissues: Negative. CT CERVICAL SPINE FINDINGS Alignment: Slight straightening of normal lordosis. No traumatic subluxation. Skull base and vertebrae: No acute fracture. Vertebral body heights are maintained. The dens and skull base are intact. Soft tissues and spinal canal: No prevertebral fluid or swelling. No visible canal hematoma. Disc levels: Disc space narrowing and endplate spurring at CT C3, C5-C6 and C6-C7. Mild scattered  facet arthropathy. Upper chest: Negative. Other: None. IMPRESSION: 1. Negative head CT. 2. Negative maxillofacial CT. 3. Straightening of normal cervical lordosis typically seen with muscle spasm or positioning. No cervical spine fracture. Electronically Signed   By: Jeb Levering M.D.   On: 12/24/2017 21:19   Ct Maxillofacial Wo Contrast  Result Date: 12/24/2017 CLINICAL DATA:  Syncope leading to fall striking face. Head pain. C-spine trauma, high clinical risk (NEXUS/CCR); Maxface trauma blunt EXAM: CT HEAD WITHOUT CONTRAST CT MAXILLOFACIAL WITHOUT CONTRAST CT CERVICAL SPINE WITHOUT CONTRAST TECHNIQUE: Multidetector CT imaging of the head, cervical spine, and maxillofacial structures were performed using the standard protocol without intravenous contrast. Multiplanar CT image reconstructions of the cervical spine and maxillofacial structures were also generated. COMPARISON:  Head CT 06/16/2009 FINDINGS: CT HEAD FINDINGS Brain: Brain volume is normal for age. No intracranial hemorrhage, mass effect, or midline shift. No hydrocephalus. The basilar cisterns are patent. No evidence of territorial infarct or acute ischemia. No extra-axial or intracranial fluid collection. Vascular: No hyperdense vessel or unexpected calcification. Skull: No fracture or focal lesion. Other: None. CT MAXILLOFACIAL FINDINGS Osseous: Nasal bone, zygomatic arches, and mandibles are intact. The temporomandibular joints are congruent. Orbits: No orbital fracture.  Both orbits and globes are intact. Sinuses: Clear.  No sinus fracture or fluid level. Soft tissues: Negative. CT CERVICAL SPINE FINDINGS Alignment: Slight straightening of normal lordosis. No traumatic subluxation. Skull base and vertebrae: No acute fracture. Vertebral body heights are maintained. The dens and skull base are intact. Soft tissues and spinal canal: No prevertebral fluid or swelling. No visible canal hematoma. Disc levels: Disc space narrowing and endplate  spurring at CT C3, C5-C6 and C6-C7. Mild scattered facet arthropathy. Upper chest: Negative. Other: None. IMPRESSION: 1. Negative head CT. 2. Negative maxillofacial CT. 3. Straightening of normal cervical lordosis typically seen with muscle spasm or positioning. No cervical spine fracture. Electronically Signed   By: Jeb Levering M.D.   On: 12/24/2017 21:19    Procedures Procedures (including critical care time)  Medications Ordered in ED Medications  potassium chloride 10 mEq in 100 mL IVPB (0 mEq Intravenous Stopped 12/24/17 2038)  potassium chloride SA (K-DUR,KLOR-CON) CR tablet 60 mEq (60 mEq Oral Given 12/24/17 1941)     Initial Impression /  Assessment and Plan / ED Course  I have reviewed the triage vital signs and the nursing notes.  Pertinent labs & imaging results that were available during my care of the patient were reviewed by me and considered in my medical decision making (see chart for details).     61yF with syncope. Unclear etiology. Will admit for further eval. Hypokalemia. Begin repletion.   Final Clinical Impressions(s) / ED Diagnoses   Final diagnoses:  Syncope, unspecified syncope type  Hypokalemia    ED Discharge Orders    None       Virgel Manifold, MD 12/28/17 1502

## 2017-12-24 NOTE — ED Triage Notes (Signed)
Patient here by EMS for Syncope and a fall.  Hit face, C Collar applied.  Complains of head pain.  Dizziness almost subsided.

## 2017-12-25 ENCOUNTER — Observation Stay (HOSPITAL_BASED_OUTPATIENT_CLINIC_OR_DEPARTMENT_OTHER): Payer: BLUE CROSS/BLUE SHIELD

## 2017-12-25 ENCOUNTER — Encounter (HOSPITAL_COMMUNITY): Payer: Self-pay | Admitting: Cardiology

## 2017-12-25 DIAGNOSIS — R55 Syncope and collapse: Secondary | ICD-10-CM | POA: Diagnosis not present

## 2017-12-25 DIAGNOSIS — I361 Nonrheumatic tricuspid (valve) insufficiency: Secondary | ICD-10-CM

## 2017-12-25 DIAGNOSIS — E876 Hypokalemia: Secondary | ICD-10-CM

## 2017-12-25 DIAGNOSIS — I10 Essential (primary) hypertension: Secondary | ICD-10-CM

## 2017-12-25 LAB — BASIC METABOLIC PANEL
ANION GAP: 8 (ref 5–15)
BUN: 15 mg/dL (ref 6–20)
CHLORIDE: 106 mmol/L (ref 98–111)
CO2: 27 mmol/L (ref 22–32)
Calcium: 8.9 mg/dL (ref 8.9–10.3)
Creatinine, Ser: 0.82 mg/dL (ref 0.44–1.00)
GFR calc Af Amer: >60 mL/min (ref >60)
GLUCOSE: 96 mg/dL (ref 70–99)
POTASSIUM: 3.8 mmol/L (ref 3.5–5.1)
SODIUM: 141 mmol/L (ref 135–145)

## 2017-12-25 LAB — ECHOCARDIOGRAM COMPLETE
Height: 62 in
Weight: 2126.4 oz

## 2017-12-25 LAB — HEPATIC FUNCTION PANEL
ALK PHOS: 77 U/L (ref 38–126)
ALT: 45 U/L — ABNORMAL HIGH (ref 0–44)
AST: 22 U/L (ref 15–41)
Albumin: 3.3 g/dL — ABNORMAL LOW (ref 3.5–5.0)
BILIRUBIN TOTAL: 0.4 mg/dL (ref 0.3–1.2)
Total Protein: 6 g/dL — ABNORMAL LOW (ref 6.5–8.1)

## 2017-12-25 LAB — CBC WITH DIFFERENTIAL/PLATELET
Abs Immature Granulocytes: 0 10*3/uL (ref 0.0–0.1)
Basophils Absolute: 0.1 10*3/uL (ref 0.0–0.1)
Basophils Relative: 1 %
EOS ABS: 0.1 10*3/uL (ref 0.0–0.7)
EOS PCT: 2 %
HEMATOCRIT: 40.5 % (ref 36.0–46.0)
Hemoglobin: 13.5 g/dL (ref 12.0–15.0)
IMMATURE GRANULOCYTES: 0 %
LYMPHS ABS: 2.7 10*3/uL (ref 0.7–4.0)
Lymphocytes Relative: 35 %
MCH: 30.4 pg (ref 26.0–34.0)
MCHC: 33.3 g/dL (ref 30.0–36.0)
MCV: 91.2 fL (ref 78.0–100.0)
MONOS PCT: 10 %
Monocytes Absolute: 0.8 10*3/uL (ref 0.1–1.0)
NEUTROS PCT: 52 %
Neutro Abs: 4.1 10*3/uL (ref 1.7–7.7)
Platelets: 344 10*3/uL (ref 150–400)
RBC: 4.44 MIL/uL (ref 3.87–5.11)
RDW: 12 % (ref 11.5–15.5)
WBC: 7.8 10*3/uL (ref 4.0–10.5)

## 2017-12-25 LAB — D-DIMER, QUANTITATIVE (NOT AT ARMC): D DIMER QUANT: 0.39 ug{FEU}/mL (ref 0.00–0.50)

## 2017-12-25 LAB — MAGNESIUM: Magnesium: 2 mg/dL (ref 1.7–2.4)

## 2017-12-25 LAB — HIV ANTIBODY (ROUTINE TESTING W REFLEX): HIV Screen 4th Generation wRfx: NONREACTIVE

## 2017-12-25 LAB — TROPONIN I: Troponin I: <0.03 ng/mL (ref <0.03)

## 2017-12-25 MED ORDER — METHOCARBAMOL 500 MG PO TABS: 750.0000 mg | ORAL_TABLET | Freq: Three times a day (TID) | ORAL | Status: DC | PRN

## 2017-12-25 MED ORDER — IBUPROFEN 200 MG PO TABS
400.0000 mg | ORAL_TABLET | Freq: Once | ORAL | Status: AC
  Administered 2017-12-25: 400 mg via ORAL
  Filled 2017-12-25: qty 2

## 2017-12-25 MED ORDER — HYDRALAZINE HCL 20 MG/ML IJ SOLN: 5.0000 mg | INTRAMUSCULAR | Status: DC | PRN

## 2017-12-25 NOTE — Progress Notes (Signed)
  Echocardiogram 2D Echocardiogram has been performed.  Johny Chess 12/25/2017, 9:51 AM

## 2017-12-25 NOTE — Progress Notes (Signed)
RN rounded on pt. RN assisted pt to bathroom. Pt states she does not need anything else at this time.

## 2017-12-25 NOTE — Consult Note (Addendum)
Cardiology Consultation:   Patient ID: Mckenzie Sanford; [id number]; 10-20-1955   Admit date: 12/24/2017 Date of Consult: 12/25/2017  Primary Care Provider: [name], MD Primary Cardiologist: Dr. Marlou Porch- 2014  Patient Profile:   Mckenzie Sanford is a 62 y.o. female with a hx of breast cancer in remission, HTN, HLD, IBS who is being seen today for the evaluation of syncope and chest pain at the request of Dr. Cruzita Lederer.  History of Present Illness:   Mckenzie Sanford was seen in 02/2013 by Dr. Marlou Porch for hypertension with BP in the 200's after dental cleaning. He noted that she has a history of low risk exercise treadmill test in 03/2008 and a normal EF by echo in 2006.  Her BP was under good control at the appointment. She also has a history of palpitations well controlled on metoprolol.   She was seen in the ED on 12/17/17 for evaluation of chest pain and some shortness of breath that was not exertional. Troponin and d-dimer were not elevated. Her symptoms were felt to be musculoskeletal in nature. She was discharged with carafate and followed up with her PCP who started nexium and a steroid. Her chest pain improved. 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   She returned yesterday, 12/24/17, after an episode of loss of consciousness. She had gone back to work as her chest pain was improved. She had had a normal breakfast and lunch. She was outside with the children sitting in the shade when she developed dizziness. She went to get up and walk around to clear her head and she lost consciousness apparently falling onto the concrete. She does not recall any chest discomfort, shortness of breath or palpitations prior to or surrounding the event. EMS was called and she was aware of voices and answering questions but she felt that she was not totally aware until reaching the ED. She had a similar episode once over 20 years ago.  Until  this event she was active with no exertional chest discomfort or shortness of breath. She has never smoked and only drinks alcohol on a rare occasion. She has family history significant for MI in her Mother in her 7's, father dying of MI and maternal grandmother dying of MI and stroke at age 68.   Troponin was negative. Electrolytes showed hypokalemia with K+ 2.5 which has now been normalized. Renal function is normal. D-dimer is not elevated, 0.39. Chest xray without abnormalities. EKG without ischemic changes.   Past Medical History:  Diagnosis Date  . Allergic rhinitis   . Anxiety   . Arthritis    in back  . Breast cancer ([place]) 03/12/14   right/invasive ductal carcinoma, DCIS  . Depression   . Difficulty sleeping    takes Lorazepam as needed  . HTN (hypertension)   . Hypercholesteremia   . IBS (irritable bowel syndrome)   . Radiation 11/23/14-01/07/15   Right breast  . Wears glasses     Past Surgical History:  Procedure Laterality Date  . BREAST SURGERY  11/15   lumpectomy RT breast  . COLONOSCOPY    . CYST EXCISION     right axilla  . DILATION AND CURETTAGE OF UTERUS    . PORT-A-CATH REMOVAL N/A 03/12/2015   Procedure: REMOVAL PORT-A-CATH;  Surgeon: [name], MD;  Location: [facility];  Service: General;  Laterality: N/A;  . PORTACATH PLACEMENT Right 06/01/2014   Procedure: ULTRASOUND GUIDED  INSERTION PORT-A-CATH;  Surgeon: [name], MD;  Location: [facility];  Service: General;  Laterality: Right;  . RADIOACTIVE SEED GUIDED PARTIAL MASTECTOMY WITH AXILLARY SENTINEL LYMPH NODE BIOPSY Right 04/20/2014   Procedure: RIGHT BREAST RADIOACTIVE SEED GUIDED LUMPECTOMY WITH RIGHT AXILLARY SENTINEL LYMPH NODE BIOPSY;  Surgeon: [name], MD;  Location: [facility];  Service: General;  Laterality: Right;  . TONSILLECTOMY       Home Medications:  Prior to Admission medications   Medication Sig Start Date End Date Taking? Authorizing Provider   acetaminophen (TYLENOL 8 HOUR) 650 MG CR tablet Take 650 mg by mouth every 8 (eight) hours as needed for pain.   Yes [provider]  amLODipine (NORVASC) 10 MG tablet Take 5 mg by mouth at bedtime.    Yes [provider]  atorvastatin (LIPITOR) 20 MG tablet Take 20 mg by mouth at bedtime.    Yes [provider]  Biotin 2500 MCG CAPS Take 2,500 mcg by mouth daily.   Yes [provider]  cholecalciferol (VITAMIN D) 1000 units tablet Take 1 tablet (1,000 Units total) by mouth daily. Patient taking differently: Take 1,000 Units by mouth at bedtime.  05/15/17  Yes [name], MD  [facility] Liver Oil 5000-500 UNIT/5ML OIL Take 5 mLs by mouth daily. Patient taking differently: Take 5 mLs by mouth daily as needed (immune system boost).  05/15/17  Yes [name], MD  diphenhydrAMINE (BENADRYL) 25 mg capsule Take 25 mg by mouth every 6 (six) hours as needed for itching.    Yes [provider]  esomeprazole (NEXIUM) 20 MG capsule Take 20 mg by mouth daily at 12 noon.   Yes [provider]  exemestane (AROMASIN) 25 MG tablet TAKE 1 TABLET BY MOUTH DAILY AFTER BREAKFAST 02/19/17  Yes [name], MD  hydrochlorothiazide (HYDRODIURIL) 25 MG tablet Take 25 mg by mouth daily. 10/07/17  Yes [provider]  irbesartan (AVAPRO) 300 MG tablet Take 150 mg by mouth 2 (two) times daily.  04/26/14  Yes [provider]  linaclotide (LINZESS) 290 MCG CAPS capsule Take 290 mcg by mouth daily as needed (IBS symptoms).   Yes [provider]  Melatonin 3 MG TABS Take 3 mg by mouth at bedtime as needed (sleep).   Yes [provider]  methocarbamol (ROBAXIN) 750 MG tablet Take 750 mg by mouth every 8 (eight) hours as needed for muscle spasms.   Yes [provider]  metoprolol succinate (TOPROL-XL) 50 MG 24 hr tablet Take 50 mg by mouth daily. Take with or immediately following a meal.    Yes [provider]  naproxen  sodium (ALEVE) 220 MG tablet Take 440 mg by mouth daily as needed (pain/headache).   Yes [provider]  Polyethyl Glycol-Propyl Glycol (SYSTANE OP) Place 1 drop into both eyes 3 (three) times daily as needed (Dry eyes).    Yes [provider]  polyethylene glycol (MIRALAX / GLYCOLAX) packet Take 17 g by mouth 2 (two) times daily as needed (constipation).    Yes [provider]  pyridOXINE (VITAMIN B-6) 100 MG tablet Take 100 mg by mouth daily.   Yes [provider]  sertraline (ZOLOFT) 50 MG tablet Take 1 tablet (50 mg total) by mouth daily. Patient taking differently: Take 50 mg by mouth at bedtime.  05/15/17  Yes [name], MD  sodium chloride (OCEAN) 0.65 % SOLN nasal spray Place 1 spray into both nostrils as needed for congestion.  Yes [provider]  sucralfate (CARAFATE) 1 g tablet Take 1 tablet (1 g total) by mouth 4 (four) times daily as needed. Patient taking differently: Take 1 g by mouth 4 (four) times daily as needed (acid reflux).  12/17/17  Yes Maudie Flakes, MD  vitamin C (ASCORBIC ACID) 250 MG tablet Take 1 tablet (250 mg total) by mouth daily. 05/15/17  Yes [name], MD  methocarbamol (ROBAXIN) 500 MG tablet Take 1 tablet (500 mg total) by mouth 4 (four) times daily. Patient not taking: Reported on 12/24/2017 05/15/17   [name], MD  predniSONE (DELTASONE) 10 MG tablet Take 10-20 mg by mouth See admin instructions. Take 2 tablets (20 mg) by mouth twice daily for 2 days, then take 1 tablet (10 mg) twice daily for 2 days, then take 1 tablet (10 mg) daily for 2 days, then stop 12/19/17   [provider]    Inpatient Medications: Scheduled Meds: . amLODipine  5 mg Oral QHS  . atorvastatin  20 mg Oral q1800  . enoxaparin (LOVENOX) injection  40 mg Subcutaneous Daily  . irbesartan  150 mg Oral BID  . metoprolol succinate  50 mg Oral q morning - 10a  . sertraline  50 mg Oral Daily   Continuous Infusions: . 0.9 %  NaCl with KCl 20 mEq / L 75 mL/hr at 12/25/17 0041   PRN Meds: acetaminophen **OR** acetaminophen, hydrALAZINE, linaclotide, methocarbamol, ondansetron **OR** ondansetron (ZOFRAN) IV  Allergies:    Allergies  Allergen Reactions  . Shellfish Allergy Itching, Nausea And Vomiting and Swelling    Lip swelling  . Ivp Dye [Iodinated Diagnostic Agents] Itching, Rash and Other (See Comments)    bruises  . Penicillins Rash    Has patient had a PCN reaction causing immediate rash, facial/tongue/throat swelling, SOB or lightheadedness with hypotension: Yes Has patient had a PCN reaction causing severe rash involving mucus membranes or skin necrosis: Yes Has patient had a PCN reaction that required hospitalization: Unknown Has patient had a PCN reaction occurring within the last 10 years: No If all of the above answers are "NO", then may proceed with Cephalosporins Pt's mother told her this "a long time ago"    Social History:   Social History   Socioeconomic History  . Marital status: Divorced    Spouse name: Not on file  . Number of children: 0  . Years of education: Not on file  . Highest education level: Not on file  Occupational History  . Occupation: Education officer, museum  Social Needs  . Financial resource strain: Not on file  . Food insecurity:    Worry: Not on file    Inability: Not on file  . Transportation needs:    Medical: Not on file    Non-medical: Not on file  Tobacco Use  . Smoking status: Never Smoker  . Smokeless tobacco: Never Used  Substance and Sexual Activity  . Alcohol use: Yes    Alcohol/week: 0.0 oz    Comment: social  . Drug use: No  . Sexual activity: Never  Lifestyle  . Physical activity:    Days per week: Not on file    Minutes per session: Not on file  . Stress: Not on file  Relationships  . Social connections:    Talks on phone: Not on file    Gets together: Not on file    Attends religious service: Not on file    Active member of club or  organization: Not on  file    Attends meetings of clubs or organizations: Not on file    Relationship status: Not on file  . Intimate partner violence:    Fear of current or ex partner: Not on file    Emotionally abused: Not on file    Physically abused: Not on file    Forced sexual activity: Not on file  Other Topics Concern  . Not on file  Social History Narrative  . Not on file    Family History:    Family History  Problem Relation Age of Onset  . Heart attack Father   . CAD Mother        MI in her 59's  . Hypertension Mother   . Diabetes Mother   . CAD Maternal Grandmother        died of MI and CVA at age 67  . Stroke Maternal Grandmother   . Colon cancer Neg Hx   . Rectal cancer Neg Hx   . Stomach cancer Neg Hx   . Esophageal cancer Neg Hx   . Liver cancer Neg Hx      ROS:  Please see the history of present illness.   All other ROS reviewed and negative.     Physical Exam/Data:   Vitals:   12/24/17 2314 12/25/17 0518 12/25/17 0957 12/25/17 1207  BP: (!) 169/84 134/64 132/64 126/70  Pulse: (!) 57 (!) 57 60 (!) 57  Resp: 16 16  18   Temp: 98.1 F (36.7 C) 98 F (36.7 C)  98.1 F (36.7 C)  TempSrc: Oral Oral  Oral  SpO2: 100% 100%  97%  Weight: 133 lb 14.4 oz (60.7 kg) 132 lb 14.4 oz (60.3 kg)    Height: 5\' 2"  (1.575 m)       Intake/Output Summary (Last 24 hours) at 12/25/2017 1440 Last data filed at 12/25/2017 0518 Gross per 24 hour  Intake 524.93 ml  Output 600 ml  Net -75.07 ml   Filed Weights   12/24/17 1628 12/24/17 2314 12/25/17 0518  Weight: 129 lb (58.5 kg) 133 lb 14.4 oz (60.7 kg) 132 lb 14.4 oz (60.3 kg)   Body mass index is 24.31 kg/m.  General:  Well nourished, well developed, in no acute distress HEENT: normal Lymph: no adenopathy Neck: no JVD Endocrine:  No thryomegaly Vascular: No carotid bruits; FA pulses 2+ bilaterally without bruits  Cardiac:  normal S1, S2; RRR; no murmur  Lungs:  clear to auscultation bilaterally, no  wheezing, rhonchi or rales  Abd: soft, nontender, no hepatomegaly  Ext: no edema Musculoskeletal:  No deformities, BUE and BLE strength normal and equal Skin: warm and dry  Neuro:  CNs 2-12 intact, no focal abnormalities noted Psych:  Normal affect   EKG:  The EKG was personally reviewed and demonstrates:  Normal sinus rhythm, 60 bpm, Nonspecific T wave abnormality-similar to previous Telemetry:  Telemetry was personally reviewed and demonstrates:  Sinus bradycardia in the mid 50's.  Relevant CV Studies:  Echocardiogram 12/25/17 - Left ventricle: The cavity size was normal. Wall thickness was normal. Systolic function was normal. The estimated ejection fraction was in the range of 60% to 65%. Wall motion was normal; there were no regional wall motion abnormalities. Left ventricular diastolic function parameters were normal for the patient&'s age.  Laboratory Data:  Chemistry Recent Labs  Lab 12/24/17 1643 12/25/17 0538  NA 137 141  K 2.5* 3.8  CL 99 106  CO2 26 27  GLUCOSE 130* 96  BUN 21* 15  CREATININE 1.00 0.82  CALCIUM 9.5 8.9  GFRNONAA >60 >60  GFRAA >60 >60  ANIONGAP 12 8    Recent Labs  Lab 12/25/17 0538  PROT 6.0*  ALBUMIN 3.3*  AST 22  ALT 45*  ALKPHOS 77  BILITOT 0.4   Hematology Recent Labs  Lab 12/24/17 1643 12/25/17 0538  WBC 9.2 7.8  RBC 4.69 4.44  HGB 14.1 13.5  HCT 42.4 40.5  MCV 90.4 91.2  MCH 30.1 30.4  MCHC 33.3 33.3  RDW 11.9 12.0  PLT 421* 344   Cardiac Enzymes Recent Labs  Lab 12/25/17 0538  TROPONINI <0.03   No results for input(s): TROPIPOC in the last 168 hours.  BNPNo results for input(s): BNP, PROBNP in the last 168 hours.  DDimer  Recent Labs  Lab 12/25/17 0538  DDIMER 0.39    Radiology/Studies:  Ct Head Wo Contrast  Result Date: 12/24/2017 CLINICAL DATA:  Syncope leading to fall striking face. Head pain. C-spine trauma, high clinical risk (NEXUS/CCR); Maxface trauma blunt EXAM: CT HEAD WITHOUT CONTRAST CT  MAXILLOFACIAL WITHOUT CONTRAST CT CERVICAL SPINE WITHOUT CONTRAST TECHNIQUE: Multidetector CT imaging of the head, cervical spine, and maxillofacial structures were performed using the standard protocol without intravenous contrast. Multiplanar CT image reconstructions of the cervical spine and maxillofacial structures were also generated. COMPARISON:  Head CT 06/16/2009 FINDINGS: CT HEAD FINDINGS Brain: Brain volume is normal for age. No intracranial hemorrhage, mass effect, or midline shift. No hydrocephalus. The basilar cisterns are patent. No evidence of territorial infarct or acute ischemia. No extra-axial or intracranial fluid collection. Vascular: No hyperdense vessel or unexpected calcification. Skull: No fracture or focal lesion. Other: None. CT MAXILLOFACIAL FINDINGS Osseous: Nasal bone, zygomatic arches, and mandibles are intact. The temporomandibular joints are congruent. Orbits: No orbital fracture.  Both orbits and globes are intact. Sinuses: Clear.  No sinus fracture or fluid level. Soft tissues: Negative. CT CERVICAL SPINE FINDINGS Alignment: Slight straightening of normal lordosis. No traumatic subluxation. Skull base and vertebrae: No acute fracture. Vertebral body heights are maintained. The dens and skull base are intact. Soft tissues and spinal canal: No prevertebral fluid or swelling. No visible canal hematoma. Disc levels: Disc space narrowing and endplate spurring at CT C3, C5-C6 and C6-C7. Mild scattered facet arthropathy. Upper chest: Negative. Other: None. IMPRESSION: 1. Negative head CT. 2. Negative maxillofacial CT. 3. Straightening of normal cervical lordosis typically seen with muscle spasm or positioning. No cervical spine fracture. Electronically Signed   By: Jeb Levering M.D.   On: 12/24/2017 21:19   Ct Cervical Spine Wo Contrast  Result Date: 12/24/2017 CLINICAL DATA:  Syncope leading to fall striking face. Head pain. C-spine trauma, high clinical risk (NEXUS/CCR); Maxface  trauma blunt EXAM: CT HEAD WITHOUT CONTRAST CT MAXILLOFACIAL WITHOUT CONTRAST CT CERVICAL SPINE WITHOUT CONTRAST TECHNIQUE: Multidetector CT imaging of the head, cervical spine, and maxillofacial structures were performed using the standard protocol without intravenous contrast. Multiplanar CT image reconstructions of the cervical spine and maxillofacial structures were also generated. COMPARISON:  Head CT 06/16/2009 FINDINGS: CT HEAD FINDINGS Brain: Brain volume is normal for age. No intracranial hemorrhage, mass effect, or midline shift. No hydrocephalus. The basilar cisterns are patent. No evidence of territorial infarct or acute ischemia. No extra-axial or intracranial fluid collection. Vascular: No hyperdense vessel or unexpected calcification. Skull: No fracture or focal lesion. Other: None. CT MAXILLOFACIAL FINDINGS Osseous: Nasal bone, zygomatic arches, and mandibles are intact. The temporomandibular joints are congruent. Orbits: No orbital fracture.  Both orbits and  globes are intact. Sinuses: Clear.  No sinus fracture or fluid level. Soft tissues: Negative. CT CERVICAL SPINE FINDINGS Alignment: Slight straightening of normal lordosis. No traumatic subluxation. Skull base and vertebrae: No acute fracture. Vertebral body heights are maintained. The dens and skull base are intact. Soft tissues and spinal canal: No prevertebral fluid or swelling. No visible canal hematoma. Disc levels: Disc space narrowing and endplate spurring at CT C3, C5-C6 and C6-C7. Mild scattered facet arthropathy. Upper chest: Negative. Other: None. IMPRESSION: 1. Negative head CT. 2. Negative maxillofacial CT. 3. Straightening of normal cervical lordosis typically seen with muscle spasm or positioning. No cervical spine fracture. Electronically Signed   By: Jeb Levering M.D.   On: 12/24/2017 21:19   Ct Maxillofacial Wo Contrast  Result Date: 12/24/2017 CLINICAL DATA:  Syncope leading to fall striking face. Head pain. C-spine  trauma, high clinical risk (NEXUS/CCR); Maxface trauma blunt EXAM: CT HEAD WITHOUT CONTRAST CT MAXILLOFACIAL WITHOUT CONTRAST CT CERVICAL SPINE WITHOUT CONTRAST TECHNIQUE: Multidetector CT imaging of the head, cervical spine, and maxillofacial structures were performed using the standard protocol without intravenous contrast. Multiplanar CT image reconstructions of the cervical spine and maxillofacial structures were also generated. COMPARISON:  Head CT 06/16/2009 FINDINGS: CT HEAD FINDINGS Brain: Brain volume is normal for age. No intracranial hemorrhage, mass effect, or midline shift. No hydrocephalus. The basilar cisterns are patent. No evidence of territorial infarct or acute ischemia. No extra-axial or intracranial fluid collection. Vascular: No hyperdense vessel or unexpected calcification. Skull: No fracture or focal lesion. Other: None. CT MAXILLOFACIAL FINDINGS Osseous: Nasal bone, zygomatic arches, and mandibles are intact. The temporomandibular joints are congruent. Orbits: No orbital fracture.  Both orbits and globes are intact. Sinuses: Clear.  No sinus fracture or fluid level. Soft tissues: Negative. CT CERVICAL SPINE FINDINGS Alignment: Slight straightening of normal lordosis. No traumatic subluxation. Skull base and vertebrae: No acute fracture. Vertebral body heights are maintained. The dens and skull base are intact. Soft tissues and spinal canal: No prevertebral fluid or swelling. No visible canal hematoma. Disc levels: Disc space narrowing and endplate spurring at CT C3, C5-C6 and C6-C7. Mild scattered facet arthropathy. Upper chest: Negative. Other: None. IMPRESSION: 1. Negative head CT. 2. Negative maxillofacial CT. 3. Straightening of normal cervical lordosis typically seen with muscle spasm or positioning. No cervical spine fracture. Electronically Signed   By: Jeb Levering M.D.   On: 12/24/2017 21:19    Assessment and Plan:   Syncope -Pt was outside sitting in the shade yesterday  when she developed dizziness. She she stood to walk around and clear her head and lost consciousness, falling to the ground. She has no recollection of chest discomfort, palpitations or shortness of breath surrounding the event.  -Hx of one similar syncopal event over 20 years ago.  -Pt was hypokalemic upon presentation with K+ 2.5. Potassium has been replaced and HCTZ held. K+ now normalized at 3.8. -BUN and SCr were mildly elevated from her usual which could possibly represent some dehydration although she reports eating and drinking as usual prior to the event and does not feel that she was dehydrated.  -Telemetry has shown sinus bradycardia in the mid 50's with no significant arrhythmias.  -Not orthostatic in the ED. D-dimer was not elevated.  -Differentials include transient hypotension, bradycardia, arrhythmia, cardiac event (low suspicion). -Consider outpatient event monitor.   Chest pain -No prior MI or cardiac event. She had low risk exercise stress test in 2009 and normal echo in 2006. -Episode of chest  pain last week with shortness of breath (non-exertional), seen in the ED. Ruled out for MI and felt to be musculoskeletal in nature. Improved with NSAIDs, PPI and steroids.  -No recent exertional chest discomfort or shortness of breath. No current chest pain.  -EKG is without significant changes from previous. -Troponin was negative -CVD risk factors include HTN, HLD, family history -She had breast cancer in 2015 s/p lumpectomy, chemo and radiation. She is continued on exemestane.  -Echo done today shows normal LV function with EF 60-65%, no regional wall motion abnormalities and normal LV diastolic function.  -Will discuss further myocardial ischemic workup with Dr. Percival Spanish.   Hypertension -Home meds include amlodipine 5 mg at hs, hydrochlorothiazide 25 mg daily, irbesartan 150 mg bid, Toprol XL 50 mg daily -HCTZ is on hold due to hypokalemia. BP is currently well controlled.    Hyperlipidemia -On atorvastatin 20 mg daily. Followed by PCP  Palpitations -History of palpitations many years ago well controlled with metoprolol. Pt denies any recent palpitations.   For questions or updates, please contact [place] Please consult [email or web address] for contact info under Cardiology/STEMI.   Signed, Daune Perch, NP  12/25/2017 2:40 PM   History and all data above reviewed.  Patient examined.  I agree with the findings as above.  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  She was feeling OK and was sitting under a tree.  She stood up and she felt dizzy.  She then went to the ground injuring her face. She apparently felt dizzy before she stood up.  She does not recall having any palpitations.  She was not having any discomfort at the time.  She otherwise felt well.  She has not had this kind of event before.  She had some chest pain last week and was seen in the ED with negative work up.  She was being managed for chest wall pain.  This had resolved.  She is not overly active but she does some activities of daily living and she does work as mentioned and she has not had significant limitations with this.  Tele was unremarkable. Enzymes and EKG without acute changes.  Echo unremarkable as above.  She did have significant hypokalemia on admission and HCTZ has been stopped.  The patient exam reveals COR:RRR  ,  Lungs: Clear  ,  Abd: Positive bowel sounds, no rebound no guarding, Ext No edema  .  All available labs, radiology testing, previous records reviewed. Agree with documented assessment and plan. Syncope:  One episode without any overt findings on exam/labs/tele or echo.  Question vagal episode.  BUN and creat suggest some component of dehydration.    Given the fact that this was an isolated event I would not suggest that out patient event monitor would be helpful.  She will let us know if she has any further events with  presyncope/syncope or palpitations.  I agree with stopping the HCTZ.  If she needs BP treatment I would then suggest starting with a low dose of Norvasc.  She does have atypical chest pain and a strong family history.   I would suggest follow up POET ([place] Exercise Treadmill) which we can arrange in our office.      Jeneen Rinks Kyelle Urbas  4:32 PM  12/25/2017

## 2017-12-25 NOTE — Discharge Summary (Signed)
Physician Discharge Summary  Mckenzie Sanford JAS:[id number] DOB: [birth year] DOA: 12/24/2017  PCP: [name], MD  Admit date: 12/24/2017 Discharge date: 12/25/2017  Admitted From: home Disposition:  home  Recommendations for Outpatient Follow-up:  1. Follow up with PCP in 1-2 weeks 2. Please obtain BMP/CBC in one week 3. Please follow up on the following pending results:  Home Health: none Equipment/Devices: none  Discharge Condition: stable CODE STATUS: Full code Diet recommendation: regular  HPI: Per Dr. Glyn Ade, Mckenzie Sanford is a 62 y.o. female with history of breast cancer in remission, hypertension, hyperlipidemia, IBS was brought to the ER after patient had an episode of loss of consciousness.  Patient was at her workplace when she started feeling dizzy and she stood up and suddenly she remembers next to this people was around her.  She fell onto her face and hurt her face.  Denies having any incontinence of urine or bowels.  Denies any tongue bite.  Denies any chest pain or palpitation now.  Last week patient had come to the ER with some chest pain and at the time ER physician attributed to musculoskeletal pain and discharged home on NSAIDs.  Patient had followed up with the PCP who had prescribed prednisone which she took last dose yesterday.  Has not had any nausea vomiting or diarrhea. ED Course: In the ER patient was not orthostatic.  CT head neck and maxillofacial did not show anything acute.  EKG shows normal sinus rhythm with QTC of 388 ms.  Potassium was markedly low and was given replacement.  Patient admitted for further management of syncope.  Hospital Course: Syncopal episode -patient was admitted to the hospital with a syncopal episode.  This was in the setting of having an ED visit for chest pain last week.  Cardiology was consulted and followed patient while hospitalized.  She underwent a 2D echo which was fairly unremarkable.  She had no arrhythmias on  telemetry.  Her EKG was nonischemic and she has had negative cardiac enzymes as well as a negative d-dimer.  She does have cardiac risk factors with history of hyperlipidemia, hypertension and family history.  She will be followed up by cardiology as an outpatient with plans for stress test.  She was discharged home in stable condition with cardiology outpatient follow-up Hypokalemia -likely in the setting of diuresis, hold hydrochlorothiazide on discharge Hypertension -stable blood pressure off of HCTZ, continue current regimen.  Can consider Norvasc as an outpatient if blood pressure remains a problem Breast cancer status post lumpectomy/radiation therapy and chemotherapy -followed as an outpatient by Dr. [name] HLD -continue statin IBS -stable, continue Linzess Depression -continue Zoloft  Discharge Diagnoses:  Active Problems:   Hypertension   Breast cancer of upper-outer quadrant of right female breast ([place])   Syncope   Hypokalemia     Discharge Instructions   Allergies as of 12/25/2017      Reactions   Shellfish Allergy Itching, Nausea And Vomiting, Swelling   Lip swelling   Ivp Dye [iodinated Diagnostic Agents] Itching, Rash, Other (See Comments)   bruises   Penicillins Rash   Has patient had a PCN reaction causing immediate rash, facial/tongue/throat swelling, SOB or lightheadedness with hypotension: Yes Has patient had a PCN reaction causing severe rash involving mucus membranes or skin necrosis: Yes Has patient had a PCN reaction that required hospitalization: Unknown Has patient had a PCN reaction occurring within the last 10 years: No If all of the above answers are "NO", then may  proceed with Cephalosporins Pt's mother told her this "a long time ago"      Medication List    STOP taking these medications   hydrochlorothiazide 25 MG tablet Commonly known as:  HYDRODIURIL     TAKE these medications   amLODipine 10 MG tablet Commonly known as:  NORVASC Take 5 mg  by mouth at bedtime.   atorvastatin 20 MG tablet Commonly known as:  LIPITOR Take 20 mg by mouth at bedtime.   Biotin 2500 MCG Caps Take 2,500 mcg by mouth daily.   cholecalciferol 1000 units tablet Commonly known as:  VITAMIN D Take 1 tablet (1,000 Units total) by mouth daily. What changed:  when to take this   Cod Liver Oil 5000-500 UNIT/5ML Oil Take 5 mLs by mouth daily. What changed:    when to take this  reasons to take this   diphenhydrAMINE 25 mg capsule Commonly known as:  BENADRYL Take 25 mg by mouth every 6 (six) hours as needed for itching.   esomeprazole 20 MG capsule Commonly known as:  NEXIUM Take 20 mg by mouth daily at 12 noon.   exemestane 25 MG tablet Commonly known as:  AROMASIN TAKE 1 TABLET BY MOUTH DAILY AFTER BREAKFAST   irbesartan 300 MG tablet Commonly known as:  AVAPRO Take 150 mg by mouth 2 (two) times daily.   LINZESS 290 MCG Caps capsule Generic drug:  linaclotide Take 290 mcg by mouth daily as needed (IBS symptoms).   Melatonin 3 MG Tabs Take 3 mg by mouth at bedtime as needed (sleep).   methocarbamol 750 MG tablet Commonly known as:  ROBAXIN Take 750 mg by mouth every 8 (eight) hours as needed for muscle spasms. What changed:  Another medication with the same name was removed. Continue taking this medication, and follow the directions you see here.   metoprolol succinate 50 MG 24 hr tablet Commonly known as:  TOPROL-XL Take 50 mg by mouth daily. Take with or immediately following a meal.   naproxen sodium 220 MG tablet Commonly known as:  ALEVE Take 440 mg by mouth daily as needed (pain/headache).   polyethylene glycol packet Commonly known as:  MIRALAX / GLYCOLAX Take 17 g by mouth 2 (two) times daily as needed (constipation).   predniSONE 10 MG tablet Commonly known as:  DELTASONE Take 10-20 mg by mouth See admin instructions. Take 2 tablets (20 mg) by mouth twice daily for 2 days, then take 1 tablet (10 mg) twice daily  for 2 days, then take 1 tablet (10 mg) daily for 2 days, then stop   pyridOXINE 100 MG tablet Commonly known as:  VITAMIN B-6 Take 100 mg by mouth daily.   sertraline 50 MG tablet Commonly known as:  ZOLOFT Take 1 tablet (50 mg total) by mouth daily. What changed:  when to take this   sodium chloride 0.65 % Soln nasal spray Commonly known as:  OCEAN Place 1 spray into both nostrils as needed for congestion.   sucralfate 1 g tablet Commonly known as:  CARAFATE Take 1 tablet (1 g total) by mouth 4 (four) times daily as needed. What changed:  reasons to take this   SYSTANE OP Place 1 drop into both eyes 3 (three) times daily as needed (Dry eyes).   TYLENOL 8 HOUR 650 MG CR tablet Generic drug:  acetaminophen Take 650 mg by mouth every 8 (eight) hours as needed for pain.   vitamin C 250 MG tablet Commonly known as:  ASCORBIC  ACID Take 1 tablet (250 mg total) by mouth daily.      Follow-up Information    Avva, Ravisankar, MD. Call in 1 week.   Specialty:  Internal Medicine Why:  schedule appt within 10 days for hospital follow up  Contact information: [place] [address] [phone]           Consultations:  Cardiology   Procedures/Studies:  2D echo  Study Conclusions - Left ventricle: The cavity size was normal. Wall thickness was normal. Systolic function was normal. The estimated ejection fraction was in the range of 60% to 65%. Wall motion was normal; there were no regional wall motion abnormalities. Left ventricular diastolic function parameters were normal for the patient&'s age.  Dg Chest 2 View  Result Date: 12/17/2017 CLINICAL DATA:  Chest pain and shortness of breath. EXAM: CHEST - 2 VIEW COMPARISON:  08/05/2014 FINDINGS: The heart size and mediastinal contours are within normal limits. Both lungs are clear. Slight pectus excavatum deformity. Power port has been removed since the prior study. IMPRESSION: No active cardiopulmonary  disease. Electronically Signed   By: Lorriane Shire M.D.   On: 12/17/2017 12:28   Ct Head Wo Contrast  Result Date: 12/24/2017 CLINICAL DATA:  Syncope leading to fall striking face. Head pain. C-spine trauma, high clinical risk (NEXUS/CCR); Maxface trauma blunt EXAM: CT HEAD WITHOUT CONTRAST CT MAXILLOFACIAL WITHOUT CONTRAST CT CERVICAL SPINE WITHOUT CONTRAST TECHNIQUE: Multidetector CT imaging of the head, cervical spine, and maxillofacial structures were performed using the standard protocol without intravenous contrast. Multiplanar CT image reconstructions of the cervical spine and maxillofacial structures were also generated. COMPARISON:  Head CT 06/16/2009 FINDINGS: CT HEAD FINDINGS Brain: Brain volume is normal for age. No intracranial hemorrhage, mass effect, or midline shift. No hydrocephalus. The basilar cisterns are patent. No evidence of territorial infarct or acute ischemia. No extra-axial or intracranial fluid collection. Vascular: No hyperdense vessel or unexpected calcification. Skull: No fracture or focal lesion. Other: None. CT MAXILLOFACIAL FINDINGS Osseous: Nasal bone, zygomatic arches, and mandibles are intact. The temporomandibular joints are congruent. Orbits: No orbital fracture.  Both orbits and globes are intact. Sinuses: Clear.  No sinus fracture or fluid level. Soft tissues: Negative. CT CERVICAL SPINE FINDINGS Alignment: Slight straightening of normal lordosis. No traumatic subluxation. Skull base and vertebrae: No acute fracture. Vertebral body heights are maintained. The dens and skull base are intact. Soft tissues and spinal canal: No prevertebral fluid or swelling. No visible canal hematoma. Disc levels: Disc space narrowing and endplate spurring at CT C3, C5-C6 and C6-C7. Mild scattered facet arthropathy. Upper chest: Negative. Other: None. IMPRESSION: 1. Negative head CT. 2. Negative maxillofacial CT. 3. Straightening of normal cervical lordosis typically seen with muscle spasm  or positioning. No cervical spine fracture. Electronically Signed   By: Jeb Levering M.D.   On: 12/24/2017 21:19   Ct Cervical Spine Wo Contrast  Result Date: 12/24/2017 CLINICAL DATA:  Syncope leading to fall striking face. Head pain. C-spine trauma, high clinical risk (NEXUS/CCR); Maxface trauma blunt EXAM: CT HEAD WITHOUT CONTRAST CT MAXILLOFACIAL WITHOUT CONTRAST CT CERVICAL SPINE WITHOUT CONTRAST TECHNIQUE: Multidetector CT imaging of the head, cervical spine, and maxillofacial structures were performed using the standard protocol without intravenous contrast. Multiplanar CT image reconstructions of the cervical spine and maxillofacial structures were also generated. COMPARISON:  Head CT 06/16/2009 FINDINGS: CT HEAD FINDINGS Brain: Brain volume is normal for age. No intracranial hemorrhage, mass effect, or midline shift. No hydrocephalus. The basilar cisterns are patent.  No evidence of territorial infarct or acute ischemia. No extra-axial or intracranial fluid collection. Vascular: No hyperdense vessel or unexpected calcification. Skull: No fracture or focal lesion. Other: None. CT MAXILLOFACIAL FINDINGS Osseous: Nasal bone, zygomatic arches, and mandibles are intact. The temporomandibular joints are congruent. Orbits: No orbital fracture.  Both orbits and globes are intact. Sinuses: Clear.  No sinus fracture or fluid level. Soft tissues: Negative. CT CERVICAL SPINE FINDINGS Alignment: Slight straightening of normal lordosis. No traumatic subluxation. Skull base and vertebrae: No acute fracture. Vertebral body heights are maintained. The dens and skull base are intact. Soft tissues and spinal canal: No prevertebral fluid or swelling. No visible canal hematoma. Disc levels: Disc space narrowing and endplate spurring at CT C3, C5-C6 and C6-C7. Mild scattered facet arthropathy. Upper chest: Negative. Other: None. IMPRESSION: 1. Negative head CT. 2. Negative maxillofacial CT. 3. Straightening of normal  cervical lordosis typically seen with muscle spasm or positioning. No cervical spine fracture. Electronically Signed   By: Jeb Levering M.D.   On: 12/24/2017 21:19   Ct Maxillofacial Wo Contrast  Result Date: 12/24/2017 CLINICAL DATA:  Syncope leading to fall striking face. Head pain. C-spine trauma, high clinical risk (NEXUS/CCR); Maxface trauma blunt EXAM: CT HEAD WITHOUT CONTRAST CT MAXILLOFACIAL WITHOUT CONTRAST CT CERVICAL SPINE WITHOUT CONTRAST TECHNIQUE: Multidetector CT imaging of the head, cervical spine, and maxillofacial structures were performed using the standard protocol without intravenous contrast. Multiplanar CT image reconstructions of the cervical spine and maxillofacial structures were also generated. COMPARISON:  Head CT 06/16/2009 FINDINGS: CT HEAD FINDINGS Brain: Brain volume is normal for age. No intracranial hemorrhage, mass effect, or midline shift. No hydrocephalus. The basilar cisterns are patent. No evidence of territorial infarct or acute ischemia. No extra-axial or intracranial fluid collection. Vascular: No hyperdense vessel or unexpected calcification. Skull: No fracture or focal lesion. Other: None. CT MAXILLOFACIAL FINDINGS Osseous: Nasal bone, zygomatic arches, and mandibles are intact. The temporomandibular joints are congruent. Orbits: No orbital fracture.  Both orbits and globes are intact. Sinuses: Clear.  No sinus fracture or fluid level. Soft tissues: Negative. CT CERVICAL SPINE FINDINGS Alignment: Slight straightening of normal lordosis. No traumatic subluxation. Skull base and vertebrae: No acute fracture. Vertebral body heights are maintained. The dens and skull base are intact. Soft tissues and spinal canal: No prevertebral fluid or swelling. No visible canal hematoma. Disc levels: Disc space narrowing and endplate spurring at CT C3, C5-C6 and C6-C7. Mild scattered facet arthropathy. Upper chest: Negative. Other: None. IMPRESSION: 1. Negative head CT. 2. Negative  maxillofacial CT. 3. Straightening of normal cervical lordosis typically seen with muscle spasm or positioning. No cervical spine fracture. Electronically Signed   By: Jeb Levering M.D.   On: 12/24/2017 21:19      Subjective: - no chest pain, shortness of breath, no abdominal pain, nausea or vomiting.   Discharge Exam: Vitals:   12/25/17 0957 12/25/17 1207  BP: 132/64 126/70  Pulse: 60 (!) 57  Resp:  18  Temp:  98.1 F (36.7 C)  SpO2:  97%    General: Pt is alert, awake, not in acute distress Cardiovascular: RRR, S1/S2 +, no rubs, no gallops Respiratory: CTA bilaterally, no wheezing, no rhonchi Abdominal: Soft, NT, ND, bowel sounds + Extremities: no edema, no cyanosis    The results of significant diagnostics from this hospitalization (including imaging, microbiology, ancillary and laboratory) are listed below for reference.     Microbiology: No results found for this or any previous visit (from the past  240 hour(s)).   Labs: BNP (last 3 results) No results for input(s): BNP in the last 8760 hours. Basic Metabolic Panel: Recent Labs  Lab 12/24/17 1643 12/25/17 0538  NA 137 141  K 2.5* 3.8  CL 99 106  CO2 26 27  GLUCOSE 130* 96  BUN 21* 15  CREATININE 1.00 0.82  CALCIUM 9.5 8.9  MG  --  2.0   Liver Function Tests: Recent Labs  Lab 12/25/17 0538  AST 22  ALT 45*  ALKPHOS 77  BILITOT 0.4  PROT 6.0*  ALBUMIN 3.3*   No results for input(s): LIPASE, AMYLASE in the last 168 hours. No results for input(s): AMMONIA in the last 168 hours. CBC: Recent Labs  Lab 12/24/17 1643 12/25/17 0538  WBC 9.2 7.8  NEUTROABS  --  4.1  HGB 14.1 13.5  HCT 42.4 40.5  MCV 90.4 91.2  PLT 421* 344   Cardiac Enzymes: Recent Labs  Lab 12/25/17 0538  TROPONINI <0.03   BNP: Invalid input(s): POCBNP CBG: Recent Labs  Lab 12/24/17 1913  GLUCAP 119*   D-Dimer Recent Labs    12/25/17 0538  DDIMER 0.39   Hgb A1c No results for input(s): HGBA1C in the last  72 hours. Lipid Profile No results for input(s): CHOL, HDL, LDLCALC, TRIG, CHOLHDL, LDLDIRECT in the last 72 hours. Thyroid function studies No results for input(s): TSH, T4TOTAL, T3FREE, THYROIDAB in the last 72 hours.  Invalid input(s): FREET3 Anemia work up No results for input(s): VITAMINB12, FOLATE, FERRITIN, TIBC, IRON, RETICCTPCT in the last 72 hours. Urinalysis    Component Value Date/Time   COLORURINE YELLOW 12/24/2017 [place] 12/24/2017 1749   LABSPEC 1.018 12/24/2017 1749   PHURINE 5.0 12/24/2017 1749   GLUCOSEU NEGATIVE 12/24/2017 1749   HGBUR NEGATIVE 12/24/2017 1749   BILIRUBINUR NEGATIVE 12/24/2017 1749   BILIRUBINUR neg 12/11/2011 1132   [place] 12/24/2017 1749   PROTEINUR NEGATIVE 12/24/2017 1749   UROBILINOGEN 0.2 12/11/2011 1132   UROBILINOGEN 0.2 06/16/2009 2232   NITRITE NEGATIVE 12/24/2017 1749   LEUKOCYTESUR NEGATIVE 12/24/2017 1749   Sepsis Labs Invalid input(s): PROCALCITONIN,  WBC,  LACTICIDVEN   Time coordinating discharge: 40 minutes  SIGNED:  Marzetta Board, MD  [facility] 12/25/2017, 5:09 PM Pager [phone]  If 7PM-7AM, please contact night-coverage [email or web address] Password [email or web address]

## 2017-12-25 NOTE — Progress Notes (Signed)
Pt discharge instructions reviewed with pt. Pt verbalizes understanding and states she has no questions. Pt's belonging with pt. Pt is not in distress. Pt discharged via wheelchair. Pt's family member is driving her home.

## 2017-12-25 NOTE — Discharge Instructions (Signed)
Follow with Avva, Ravisankar, MD in 5-7 days  Please get a complete blood count and chemistry panel checked by your Primary MD at your next visit, and again as instructed by your Primary MD. Please get your medications reviewed and adjusted by your Primary MD.  Please request your Primary MD to go over all Hospital Tests and Procedure/Radiological results at the follow up, please get all Hospital records sent to your Prim MD by signing hospital release before you go home.  If you had Pneumonia of Lung problems at the Hospital: Please get a 2 view Chest X ray done in 6-8 weeks after hospital discharge or sooner if instructed by your Primary MD.  If you have Congestive Heart Failure: Please call your Cardiologist or Primary MD anytime you have any of the following symptoms:  1) 3 pound weight gain in 24 hours or 5 pounds in 1 week  2) shortness of breath, with or without a dry hacking cough  3) swelling in the hands, feet or stomach  4) if you have to sleep on extra pillows at night in order to breathe  Follow cardiac low salt diet and 1.5 lit/day fluid restriction.  If you have diabetes Accuchecks 4 times/day, Once in AM empty stomach and then before each meal. Log in all results and show them to your primary doctor at your next visit. If any glucose reading is under 80 or above 300 call your primary MD immediately.  If you have Seizure/Convulsions/Epilepsy: Please do not drive, operate heavy machinery, participate in activities at heights or participate in high speed sports until you have seen by Primary MD or a Neurologist and advised to do so again.  If you had Gastrointestinal Bleeding: Please ask your Primary MD to check a complete blood count within one week of discharge or at your next visit. Your endoscopic/colonoscopic biopsies that are pending at the time of discharge, will also need to followed by your Primary MD.  Get Medicines reviewed and adjusted. Please take all your  medications with you for your next visit with your Primary MD  Please request your Primary MD to go over all hospital tests and procedure/radiological results at the follow up, please ask your Primary MD to get all Hospital records sent to his/her office.  If you experience worsening of your admission symptoms, develop shortness of breath, life threatening emergency, suicidal or homicidal thoughts you must seek medical attention immediately by calling 911 or calling your MD immediately  if symptoms less severe.  You must read complete instructions/literature along with all the possible adverse reactions/side effects for all the Medicines you take and that have been prescribed to you. Take any new Medicines after you have completely understood and accpet all the possible adverse reactions/side effects.   Do not drive or operate heavy machinery when taking Pain medications.   Do not take more than prescribed Pain, Sleep and Anxiety Medications  Special Instructions: If you have smoked or chewed Tobacco  in the last 2 yrs please stop smoking, stop any regular Alcohol  and or any Recreational drug use.  Wear Seat belts while driving.  Please note You were cared for by a hospitalist during your hospital stay. If you have any questions about your discharge medications or the care you received while you were in the hospital after you are discharged, you can call the unit and asked to speak with the hospitalist on call if the hospitalist that took care of you is not available. Once  you are discharged, your primary care physician will handle any further medical issues. Please note that NO REFILLS for any discharge medications will be authorized once you are discharged, as it is imperative that you return to your primary care physician (or establish a relationship with a primary care physician if you do not have one) for your aftercare needs so that they can reassess your need for medications and monitor your  lab values.  You can reach the hospitalist office at phone [phone] or fax [phone]   If you do not have a primary care physician, you can call [phone] for a physician referral.  Activity: As tolerated with Full fall precautions use walker/cane & assistance as needed  Diet: regular  Disposition Home

## 2018-01-01 ENCOUNTER — Other Ambulatory Visit: Payer: Self-pay | Admitting: *Deleted

## 2018-01-01 DIAGNOSIS — R55 Syncope and collapse: Secondary | ICD-10-CM

## 2018-01-01 DIAGNOSIS — I10 Essential (primary) hypertension: Secondary | ICD-10-CM

## 2018-01-02 ENCOUNTER — Telehealth (HOSPITAL_COMMUNITY): Payer: Self-pay

## 2018-01-02 NOTE — Telephone Encounter (Signed)
Encounter complete. 

## 2018-01-04 ENCOUNTER — Ambulatory Visit (HOSPITAL_COMMUNITY)
Admission: RE | Admit: 2018-01-04 | Discharge: 2018-01-04 | Disposition: A | Discharge status: Home / Self Care | Payer: BLUE CROSS/BLUE SHIELD | Source: Ambulatory Visit | Attending: Cardiology | Admitting: Cardiology

## 2018-01-04 DIAGNOSIS — R55 Syncope and collapse: Secondary | ICD-10-CM

## 2018-01-04 DIAGNOSIS — I10 Essential (primary) hypertension: Secondary | ICD-10-CM | POA: Diagnosis not present

## 2018-01-04 LAB — EXERCISE TOLERANCE TEST
CHL CUP MPHR: 161 {beats}/min
CHL CUP RESTING HR STRESS: 61 {beats}/min
CSEPPHR: 137 {beats}/min
Estimated workload: 10.7 METS
Exercise duration (min): 9 min
Exercise duration (sec): 30 s
Percent HR: 85 %
RPE: 19

## 2018-01-10 NOTE — Progress Notes (Signed)
Cardiology Office Note   Date:  01/11/2018   ID:  Mckenzie Sanford, DOB 1955/06/16, MRN [id number]  PCP:  [name], MD  Cardiologist:   No primary care provider on file.   Chief Complaint  Patient presents with  . Loss of Consciousness      History of Present Illness: Mckenzie Sanford is a 62 y.o. female who presents for follow up of LOC.  She was in the hospital in late July for this.  She had a normal echocardiogram.  There were no arrhythmias.  She had no ischemia on a POET (Plain Old Exercise Treadmill) although she did have a hypotensive BP response.    I brought her back today to discuss this.  She went for 9 minutes and 30 seconds on the treadmill.  There were no ischemic changes.  She did however get dizzy and had a low blood pressure.  She has not had any further syncopal episodes.  The episode that she did have that we reviewed in the hospital was when she was outside in heat and tree at work.  She stood up and passed out.  She will get dizzy in the heat still.  She might feel a little lightheaded if she does not get enough sleep and tries to be active.  She is had to go down her blood pressure medicine.  She is had some low blood pressures.  However, her blood pressures been somewhat labile.  Certainly goes up when she is tired or when she is anxious.  She does not sleep well.  She denies absolutely any chest pressure, neck or arm discomfort.  She had no shortness of breath, PND or orthopnea.  She said no weight gain or edema.   Past Medical History:  Diagnosis Date  . Anxiety   . Arthritis    in back  . Breast cancer ([place]) 03/12/14   right/invasive ductal carcinoma, DCIS  . Depression   . Difficulty sleeping    takes Lorazepam as needed  . HTN (hypertension)   . Hypercholesteremia   . IBS (irritable bowel syndrome)   . Radiation 11/23/14-01/07/15   Right breast    Past Surgical History:  Procedure Laterality Date  . BREAST SURGERY  11/15   lumpectomy  RT breast  . COLONOSCOPY    . CYST EXCISION     right axilla  . DILATION AND CURETTAGE OF UTERUS    . PORT-A-CATH REMOVAL N/A 03/12/2015   Procedure: REMOVAL PORT-A-CATH;  Surgeon: [name], MD;  Location: [facility];  Service: General;  Laterality: N/A;  . PORTACATH PLACEMENT Right 06/01/2014   Procedure: ULTRASOUND GUIDED INSERTION PORT-A-CATH;  Surgeon: [name], MD;  Location: [facility];  Service: General;  Laterality: Right;  . RADIOACTIVE SEED GUIDED PARTIAL MASTECTOMY WITH AXILLARY SENTINEL LYMPH NODE BIOPSY Right 04/20/2014   Procedure: RIGHT BREAST RADIOACTIVE SEED GUIDED LUMPECTOMY WITH RIGHT AXILLARY SENTINEL LYMPH NODE BIOPSY;  Surgeon: [name], MD;  Location: [facility];  Service: General;  Laterality: Right;  . TONSILLECTOMY       Current Outpatient Medications  Medication Sig Dispense Refill  . acetaminophen (TYLENOL 8 HOUR) 650 MG CR tablet Take 650 mg by mouth every 8 (eight) hours as needed for pain.    Marland Kitchen amLODipine (NORVASC) 10 MG tablet Take 5 mg by mouth at bedtime.     Marland Kitchen atorvastatin (LIPITOR) 20 MG tablet Take 20 mg by mouth at bedtime.     . Biotin  2500 MCG CAPS Take 2,500 mcg by mouth daily.    . cholecalciferol (VITAMIN D) 1000 units tablet Take 1 tablet (1,000 Units total) by mouth daily. (Patient taking differently: Take 1,000 Units by mouth at bedtime. )    . Cod Liver Oil 5000-500 UNIT/5ML OIL Take 5 mLs by mouth daily. (Patient taking differently: Take 5 mLs by mouth daily as needed (immune system boost). )  0  . diphenhydrAMINE (BENADRYL) 25 mg capsule Take 25 mg by mouth every 6 (six) hours as needed for itching.     Marland Kitchen exemestane (AROMASIN) 25 MG tablet TAKE 1 TABLET BY MOUTH DAILY AFTER BREAKFAST 90 tablet 3  . irbesartan (AVAPRO) 300 MG tablet Take 150 mg by mouth 2 (two) times daily.     Marland Kitchen linaclotide (LINZESS) 290 MCG CAPS capsule Take 290 mcg by mouth daily as needed (IBS symptoms).    . Melatonin 3 MG  TABS Take 3 mg by mouth at bedtime as needed (sleep).    . metoprolol succinate (TOPROL-XL) 50 MG 24 hr tablet Take 50 mg by mouth daily. Take with or immediately following a meal.     . naproxen sodium (ALEVE) 220 MG tablet Take 440 mg by mouth daily as needed (pain/headache).    Vladimir Faster Glycol-Propyl Glycol (SYSTANE OP) Place 1 drop into both eyes 3 (three) times daily as needed (Dry eyes).     . polyethylene glycol (MIRALAX / GLYCOLAX) packet Take 17 g by mouth 2 (two) times daily as needed (constipation).     . pyridOXINE (VITAMIN B-6) 100 MG tablet Take 100 mg by mouth daily.    . sertraline (ZOLOFT) 50 MG tablet Take 1 tablet (50 mg total) by mouth daily. (Patient taking differently: Take 50 mg by mouth at bedtime. )    . sodium chloride (OCEAN) 0.65 % SOLN nasal spray Place 1 spray into both nostrils as needed for congestion.    . sucralfate (CARAFATE) 1 g tablet Take 1 tablet (1 g total) by mouth 4 (four) times daily as needed. (Patient taking differently: Take 1 g by mouth 4 (four) times daily as needed (acid reflux). ) 30 tablet 0   No current facility-administered medications for this visit.     Allergies:   Shellfish allergy; Ivp dye [iodinated diagnostic agents]; and Penicillins    ROS:  Please see the history of present illness.   Otherwise, review of systems are positive for none.   All other systems are reviewed and negative.    PHYSICAL EXAM: VS:  BP 140/78 (BP Location: Left Arm, Patient Position: Sitting, Cuff Size: Normal)   Pulse 60   Ht 5\' 2"  (1.575 m)   Wt 132 lb (59.9 kg)   LMP 09/26/2011   BMI 24.14 kg/m  , BMI Body mass index is 24.14 kg/m.  GENERAL:  Well appearing NECK:  No jugular venous distention, waveform within normal limits, carotid upstroke brisk and symmetric, no bruits, no thyromegaly LUNGS:  Clear to auscultation bilaterally CHEST:  Unremarkable HEART:  PMI not displaced or sustained,S1 and S2 within normal limits, no S3, no S4, no clicks, no  rubs, no murmurs ABD:  Flat, positive bowel sounds normal in frequency in pitch, no bruits, no rebound, no guarding, no midline pulsatile mass, no hepatomegaly, no splenomegaly EXT:  2 plus pulses throughout, no edema, no cyanosis no clubbing    EKG:  EKG is not ordered today.    Recent Labs: 12/25/2017: ALT 45; BUN 15; Creatinine, Ser 0.82; Hemoglobin 13.5;  Magnesium 2.0; Platelets 344; Potassium 3.8; Sodium 141    Lipid Panel No results found for: CHOL, TRIG, HDL, CHOLHDL, VLDL, LDLCALC, LDLDIRECT    Wt Readings from Last 3 Encounters:  01/11/18 132 lb (59.9 kg)  12/25/17 132 lb 14.4 oz (60.3 kg)  12/17/17 129 lb (58.5 kg)      Other studies Reviewed: Additional studies/ records that were reviewed today include: Hospital records and I reviewed the POET (Plain Old Exercise Treadmill). Review of the above records demonstrates:  Please see elsewhere in the note.     ASSESSMENT AND PLAN:  SYNCOPE:    It seems that this was related to the heat and possibly a vagal episode.  She does seem to have some labile blood pressures.  I reviewed her symptoms as above and the lack of any angina.  She had an excellent exercise tolerance.  I do think she has a vasodilatory response leading to her lower blood pressure.  I do not suspect this represents ischemia.  At this point we talked about avoidance.  She can return to work.  She will avoid the heat.  She was not orthostatic in the office today.  She will keep check on her blood pressure and I would like to see her back in 6 months to review and sooner if she has any syncopal episodes.  HTN: We had a lower some of her blood pressure medications.  I am not going to pursue the high blood pressures as I think she have more trouble with hypotension and that some of her high blood pressure is related to anxiety.  She will continue the meds as listed.   Current medicines are reviewed at length with the patient today.  The patient does not have  concerns regarding medicines.  The following changes have been made:  no change  Labs/ tests ordered today include: None No orders of the defined types were placed in this encounter.    Disposition:   FU with me in six months.     Signed, Minus Breeding, MD  01/11/2018 12:29 PM    [facility]

## 2018-01-11 ENCOUNTER — Telehealth: Payer: Self-pay | Admitting: Cardiology

## 2018-01-11 ENCOUNTER — Ambulatory Visit: Payer: BLUE CROSS/BLUE SHIELD | Admitting: Cardiology

## 2018-01-11 ENCOUNTER — Encounter: Payer: Self-pay | Admitting: Cardiology

## 2018-01-11 VITALS — BP 140/78 | HR 60 | Ht 62.0 in | Wt 132.0 lb

## 2018-01-11 DIAGNOSIS — R55 Syncope and collapse: Secondary | ICD-10-CM

## 2018-01-11 DIAGNOSIS — I10 Essential (primary) hypertension: Secondary | ICD-10-CM | POA: Diagnosis not present

## 2018-01-11 NOTE — Telephone Encounter (Signed)
Will forward to patient's cardiologist and his nurse so they are aware.

## 2018-01-11 NOTE — Telephone Encounter (Signed)
New message  Patient states that she forgot to tell during the visit on today that she was hospitalized in 2012 or 2013 for high blood pressure.

## 2018-01-11 NOTE — Patient Instructions (Signed)
Medication Instructions:  Continue current medications  If you need a refill on your cardiac medications before your next appointment, please call your pharmacy.  Labwork: None Ordered HERE IN OUR OFFICE AT [facility]  Take the provided lab slips with you to the lab for your blood draw.   * You will need to fast. DO NOT EAT OR DRINK PAST MIDNIGHT.    You will NOT need to fast   Testing/Procedures: None Ordered   Follow-Up: Your physician wants you to follow-up in: 6 Months. You should receive a reminder letter in the mail two months in advance. If you do not receive a letter, please call our office in [phone] to schedule your follow-up appointment.   Thank you for choosing [facility] at [facility]!!

## 2018-01-13 NOTE — Telephone Encounter (Signed)
Noted  

## 2018-02-06 ENCOUNTER — Telehealth: Payer: Self-pay

## 2018-02-06 NOTE — Telephone Encounter (Signed)
   [facility] Pre-operative Risk Assessment    Request for surgical clearance:  1. What type of surgery is being performed? Right Carpal tunnel release   2. When is this surgery scheduled? TBD   3. What type of clearance is required (medical clearance vs. Pharmacy clearance to hold med vs. Both)? Medical  4. Are there any medications that need to be held prior to surgery and how long? No    5. Practice name and name of physician performing surgery? [facility], Dr. Amedeo Plenty  6. What is your office phone number [phone]    7.   What is your office fax number [place]   8.   Anesthesia type (None, local, MAC, general) ? Local with IV sedation    Mckenzie Sanford 02/06/2018, 4:39 PM  _________________________________________________________________   (provider comments below)

## 2018-02-08 NOTE — Telephone Encounter (Signed)
   Primary Cardiologist: Minus Breeding, MD  Chart reviewed as part of pre-operative protocol coverage. Given past medical history and time since last visit, based on ACC/AHA guidelines, Mckenzie Sanford would be at acceptable risk for the planned procedure without further cardiovascular testing.   I will route this recommendation to the requesting party via Epic fax function and remove from pre-op pool.  Please call with questions.  Rosaria Ferries, PA-C 02/08/2018, 10:17 AM

## 2018-02-11 ENCOUNTER — Other Ambulatory Visit: Payer: Self-pay | Admitting: Hematology and Oncology

## 2018-05-14 NOTE — Progress Notes (Signed)
Patient Care Team: [name], MD as PCP - General (Internal Medicine) Minus Breeding, MD as PCP - Cardiology (Cardiology) [name], MD as Consulting Physician (General Surgery) [name], MD as Consulting Physician (Hematology and Oncology) [name], MD as Consulting Physician (Radiation Oncology) [name], MD as Consulting Physician (Obstetrics and Gynecology) [name], NP as Nurse Practitioner (Hematology and Oncology)  DIAGNOSIS:    ICD-10-CM   1. Malignant neoplasm of upper-outer quadrant of right breast in female, estrogen receptor positive ([place]) C50.411    Z17.0     SUMMARY OF ONCOLOGIC HISTORY:   Breast cancer of upper-outer quadrant of right female breast ([place])   03/10/2014 Mammogram    Right breast: mass    03/10/2014 Breast US    Right breast: 1 cm irregular mass, UOQ, anterior depth, hypoechoic with posterior acoustic shadowing.  LN with focal cortex thickening in right axilla.    03/12/2014 Initial Biopsy    Right breast core needle bx: Invasive ductal carcinoma with DCIS, right axillary lymph node biopsy negative, ER+ (100%), PR+ (84%), Ki-67 16%, HER-2 negative (ratio 1.22)    03/20/2014 Breast MRI    Right breast UOQ: 2.6 x 1.9 x 1.7 cm invasive ductal carcinoma with extension and involvement of the adjacent skin and nipple area complex, no abnormal lymph nodes    03/21/2014 Clinical Stage    Stage IA (T1b, N0, cM0)    04/20/2014 Definitive Surgery    Right breast lumpectomy/SLNB([name]): IDC grade 2; 2.5 cm with intermediate to high-grade DCIS margins negative: 1/3 positive sentinel lymph node with extracapsular extension, ER 100%, PR 84%, HER-2 negative ratio 1.22, Ki-67 16%    04/20/2014 Pathologic Stage     Stage IIB: pT2, pN1a, cM0     06/04/2014 - 10/22/2014 Adjuvant Chemotherapy    Dose dense doxorubicin and cyclophosphamide x 4 cycles followed by paclitaxel x 1 then nab-paclitaxel x 11 (total 12  cycles)    11/23/2014 - 01/07/2015 Radiation Therapy    Adjuvant RT [name]): Right breast / 45 Gray over 25 fractions; Right breast boost / 16 Gray over 8 fractions    02/17/2015 -  Anti-estrogen oral therapy    Anastrozole 1 mg daily. Switched to letrozole and then switched to exemestane 02/16/2016    04/01/2015 Survivorship    Survivorship care plan completed and given to patient.     CHIEF COMPLIANT: Follow-up on exemestane therapy  INTERVAL HISTORY: Mckenzie Sanford is a 62 y.o. with above-mentioned history of right breast cancer who is currently on exemestane for 3.5 years. I last saw the patient one year ago. In there interim she presented to the ED twice in 11/2017 for chest pain and loss of consciousness. Her most recent ECHO from 12/25/17 shows an ejection fracture in the range of 60% to 65%. Her most recent bone scan from 09/05/2017 show a T-score of -2.1.   She presents to the clinic today with her brother. She says she is doing well. She reports a sharp pain on Saturday in her breast that she had not previously felt. She reports she had carpal tunnel surgery in 02/2018 from which she is still recovering and has been out of work. Her hot flashes are severe and worse when working or exercising and at night wake her up in addition to being itchy. She reports occaisional dizziness and inability to sleep. She notes she is taking several supplements but doesn't like that they result in constipation. She reviewed her medication list with me. Her  next mammogram is in 08/2018.   REVIEW OF SYSTEMS:   Constitutional: Denies fevers, chills or abnormal weight loss (+) severe hot flashes Eyes: Denies blurriness of vision Ears, nose, mouth, throat, and face: Denies mucositis or sore throat Respiratory: Denies cough, dyspnea or wheezes Cardiovascular: Denies palpitation, chest discomfort Gastrointestinal:  Denies nausea, heartburn or change in bowel habits Skin: Denies abnormal skin  rashes Lymphatics: Denies new lymphadenopathy or easy bruising Neurological: Denies numbness, tingling or new weaknesses (+) occaisional dizziness Behavioral/Psych: Mood is stable, no new changes (+) unable to sleep Extremities: No lower extremity edema Breast: denies any lumps or nodules in either breasts (+) sharp pain in breast, resolved All other systems were reviewed with the patient and are negative.  I have reviewed the past medical history, past surgical history, social history and family history with the patient and they are unchanged from previous note.  ALLERGIES:  is allergic to shellfish allergy; ivp dye [iodinated diagnostic agents]; and penicillins.  MEDICATIONS:  Current Outpatient Medications  Medication Sig Dispense Refill  . acetaminophen (TYLENOL 8 HOUR) 650 MG CR tablet Take 650 mg by mouth every 8 (eight) hours as needed for pain.    Marland Kitchen amLODipine (NORVASC) 10 MG tablet Take 5 mg by mouth at bedtime.     Marland Kitchen atorvastatin (LIPITOR) 20 MG tablet Take 20 mg by mouth at bedtime.     . Biotin 2500 MCG CAPS Take 2,500 mcg by mouth daily.    . cholecalciferol (VITAMIN D) 1000 units tablet Take 1 tablet (1,000 Units total) by mouth daily. (Patient taking differently: Take 1,000 Units by mouth at bedtime. )    . Cod Liver Oil 5000-500 UNIT/5ML OIL Take 5 mLs by mouth daily. (Patient taking differently: Take 5 mLs by mouth daily as needed (immune system boost). )  0  . diphenhydrAMINE (BENADRYL) 25 mg capsule Take 25 mg by mouth every 6 (six) hours as needed for itching.     Marland Kitchen exemestane (AROMASIN) 25 MG tablet TAKE 1 TABLET BY MOUTH DAILY AFTER BREAKFAST 90 tablet 3  . irbesartan (AVAPRO) 300 MG tablet Take 150 mg by mouth 2 (two) times daily.     Marland Kitchen linaclotide (LINZESS) 290 MCG CAPS capsule Take 290 mcg by mouth daily as needed (IBS symptoms).    . Melatonin 3 MG TABS Take 3 mg by mouth at bedtime as needed (sleep).    . metoprolol succinate (TOPROL-XL) 50 MG 24 hr tablet Take 50  mg by mouth daily. Take with or immediately following a meal.     . naproxen sodium (ALEVE) 220 MG tablet Take 440 mg by mouth daily as needed (pain/headache).    Vladimir Faster Glycol-Propyl Glycol (SYSTANE OP) Place 1 drop into both eyes 3 (three) times daily as needed (Dry eyes).     . polyethylene glycol (MIRALAX / GLYCOLAX) packet Take 17 g by mouth 2 (two) times daily as needed (constipation).     . pyridOXINE (VITAMIN B-6) 100 MG tablet Take 100 mg by mouth daily.    . sertraline (ZOLOFT) 50 MG tablet Take 1 tablet (50 mg total) by mouth daily. (Patient taking differently: Take 50 mg by mouth at bedtime. )    . sodium chloride (OCEAN) 0.65 % SOLN nasal spray Place 1 spray into both nostrils as needed for congestion.    . sucralfate (CARAFATE) 1 g tablet Take 1 tablet (1 g total) by mouth 4 (four) times daily as needed. (Patient taking differently: Take 1 g by mouth 4 (  four) times daily as needed (acid reflux). ) 30 tablet 0   No current facility-administered medications for this visit.     PHYSICAL EXAMINATION: ECOG PERFORMANCE STATUS: 1 - Symptomatic but completely ambulatory  Vitals:   05/15/18 1012  BP: (!) 143/65  Pulse: (!) 59  Resp: 18  Temp: 98.6 F (37 C)  SpO2: 99%   Filed Weights   05/15/18 1012  Weight: 128 lb 12.8 oz (58.4 kg)    GENERAL:alert, no distress and comfortable SKIN: skin color, texture, turgor are normal, no rashes or significant lesions EYES: normal, Conjunctiva are pink and non-injected, sclera clear OROPHARYNX:no exudate, no erythema and lips, buccal mucosa, and tongue normal  NECK: supple, thyroid normal size, non-tender, without nodularity LYMPH:  no palpable lymphadenopathy in the cervical, axillary or inguinal LUNGS: clear to auscultation and percussion with normal breathing effort HEART: regular rate & rhythm and no murmurs and no lower extremity edema ABDOMEN:abdomen soft, non-tender and normal bowel sounds MUSCULOSKELETAL:no cyanosis of  digits and no clubbing  NEURO: alert & oriented x 3 with fluent speech, no focal motor/sensory deficits EXTREMITIES: No lower extremity edema BREAST: No palpable masses or nodules in either right or left breasts. No palpable axillary supraclavicular or infraclavicular adenopathy no breast tenderness or nipple discharge. (exam performed in the presence of a chaperone)  LABORATORY DATA:  I have reviewed the data as listed CMP Latest Ref Rng & Units 12/25/2017 12/24/2017 12/17/2017  Glucose 70 - 99 mg/dL 96 130(H) 88  BUN 6 - 20 mg/dL 15 21(H) 11  Creatinine 0.44 - 1.00 mg/dL 0.82 1.00 0.68  Sodium 135 - 145 mmol/L 141 137 140  Potassium 3.5 - 5.1 mmol/L 3.8 2.5(LL) 3.3(L)  Chloride 98 - 111 mmol/L 106 99 100  CO2 22 - 32 mmol/L 27 26 32  Calcium 8.9 - 10.3 mg/dL 8.9 9.5 10.2  Total Protein 6.5 - 8.1 g/dL 6.0(L) - -  Total Bilirubin 0.3 - 1.2 mg/dL 0.4 - -  Alkaline Phos 38 - 126 U/L 77 - -  AST 15 - 41 U/L 22 - -  ALT 0 - 44 U/L 45(H) - -    Lab Results  Component Value Date   WBC 7.8 12/25/2017   HGB 13.5 12/25/2017   HCT 40.5 12/25/2017   MCV 91.2 12/25/2017   PLT 344 12/25/2017   NEUTROABS 4.1 12/25/2017    ASSESSMENT & PLAN:  Breast cancer of upper-outer quadrant of right female breast ([place]) 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 Breast reduction surgery November 2018: Benign  Current treatment: anastrozole 1 mg daily 5 years started 02/16/2015, Switched to letrozole, switched to exemestane on 02/16/2016 (due to severe hot flashes myalgias)  Exemestanetoxicities: 1.Mild to moderate  tendinitis in the left wrist: Status carpal tunnel surgery, healing from that surgery now 2. intermittent severe hot flashes 3. Fatigue but able to manage her job and activities. She appears to be tolerating exemestane significantly better.  Insomnia issues:  Improved with melatonin Bone density test done at [facility] revealed a T score of -2: Osteopenia: I encouraged her to take calcium and vitamin D and weightbearing exercises.   Return to clinic  in1 yearfor follow-up    No orders of the defined types were placed in this encounter.  The patient has a good understanding of the overall plan. she agrees with it. she will call with any problems that may develop before the next visit here.  [name], MD 05/15/2018   I, Cloyde Reams Dorshimer, am acting as scribe for [name], MD.  I have reviewed the above documentation for accuracy and completeness, and I agree with the above.

## 2018-05-14 NOTE — Assessment & Plan Note (Signed)
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 Breast reduction surgery November 2018: Benign  Current treatment: anastrozole 1 mg daily 5 years started 02/16/2015, Switched to letrozole, switched to exemestane on 02/16/2016 (due to severe hot flashes myalgias)  Exemestanetoxicities: 1.Mild to moderate tendinitis in the left wrist: Status post injection 2. intermittent hot flashes 3. Fatigue but able to manage her job and activities. She appears to be tolerating exemestane significantly better.  Insomnia issues: I recommended guided imagery for her. Bone density test done at [facility] revealed a T score of -2: Osteopenia: I encouraged her to take calcium and vitamin D and weightbearing exercises.   Return to clinic in1 yearfor follow-up

## 2018-05-15 ENCOUNTER — Encounter: Payer: Self-pay | Admitting: *Deleted

## 2018-05-15 ENCOUNTER — Inpatient Hospital Stay: Payer: BLUE CROSS/BLUE SHIELD | Attending: Hematology and Oncology | Admitting: Hematology and Oncology

## 2018-05-15 ENCOUNTER — Telehealth: Payer: Self-pay | Admitting: Hematology and Oncology

## 2018-05-15 DIAGNOSIS — C773 Secondary and unspecified malignant neoplasm of axilla and upper limb lymph nodes: Secondary | ICD-10-CM | POA: Diagnosis not present

## 2018-05-15 DIAGNOSIS — N951 Menopausal and female climacteric states: Secondary | ICD-10-CM | POA: Insufficient documentation

## 2018-05-15 DIAGNOSIS — Z170 Estrogen receptor positive status [ER+]: Secondary | ICD-10-CM | POA: Insufficient documentation

## 2018-05-15 DIAGNOSIS — C50411 Malignant neoplasm of upper-outer quadrant of right female breast: Secondary | ICD-10-CM | POA: Diagnosis not present

## 2018-05-15 DIAGNOSIS — G4700 Insomnia, unspecified: Secondary | ICD-10-CM | POA: Diagnosis not present

## 2018-05-15 DIAGNOSIS — M8580 Other specified disorders of bone density and structure, unspecified site: Secondary | ICD-10-CM | POA: Insufficient documentation

## 2018-05-15 DIAGNOSIS — R5383 Other fatigue: Secondary | ICD-10-CM

## 2018-05-15 MED ORDER — EXEMESTANE 25 MG PO TABS: ORAL_TABLET | ORAL | 3 refills | Status: DC

## 2018-05-15 NOTE — Telephone Encounter (Signed)
Gave avs and calendar ° °

## 2018-09-10 ENCOUNTER — Encounter: Payer: Self-pay | Admitting: Hematology and Oncology

## 2018-09-10 ENCOUNTER — Other Ambulatory Visit: Payer: Self-pay | Admitting: Radiology

## 2018-10-11 ENCOUNTER — Telehealth: Payer: Self-pay | Admitting: *Deleted

## 2018-10-11 NOTE — Telephone Encounter (Signed)
MR faxed to [facility]; [phone]

## 2018-11-13 ENCOUNTER — Telehealth: Payer: Self-pay | Admitting: *Deleted

## 2018-11-13 NOTE — Telephone Encounter (Signed)
A message was left, re: follow up visit. 

## 2018-12-13 ENCOUNTER — Other Ambulatory Visit: Payer: Self-pay

## 2018-12-13 DIAGNOSIS — Z20822 Contact with and (suspected) exposure to covid-19: Secondary | ICD-10-CM

## 2018-12-18 LAB — NOVEL CORONAVIRUS, NAA: SARS-CoV-2, NAA: NOT DETECTED

## 2019-02-03 ENCOUNTER — Encounter: Payer: Self-pay | Admitting: Internal Medicine

## 2019-03-05 ENCOUNTER — Telehealth: Payer: Self-pay | Admitting: *Deleted

## 2019-03-05 NOTE — Telephone Encounter (Signed)
A message was left, re: her follow up visit. 

## 2019-03-18 NOTE — Progress Notes (Signed)
Cardiology Office Note   Date:  03/20/2019   ID:  NICKIA HEBRON, DOB November 04, 1955, MRN [id number]  PCP:  [name], MD  Cardiologist:   Minus Breeding, MD   Chief Complaint  Patient presents with  . Loss of Consciousness      History of Present Illness: Mckenzie Sanford is a 63 y.o. female who presents for follow up of LOC.  She was in the hospital in late July 2019 for this.  She had a normal echocardiogram.  There were no arrhythmias.  She had no ischemia on a POET (Plain Old Exercise Treadmill) although she did have a hypotensive BP response.   I did not think that this was high risk.  She returns for follow up.     Since I last saw her she has done well.  The patient denies any new symptoms such as chest discomfort, neck or arm discomfort. There has been no new shortness of breath, PND or orthopnea. There have been no reported palpitations, presyncope or syncope.  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  She is not exercising as much as I would like.    Past Medical History:  Diagnosis Date  . Anxiety   . Arthritis    in back  . Breast cancer ([place]) 03/12/14   right/invasive ductal carcinoma, DCIS  . Depression   . Difficulty sleeping    takes Lorazepam as needed  . HTN (hypertension)   . Hypercholesteremia   . IBS (irritable bowel syndrome)   . Radiation 11/23/14-01/07/15   Right breast    Past Surgical History:  Procedure Laterality Date  . BREAST SURGERY  11/15   lumpectomy RT breast  . COLONOSCOPY    . CYST EXCISION     right axilla  . DILATION AND CURETTAGE OF UTERUS    . PORT-A-CATH REMOVAL N/A 03/12/2015   Procedure: REMOVAL PORT-A-CATH;  Surgeon: [name], MD;  Location: [facility];  Service: General;  Laterality: N/A;  . PORTACATH PLACEMENT Right 06/01/2014   Procedure: ULTRASOUND GUIDED INSERTION PORT-A-CATH;  Surgeon: [name], MD;  Location: [facility];  Service: General;  Laterality: Right;  .  RADIOACTIVE SEED GUIDED PARTIAL MASTECTOMY WITH AXILLARY SENTINEL LYMPH NODE BIOPSY Right 04/20/2014   Procedure: RIGHT BREAST RADIOACTIVE SEED GUIDED LUMPECTOMY WITH RIGHT AXILLARY SENTINEL LYMPH NODE BIOPSY;  Surgeon: [name], MD;  Location: [facility];  Service: General;  Laterality: Right;  . TONSILLECTOMY       Current Outpatient Medications  Medication Sig Dispense Refill  . acetaminophen (TYLENOL 8 HOUR) 650 MG CR tablet Take 650 mg by mouth every 8 (eight) hours as needed for pain.    Marland Kitchen amLODipine (NORVASC) 10 MG tablet Take 5 mg by mouth at bedtime.     Marland Kitchen atorvastatin (LIPITOR) 20 MG tablet Take 20 mg by mouth at bedtime.     . Biotin 2500 MCG CAPS Take 2,500 mcg by mouth daily.    . cholecalciferol (VITAMIN D) 1000 units tablet Take 1 tablet (1,000 Units total) by mouth daily. (Patient taking differently: Take 1,000 Units by mouth at bedtime. Vitamin D3)    . Cod Liver Oil 5000-500 UNIT/5ML OIL Take 5 mLs by mouth daily. (Patient taking differently: Take 5 mLs by mouth daily as needed (immune system boost). )  0  . diphenhydrAMINE (BENADRYL) 25 mg capsule Take 25 mg by mouth every 6 (six) hours as needed for itching.     Marland Kitchen  exemestane (AROMASIN) 25 MG tablet TAKE 1 TABLET BY MOUTH DAILY AFTER BREAKFAST 90 tablet 3  . irbesartan (AVAPRO) 300 MG tablet Take 150 mg by mouth 2 (two) times daily.     Marland Kitchen linaclotide (LINZESS) 290 MCG CAPS capsule Take 290 mcg by mouth daily as needed (IBS symptoms).    . Melatonin 3 MG TABS Take 3 mg by mouth at bedtime as needed (sleep).    . metoprolol succinate (TOPROL-XL) 50 MG 24 hr tablet Take 50 mg by mouth daily. Take with or immediately following a meal.     . naproxen sodium (ALEVE) 220 MG tablet Take 440 mg by mouth daily as needed (pain/headache).    Vladimir Faster Glycol-Propyl Glycol (SYSTANE OP) Place 1 drop into both eyes 3 (three) times daily as needed (Dry eyes).     . polyethylene glycol (MIRALAX / GLYCOLAX) packet Take  17 g by mouth 2 (two) times daily as needed (constipation).     . sertraline (ZOLOFT) 50 MG tablet Take 1 tablet (50 mg total) by mouth daily. (Patient taking differently: Take 50 mg by mouth at bedtime. )     No current facility-administered medications for this visit.     Allergies:   Shellfish allergy, Ivp dye [iodinated diagnostic agents], Penicillin g, and Penicillins    ROS:  Please see the history of present illness.   Otherwise, review of systems are positive for none.   All other systems are reviewed and negative.    PHYSICAL EXAM: VS:  BP 138/83   Pulse (!) 55   Ht 5\' 2"  (1.575 m)   Wt 132 lb (59.9 kg)   LMP 09/26/2011   BMI 24.14 kg/m  , BMI Body mass index is 24.14 kg/m.  GENERAL:  Well appearing NECK:  No jugular venous distention, waveform within normal limits, carotid upstroke brisk and symmetric, no bruits, no thyromegaly LUNGS:  Clear to auscultation bilaterally CHEST:  Unremarkable HEART:  PMI not displaced or sustained,S1 and S2 within normal limits, no S3, no S4, no clicks, no rubs, no murmurs ABD:  Flat, positive bowel sounds normal in frequency in pitch, no bruits, no rebound, no guarding, no midline pulsatile mass, no hepatomegaly, no splenomegaly EXT:  2 plus pulses throughout, no edema, no cyanosis no clubbing   EKG:  EKG ordered today. Sinus rhythm, rate 54, axis within normal limits, intervals within normal limits, no acute ST-T wave changes.   Recent Labs: No results found for requested labs within last 8760 hours.    Lipid Panel No results found for: CHOL, TRIG, HDL, CHOLHDL, VLDL, LDLCALC, LDLDIRECT    Wt Readings from Last 3 Encounters:  03/20/19 132 lb (59.9 kg)  05/15/18 128 lb 12.8 oz (58.4 kg)  01/11/18 132 lb (59.9 kg)      Other studies Reviewed: Additional studies/ records that were reviewed today include: None. Review of the above records demonstrates:     ASSESSMENT AND PLAN:  SYNCOPE:     She has had no further events.   No change in therapy.  No further work up.  She will let me know if she has future events.  HTN:   Blood pressures well controlled.  No change in therapy.   Current medicines are reviewed at length with the patient today.  The patient does not have concerns regarding medicines.  The following changes have been made:  None  Labs/ tests ordered today include: None  Orders Placed This Encounter  Procedures  . EKG 12-Lead  Disposition:   FU with me as needed.   Signed, Minus Breeding, MD  03/20/2019 8:35 AM    [facility]

## 2019-03-20 ENCOUNTER — Encounter: Payer: Self-pay | Admitting: Cardiology

## 2019-03-20 ENCOUNTER — Other Ambulatory Visit: Payer: Self-pay

## 2019-03-20 ENCOUNTER — Ambulatory Visit: Payer: BC Managed Care – PPO | Admitting: Cardiology

## 2019-03-20 VITALS — BP 138/83 | HR 55 | Ht 62.0 in | Wt 132.0 lb

## 2019-03-20 DIAGNOSIS — I10 Essential (primary) hypertension: Secondary | ICD-10-CM

## 2019-03-20 DIAGNOSIS — R55 Syncope and collapse: Secondary | ICD-10-CM

## 2019-03-20 NOTE — Patient Instructions (Signed)
Medication Instructions:  Your physician recommends that you continue on your current medications as directed. Please refer to the Current Medication list given to you today.  If you need a refill on your cardiac medications before your next appointment, please call your pharmacy.   Lab work: NONE  Testing/Procedures: NONE  Follow-Up: At [facility], you and your health needs are our priority.  As part of our continuing mission to provide you with exceptional heart care, we have created designated Provider Care Teams.  These Care Teams include your primary Cardiologist (physician) and Advanced Practice Providers (APPs -  Physician Assistants and Nurse Practitioners) who all work together to provide you with the care you need, when you need it. You may see Minus Breeding, MD or one of the following Advanced Practice Providers on your designated Care Team:    Rosaria Ferries, PA-C  Jory Sims, DNP, ANP  Cadence Kathlen Mody, NP   Make appointments as needed.

## 2019-05-15 NOTE — Progress Notes (Signed)
Patient Care Team: [name], MD as PCP - General (Internal Medicine) Minus Breeding, MD as PCP - Cardiology (Cardiology) [name], MD as Consulting Physician (General Surgery) [name], MD as Consulting Physician (Hematology and Oncology) [name], MD as Consulting Physician (Radiation Oncology) [name], MD as Consulting Physician (Obstetrics and Gynecology) [name], NP as Nurse Practitioner (Hematology and Oncology)  DIAGNOSIS:    ICD-10-CM   1. Malignant neoplasm of upper-outer quadrant of right breast in female, estrogen receptor positive ([place])  C50.411    Z17.0     SUMMARY OF ONCOLOGIC HISTORY: Oncology History  Breast cancer of upper-outer quadrant of right female breast ([place])  03/10/2014 Mammogram   Right breast: mass   03/10/2014 Breast US   Right breast: 1 cm irregular mass, UOQ, anterior depth, hypoechoic with posterior acoustic shadowing.  LN with focal cortex thickening in right axilla.   03/12/2014 Initial Biopsy   Right breast core needle bx: Invasive ductal carcinoma with DCIS, right axillary lymph node biopsy negative, ER+ (100%), PR+ (84%), Ki-67 16%, HER-2 negative (ratio 1.22)   03/20/2014 Breast MRI   Right breast UOQ: 2.6 x 1.9 x 1.7 cm invasive ductal carcinoma with extension and involvement of the adjacent skin and nipple area complex, no abnormal lymph nodes   03/21/2014 Clinical Stage   Stage IA (T1b, N0, cM0)   04/20/2014 Definitive Surgery   Right breast lumpectomy/SLNB([name]): IDC grade 2; 2.5 cm with intermediate to high-grade DCIS margins negative: 1/3 positive sentinel lymph node with extracapsular extension, ER 100%, PR 84%, HER-2 negative ratio 1.22, Ki-67 16%   04/20/2014 Pathologic Stage    Stage IIB: pT2, pN1a, cM0    06/04/2014 - 10/22/2014 Adjuvant Chemotherapy   Dose dense doxorubicin and cyclophosphamide x 4 cycles followed by paclitaxel x 1 then nab-paclitaxel x 11 (total  12 cycles)   11/23/2014 - 01/07/2015 Radiation Therapy   Adjuvant RT [name]): Right breast / 45 Gray over 25 fractions; Right breast boost / 16 Gray over 8 fractions   02/17/2015 -  Anti-estrogen oral therapy   Anastrozole 1 mg daily. Switched to letrozole and then switched to exemestane 02/16/2016   04/01/2015 Survivorship   Survivorship care plan completed and given to patient.     CHIEF COMPLIANT: Follow-up of right breast cancer on exemestane therapy  INTERVAL HISTORY: Mckenzie Sanford is a 63 y.o. with above-mentioned history of right breast cancer treated with lumpectomy, adjuvant chemotherapy, radiation, and who is currently on anti-estrogen therapy with anastrozole. She presents to the clinic today for annual follow-up.  She had calcifications in the breast and underwent stereotactic biopsy which came back benign.  REVIEW OF SYSTEMS:   Constitutional: Denies fevers, chills or abnormal weight loss Eyes: Denies blurriness of vision Ears, nose, mouth, throat, and face: Denies mucositis or sore throat Respiratory: Denies cough, dyspnea or wheezes Cardiovascular: Denies palpitation, chest discomfort Gastrointestinal: Denies nausea, heartburn or change in bowel habits Skin: Denies abnormal skin rashes Lymphatics: Denies new lymphadenopathy or easy bruising Neurological: Denies numbness, tingling or new weaknesses Behavioral/Psych: Mood is stable, no new changes  Extremities: No lower extremity edema Breast: denies any pain or lumps or nodules in either breasts All other systems were reviewed with the patient and are negative.  I have reviewed the past medical history, past surgical history, social history and family history with the patient and they are unchanged from previous note.  ALLERGIES:  is allergic to shellfish allergy; ivp dye [iodinated diagnostic agents]; penicillin g; and penicillins.  MEDICATIONS:  Current Outpatient Medications  Medication Sig Dispense Refill    . acetaminophen (TYLENOL 8 HOUR) 650 MG CR tablet Take 650 mg by mouth every 8 (eight) hours as needed for pain.    Marland Kitchen amLODipine (NORVASC) 10 MG tablet Take 5 mg by mouth at bedtime.     Marland Kitchen atorvastatin (LIPITOR) 20 MG tablet Take 20 mg by mouth at bedtime.     . Biotin 2500 MCG CAPS Take 2,500 mcg by mouth daily.    . cholecalciferol (VITAMIN D) 1000 units tablet Take 1 tablet (1,000 Units total) by mouth daily. (Patient taking differently: Take 1,000 Units by mouth at bedtime. Vitamin D3)    . clindamycin (CLEOCIN) 150 MG capsule Take 1 capsule (150 mg total) by mouth daily as needed.    . Cod Liver Oil 5000-500 UNIT/5ML OIL Take 5 mLs by mouth daily. (Patient taking differently: Take 5 mLs by mouth daily as needed (immune system boost). )  0  . diphenhydrAMINE (BENADRYL) 25 mg capsule Take 25 mg by mouth every 6 (six) hours as needed for itching.     Marland Kitchen exemestane (AROMASIN) 25 MG tablet TAKE 1 TABLET BY MOUTH DAILY AFTER BREAKFAST 90 tablet 3  . irbesartan (AVAPRO) 300 MG tablet Take 150 mg by mouth 2 (two) times daily.     Marland Kitchen linaclotide (LINZESS) 290 MCG CAPS capsule Take 290 mcg by mouth daily as needed (IBS symptoms).    . Melatonin 3 MG TABS Take 3 mg by mouth at bedtime as needed (sleep).    . metoprolol succinate (TOPROL-XL) 50 MG 24 hr tablet Take 50 mg by mouth daily. Take with or immediately following a meal.     . naproxen sodium (ALEVE) 220 MG tablet Take 440 mg by mouth daily as needed (pain/headache).    Vladimir Faster Glycol-Propyl Glycol (SYSTANE OP) Place 1 drop into both eyes 3 (three) times daily as needed (Dry eyes).     . sertraline (ZOLOFT) 50 MG tablet Take 1 tablet (50 mg total) by mouth daily. (Patient taking differently: Take 50 mg by mouth at bedtime. )     No current facility-administered medications for this visit.    PHYSICAL EXAMINATION: ECOG PERFORMANCE STATUS: 1 - Symptomatic but completely ambulatory  Vitals:   05/16/19 1043  BP: (!) 145/75  Pulse: 61   Resp: 18  Temp: 98.5 F (36.9 C)  SpO2: 100%   Filed Weights   05/16/19 1043  Weight: 130 lb 1.6 oz (59 kg)    GENERAL: alert, no distress and comfortable SKIN: skin color, texture, turgor are normal, no rashes or significant lesions EYES: normal, Conjunctiva are pink and non-injected, sclera clear OROPHARYNX: no exudate, no erythema and lips, buccal mucosa, and tongue normal  NECK: supple, thyroid normal size, non-tender, without nodularity LYMPH: no palpable lymphadenopathy in the cervical, axillary or inguinal LUNGS: clear to auscultation and percussion with normal breathing effort HEART: regular rate & rhythm and no murmurs and no lower extremity edema ABDOMEN: abdomen soft, non-tender and normal bowel sounds MUSCULOSKELETAL: no cyanosis of digits and no clubbing  NEURO: alert & oriented x 3 with fluent speech, no focal motor/sensory deficits EXTREMITIES: No lower extremity edema BREAST: No palpable masses or nodules in either right or left breasts. No palpable axillary supraclavicular or infraclavicular adenopathy no breast tenderness or nipple discharge. (exam performed in the presence of a chaperone)  LABORATORY DATA:  I have reviewed the data as listed CMP Latest Ref Rng & Units 12/25/2017 12/24/2017 12/17/2017  Glucose 70 - 99 mg/dL 96 130(H) 88  BUN 6 - 20 mg/dL 15 21(H) 11  Creatinine 0.44 - 1.00 mg/dL 0.82 1.00 0.68  Sodium 135 - 145 mmol/L 141 137 140  Potassium 3.5 - 5.1 mmol/L 3.8 2.5(LL) 3.3(L)  Chloride 98 - 111 mmol/L 106 99 100  CO2 22 - 32 mmol/L 27 26 32  Calcium 8.9 - 10.3 mg/dL 8.9 9.5 10.2  Total Protein 6.5 - 8.1 g/dL 6.0(L) - -  Total Bilirubin 0.3 - 1.2 mg/dL 0.4 - -  Alkaline Phos 38 - 126 U/L 77 - -  AST 15 - 41 U/L 22 - -  ALT 0 - 44 U/L 45(H) - -    Lab Results  Component Value Date   WBC 7.8 12/25/2017   HGB 13.5 12/25/2017   HCT 40.5 12/25/2017   MCV 91.2 12/25/2017   PLT 344 12/25/2017   NEUTROABS 4.1 12/25/2017    ASSESSMENT &  PLAN:  Breast cancer of upper-outer quadrant of right female breast ([place]) 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 Breast reduction surgery November 2018: Benign -------------------------------------------------------------------------------------------------------------------------------------------------------- Current treatment: anastrozole 1 mg daily 5 years started 02/16/2015, Switched to letrozole, switched to exemestane on 02/16/2016 (due to severe hot flashes myalgias)  Exemestanetoxicities: 1.Mild to moderate tendinitis in the left wrist: Status carpal tunnel surgery, healing from that surgery now 2. intermittent severe hot flashes 3. Fatigue but able to manage her job and activities. She appears to be tolerating exemestane significantly better.  Insomnia issues: Improved with melatonin Bone density test done at [facility] revealed a T score of -2: Osteopenia: I encouraged her to take calcium and vitamin D and weightbearing exercises.  Breast cancer surveillance: 1.  Breast exam 05/16/2019: Benign 2.  Mammogram: [facility]: April 2020: One 4 cm area of calcifications: Stereotactic biopsy: Benign  Return to clinic in1 yearfor follow-up  No orders of the defined types were placed in this encounter.  The patient has a good understanding of the overall plan. she agrees with it. she will call with any problems that may develop before the next visit here.  [name], MD 05/16/2019  Julious Oka Dorshimer, am acting as scribe for Dr. [name].  I have reviewed the above document for accuracy  and completeness, and I agree with the above.

## 2019-05-16 ENCOUNTER — Inpatient Hospital Stay: Payer: BC Managed Care – PPO | Attending: Hematology and Oncology | Admitting: Hematology and Oncology

## 2019-05-16 ENCOUNTER — Other Ambulatory Visit: Payer: Self-pay

## 2019-05-16 DIAGNOSIS — Z170 Estrogen receptor positive status [ER+]: Secondary | ICD-10-CM | POA: Insufficient documentation

## 2019-05-16 DIAGNOSIS — M8580 Other specified disorders of bone density and structure, unspecified site: Secondary | ICD-10-CM | POA: Insufficient documentation

## 2019-05-16 DIAGNOSIS — C773 Secondary and unspecified malignant neoplasm of axilla and upper limb lymph nodes: Secondary | ICD-10-CM | POA: Diagnosis not present

## 2019-05-16 DIAGNOSIS — Z923 Personal history of irradiation: Secondary | ICD-10-CM | POA: Diagnosis not present

## 2019-05-16 DIAGNOSIS — C50411 Malignant neoplasm of upper-outer quadrant of right female breast: Secondary | ICD-10-CM | POA: Diagnosis present

## 2019-05-16 MED ORDER — EXEMESTANE 25 MG PO TABS: ORAL_TABLET | ORAL | 3 refills | Status: DC

## 2019-05-16 MED ORDER — CLINDAMYCIN HCL 150 MG PO CAPS: 150.0000 mg | ORAL_CAPSULE | Freq: Every day | ORAL | Status: DC | PRN

## 2019-05-16 NOTE — Assessment & Plan Note (Signed)
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 Breast reduction surgery November 2018: Benign -------------------------------------------------------------------------------------------------------------------------------------------------------- Current treatment: anastrozole 1 mg daily 5 years started 02/16/2015, Switched to letrozole, switched to exemestane on 02/16/2016 (due to severe hot flashes myalgias)  Exemestanetoxicities: 1.Mild to moderate tendinitis in the left wrist: Status carpal tunnel surgery, healing from that surgery now 2. intermittent severe hot flashes 3. Fatigue but able to manage her job and activities. She appears to be tolerating exemestane significantly better.  Insomnia issues: Improved with melatonin Bone density test done at [facility] revealed a T score of -2: Osteopenia: I encouraged her to take calcium and vitamin D and weightbearing exercises.  Breast cancer surveillance: 1.  Breast exam 05/16/2019: Benign 2.  Mammogram:  Return to clinic in1 yearfor follow-up

## 2019-05-21 ENCOUNTER — Encounter: Payer: Self-pay | Admitting: *Deleted

## 2020-05-08 ENCOUNTER — Other Ambulatory Visit: Payer: Self-pay | Admitting: Hematology and Oncology

## 2020-05-16 NOTE — Progress Notes (Signed)
Patient Care Team: [name], MD as PCP - General (Internal Medicine) Minus Breeding, MD as PCP - Cardiology (Cardiology) [name], MD as Consulting Physician (General Surgery) [name], MD as Consulting Physician (Hematology and Oncology) [name], MD as Consulting Physician (Radiation Oncology) [name], MD as Consulting Physician (Obstetrics and Gynecology) [name], NP as Nurse Practitioner (Hematology and Oncology)  DIAGNOSIS:    ICD-10-CM   1. Malignant neoplasm of upper-outer quadrant of right breast in female, estrogen receptor positive ([place])  C50.411    Z17.0     SUMMARY OF ONCOLOGIC HISTORY: Oncology History  Breast cancer of upper-outer quadrant of right female breast ([place])  03/10/2014 Mammogram   Right breast: mass   03/10/2014 Breast US   Right breast: 1 cm irregular mass, UOQ, anterior depth, hypoechoic with posterior acoustic shadowing.  LN with focal cortex thickening in right axilla.   03/12/2014 Initial Biopsy   Right breast core needle bx: Invasive ductal carcinoma with DCIS, right axillary lymph node biopsy negative, ER+ (100%), PR+ (84%), Ki-67 16%, HER-2 negative (ratio 1.22)   03/20/2014 Breast MRI   Right breast UOQ: 2.6 x 1.9 x 1.7 cm invasive ductal carcinoma with extension and involvement of the adjacent skin and nipple area complex, no abnormal lymph nodes   03/21/2014 Clinical Stage   Stage IA (T1b, N0, cM0)   04/20/2014 Definitive Surgery   Right breast lumpectomy/SLNB([name]): IDC grade 2; 2.5 cm with intermediate to high-grade DCIS margins negative: 1/3 positive sentinel lymph node with extracapsular extension, ER 100%, PR 84%, HER-2 negative ratio 1.22, Ki-67 16%   04/20/2014 Pathologic Stage    Stage IIB: pT2, pN1a, cM0    06/04/2014 - 10/22/2014 Adjuvant Chemotherapy   Dose dense doxorubicin and cyclophosphamide x 4 cycles followed by paclitaxel x 1 then nab-paclitaxel x 11 (total  12 cycles)   11/23/2014 - 01/07/2015 Radiation Therapy   Adjuvant RT [name]): Right breast / 45 Gray over 25 fractions; Right breast boost / 16 Gray over 8 fractions   02/17/2015 -  Anti-estrogen oral therapy   Anastrozole 1 mg daily. Switched to letrozole and then switched to exemestane 02/16/2016   04/01/2015 Survivorship   Survivorship care plan completed and given to patient.     CHIEF COMPLIANT: Follow-up of right breast cancer on exemestanetherapy  INTERVAL HISTORY: Mckenzie Sanford is a 64 y.o. with above-mentioned history of right breast cancer treated with lumpectomy, adjuvant chemotherapy, radiation, and who is currently on anti-estrogen therapy with anastrozole. Mammogram on 10/28/19 showed no evidence of malignancy bilaterally. Mckenzie Sanford presents to the clinic today for annual follow-up.  ALLERGIES:  is allergic to shellfish allergy, ivp dye [iodinated diagnostic agents], penicillin g, and penicillins.  MEDICATIONS:  Current Outpatient Medications  Medication Sig Dispense Refill  . acetaminophen (TYLENOL 8 HOUR) 650 MG CR tablet Take 650 mg by mouth every 8 (eight) hours as needed for pain.    Marland Kitchen amLODipine (NORVASC) 10 MG tablet Take 5 mg by mouth at bedtime.     Marland Kitchen atorvastatin (LIPITOR) 20 MG tablet Take 20 mg by mouth at bedtime.     . Biotin 2500 MCG CAPS Take 2,500 mcg by mouth daily.    . cholecalciferol (VITAMIN D) 1000 units tablet Take 1 tablet (1,000 Units total) by mouth daily. (Patient taking differently: Take 1,000 Units by mouth at bedtime. Vitamin D3)    . clindamycin (CLEOCIN) 150 MG capsule Take 1 capsule (150 mg total) by mouth daily as needed.    Marland Kitchen  Cod Liver Oil 5000-500 UNIT/5ML OIL Take 5 mLs by mouth daily. (Patient taking differently: Take 5 mLs by mouth daily as needed (immune system boost). )  0  . diphenhydrAMINE (BENADRYL) 25 mg capsule Take 25 mg by mouth every 6 (six) hours as needed for itching.     Marland Kitchen exemestane (AROMASIN) 25 MG tablet TAKE 1 TABLET BY  MOUTH DAILY AFTER BREAKFAST 90 tablet 3  . irbesartan (AVAPRO) 300 MG tablet Take 150 mg by mouth 2 (two) times daily.     Marland Kitchen linaclotide (LINZESS) 290 MCG CAPS capsule Take 290 mcg by mouth daily as needed (IBS symptoms).    . Melatonin 3 MG TABS Take 3 mg by mouth at bedtime as needed (sleep).    . metoprolol succinate (TOPROL-XL) 50 MG 24 hr tablet Take 50 mg by mouth daily. Take with or immediately following a meal.     . naproxen sodium (ALEVE) 220 MG tablet Take 440 mg by mouth daily as needed (pain/headache).    Vladimir Faster Glycol-Propyl Glycol (SYSTANE OP) Place 1 drop into both eyes 3 (three) times daily as needed (Dry eyes).     . sertraline (ZOLOFT) 50 MG tablet Take 1 tablet (50 mg total) by mouth daily. (Patient taking differently: Take 50 mg by mouth at bedtime. )     No current facility-administered medications for this visit.    PHYSICAL EXAMINATION: ECOG PERFORMANCE STATUS: 1 - Symptomatic but completely ambulatory  Vitals:   05/17/20 1049  BP: (!) 145/69  Pulse: 65  Resp: 18  Temp: 97.9 F (36.6 C)  SpO2: 99%   Filed Weights   05/17/20 1049  Weight: 129 lb 6.4 oz (58.7 kg)    BREAST: No palpable masses or nodules in either right or left breasts. No palpable axillary supraclavicular or infraclavicular adenopathy no breast tenderness or nipple discharge. (exam performed in the presence of a chaperone)  LABORATORY DATA:  I have reviewed the data as listed CMP Latest Ref Rng & Units 12/25/2017 12/24/2017 12/17/2017  Glucose 70 - 99 mg/dL 96 130(H) 88  BUN 6 - 20 mg/dL 15 21(H) 11  Creatinine 0.44 - 1.00 mg/dL 0.82 1.00 0.68  Sodium 135 - 145 mmol/L 141 137 140  Potassium 3.5 - 5.1 mmol/L 3.8 2.5(LL) 3.3(L)  Chloride 98 - 111 mmol/L 106 99 100  CO2 22 - 32 mmol/L 27 26 32  Calcium 8.9 - 10.3 mg/dL 8.9 9.5 10.2  Total Protein 6.5 - 8.1 g/dL 6.0(L) - -  Total Bilirubin 0.3 - 1.2 mg/dL 0.4 - -  Alkaline Phos 38 - 126 U/L 77 - -  AST 15 - 41 U/L 22 - -  ALT 0 - 44  U/L 45(H) - -    Lab Results  Component Value Date   WBC 7.8 12/25/2017   HGB 13.5 12/25/2017   HCT 40.5 12/25/2017   MCV 91.2 12/25/2017   PLT 344 12/25/2017   NEUTROABS 4.1 12/25/2017    ASSESSMENT & PLAN:  Breast cancer of upper-outer quadrant of right female breast ([place]) 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 Breast reduction surgery November 2018: Benign -------------------------------------------------------------------------------------------------------------------------------------------------------- Current treatment: anastrozole  1 mg daily 7 years started 02/16/2015, Switched to letrozole, switched to exemestane on 02/16/2016 (due to severe hot flashes myalgias)  Exemestanetoxicities: 1.Mild to moderate tendinitis in the left wrist: Statuscarpal tunnel surgery, healing from that surgery now 2. intermittentseverehot flashes 3. Fatigue but able to manage her job and activities. Mckenzie Sanford appears to be tolerating exemestane significantly better. I recommended extending her antiestrogen therapy for at least 7 years.  Insomnia issues:Improved with melatonin Bone density test done at [facility] revealed a T score of -2: Osteopenia: I encouraged her to take calcium and vitamin D and weightbearing exercises.  Breast cancer surveillance: 1.  Breast exam 05/17/2020: Benign 2.  Mammogram: [facility]: 10/28/2019: Stable appearance of left breast distortion.  Return to clinic in1 yearfor follow-up     No orders of the defined types were placed in this encounter.  The patient has a good  understanding of the overall plan. Mckenzie Sanford agrees with it. Mckenzie Sanford will call with any problems that may develop before the next visit here.  Total time spent: 30 mins including face to face time and time spent for planning, charting and coordination of care  [name], MD 05/17/2020  I, Cloyde Reams Dorshimer, am acting as scribe for Dr. [name].  I have reviewed the above documentation for accuracy and completeness, and I agree with the above.

## 2020-05-17 ENCOUNTER — Other Ambulatory Visit: Payer: Self-pay

## 2020-05-17 ENCOUNTER — Inpatient Hospital Stay: Payer: BC Managed Care – PPO | Attending: Hematology and Oncology | Admitting: Hematology and Oncology

## 2020-05-17 DIAGNOSIS — C50411 Malignant neoplasm of upper-outer quadrant of right female breast: Secondary | ICD-10-CM | POA: Diagnosis not present

## 2020-05-17 DIAGNOSIS — Z9221 Personal history of antineoplastic chemotherapy: Secondary | ICD-10-CM | POA: Diagnosis not present

## 2020-05-17 DIAGNOSIS — Z170 Estrogen receptor positive status [ER+]: Secondary | ICD-10-CM | POA: Insufficient documentation

## 2020-05-17 DIAGNOSIS — Z79811 Long term (current) use of aromatase inhibitors: Secondary | ICD-10-CM | POA: Diagnosis not present

## 2020-05-17 DIAGNOSIS — Z923 Personal history of irradiation: Secondary | ICD-10-CM | POA: Diagnosis not present

## 2020-05-17 DIAGNOSIS — M8580 Other specified disorders of bone density and structure, unspecified site: Secondary | ICD-10-CM | POA: Insufficient documentation

## 2020-05-17 MED ORDER — EXEMESTANE 25 MG PO TABS: ORAL_TABLET | ORAL | 3 refills | Status: DC

## 2020-05-17 NOTE — Assessment & Plan Note (Signed)
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 Breast reduction surgery November 2018: Benign -------------------------------------------------------------------------------------------------------------------------------------------------------- Current treatment: anastrozole 1 mg daily 5 years started 02/16/2015, Switched to letrozole, switched to exemestane on 02/16/2016 (due to severe hot flashes myalgias)  Exemestanetoxicities: 1.Mild to moderate tendinitis in the left wrist: Statuscarpal tunnel surgery, healing from that surgery now 2. intermittentseverehot flashes 3. Fatigue but able to manage her job and activities. She appears to be tolerating exemestane significantly better.  Insomnia issues:Improved with melatonin Bone density test done at [facility] revealed a T score of -2: Osteopenia: I encouraged her to take calcium and vitamin D and weightbearing exercises.  Breast cancer surveillance: 1.  Breast exam 05/17/2020: Benign 2.  Mammogram: [facility]: 10/28/2019: Stable appearance of left breast distortion.  Return to clinic in1 yearfor follow-up

## 2020-05-20 ENCOUNTER — Telehealth: Payer: Self-pay | Admitting: Hematology and Oncology

## 2020-05-20 NOTE — Telephone Encounter (Signed)
No 12/20 los, no changes made to pt schedule  

## 2020-06-18 ENCOUNTER — Other Ambulatory Visit: Payer: Self-pay | Admitting: Adult Health

## 2020-06-18 DIAGNOSIS — M5416 Radiculopathy, lumbar region: Secondary | ICD-10-CM

## 2020-06-27 ENCOUNTER — Other Ambulatory Visit: Payer: BC Managed Care – PPO

## 2020-07-05 ENCOUNTER — Ambulatory Visit
Admission: RE | Admit: 2020-07-05 | Discharge: 2020-07-05 | Disposition: A | Discharge status: Home / Self Care | Payer: BC Managed Care – PPO | Source: Ambulatory Visit | Attending: Adult Health | Admitting: Adult Health

## 2020-07-05 DIAGNOSIS — M5416 Radiculopathy, lumbar region: Secondary | ICD-10-CM

## 2020-10-19 ENCOUNTER — Ambulatory Visit: Payer: BC Managed Care – PPO | Attending: Internal Medicine

## 2020-10-19 DIAGNOSIS — Z20822 Contact with and (suspected) exposure to covid-19: Secondary | ICD-10-CM

## 2020-10-20 LAB — NOVEL CORONAVIRUS, NAA: SARS-CoV-2, NAA: NOT DETECTED

## 2020-10-20 LAB — SARS-COV-2, NAA 2 DAY TAT

## 2021-05-09 NOTE — Progress Notes (Incomplete)
Patient Care Team: [name], MD as PCP - General (Internal Medicine) Minus Breeding, MD as PCP - Cardiology (Cardiology) [name], MD as Consulting Physician (General Surgery) [name], MD as Consulting Physician (Hematology and Oncology) [name], MD as Consulting Physician (Radiation Oncology) [name], MD as Consulting Physician (Obstetrics and Gynecology) [name], NP as Nurse Practitioner (Hematology and Oncology)  DIAGNOSIS:    ICD-10-CM   1. Malignant neoplasm of upper-outer quadrant of right breast in female, estrogen receptor positive ([place])  C50.411    Z17.0       SUMMARY OF ONCOLOGIC HISTORY: Oncology History  Breast cancer of upper-outer quadrant of right female breast ([place])  03/10/2014 Mammogram   Right breast: mass   03/10/2014 Breast US   Right breast: 1 cm irregular mass, UOQ, anterior depth, hypoechoic with posterior acoustic shadowing.  LN with focal cortex thickening in right axilla.   03/12/2014 Initial Biopsy   Right breast core needle bx: Invasive ductal carcinoma with DCIS, right axillary lymph node biopsy negative, ER+ (100%), PR+ (84%), Ki-67 16%, HER-2 negative (ratio 1.22)   03/20/2014 Breast MRI   Right breast UOQ: 2.6 x 1.9 x 1.7 cm invasive ductal carcinoma with extension and involvement of the adjacent skin and nipple area complex, no abnormal lymph nodes   03/21/2014 Clinical Stage   Stage IA (T1b, N0, cM0)   04/20/2014 Definitive Surgery   Right breast lumpectomy/SLNB([name]): IDC grade 2; 2.5 cm with intermediate to high-grade DCIS margins negative: 1/3 positive sentinel lymph node with extracapsular extension, ER 100%, PR 84%, HER-2 negative ratio 1.22, Ki-67 16%   04/20/2014 Pathologic Stage    Stage IIB: pT2, pN1a, cM0    06/04/2014 - 10/22/2014 Adjuvant Chemotherapy   Dose dense doxorubicin and cyclophosphamide x 4 cycles followed by paclitaxel x 1 then nab-paclitaxel x 11  (total 12 cycles)   11/23/2014 - 01/07/2015 Radiation Therapy   Adjuvant RT [name]): Right breast / 45 Gray over 25 fractions; Right breast boost / 16 Gray over 8 fractions   02/17/2015 -  Anti-estrogen oral therapy   Anastrozole 1 mg daily. Switched to letrozole and then switched to exemestane 02/16/2016   04/01/2015 Survivorship   Survivorship care plan completed and given to patient.     CHIEF COMPLIANT: Follow-up of right breast cancer on exemestane therapy  INTERVAL HISTORY: Mckenzie Sanford is a 65 y.o. with above-mentioned history of right breast cancer treated with lumpectomy, adjuvant chemotherapy, radiation, and who is currently on anti-estrogen therapy with anastrozole. She presents to the clinic today for annual follow-up.   ALLERGIES:  is allergic to shellfish allergy, ivp dye [iodinated diagnostic agents], penicillin g, and penicillins.  MEDICATIONS:  Current Outpatient Medications  Medication Sig Dispense Refill   acetaminophen (TYLENOL 8 HOUR) 650 MG CR tablet Take 650 mg by mouth every 8 (eight) hours as needed for pain.     amLODipine (NORVASC) 10 MG tablet Take 5 mg by mouth at bedtime.      atorvastatin (LIPITOR) 20 MG tablet Take 20 mg by mouth at bedtime.      Biotin 2500 MCG CAPS Take 2,500 mcg by mouth daily.     cholecalciferol (VITAMIN D) 1000 units tablet Take 1 tablet (1,000 Units total) by mouth daily. (Patient taking differently: Take 1,000 Units by mouth at bedtime. Vitamin D3)     clindamycin (CLEOCIN) 150 MG capsule Take 1 capsule (150 mg total) by mouth daily as needed.     Cod Liver Oil 5000-500 UNIT/5ML  OIL Take 5 mLs by mouth daily. (Patient taking differently: Take 5 mLs by mouth daily as needed (immune system boost). )  0   diphenhydrAMINE (BENADRYL) 25 mg capsule Take 25 mg by mouth every 6 (six) hours as needed for itching.      exemestane (AROMASIN) 25 MG tablet Once daily 90 tablet 3   irbesartan (AVAPRO) 300 MG tablet Take 150 mg by mouth 2  (two) times daily.      linaclotide (LINZESS) 290 MCG CAPS capsule Take 290 mcg by mouth daily as needed (IBS symptoms).     Melatonin 3 MG TABS Take 3 mg by mouth at bedtime as needed (sleep).     metoprolol succinate (TOPROL-XL) 50 MG 24 hr tablet Take 50 mg by mouth daily. Take with or immediately following a meal.      naproxen sodium (ALEVE) 220 MG tablet Take 440 mg by mouth daily as needed (pain/headache).     Polyethyl Glycol-Propyl Glycol (SYSTANE OP) Place 1 drop into both eyes 3 (three) times daily as needed (Dry eyes).      sertraline (ZOLOFT) 50 MG tablet Take 1 tablet (50 mg total) by mouth daily. (Patient taking differently: Take 50 mg by mouth at bedtime. )     No current facility-administered medications for this visit.    PHYSICAL EXAMINATION: ECOG PERFORMANCE STATUS: {CHL ONC ECOG [phone]}  There were no vitals filed for this visit. There were no vitals filed for this visit.  BREAST:*** No palpable masses or nodules in either right or left breasts. No palpable axillary supraclavicular or infraclavicular adenopathy no breast tenderness or nipple discharge. (exam performed in the presence of a chaperone)  LABORATORY DATA:  I have reviewed the data as listed CMP Latest Ref Rng & Units 12/25/2017 12/24/2017 12/17/2017  Glucose 70 - 99 mg/dL 96 130(H) 88  BUN 6 - 20 mg/dL 15 21(H) 11  Creatinine 0.44 - 1.00 mg/dL 0.82 1.00 0.68  Sodium 135 - 145 mmol/L 141 137 140  Potassium 3.5 - 5.1 mmol/L 3.8 2.5(LL) 3.3(L)  Chloride 98 - 111 mmol/L 106 99 100  CO2 22 - 32 mmol/L 27 26 32  Calcium 8.9 - 10.3 mg/dL 8.9 9.5 10.2  Total Protein 6.5 - 8.1 g/dL 6.0(L) - -  Total Bilirubin 0.3 - 1.2 mg/dL 0.4 - -  Alkaline Phos 38 - 126 U/L 77 - -  AST 15 - 41 U/L 22 - -  ALT 0 - 44 U/L 45(H) - -    Lab Results  Component Value Date   WBC 7.8 12/25/2017   HGB 13.5 12/25/2017   HCT 40.5 12/25/2017   MCV 91.2 12/25/2017   PLT 344 12/25/2017   NEUTROABS 4.1 12/25/2017     ASSESSMENT & PLAN:  No problem-specific Assessment & Plan notes found for this encounter.    No orders of the defined types were placed in this encounter.  The patient has a good understanding of the overall plan. she agrees with it. she will call with any problems that may develop before the next visit here.  Total time spent: *** mins including face to face time and time spent for planning, charting and coordination of care  Rulon Eisenmenger, MD, MPH 05/09/2021  I, Thana Ates, am acting as scribe for Dr. [name].  {insert scribe attestation}

## 2021-05-09 NOTE — Assessment & Plan Note (Deleted)
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 Breast reduction surgery November 2018: Benign -------------------------------------------------------------------------------------------------------------------------------------------------------- Current treatment: anastrozole 1 mg daily 7 years started 02/16/2015, Switched to letrozole, switched to exemestane on 02/16/2016 (due to severe hot flashes myalgias)  Exemestanetoxicities: 1.Mild to moderate tendinitis in the left wrist: Statuscarpal tunnel surgery, healing from that surgery now 2. intermittentseverehot flashes 3. Fatigue but able to manage her job and activities. She appears to be tolerating exemestane significantly better. I recommended extending her antiestrogen therapy for at least 7 years.  Insomnia issues:Improved with melatonin Bone density test done at [facility] 11/03/2019 revealed a T score of -2.3: Osteopenia: I encouraged her to take calcium and vitamin D and weightbearing exercises.  I also recommended taking bisphosphonate therapy  Breast cancer surveillance: 1.Breast exam  05/10/2021: Benign 2.Mammogram:[facility]:  12/27/2020:  Benign breast density category B  Return to clinic in1 yearfor follow-up

## 2021-05-10 ENCOUNTER — Ambulatory Visit: Payer: BC Managed Care – PPO | Admitting: Hematology and Oncology

## 2021-05-10 ENCOUNTER — Telehealth: Payer: Self-pay | Admitting: Hematology and Oncology

## 2021-05-10 DIAGNOSIS — C50411 Malignant neoplasm of upper-outer quadrant of right female breast: Secondary | ICD-10-CM

## 2021-05-10 NOTE — Progress Notes (Signed)
Pt called in to after hours triage to state she is having flu sxs and needs to cancel appt with MD this morning, but she is going to f/u w/ PCP regarding flu. Pt requests call to r/s. Message sent to scheduling to r/s pt.

## 2021-05-10 NOTE — Telephone Encounter (Signed)
Scheduled per sch msg. Called and spoke with patient. Confirmed appt  

## 2021-05-18 ENCOUNTER — Other Ambulatory Visit: Payer: Self-pay | Admitting: *Deleted

## 2021-05-18 NOTE — Telephone Encounter (Signed)
Checking refill for Exemestane 25 mg ordered on 05/17/2021

## 2021-06-08 NOTE — Assessment & Plan Note (Signed)
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 Breast reduction surgery November 2018: Benign -------------------------------------------------------------------------------------------------------------------------------------------------------- Current treatment: anastrozole 1 mg daily 7 years started 02/16/2015, Switched to letrozole, switched to exemestane on 02/16/2016 (due to severe hot flashes myalgias)  Exemestanetoxicities: 1.Mild to moderate tendinitis in the left wrist: Statuscarpal tunnel surgery, healing from that surgery now 2. intermittentseverehot flashes 3. Fatigue but able to manage her job and activities. She appears to be tolerating exemestane significantly better. I recommended extending her antiestrogen therapy for at least 7 years.  Insomnia issues:Improved with melatonin Bone density test done at [facility] revealed a T score of -2: Osteopenia: I encouraged her to take calcium and vitamin D and weightbearing exercises.  Breast cancer surveillance: 1.Breast exam 06/09/21: Benign 2.Mammogram:[facility]: 10/28/2019: Stable appearance of left breast distortion.  Return to clinic in1 yearfor follow-up

## 2021-06-09 ENCOUNTER — Inpatient Hospital Stay: Payer: BC Managed Care – PPO | Attending: Hematology and Oncology | Admitting: Hematology and Oncology

## 2021-06-09 ENCOUNTER — Other Ambulatory Visit: Payer: Self-pay

## 2021-06-09 DIAGNOSIS — Z79811 Long term (current) use of aromatase inhibitors: Secondary | ICD-10-CM | POA: Insufficient documentation

## 2021-06-09 DIAGNOSIS — M8580 Other specified disorders of bone density and structure, unspecified site: Secondary | ICD-10-CM | POA: Insufficient documentation

## 2021-06-09 DIAGNOSIS — Z170 Estrogen receptor positive status [ER+]: Secondary | ICD-10-CM | POA: Diagnosis not present

## 2021-06-09 DIAGNOSIS — C50411 Malignant neoplasm of upper-outer quadrant of right female breast: Secondary | ICD-10-CM | POA: Insufficient documentation

## 2021-06-09 DIAGNOSIS — Z923 Personal history of irradiation: Secondary | ICD-10-CM | POA: Insufficient documentation

## 2021-06-09 DIAGNOSIS — Z9221 Personal history of antineoplastic chemotherapy: Secondary | ICD-10-CM | POA: Insufficient documentation

## 2021-06-09 MED ORDER — FLUTICASONE PROPIONATE 50 MCG/ACT NA SUSP: 1.0000 | Freq: Every day | NASAL | 2 refills | Status: AC

## 2021-06-09 MED ORDER — CRANBERRY 125 MG PO TABS: 1.0000 | ORAL_TABLET | Freq: Every day | ORAL | Status: AC

## 2021-06-09 MED ORDER — EXEMESTANE 25 MG PO TABS: ORAL_TABLET | ORAL | 2 refills | Status: DC

## 2021-06-09 MED ORDER — TURMERIC 500 MG PO CAPS: 1.0000 | ORAL_CAPSULE | Freq: Every day | ORAL | Status: DC

## 2021-06-09 MED ORDER — VITAMIN C 1000 MG PO TABS: 1000.0000 mg | ORAL_TABLET | Freq: Every day | ORAL | Status: DC

## 2021-06-09 NOTE — Progress Notes (Addendum)
Patient Care Team: [name], MD as PCP - General (Internal Medicine) Minus Breeding, MD as PCP - Cardiology (Cardiology) [name], MD as Consulting Physician (General Surgery) [name], MD as Consulting Physician (Hematology and Oncology) [name], MD as Consulting Physician (Radiation Oncology) [name], MD as Consulting Physician (Obstetrics and Gynecology) [name], NP as Nurse Practitioner (Hematology and Oncology)  DIAGNOSIS:  Encounter Diagnosis  Name Primary?   Malignant neoplasm of upper-outer quadrant of right breast in female, estrogen receptor positive ([place])     SUMMARY OF ONCOLOGIC HISTORY: Oncology History  Breast cancer of upper-outer quadrant of right female breast ([place])  03/10/2014 Mammogram   Right breast: mass   03/10/2014 Breast US   Right breast: 1 cm irregular mass, UOQ, anterior depth, hypoechoic with posterior acoustic shadowing.  LN with focal cortex thickening in right axilla.   03/12/2014 Initial Biopsy   Right breast core needle bx: Invasive ductal carcinoma with DCIS, right axillary lymph node biopsy negative, ER+ (100%), PR+ (84%), Ki-67 16%, HER-2 negative (ratio 1.22)   03/20/2014 Breast MRI   Right breast UOQ: 2.6 x 1.9 x 1.7 cm invasive ductal carcinoma with extension and involvement of the adjacent skin and nipple area complex, no abnormal lymph nodes   03/21/2014 Clinical Stage   Stage IA (T1b, N0, cM0)   04/20/2014 Definitive Surgery   Right breast lumpectomy/SLNB([name]): IDC grade 2; 2.5 cm with intermediate to high-grade DCIS margins negative: 1/3 positive sentinel lymph node with extracapsular extension, ER 100%, PR 84%, HER-2 negative ratio 1.22, Ki-67 16%   04/20/2014 Pathologic Stage    Stage IIB: pT2, pN1a, cM0    06/04/2014 - 10/22/2014 Adjuvant Chemotherapy   Dose dense doxorubicin and cyclophosphamide x 4 cycles followed by paclitaxel x 1 then nab-paclitaxel x 11 (total  12 cycles)   11/23/2014 - 01/07/2015 Radiation Therapy   Adjuvant RT [name]): Right breast / 45 Gray over 25 fractions; Right breast boost / 16 Gray over 8 fractions   02/17/2015 -  Anti-estrogen oral therapy   Anastrozole 1 mg daily. Switched to letrozole and then switched to exemestane 02/16/2016   04/01/2015 Survivorship   Survivorship care plan completed and given to patient.     CHIEF COMPLIANT: Follow-up of right breast cancer  INTERVAL HISTORY: Mckenzie Sanford is a 66 year old above-mentioned history of right breast cancer treated with right lumpectomy followed by adjuvant chemotherapy and radiation and is currently on antiestrogen therapy.  She is tolerating antiestrogen therapy reasonably well.  She is here for annual follow-up.   ALLERGIES:  is allergic to shellfish allergy, ivp dye [iodinated contrast media], penicillin g, and penicillins.  MEDICATIONS:  Current Outpatient Medications  Medication Sig Dispense Refill   acetaminophen (TYLENOL 8 HOUR) 650 MG CR tablet Take 650 mg by mouth every 8 (eight) hours as needed for pain.     amLODipine (NORVASC) 10 MG tablet Take 5 mg by mouth at bedtime.      atorvastatin (LIPITOR) 20 MG tablet Take 20 mg by mouth at bedtime.      Biotin 2500 MCG CAPS Take 2,500 mcg by mouth daily.     cholecalciferol (VITAMIN D) 1000 units tablet Take 1 tablet (1,000 Units total) by mouth daily. (Patient taking differently: Take 1,000 Units by mouth at bedtime. Vitamin D3)     clindamycin (CLEOCIN) 150 MG capsule Take 1 capsule (150 mg total) by mouth daily as needed.     Cod Liver Oil 5000-500 UNIT/5ML OIL Take 5 mLs by  mouth daily. (Patient taking differently: Take 5 mLs by mouth daily as needed (immune system boost). )  0   diphenhydrAMINE (BENADRYL) 25 mg capsule Take 25 mg by mouth every 6 (six) hours as needed for itching.      exemestane (AROMASIN) 25 MG tablet Once daily 90 tablet 3   irbesartan (AVAPRO) 300 MG tablet Take 150 mg by mouth  2 (two) times daily.      linaclotide (LINZESS) 290 MCG CAPS capsule Take 290 mcg by mouth daily as needed (IBS symptoms).     Melatonin 3 MG TABS Take 3 mg by mouth at bedtime as needed (sleep).     metoprolol succinate (TOPROL-XL) 50 MG 24 hr tablet Take 50 mg by mouth daily. Take with or immediately following a meal.      naproxen sodium (ALEVE) 220 MG tablet Take 440 mg by mouth daily as needed (pain/headache).     Polyethyl Glycol-Propyl Glycol (SYSTANE OP) Place 1 drop into both eyes 3 (three) times daily as needed (Dry eyes).      sertraline (ZOLOFT) 50 MG tablet Take 1 tablet (50 mg total) by mouth daily. (Patient taking differently: Take 50 mg by mouth at bedtime. )     No current facility-administered medications for this visit.    PHYSICAL EXAMINATION: ECOG PERFORMANCE STATUS: 1 - Symptomatic but completely ambulatory  Vitals:   06/09/21 1432  BP: (!) 153/67  Pulse: 72  Resp: 18  Temp: (!) 97.5 F (36.4 C)  SpO2: 98%   Filed Weights   06/09/21 1432  Weight: 131 lb 6.4 oz (59.6 kg)    BREAST: No palpable masses or nodules in either right or left breasts. No palpable axillary supraclavicular or infraclavicular adenopathy no breast tenderness or nipple discharge. (exam performed in the presence of a chaperone)  LABORATORY DATA:  I have reviewed the data as listed CMP Latest Ref Rng & Units 12/25/2017 12/24/2017 12/17/2017  Glucose 70 - 99 mg/dL 96 130(H) 88  BUN 6 - 20 mg/dL 15 21(H) 11  Creatinine 0.44 - 1.00 mg/dL 0.82 1.00 0.68  Sodium 135 - 145 mmol/L 141 137 140  Potassium 3.5 - 5.1 mmol/L 3.8 2.5(LL) 3.3(L)  Chloride 98 - 111 mmol/L 106 99 100  CO2 22 - 32 mmol/L 27 26 32  Calcium 8.9 - 10.3 mg/dL 8.9 9.5 10.2  Total Protein 6.5 - 8.1 g/dL 6.0(L) - -  Total Bilirubin 0.3 - 1.2 mg/dL 0.4 - -  Alkaline Phos 38 - 126 U/L 77 - -  AST 15 - 41 U/L 22 - -  ALT 0 - 44 U/L 45(H) - -    Lab Results  Component Value Date   WBC 7.8 12/25/2017   HGB 13.5 12/25/2017    HCT 40.5 12/25/2017   MCV 91.2 12/25/2017   PLT 344 12/25/2017   NEUTROABS 4.1 12/25/2017    ASSESSMENT & PLAN:  Breast cancer of upper-outer quadrant of right female breast ([place]) 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 Breast reduction surgery November 2018: Benign --------------------------------------------------------------------------------------------------------------------------------------------------------  Current treatment: anastrozole 1 mg daily 7 years started 02/16/2015, Switched to letrozole, switched to  exemestane on 02/16/2016 (due to severe hot flashes myalgias)   Exemestane toxicities: 1.Mild to moderate tendinitis in the left wrist: Status carpal tunnel surgery, healing from that surgery now 2. intermittent severe hot flashes 3. Fatigue  She appears to be tolerating exemestane significantly better. I recommended extending her antiestrogen therapy for at least 7 years which will be completed by September 2023 I instructed her to stop antiestrogen therapy at that time.  However she is slightly reluctant to stopping it.   [facility] done at [facility] revealed a T score of -2: Osteopenia: I encouraged her to take calcium and vitamin D and weightbearing exercises.   Breast cancer surveillance: 1.  Breast exam 06/09/21: Benign 2.  Mammogram: [facility]: June 2022: Benign   Return to clinic in 1 year for follow-up and after that she could be seen at the survivorship long-term clinic or to her primary care physician and gynecology.     No orders of the defined types were placed in  this encounter.  The patient has a good understanding of the overall plan. she agrees with it. she will call with any problems that may develop before the next visit here. Total time spent: 30 mins including face to face time and time spent for planning, charting and co-ordination of care   Shalom Ohara, MD 06/09/21

## 2021-06-14 ENCOUNTER — Other Ambulatory Visit: Payer: Self-pay | Admitting: *Deleted

## 2021-06-14 MED ORDER — EXEMESTANE 25 MG PO TABS: ORAL_TABLET | ORAL | 3 refills | Status: DC

## 2021-09-07 ENCOUNTER — Ambulatory Visit (INDEPENDENT_AMBULATORY_CARE_PROVIDER_SITE_OTHER): Payer: BC Managed Care – PPO

## 2021-09-07 ENCOUNTER — Ambulatory Visit: Payer: BC Managed Care – PPO | Admitting: Podiatry

## 2021-09-07 ENCOUNTER — Encounter: Payer: Self-pay | Admitting: Podiatry

## 2021-09-07 DIAGNOSIS — M722 Plantar fascial fibromatosis: Secondary | ICD-10-CM | POA: Diagnosis not present

## 2021-09-07 MED ORDER — MELOXICAM 15 MG PO TABS: 15.0000 mg | ORAL_TABLET | Freq: Every day | ORAL | 0 refills | Status: DC

## 2021-09-07 NOTE — Progress Notes (Signed)
?  Subjective:  ?Patient ID: Mckenzie Sanford, female    DOB: 02-Jul-1955,   MRN: [id number] ? ?No chief complaint on file. ? ? ?66 y.o. female presents for bilateral heel pain. Relates she started to have issues after a long walk and the next days it is hard to put pressure on the heels.  Relates first steps in morning are worst. Has tried aleve with some help. Denies any other pedal complaints. Denies n/v/f/c.  ? ?Past Medical History:  ?Diagnosis Date  ? Anxiety   ? 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 Breast cancer ([place]) 03/12/14  ? right/invasive ductal carcinoma, DCIS  ? Depression   ? Difficulty sleeping   ? takes Lorazepam as needed  ? HTN (hypertension)   ? Hypercholesteremia   ? IBS (irritable bowel syndrome)   ? Radiation 11/23/14-01/07/15  ? Right breast  ? ? ?Objective:  ?Physical Exam: ?Vascular: DP/PT pulses 2/4 bilateral. CFT <3 seconds. Normal hair growth on digits. No edema.  ?Skin. No lacerations or abrasions bilateral feet.  ?Musculoskeletal: MMT 5/5 bilateral lower extremities in DF, PF, Inversion and Eversion. Deceased ROM in DF of ankle joint. Tender to medial calcaneal tuberlce bilateral. No pain with calcaneal squeeze. No pain to achilles or medial arch or along PT tendon.  ?Neurological: Sensation intact to light touch.  ? ?Assessment:  ? ?1. Bilateral plantar fasciitis   ? ? ? ?Plan:  ?Patient was evaluated and treated and all questions answered. ?Discussed plantar fasciitis with patient.  ?X-rays reviewed and discussed with patient. No acute fractures or dislocations noted. Mild spurring noted at inferior calcaneus.  ?Discussed treatment options including, ice, NSAIDS, supportive shoes, bracing, and stretching. Stretching exercises provided to be done on a daily basis.   ?Prescription for meloxicam provided and sent to pharmacy. ?PF brace dispensed.   ?Follow-up 6 weeks or sooner if any problems arise. In the meantime, encouraged to call the office with any questions, concerns, change in symptoms.   ? ? ? ?Lorenda Peck, DPM  ? ? ?

## 2021-09-07 NOTE — Patient Instructions (Signed)

## 2021-10-10 ENCOUNTER — Other Ambulatory Visit: Payer: Self-pay | Admitting: Podiatry

## 2021-10-10 NOTE — Telephone Encounter (Signed)
Please advise 

## 2021-10-19 ENCOUNTER — Ambulatory Visit: Payer: BC Managed Care – PPO | Admitting: Podiatry

## 2021-10-19 ENCOUNTER — Encounter: Payer: Self-pay | Admitting: Podiatry

## 2021-10-19 DIAGNOSIS — M722 Plantar fascial fibromatosis: Secondary | ICD-10-CM | POA: Diagnosis not present

## 2021-10-19 NOTE — Progress Notes (Signed)
  Subjective:  Patient ID: [name], female    DOB: 02-May-1956,   MRN: [id number]  No chief complaint on file.   66 y.o. female presents for right plantar fascitis  Relates she is maybe around 50% better but still having trouble. She is interested in orthotics today. Relates she does still get pain at the end of the day. Meloxicam and brace did not help. Denies any other pedal complaints. Denies n/v/f/c.   Past Medical History:  Diagnosis Date   Anxiety    Arthritis    in back   Breast cancer ([place]) 03/12/14   right/invasive ductal carcinoma, DCIS   Depression    Difficulty sleeping    takes Lorazepam as needed   HTN (hypertension)    Hypercholesteremia    IBS (irritable bowel syndrome)    Radiation 11/23/14-01/07/15   Right breast    Objective:  Physical Exam: Vascular: DP/PT pulses 2/4 bilateral. CFT <3 seconds. Normal hair growth on digits. No edema.  Skin. No lacerations or abrasions bilateral feet.  Musculoskeletal: MMT 5/5 bilateral lower extremities in DF, PF, Inversion and Eversion. Deceased ROM in DF of ankle joint. Tender to medial calcaneal tuberlce bilateral. No pain with calcaneal squeeze. No pain to achilles or medial arch or along PT tendon.  Neurological: Sensation intact to light touch.   Assessment:   1. Bilateral plantar fasciitis       Plan:  Patient was evaluated and treated and all questions answered. Discussed plantar fasciitis with patient.  X-rays reviewed and discussed with patient. No acute fractures or dislocations noted. Mild spurring noted at inferior calcaneus.  Discussed treatment options including, ice, NSAIDS, supportive shoes, bracing, and stretching.  Continue stretching  Will appoint for CMO  Deferred injection today.    Follow-up 3 months or sooner if any problems arise. In the meantime, encouraged to call the office with any questions, concerns, change in symptoms.     Lorenda Peck, DPM

## 2021-11-01 ENCOUNTER — Other Ambulatory Visit: Payer: BC Managed Care – PPO

## 2022-01-25 ENCOUNTER — Ambulatory Visit: Payer: BC Managed Care – PPO | Admitting: Podiatry

## 2022-06-05 ENCOUNTER — Other Ambulatory Visit: Payer: Self-pay | Admitting: Hematology and Oncology

## 2022-06-13 ENCOUNTER — Telehealth: Payer: Self-pay | Admitting: Hematology and Oncology

## 2022-06-13 NOTE — Telephone Encounter (Signed)
Per 1/16 IB, left message

## 2022-06-15 ENCOUNTER — Inpatient Hospital Stay: Payer: BC Managed Care – PPO | Admitting: Hematology and Oncology

## 2022-06-20 ENCOUNTER — Ambulatory Visit: Payer: BC Managed Care – PPO | Admitting: Hematology and Oncology

## 2022-06-28 NOTE — Assessment & Plan Note (Signed)
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 Breast reduction surgery November 2018: Benign --------------------------------------------------------------------------------------------------------------------------------------------------------  Current treatment: anastrozole 1 mg daily 7 years started 02/16/2015, Switched to letrozole, switched to exemestane on 02/16/2016 (due to severe hot flashes myalgias)   Exemestane toxicities: 1.Mild to moderate tendinitis in the left wrist: Status carpal tunnel surgery, healing from that surgery now 2. intermittent severe hot flashes 3. Fatigue  She appears to be tolerating exemestane significantly better. She completed 7 years of therapy by September 2023.  However she is slightly reluctant to stopping it.   B[facility] density test done at [facility] revealed a T score of -2: Osteopenia: I encouraged her to take calcium and vitamin D and weightbearing exercises.   Breast cancer surveillance: 1.  Breast exam 06/29/2022: Benign 2.  Mammogram: [facility]: June 2023: Benign   Return to clinic on an as-needed basis

## 2022-06-29 ENCOUNTER — Inpatient Hospital Stay: Payer: BC Managed Care – PPO | Attending: Hematology and Oncology | Admitting: Hematology and Oncology

## 2022-06-29 ENCOUNTER — Other Ambulatory Visit: Payer: Self-pay

## 2022-06-29 VITALS — BP 143/81 | HR 67 | Temp 97.3°F | Resp 18 | Wt 133.4 lb

## 2022-06-29 DIAGNOSIS — Z9221 Personal history of antineoplastic chemotherapy: Secondary | ICD-10-CM | POA: Diagnosis not present

## 2022-06-29 DIAGNOSIS — Z923 Personal history of irradiation: Secondary | ICD-10-CM | POA: Diagnosis not present

## 2022-06-29 DIAGNOSIS — Z79811 Long term (current) use of aromatase inhibitors: Secondary | ICD-10-CM | POA: Insufficient documentation

## 2022-06-29 DIAGNOSIS — Z79899 Other long term (current) drug therapy: Secondary | ICD-10-CM | POA: Diagnosis not present

## 2022-06-29 DIAGNOSIS — Z170 Estrogen receptor positive status [ER+]: Secondary | ICD-10-CM

## 2022-06-29 DIAGNOSIS — C50411 Malignant neoplasm of upper-outer quadrant of right female breast: Secondary | ICD-10-CM | POA: Insufficient documentation

## 2022-06-29 NOTE — Progress Notes (Signed)
Patient Care Team: [name], MD as PCP - General (Internal Medicine) Minus Breeding, MD as PCP - Cardiology (Cardiology) [name], MD as Consulting Physician (General Surgery) [name], MD as Consulting Physician (Hematology and Oncology) [name], MD as Consulting Physician (Radiation Oncology) [name], MD as Consulting Physician (Obstetrics and Gynecology) [name], NP as Nurse Practitioner (Hematology and Oncology)  DIAGNOSIS:  Encounter Diagnosis  Name Primary?   Malignant neoplasm of upper-outer quadrant of right breast in female, estrogen receptor positive ([place]) Yes    SUMMARY OF ONCOLOGIC HISTORY: Oncology History  Breast cancer of upper-outer quadrant of right female breast ([place])  03/10/2014 Mammogram   Right breast: mass   03/10/2014 Breast US   Right breast: 1 cm irregular mass, UOQ, anterior depth, hypoechoic with posterior acoustic shadowing.  LN with focal cortex thickening in right axilla.   03/12/2014 Initial Biopsy   Right breast core needle bx: Invasive ductal carcinoma with DCIS, right axillary lymph node biopsy negative, ER+ (100%), PR+ (84%), Ki-67 16%, HER-2 negative (ratio 1.22)   03/20/2014 Breast MRI   Right breast UOQ: 2.6 x 1.9 x 1.7 cm invasive ductal carcinoma with extension and involvement of the adjacent skin and nipple area complex, no abnormal lymph nodes   03/21/2014 Clinical Stage   Stage IA (T1b, N0, cM0)   04/20/2014 Definitive Surgery   Right breast lumpectomy/SLNB([name]): IDC grade 2; 2.5 cm with intermediate to high-grade DCIS margins negative: 1/3 positive sentinel lymph node with extracapsular extension, ER 100%, PR 84%, HER-2 negative ratio 1.22, Ki-67 16%   04/20/2014 Pathologic Stage    Stage IIB: pT2, pN1a, cM0    06/04/2014 - 10/22/2014 Adjuvant Chemotherapy   Dose dense doxorubicin and cyclophosphamide x 4 cycles followed by paclitaxel x 1 then nab-paclitaxel x 11  (total 12 cycles)   11/23/2014 - 01/07/2015 Radiation Therapy   Adjuvant RT [name]): Right breast / 45 Gray over 25 fractions; Right breast boost / 16 Gray over 8 fractions   02/17/2015 -  Anti-estrogen oral therapy   Anastrozole 1 mg daily. Switched to letrozole and then switched to exemestane 02/16/2016   04/01/2015 Survivorship   Survivorship care plan completed and given to patient.     CHIEF COMPLIANT: Follow-up of right breast cancer on exemestane   INTERVAL HISTORY: Mckenzie Sanford is a 67 year old above-mentioned history of right breast cancer treated with right lumpectomy followed by adjuvant chemotherapy and radiation and is currently on antiestrogen therapy. She is tolerating antiestrogen therapy reasonably well. She is here for annual follow-up. She reports chronic pain. She says she can not bend her hands. She has joint pain and stiffness. She complains of blood pressure. She says she can't sleep at night. She goes to bed between 1-4 at night. She says she is real active and she can't sleep. She gets very fatigue and out of breath when she is doing activities or cleaning things around the house. Ex. Storage houses.   ALLERGIES:  is allergic to shellfish allergy, ivp dye [iodinated contrast media], penicillin g, and penicillins.  MEDICATIONS:  Current Outpatient Medications  Medication Sig Dispense Refill   acetaminophen (TYLENOL 8 HOUR) 650 MG CR tablet Take 650 mg by mouth every 8 (eight) hours as needed for pain.     amLODipine (NORVASC) 10 MG tablet Take 5 mg by mouth at bedtime.      Ascorbic Acid (VITAMIN C) 1000 MG tablet Take 1 tablet (1,000 mg total) by mouth daily.     atorvastatin (  LIPITOR) 20 MG tablet Take 20 mg by mouth at bedtime.      Biotin 2500 MCG CAPS Take 2,500 mcg by mouth daily.     Cranberry 125 MG TABS Take 1 tablet by mouth daily.     diphenhydrAMINE (BENADRYL) 25 mg capsule Take 25 mg by mouth every 6 (six) hours as needed for itching.       exemestane (AROMASIN) 25 MG tablet TAKE ONE TABLET BY MOUTH ONCE DAILY 90 tablet 0   fluticasone (FLONASE) 50 MCG/ACT nasal spray Place 1 spray into both nostrils daily.  2   irbesartan (AVAPRO) 300 MG tablet Take 150 mg by mouth 2 (two) times daily.      metoprolol succinate (TOPROL-XL) 50 MG 24 hr tablet Take 50 mg by mouth daily. Take with or immediately following a meal.      naproxen sodium (ALEVE) 220 MG tablet Take 440 mg by mouth daily as needed (pain/headache).     Polyethyl Glycol-Propyl Glycol (SYSTANE OP) Place 1 drop into both eyes 3 (three) times daily as needed (Dry eyes).      sertraline (ZOLOFT) 50 MG tablet Take 1 tablet (50 mg total) by mouth daily. (Patient taking differently: Take 50 mg by mouth at bedtime. )     Turmeric 500 MG CAPS Take 1 capsule by mouth daily.     No current facility-administered medications for this visit.    PHYSICAL EXAMINATION: ECOG PERFORMANCE STATUS: 1 - Symptomatic but completely ambulatory  Vitals:   06/29/22 0947  BP: (!) 143/81  Pulse: 67  Resp: 18  Temp: (!) 97.3 F (36.3 C)  SpO2: 98%   Filed Weights   06/29/22 0947  Weight: 133 lb 7 oz (60.5 kg)    BREAST: No palpable masses or nodules in either right or left breasts. No palpable axillary supraclavicular or infraclavicular adenopathy no breast tenderness or nipple discharge. (exam performed in the presence of a chaperone)  LABORATORY DATA:  I have reviewed the data as listed    Latest Ref Rng & Units 12/25/2017    5:38 AM 12/24/2017    4:43 PM 12/17/2017    1:38 PM  CMP  Glucose 70 - 99 mg/dL 96  130  88   BUN 6 - 20 mg/dL '15  21  11   '$ Creatinine 0.44 - 1.00 mg/dL 0.82  1.00  0.68   Sodium 135 - 145 mmol/L 141  137  140   Potassium 3.5 - 5.1 mmol/L 3.8  2.5  3.3   Chloride 98 - 111 mmol/L 106  99  100   CO2 22 - 32 mmol/L 27  26  32   Calcium 8.9 - 10.3 mg/dL 8.9  9.5  10.2   Total Protein 6.5 - 8.1 g/dL 6.0     Total Bilirubin 0.3 - 1.2 mg/dL 0.4     Alkaline Phos  38 - 126 U/L 77     AST 15 - 41 U/L 22     ALT 0 - 44 U/L 45       Lab Results  Component Value Date   WBC 7.8 12/25/2017   HGB 13.5 12/25/2017   HCT 40.5 12/25/2017   MCV 91.2 12/25/2017   PLT 344 12/25/2017   NEUTROABS 4.1 12/25/2017    ASSESSMENT & PLAN:  Breast cancer of upper-outer quadrant of right female breast ([place]) 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 Breast reduction surgery November 2018: Benign --------------------------------------------------------------------------------------------------------------------------------------------------------  Current treatment: anastrozole 1 mg daily 7 years started 02/16/2015, Switched to letrozole, switched to exemestane on 02/16/2016 (due to severe hot flashes myalgias)   Exemestane toxicities: 1.Mild to moderate tendinitis in the left wrist: Discussed with her about adding turmeric supplement 2. intermittent severe hot flashes 3. Fatigue    She completed 7 years of therapy by September 2023.  However she is slightly reluctant to stopping it.   Bone density test done at [facility] revealed a T score of -2: Osteopenia   Breast cancer surveillance: 1.  Breast exam 06/29/2022: Benign 2.  Mammogram: [facility]: June 2023: Benign   Return to clinic in 1 year for follow-up    No orders of the defined types were placed in this encounter.  The patient has a good understanding of the overall plan. she agrees with it. she will call with any problems that may develop before the next visit here. Total time spent: 30 mins including face to face time and  time spent for planning, charting and co-ordination of care   Mckenzie Ohara, MD 06/29/22    I Gardiner Coins am acting as a Education administrator for [employer]  I have reviewed the above documentation for accuracy and completeness, and I agree with the above.

## 2022-07-03 ENCOUNTER — Telehealth: Payer: Self-pay | Admitting: *Deleted

## 2022-07-03 NOTE — Telephone Encounter (Signed)
RANDOMIZED [place] CONTROLLED TRIAL OF BUPROPION FOR CANCER RELATED FATIGUE   Dr. [name] referred this pt to the above clinical trial.  This nurse attempted to reach the patient by phone this afternoon to discuss the study.  The pt was not available, and the nurse left a message asking her to return the nurse's call about an optional study that her physician, Dr. [name], wanted her to consider dealing with cancer-related fatigue.  Will await pt's return call.   Brion Aliment RN, BSN, CCRP Clinical Research Nurse Lead 07/03/2022 4:32 PM

## 2022-07-10 ENCOUNTER — Other Ambulatory Visit: Payer: Self-pay

## 2022-07-10 ENCOUNTER — Emergency Department (HOSPITAL_COMMUNITY)
Admission: EM | Admit: 2022-07-10 | Discharge: 2022-07-10 | Disposition: A | Discharge status: Home / Self Care | Payer: BC Managed Care – PPO | Attending: Emergency Medicine | Admitting: Emergency Medicine

## 2022-07-10 ENCOUNTER — Encounter (HOSPITAL_COMMUNITY): Payer: Self-pay | Admitting: Emergency Medicine

## 2022-07-10 DIAGNOSIS — K625 Hemorrhage of anus and rectum: Secondary | ICD-10-CM | POA: Diagnosis present

## 2022-07-10 DIAGNOSIS — Z79899 Other long term (current) drug therapy: Secondary | ICD-10-CM | POA: Diagnosis not present

## 2022-07-10 DIAGNOSIS — E876 Hypokalemia: Secondary | ICD-10-CM | POA: Diagnosis not present

## 2022-07-10 DIAGNOSIS — I10 Essential (primary) hypertension: Secondary | ICD-10-CM | POA: Insufficient documentation

## 2022-07-10 LAB — URINALYSIS, ROUTINE W REFLEX MICROSCOPIC
Bacteria, UA: NONE SEEN
Bilirubin Urine: NEGATIVE
Glucose, UA: NEGATIVE mg/dL
Hgb urine dipstick: NEGATIVE
Ketones, ur: NEGATIVE mg/dL
Nitrite: NEGATIVE
Protein, ur: NEGATIVE mg/dL
Specific Gravity, Urine: 1.011 (ref 1.005–1.030)
pH: 6 (ref 5.0–8.0)

## 2022-07-10 LAB — CBC
HCT: 41.6 % (ref 36.0–46.0)
Hemoglobin: 13.9 g/dL (ref 12.0–15.0)
MCH: 30.5 pg (ref 26.0–34.0)
MCHC: 33.4 g/dL (ref 30.0–36.0)
MCV: 91.2 fL (ref 80.0–100.0)
Platelets: 305 10*3/uL (ref 150–400)
RBC: 4.56 MIL/uL (ref 3.87–5.11)
RDW: 12.3 % (ref 11.5–15.5)
WBC: 8.5 10*3/uL (ref 4.0–10.5)
nRBC: 0 % (ref 0.0–0.2)

## 2022-07-10 LAB — COMPREHENSIVE METABOLIC PANEL
ALT: 30 U/L (ref 0–44)
AST: 20 U/L (ref 15–41)
Albumin: 4 g/dL (ref 3.5–5.0)
Alkaline Phosphatase: 144 U/L — ABNORMAL HIGH (ref 38–126)
Anion gap: 10 (ref 5–15)
BUN: 13 mg/dL (ref 8–23)
CO2: 26 mmol/L (ref 22–32)
Calcium: 9.6 mg/dL (ref 8.9–10.3)
Chloride: 101 mmol/L (ref 98–111)
Creatinine, Ser: 0.78 mg/dL (ref 0.44–1.00)
GFR, Estimated: >60 mL/min (ref >60)
Glucose, Bld: 101 mg/dL — ABNORMAL HIGH (ref 70–99)
Potassium: 2.8 mmol/L — ABNORMAL LOW (ref 3.5–5.1)
Sodium: 137 mmol/L (ref 135–145)
Total Bilirubin: 0.5 mg/dL (ref 0.3–1.2)
Total Protein: 7.6 g/dL (ref 6.5–8.1)

## 2022-07-10 LAB — LIPASE, BLOOD: Lipase: 33 U/L (ref 11–51)

## 2022-07-10 MED ORDER — POTASSIUM CHLORIDE CRYS ER 20 MEQ PO TBCR
40.0000 meq | EXTENDED_RELEASE_TABLET | Freq: Once | ORAL | Status: AC
  Administered 2022-07-10: 40 meq via ORAL
  Filled 2022-07-10: qty 2

## 2022-07-10 NOTE — ED Notes (Signed)
Pt alert, NAD, calm, interactive, up to b/r, steady gait.

## 2022-07-10 NOTE — Discharge Instructions (Signed)
Follow-up with Dr. Elson Areas your gastroenterologist in the next week.

## 2022-07-10 NOTE — ED Notes (Signed)
EDP at [facility] 

## 2022-07-10 NOTE — ED Provider Notes (Signed)
[facility] Provider Note   CSN: [id number] Arrival date & time: 07/10/22  1301     History  Chief Complaint  Patient presents with   Rectal Bleeding   Abdominal Pain    Mckenzie Sanford is a 67 y.o. female.  Patient with hypertension and elevated cholesterol.  Patient complains of rectal bleeding.  The history is provided by the patient and medical records. No language interpreter was used.  Rectal Bleeding Quality:  Bright red Amount:  Moderate Timing:  Intermittent Chronicity:  Recurrent Context: not anal fissures   Similar prior episodes: no   Relieved by:  Nothing Worsened by:  Nothing Associated symptoms: no abdominal pain   Risk factors: no anticoagulant use        Home Medications Prior to Admission medications   Medication Sig Start Date End Date Taking? Authorizing Provider  Biotin 2500 MCG CAPS Take 5,000 mcg by mouth daily.   Yes [provider]  fluticasone (FLONASE) 50 MCG/ACT nasal spray Place 1 spray into both nostrils daily. Patient taking differently: Place 1 spray into both nostrils daily as needed for allergies or rhinitis. 06/09/21  Yes [name], MD  irbesartan (AVAPRO) 300 MG tablet Take 300 mg by mouth in the morning. 04/26/14  Yes [provider]  metoprolol succinate (TOPROL-XL) 50 MG 24 hr tablet Take 50 mg by mouth daily. Take with or immediately following a meal.    Yes [provider]  naproxen sodium (ALEVE) 220 MG tablet Take 440 mg by mouth daily as needed (pain/headache).   Yes [provider]  acetaminophen (TYLENOL 8 HOUR) 650 MG CR tablet Take 650 mg by mouth every 8 (eight) hours as needed for pain.    [provider]  amLODipine (NORVASC) 10 MG tablet Take 5 mg by mouth at bedtime.     [provider]  atorvastatin (LIPITOR) 20 MG tablet Take 20 mg by mouth at bedtime.     [provider]  Cranberry 125 MG TABS Take 1  tablet by mouth daily. 06/09/21   [name], MD  diphenhydrAMINE (BENADRYL) 25 mg capsule Take 25 mg by mouth every 6 (six) hours as needed for itching.     [provider]  exemestane (AROMASIN) 25 MG tablet TAKE ONE TABLET BY MOUTH ONCE DAILY 06/06/22   [name], MD  Polyethyl Glycol-Propyl Glycol (SYSTANE OP) Place 1 drop into both eyes 3 (three) times daily as needed (Dry eyes).     [provider]  sertraline (ZOLOFT) 50 MG tablet Take 1 tablet (50 mg total) by mouth daily. Patient taking differently: Take 50 mg by mouth at bedtime.  05/15/17   [name], MD  Turmeric 500 MG CAPS Take 1 capsule by mouth daily. Patient not taking: Reported on 07/10/2022 06/09/21   [name], MD      Allergies    Shellfish allergy, Ivp dye [iodinated contrast media], Penicillin g, and Penicillins    Review of Systems   Review of Systems  Constitutional:  Negative for appetite change and fatigue.  HENT:  Negative for congestion, ear discharge and sinus pressure.   Eyes:  Negative for discharge.  Respiratory:  Negative for cough.   Cardiovascular:  Negative for chest pain.  Gastrointestinal:  Negative for abdominal pain and diarrhea.       Rectal bleeding  Genitourinary:  Negative for frequency and hematuria.  Musculoskeletal:  Negative for back pain.  Skin:  Negative for rash.  Neurological:  Negative for seizures and headaches.  Psychiatric/Behavioral:  Negative for hallucinations.     Physical Exam Updated Vital Signs BP 138/79   Pulse (!) 53   Temp 98.4 F (36.9 C) (Oral)   Resp (!) 23   Wt 60.5 kg   LMP 09/26/2011   SpO2 99%   BMI 24.40 kg/m  Physical Exam Vitals and nursing note reviewed.  Constitutional:      Appearance: She is well-developed.  HENT:     Head: Normocephalic.     Nose: Nose normal.  Eyes:     General: No scleral icterus.    Conjunctiva/sclera: Conjunctivae normal.  Neck:     Thyroid: No thyromegaly.  Cardiovascular:     Rate  and Rhythm: Normal rate and regular rhythm.     Heart sounds: No murmur heard.    No friction rub. No gallop.  Pulmonary:     Breath sounds: No stridor. No wheezing or rales.  Chest:     Chest wall: No tenderness.  Abdominal:     General: There is no distension.     Tenderness: There is no abdominal tenderness. There is no rebound.  Genitourinary:    Comments: External hemorrhoids seen Musculoskeletal:        General: Normal range of motion.     Cervical back: Neck supple.  Lymphadenopathy:     Cervical: No cervical adenopathy.  Skin:    Findings: No erythema or rash.  Neurological:     Mental Status: She is alert and oriented to person, place, and time.     Motor: No abnormal muscle tone.     Coordination: Coordination normal.  Psychiatric:        Behavior: Behavior normal.     ED Results / Procedures / Treatments   Labs (all labs ordered are listed, but only abnormal results are displayed) Labs Reviewed  COMPREHENSIVE METABOLIC PANEL - Abnormal; Notable for the following components:      Result Value   Potassium 2.8 (*)    Glucose, Bld 101 (*)    Alkaline Phosphatase 144 (*)    All other components within normal limits  URINALYSIS, ROUTINE W REFLEX MICROSCOPIC - Abnormal; Notable for the following components:   Leukocytes,Ua TRACE (*)    All other components within normal limits  LIPASE, BLOOD  CBC    EKG None  Radiology No results found.  Procedures Procedures    Medications Ordered in ED Medications  potassium chloride SA (KLOR-CON M) CR tablet 40 mEq (40 mEq Oral Given 07/10/22 1848)    ED Course/ Medical Decision Making/ A&P                             Medical Decision Making Amount and/or Complexity of Data Reviewed Labs: ordered.  Risk Prescription drug management.  This patient presents to the ED for concern of rectal bleeding, this involves an extensive number of treatment options, and is a complaint that carries with it a high risk of  complications and morbidity.  The differential diagnosis includes colon cancer, hemorrhoids   Co morbidities that complicate the patient evaluation  Hypertension   Additional history obtained:  Additional history obtained from patient External records from outside source obtained and reviewed including hospital records   Lab Tests:  I Ordered, and personally interpreted labs.  The pertinent results include: Potassium 2.8, hemoglobin 13.9   Imaging Studies ordered:  No imaging Cardiac Monitoring: / EKG:  The patient was maintained on a cardiac monitor.  I personally viewed and interpreted the cardiac monitored which showed an underlying rhythm of: Sinus rhythm   Consultations Obtained: No consultant Problem List / ED Course / Critical interventions / Medication management  Hypertension rectal bleeding No medicines given Reevaluation of the patient after these medicines showed that the patient improved I have reviewed the patients home medicines and have made adjustments as needed   Social Determinants of Health:  None   Test / Admission - Considered:  Outpatient sigmoidoscopy may be needed  Patient with rectal bleeding and low potassium.  Stable hemoglobin.  No external hemorrhoids seen.  She is referred back to her gastroenterologist Dr. Rowe Clack        Final Clinical Impression(s) / ED Diagnoses Final diagnoses:  Rectal bleeding    Rx / DC Orders ED Discharge Orders     None         Milton Ferguson, MD 07/14/22 1100

## 2022-07-10 NOTE — ED Triage Notes (Signed)
For the past 2 days pt complains of Abd pain with swelling, bright red blood stools, diarrhea, pain with sitting for long periods of time, abd cramping, nausea and vomiting. Pt denies any hemorrhoids. Denies any fevers.

## 2022-07-10 NOTE — ED Provider Triage Note (Signed)
Emergency Medicine Provider Triage Evaluation Note  Mckenzie Sanford , a 67 y.o. female  was evaluated in triage.  Pt complains of bloody stools since yesterday morning.  Reports bright red blood in her stools x10 since yesterday not associated with rectal pain.  Endorses nausea without vomiting.  History of IBS.  No fever.  Reports lower abdominal pain.  Denies urinary symptoms.  Review of Systems  Positive: As above Negative: As above  Physical Exam  BP 133/71 (BP Location: Left Arm)   Pulse 66   Temp 98.9 F (37.2 C) (Oral)   Resp 16   Wt 60.5 kg   LMP 09/26/2011   SpO2 97%   BMI 24.40 kg/m  Gen:   Awake, no distress   Resp:  Normal effort  MSK:   Moves extremities without difficulty  Other:    Medical Decision Making  Medically screening exam initiated at 2:09 PM.  Appropriate orders placed.  Mckenzie Sanford was informed that the remainder of the evaluation will be completed by another provider, this initial triage assessment does not replace that evaluation, and the importance of remaining in the ED until their evaluation is complete.    Rex Kras, [place] 07/10/22 [phone]

## 2022-07-17 ENCOUNTER — Telehealth: Payer: Self-pay | Admitting: *Deleted

## 2022-07-17 NOTE — Telephone Encounter (Signed)
[facility] [id number]: RANDOMIZED [place] CONTROLLED TRIAL OF BUPROPION FOR CANCER RELATED FATIGUE   The research nurse attempted to reach the pt by phone again to introduce the above study to the pt.  The nurse left a message explaining that this is an optional study.  The pt was informed that if she wants further information regarding this study to contact the nurse.    Brion Aliment RN, BSN, CCRP Clinical Research Nurse Lead 07/17/2022 2:10 PM

## 2022-08-24 ENCOUNTER — Telehealth: Payer: Self-pay | Admitting: Hematology

## 2022-08-24 NOTE — Telephone Encounter (Signed)
Patient called to move 1 year follow up to a Wednesday.

## 2022-09-06 ENCOUNTER — Other Ambulatory Visit: Payer: Self-pay | Admitting: Hematology and Oncology

## 2023-01-08 DIAGNOSIS — Z1231 Encounter for screening mammogram for malignant neoplasm of breast: Secondary | ICD-10-CM | POA: Diagnosis not present

## 2023-01-10 DIAGNOSIS — M79641 Pain in right hand: Secondary | ICD-10-CM | POA: Diagnosis not present

## 2023-01-10 DIAGNOSIS — M25641 Stiffness of right hand, not elsewhere classified: Secondary | ICD-10-CM | POA: Diagnosis not present

## 2023-01-24 DIAGNOSIS — M25641 Stiffness of right hand, not elsewhere classified: Secondary | ICD-10-CM | POA: Diagnosis not present

## 2023-01-24 DIAGNOSIS — M79641 Pain in right hand: Secondary | ICD-10-CM | POA: Diagnosis not present

## 2023-01-31 DIAGNOSIS — M79641 Pain in right hand: Secondary | ICD-10-CM | POA: Diagnosis not present

## 2023-01-31 DIAGNOSIS — M25641 Stiffness of right hand, not elsewhere classified: Secondary | ICD-10-CM | POA: Diagnosis not present

## 2023-02-02 DIAGNOSIS — M8580 Other specified disorders of bone density and structure, unspecified site: Secondary | ICD-10-CM | POA: Diagnosis not present

## 2023-02-02 DIAGNOSIS — I10 Essential (primary) hypertension: Secondary | ICD-10-CM | POA: Diagnosis not present

## 2023-02-02 DIAGNOSIS — E785 Hyperlipidemia, unspecified: Secondary | ICD-10-CM | POA: Diagnosis not present

## 2023-02-09 DIAGNOSIS — E876 Hypokalemia: Secondary | ICD-10-CM | POA: Diagnosis not present

## 2023-02-09 DIAGNOSIS — K589 Irritable bowel syndrome without diarrhea: Secondary | ICD-10-CM | POA: Diagnosis not present

## 2023-02-09 DIAGNOSIS — M8580 Other specified disorders of bone density and structure, unspecified site: Secondary | ICD-10-CM | POA: Diagnosis not present

## 2023-02-09 DIAGNOSIS — R1310 Dysphagia, unspecified: Secondary | ICD-10-CM | POA: Diagnosis not present

## 2023-02-09 DIAGNOSIS — E785 Hyperlipidemia, unspecified: Secondary | ICD-10-CM | POA: Diagnosis not present

## 2023-02-09 DIAGNOSIS — Z23 Encounter for immunization: Secondary | ICD-10-CM | POA: Diagnosis not present

## 2023-02-09 DIAGNOSIS — I10 Essential (primary) hypertension: Secondary | ICD-10-CM | POA: Diagnosis not present

## 2023-02-09 DIAGNOSIS — R82998 Other abnormal findings in urine: Secondary | ICD-10-CM | POA: Diagnosis not present

## 2023-02-09 DIAGNOSIS — C50919 Malignant neoplasm of unspecified site of unspecified female breast: Secondary | ICD-10-CM | POA: Diagnosis not present

## 2023-02-09 DIAGNOSIS — Z0000 Encounter for general adult medical examination without abnormal findings: Secondary | ICD-10-CM | POA: Diagnosis not present

## 2023-02-14 DIAGNOSIS — M25641 Stiffness of right hand, not elsewhere classified: Secondary | ICD-10-CM | POA: Diagnosis not present

## 2023-02-14 DIAGNOSIS — M79641 Pain in right hand: Secondary | ICD-10-CM | POA: Diagnosis not present

## 2023-02-21 DIAGNOSIS — M25641 Stiffness of right hand, not elsewhere classified: Secondary | ICD-10-CM | POA: Diagnosis not present

## 2023-02-21 DIAGNOSIS — M79641 Pain in right hand: Secondary | ICD-10-CM | POA: Diagnosis not present

## 2023-04-02 DIAGNOSIS — M65341 Trigger finger, right ring finger: Secondary | ICD-10-CM | POA: Diagnosis not present

## 2023-04-16 DIAGNOSIS — M79644 Pain in right finger(s): Secondary | ICD-10-CM | POA: Diagnosis not present

## 2023-04-19 DIAGNOSIS — E785 Hyperlipidemia, unspecified: Secondary | ICD-10-CM | POA: Diagnosis not present

## 2023-04-19 DIAGNOSIS — I10 Essential (primary) hypertension: Secondary | ICD-10-CM | POA: Diagnosis not present

## 2023-04-19 DIAGNOSIS — Z79899 Other long term (current) drug therapy: Secondary | ICD-10-CM | POA: Diagnosis not present

## 2023-04-24 DIAGNOSIS — I10 Essential (primary) hypertension: Secondary | ICD-10-CM | POA: Diagnosis not present

## 2023-04-24 DIAGNOSIS — E785 Hyperlipidemia, unspecified: Secondary | ICD-10-CM | POA: Diagnosis not present

## 2023-04-24 DIAGNOSIS — C50919 Malignant neoplasm of unspecified site of unspecified female breast: Secondary | ICD-10-CM | POA: Diagnosis not present

## 2023-04-24 DIAGNOSIS — J309 Allergic rhinitis, unspecified: Secondary | ICD-10-CM | POA: Diagnosis not present

## 2023-04-25 DIAGNOSIS — M25541 Pain in joints of right hand: Secondary | ICD-10-CM | POA: Diagnosis not present

## 2023-04-25 DIAGNOSIS — M65341 Trigger finger, right ring finger: Secondary | ICD-10-CM | POA: Diagnosis not present

## 2023-05-02 DIAGNOSIS — M25541 Pain in joints of right hand: Secondary | ICD-10-CM | POA: Diagnosis not present

## 2023-05-02 DIAGNOSIS — M65341 Trigger finger, right ring finger: Secondary | ICD-10-CM | POA: Diagnosis not present

## 2023-05-09 DIAGNOSIS — M25541 Pain in joints of right hand: Secondary | ICD-10-CM | POA: Diagnosis not present

## 2023-05-09 DIAGNOSIS — M65341 Trigger finger, right ring finger: Secondary | ICD-10-CM | POA: Diagnosis not present

## 2023-05-15 DIAGNOSIS — M25541 Pain in joints of right hand: Secondary | ICD-10-CM | POA: Diagnosis not present

## 2023-05-15 DIAGNOSIS — M65341 Trigger finger, right ring finger: Secondary | ICD-10-CM | POA: Diagnosis not present

## 2023-05-24 DIAGNOSIS — N390 Urinary tract infection, site not specified: Secondary | ICD-10-CM | POA: Diagnosis not present

## 2023-06-13 DIAGNOSIS — M25541 Pain in joints of right hand: Secondary | ICD-10-CM | POA: Diagnosis not present

## 2023-06-13 DIAGNOSIS — M65341 Trigger finger, right ring finger: Secondary | ICD-10-CM | POA: Diagnosis not present

## 2023-06-14 DIAGNOSIS — G5602 Carpal tunnel syndrome, left upper limb: Secondary | ICD-10-CM | POA: Diagnosis not present

## 2023-06-14 DIAGNOSIS — M65341 Trigger finger, right ring finger: Secondary | ICD-10-CM | POA: Diagnosis not present

## 2023-06-14 DIAGNOSIS — Z4789 Encounter for other orthopedic aftercare: Secondary | ICD-10-CM | POA: Diagnosis not present

## 2023-06-25 DIAGNOSIS — M65341 Trigger finger, right ring finger: Secondary | ICD-10-CM | POA: Diagnosis not present

## 2023-06-25 DIAGNOSIS — M25541 Pain in joints of right hand: Secondary | ICD-10-CM | POA: Diagnosis not present

## 2023-06-27 DIAGNOSIS — M25541 Pain in joints of right hand: Secondary | ICD-10-CM | POA: Diagnosis not present

## 2023-06-27 DIAGNOSIS — M65341 Trigger finger, right ring finger: Secondary | ICD-10-CM | POA: Diagnosis not present

## 2023-07-02 ENCOUNTER — Ambulatory Visit: Payer: BC Managed Care – PPO | Admitting: Hematology and Oncology

## 2023-07-02 NOTE — Assessment & Plan Note (Signed)
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 Breast reduction surgery November 2018: Benign --------------------------------------------------------------------------------------------------------------------------------------------------------  Current treatment: anastrozole 1 mg daily 7 years started 02/16/2015, Switched to letrozole, switched to exemestane on 02/16/2016 (due to severe hot flashes myalgias)   Exemestane toxicities: 1.Mild to moderate tendinitis in the left wrist: Discussed with her about adding turmeric supplement 2. intermittent severe hot flashes 3. Fatigue    She completed 7 years of therapy by September 2023.  However she is slightly reluctant to stopping it.   Bone density test done at [facility] revealed a T score of -2: Osteopenia   Breast cancer surveillance: 1.  Breast exam 07/02/2023: Benign 2.  Mammogram: [facility]: June 2023: Benign   Return to clinic in 1 year for follow-up

## 2023-07-04 ENCOUNTER — Inpatient Hospital Stay: Payer: Medicare HMO | Attending: Hematology and Oncology | Admitting: Hematology and Oncology

## 2023-07-04 VITALS — BP 137/66 | HR 62 | Temp 98.6°F | Resp 18 | Ht 62.0 in | Wt 133.7 lb

## 2023-07-04 DIAGNOSIS — Z79811 Long term (current) use of aromatase inhibitors: Secondary | ICD-10-CM | POA: Diagnosis not present

## 2023-07-04 DIAGNOSIS — Z923 Personal history of irradiation: Secondary | ICD-10-CM | POA: Diagnosis not present

## 2023-07-04 DIAGNOSIS — Z79899 Other long term (current) drug therapy: Secondary | ICD-10-CM | POA: Diagnosis not present

## 2023-07-04 DIAGNOSIS — C50411 Malignant neoplasm of upper-outer quadrant of right female breast: Secondary | ICD-10-CM | POA: Diagnosis present

## 2023-07-04 DIAGNOSIS — C773 Secondary and unspecified malignant neoplasm of axilla and upper limb lymph nodes: Secondary | ICD-10-CM | POA: Diagnosis not present

## 2023-07-04 DIAGNOSIS — Z780 Asymptomatic menopausal state: Secondary | ICD-10-CM | POA: Diagnosis not present

## 2023-07-04 DIAGNOSIS — Z170 Estrogen receptor positive status [ER+]: Secondary | ICD-10-CM | POA: Diagnosis not present

## 2023-07-04 DIAGNOSIS — Z1732 Human epidermal growth factor receptor 2 negative status: Secondary | ICD-10-CM | POA: Diagnosis not present

## 2023-07-04 DIAGNOSIS — M8580 Other specified disorders of bone density and structure, unspecified site: Secondary | ICD-10-CM | POA: Insufficient documentation

## 2023-07-04 DIAGNOSIS — Z1721 Progesterone receptor positive status: Secondary | ICD-10-CM | POA: Insufficient documentation

## 2023-07-04 NOTE — Progress Notes (Signed)
 Patient Care Team: Avva, Ravisankar, MD as PCP - General (Internal Medicine) [name], MD as PCP - Cardiology (Cardiology) Lily Boas, MD as Consulting Physician (General Surgery) Odean Potts, MD as Consulting Physician (Hematology and Oncology) Keenan Hastings, MD as Consulting Physician (Radiation Oncology) Rutherford Gain, MD as Consulting Physician (Obstetrics and Gynecology) Moses Powell Hummer, NP as Nurse Practitioner (Hematology and Oncology)  DIAGNOSIS:  Encounter Diagnoses  Name Primary?   Malignant neoplasm of upper-outer quadrant of right breast in female, estrogen receptor positive (HCC) Yes   Postmenopausal     SUMMARY OF ONCOLOGIC HISTORY: Oncology History  Breast cancer of upper-outer quadrant of right female breast (HCC)  03/10/2014 Mammogram   Right breast: mass   03/10/2014 Breast US    Right breast: 1 cm irregular mass, UOQ, anterior depth, hypoechoic with posterior acoustic shadowing.  LN with focal cortex thickening in right axilla.   03/12/2014 Initial Biopsy   Right breast core needle bx: Invasive ductal carcinoma with DCIS, right axillary lymph node biopsy negative, ER+ (100%), PR+ (84%), Ki-67 16%, HER-2 negative (ratio 1.22)   03/20/2014 Breast MRI   Right breast UOQ: 2.6 x 1.9 x 1.7 cm invasive ductal carcinoma with extension and involvement of the adjacent skin and nipple area complex, no abnormal lymph nodes   03/21/2014 Clinical Stage   Stage IA (T1b, N0, cM0)   04/20/2014 Definitive Surgery   Right breast lumpectomy/SLNB([name]): IDC grade 2; 2.5 cm with intermediate to high-grade DCIS margins negative: 1/3 positive sentinel lymph node with extracapsular extension, ER 100%, PR 84%, HER-2 negative ratio 1.22, Ki-67 16%   04/20/2014 Pathologic Stage    Stage IIB: pT2, pN1a, cM0    06/04/2014 - 10/22/2014 Adjuvant Chemotherapy   Dose dense doxorubicin  and cyclophosphamide  x 4 cycles followed by paclitaxel  x 1 then  nab-paclitaxel  x 11 (total 12 cycles)   11/23/2014 - 01/07/2015 Radiation Therapy   Adjuvant RT Signe): Right breast / 45 Gray over 25 fractions; Right breast boost / 16 Gray over 8 fractions   02/17/2015 -  Anti-estrogen oral therapy   Anastrozole  1 mg daily. Switched to letrozole  and then switched to exemestane  02/16/2016   04/01/2015 Survivorship   Survivorship care plan completed and given to patient.     CHIEF COMPLIANT: Follow-up on exemestane   HISTORY OF PRESENT ILLNESS:  History of Present Illness   Mckenzie Sanford is a 68 year old female with breast cancer who presents for follow-up regarding her treatment with exemestane .  She is currently taking exemestane , which she finds effective, but experiences increased fatigue and possible hair graying as side effects. The fatigue necessitates daytime naps, interfering with her nighttime sleep, keeping her awake until 4 or 5 AM.  She is concerned about the potential spread of cancer, as she only has one lymph node in her right breast. She recalls undergoing genetic testing in the past but is unsure of the results, particularly regarding her BRCA1 status.  She is retired and enjoys [employer] projects and traveling with friends and family. She actively engages in social activities, including planning trips and attending events. She finds her medication, exemestane , affordable at $27 per month from a local pharmacy.         ALLERGIES:  is allergic to shellfish allergy, ivp dye [iodinated contrast media], penicillin g, and penicillins.  MEDICATIONS:  Current Outpatient Medications  Medication Sig Dispense Refill   acetaminophen  (TYLENOL  8 HOUR) 650 MG CR tablet Take 650 mg by mouth every 8 (eight) hours as needed for  pain.     amLODipine  (NORVASC ) 10 MG tablet Take 10 mg by mouth in the morning.     atorvastatin  (LIPITOR) 20 MG tablet Take 20 mg by mouth in the morning.     Biotin 2500 MCG CAPS Take 5,000 mcg by mouth daily.      Cholecalciferol (VITAMIN D3) 25 MCG (1000 UT) CAPS Take 1,000 Units by mouth daily.     Cranberry 125 MG TABS Take 1 tablet by mouth daily. (Patient not taking: Reported on 07/10/2022)     CRANBERRY PO Take 168 mg by mouth in the morning and at bedtime.     diphenhydrAMINE  (BENADRYL ) 25 mg capsule Take 25 mg by mouth every 6 (six) hours as needed for itching.      exemestane  (AROMASIN ) 25 MG tablet TAKE ONE TABLET BY MOUTH ONCE DAILY 90 tablet 3   fluticasone  (FLONASE ) 50 MCG/ACT nasal spray Place 1 spray into both nostrils daily. (Patient taking differently: Place 1 spray into both nostrils daily as needed for allergies or rhinitis.)  2   irbesartan  (AVAPRO ) 300 MG tablet Take 300 mg by mouth in the morning.     metoprolol  succinate (TOPROL -XL) 50 MG 24 hr tablet Take 50 mg by mouth daily. Take with or immediately following a meal.      naproxen sodium (ALEVE) 220 MG tablet Take 440 mg by mouth daily as needed (pain/headache).     sertraline  (ZOLOFT ) 50 MG tablet Take 1 tablet (50 mg total) by mouth daily. (Patient taking differently: Take 50 mg by mouth in the morning.)     SYSTANE ULTRA PF 0.4-0.3 % SOLN Place 1 drop into both eyes 4 (four) times daily as needed (for dryness).     No current facility-administered medications for this visit.    PHYSICAL EXAMINATION: ECOG PERFORMANCE STATUS: 1 - Symptomatic but completely ambulatory  Vitals:   07/04/23 0954  BP: 137/66  Pulse: 62  Resp: 18  Temp: 98.6 F (37 C)   Filed Weights   07/04/23 0954  Weight: 133 lb 11.2 oz (60.6 kg)    Physical Exam no lumps or nodules in bilateral breasts or chest wall or axilla        (exam performed in the presence of a chaperone)  LABORATORY DATA:  I have reviewed the data as listed    Latest Ref Rng & Units 07/10/2022    2:26 PM 12/25/2017    5:38 AM 12/24/2017    4:43 PM  CMP  Glucose 70 - 99 mg/dL 898  96  869   BUN 8 - 23 mg/dL 13  15  21    Creatinine 0.44 - 1.00 mg/dL 9.21  9.17  8.99    Sodium 135 - 145 mmol/L 137  141  137   Potassium 3.5 - 5.1 mmol/L 2.8  3.8  2.5   Chloride 98 - 111 mmol/L 101  106  99   CO2 22 - 32 mmol/L 26  27  26    Calcium  8.9 - 10.3 mg/dL 9.6  8.9  9.5   Total Protein 6.5 - 8.1 g/dL 7.6  6.0    Total Bilirubin 0.3 - 1.2 mg/dL 0.5  0.4    Alkaline Phos 38 - 126 U/L 144  77    AST 15 - 41 U/L 20  22    ALT 0 - 44 U/L 30  45      Lab Results  Component Value Date   WBC 8.5 07/10/2022   HGB 13.9 07/10/2022  HCT 41.6 07/10/2022   MCV 91.2 07/10/2022   PLT 305 07/10/2022   NEUTROABS 4.1 12/25/2017    ASSESSMENT & PLAN:  Breast cancer of upper-outer quadrant of right female breast (HCC) 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 Breast reduction surgery November 2018: Benign --------------------------------------------------------------------------------------------------------------------------------------------------------  Current treatment: anastrozole  1 mg daily 7 years started 02/16/2015, Switched to letrozole , switched to exemestane  on 02/16/2016 (due to severe hot flashes myalgias)   Exemestane  toxicities: 1.Mild to moderate tendinitis in the left wrist: Discussed with her about adding turmeric supplement 2. intermittent severe hot flashes 3. Fatigue    She completed 7 years of therapy by September 2023.  However she is slightly reluctant to stopping it.   B[facility] density test done at [facility] revealed a T score of -2: Osteopenia   Breast cancer surveillance: 1.  Breast exam 07/02/2023: Benign 2.  Mammogram: [facility]: 01/08/2023: Benign, breast density  category B   Return to clinic in 1 year for follow-up ------------------------------------- Assessment and Plan    Breast Cancer 68 year old female with breast cancer on exemestane , experiencing increased fatigue and disrupted sleep. Concerned about recurrence. Discussed Gardant Reveal blood test for early detection, covered by most insurances or at no cost to her. - Order Gardant Reveal blood test every six months. - Continue exemestane  therapy.  Fatigue Significant fatigue likely due to exemestane , disrupting nighttime sleep. Advised on shorter naps with alarm use. - Advise shorter naps with alarm use.  General Health Maintenance Mammograms normal. Due for biennial bone density scan. - Order bone density scan.  Follow-up - Schedule follow-up in one year.          Orders Placed This Encounter  Procedures   DG Bone Density    Standing Status:   Future    Expected Date:   10/01/2023    Expiration Date:   07/03/2024    Scheduling Instructions:     At [facility]    Reason for Exam (SYMPTOM  OR DIAGNOSIS REQUIRED):   Postmenopausal    Preferred imaging location?:   External    Release to patient:   Immediate   The patient has a good understanding of the overall plan. she agrees with it. she will call with any problems that may develop before the next visit here. Total time spent: 30 mins including face to face time and time spent for planning, charting and co-ordination of care   Viinay K Marrissa Dai, MD 07/04/23

## 2023-07-05 ENCOUNTER — Telehealth: Payer: Self-pay

## 2023-07-05 NOTE — Telephone Encounter (Signed)
 Per md orders entered for Guardant Reveal and all supported documents faxed to [phone]. Faxed confirmation was received.

## 2023-07-30 ENCOUNTER — Telehealth (INDEPENDENT_AMBULATORY_CARE_PROVIDER_SITE_OTHER): Payer: Self-pay | Admitting: Otolaryngology

## 2023-07-30 NOTE — Telephone Encounter (Signed)
 Confirmed appt and address with patient for 07/31/2023.

## 2023-07-31 ENCOUNTER — Ambulatory Visit (INDEPENDENT_AMBULATORY_CARE_PROVIDER_SITE_OTHER): Payer: BC Managed Care – PPO | Admitting: Otolaryngology

## 2023-07-31 VITALS — BP 134/71 | HR 67 | Ht 61.0 in | Wt 130.0 lb

## 2023-07-31 DIAGNOSIS — H9041 Sensorineural hearing loss, unilateral, right ear, with unrestricted hearing on the contralateral side: Secondary | ICD-10-CM | POA: Diagnosis not present

## 2023-07-31 DIAGNOSIS — H9203 Otalgia, bilateral: Secondary | ICD-10-CM

## 2023-07-31 DIAGNOSIS — H6123 Impacted cerumen, bilateral: Secondary | ICD-10-CM

## 2023-07-31 MED ORDER — OFLOXACIN 0.3 % OT SOLN: 5.0000 [drp] | Freq: Two times a day (BID) | OTIC | Status: AC | PRN

## 2023-07-31 NOTE — Progress Notes (Unsigned)
 Patient ID: Mckenzie Sanford, female   DOB: 1956/05/05, 68 y.o.   MRN: [id number]  Follow-up: Right ear hearing loss  HPI: The patient is a 68 year old female who returns today for her follow-up evaluation.  The patient was previously seen for her right ear hearing loss.  She has been having right ear hearing loss since age 57.  According to the patient, she had an accidental Q-tip trauma at age 25, resulting in right ear hearing loss.  She also has a history of recurrent otitis media.  However, she has no history of otologic surgery.  She was previously seen by Dr. Osborn Coho, and was treated with antibiotic eardrops as needed.  The patient returns today complaining of occasional left ear pain.  She denies any otorrhea.  She also denies any change in her hearing.  Exam:  General: Communicates without difficulty, well nourished, no acute distress. Head: Normocephalic, no evidence injury, no tenderness, facial buttresses intact without stepoff. Face/sinus: No tenderness to palpation and percussion. Facial movement is normal and symmetric. Eyes: PERRL, EOMI. No scleral icterus, conjunctivae clear. Neuro: CN II exam reveals vision grossly intact.  No nystagmus at any point of gaze. Ears: Auricles well formed without lesions.  Bilateral cerumen impaction.  Nose: External evaluation reveals normal support and skin without lesions.  Dorsum is intact.  Anterior rhinoscopy reveals pink mucosa over anterior aspect of inferior turbinates and intact septum.  No purulence noted. Oral:  Oral cavity and oropharynx are intact, symmetric, without erythema or edema.  Mucosa is moist without lesions. Neck: Full range of motion without pain.  There is no significant lymphadenopathy.  No masses palpable.  Thyroid bed within normal limits to palpation.  Parotid glands and submandibular glands equal bilaterally without mass.  Trachea is midline. Neuro:  CN 2-12 grossly intact. Gait normal.   Procedure: Bilateral cerumen  disimpaction Anesthesia: None Description: Under the operating microscope, the cerumen is carefully removed with a combination of cerumen currette, alligator forceps, and suction catheters.  After the cerumen is removed, the TMs are noted to be normal.  No mass, erythema, or lesions. The patient tolerated the procedure well.    Assessment: 1.  Bilateral cerumen impaction.  After the cerumen disimpaction procedure, both tympanic membranes and middle ear spaces are noted to be normal.  No infection is noted today. 2.  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  Plan: 1.  Otomicroscopy with bilateral cerumen disimpaction. 2.  The physical exam findings are reviewed with the patient.  The patient is reassured that no infection is noted today. 3.  Ofloxacin eardrops as needed. 4.  The patient will return for reevaluation in 1 year, sooner if needed.

## 2023-08-01 DIAGNOSIS — H9041 Sensorineural hearing loss, unilateral, right ear, with unrestricted hearing on the contralateral side: Secondary | ICD-10-CM | POA: Insufficient documentation

## 2023-08-01 DIAGNOSIS — H6123 Impacted cerumen, bilateral: Secondary | ICD-10-CM | POA: Insufficient documentation

## 2023-08-09 DIAGNOSIS — K5730 Diverticulosis of large intestine without perforation or abscess without bleeding: Secondary | ICD-10-CM | POA: Diagnosis not present

## 2023-08-09 DIAGNOSIS — R140 Abdominal distension (gaseous): Secondary | ICD-10-CM | POA: Diagnosis not present

## 2023-08-09 DIAGNOSIS — R319 Hematuria, unspecified: Secondary | ICD-10-CM | POA: Diagnosis not present

## 2023-08-09 DIAGNOSIS — K5900 Constipation, unspecified: Secondary | ICD-10-CM | POA: Diagnosis not present

## 2023-08-10 DIAGNOSIS — J309 Allergic rhinitis, unspecified: Secondary | ICD-10-CM | POA: Diagnosis not present

## 2023-08-10 DIAGNOSIS — G4700 Insomnia, unspecified: Secondary | ICD-10-CM | POA: Diagnosis not present

## 2023-08-10 DIAGNOSIS — K625 Hemorrhage of anus and rectum: Secondary | ICD-10-CM | POA: Diagnosis not present

## 2023-08-10 DIAGNOSIS — I10 Essential (primary) hypertension: Secondary | ICD-10-CM | POA: Diagnosis not present

## 2023-08-10 DIAGNOSIS — R310 Gross hematuria: Secondary | ICD-10-CM | POA: Diagnosis not present

## 2023-08-10 DIAGNOSIS — N390 Urinary tract infection, site not specified: Secondary | ICD-10-CM | POA: Diagnosis not present

## 2023-08-10 DIAGNOSIS — K219 Gastro-esophageal reflux disease without esophagitis: Secondary | ICD-10-CM | POA: Diagnosis not present

## 2023-08-20 DIAGNOSIS — R310 Gross hematuria: Secondary | ICD-10-CM | POA: Diagnosis not present

## 2023-08-28 ENCOUNTER — Telehealth (INDEPENDENT_AMBULATORY_CARE_PROVIDER_SITE_OTHER): Payer: Self-pay | Admitting: Otolaryngology

## 2023-08-28 NOTE — Telephone Encounter (Signed)
 Returned patient 's phone call. Left her a voice mail.

## 2023-09-03 ENCOUNTER — Telehealth (INDEPENDENT_AMBULATORY_CARE_PROVIDER_SITE_OTHER): Payer: Self-pay | Admitting: Otolaryngology

## 2023-09-03 ENCOUNTER — Other Ambulatory Visit: Payer: Self-pay | Admitting: Hematology and Oncology

## 2023-09-03 NOTE — Telephone Encounter (Signed)
 Returned patient 's phone call. Left her a voice mail.

## 2023-09-18 DIAGNOSIS — R310 Gross hematuria: Secondary | ICD-10-CM | POA: Diagnosis not present

## 2023-09-18 DIAGNOSIS — N202 Calculus of kidney with calculus of ureter: Secondary | ICD-10-CM | POA: Diagnosis not present

## 2023-09-18 DIAGNOSIS — R102 Pelvic and perineal pain: Secondary | ICD-10-CM | POA: Diagnosis not present

## 2023-09-25 DIAGNOSIS — M48062 Spinal stenosis, lumbar region with neurogenic claudication: Secondary | ICD-10-CM | POA: Diagnosis not present

## 2023-09-25 DIAGNOSIS — M47816 Spondylosis without myelopathy or radiculopathy, lumbar region: Secondary | ICD-10-CM | POA: Diagnosis not present

## 2023-09-25 DIAGNOSIS — M4316 Spondylolisthesis, lumbar region: Secondary | ICD-10-CM | POA: Diagnosis not present

## 2023-09-26 ENCOUNTER — Other Ambulatory Visit: Payer: Self-pay | Admitting: Neurosurgery

## 2023-09-26 DIAGNOSIS — H524 Presbyopia: Secondary | ICD-10-CM | POA: Diagnosis not present

## 2023-09-26 DIAGNOSIS — H5203 Hypermetropia, bilateral: Secondary | ICD-10-CM | POA: Diagnosis not present

## 2023-09-26 DIAGNOSIS — H2513 Age-related nuclear cataract, bilateral: Secondary | ICD-10-CM | POA: Diagnosis not present

## 2023-09-26 DIAGNOSIS — H04123 Dry eye syndrome of bilateral lacrimal glands: Secondary | ICD-10-CM | POA: Diagnosis not present

## 2023-09-26 DIAGNOSIS — M4316 Spondylolisthesis, lumbar region: Secondary | ICD-10-CM

## 2023-09-27 ENCOUNTER — Encounter: Payer: Self-pay | Admitting: Neurosurgery

## 2023-10-04 DIAGNOSIS — N201 Calculus of ureter: Secondary | ICD-10-CM | POA: Diagnosis not present

## 2023-10-13 ENCOUNTER — Ambulatory Visit
Admission: RE | Admit: 2023-10-13 | Discharge: 2023-10-13 | Disposition: A | Discharge status: Home / Self Care | Source: Ambulatory Visit | Attending: Neurosurgery | Admitting: Neurosurgery

## 2023-10-13 DIAGNOSIS — M5126 Other intervertebral disc displacement, lumbar region: Secondary | ICD-10-CM | POA: Diagnosis not present

## 2023-10-13 DIAGNOSIS — M48061 Spinal stenosis, lumbar region without neurogenic claudication: Secondary | ICD-10-CM | POA: Diagnosis not present

## 2023-10-13 DIAGNOSIS — M47816 Spondylosis without myelopathy or radiculopathy, lumbar region: Secondary | ICD-10-CM | POA: Diagnosis not present

## 2023-10-13 DIAGNOSIS — M4316 Spondylolisthesis, lumbar region: Secondary | ICD-10-CM

## 2023-11-23 DIAGNOSIS — M4316 Spondylolisthesis, lumbar region: Secondary | ICD-10-CM | POA: Diagnosis not present

## 2023-11-23 DIAGNOSIS — M48062 Spinal stenosis, lumbar region with neurogenic claudication: Secondary | ICD-10-CM | POA: Diagnosis not present

## 2023-11-23 DIAGNOSIS — M47816 Spondylosis without myelopathy or radiculopathy, lumbar region: Secondary | ICD-10-CM | POA: Diagnosis not present

## 2023-11-28 ENCOUNTER — Other Ambulatory Visit: Payer: Self-pay | Admitting: Neurosurgery

## 2023-12-13 DIAGNOSIS — I10 Essential (primary) hypertension: Secondary | ICD-10-CM | POA: Diagnosis not present

## 2023-12-13 DIAGNOSIS — Z01419 Encounter for gynecological examination (general) (routine) without abnormal findings: Secondary | ICD-10-CM | POA: Diagnosis not present

## 2023-12-13 DIAGNOSIS — Z853 Personal history of malignant neoplasm of breast: Secondary | ICD-10-CM | POA: Diagnosis not present

## 2023-12-13 DIAGNOSIS — Z79811 Long term (current) use of aromatase inhibitors: Secondary | ICD-10-CM | POA: Diagnosis not present

## 2023-12-13 DIAGNOSIS — M8580 Other specified disorders of bone density and structure, unspecified site: Secondary | ICD-10-CM | POA: Diagnosis not present

## 2023-12-17 DIAGNOSIS — M4316 Spondylolisthesis, lumbar region: Secondary | ICD-10-CM | POA: Diagnosis not present

## 2023-12-18 NOTE — Progress Notes (Signed)
 Surgical Instructions   Your procedure is scheduled on Thursday, December 27, 2023. Report to [facility] Main Entrance A at 5:30 A.M., then check in with the Admitting office. Any questions or running late day of surgery: call [phone]  Questions prior to your surgery date: call [phone], Monday-Friday, 8am-4pm. If you experience any cold or flu symptoms such as cough, fever, chills, shortness of breath, etc. between now and your scheduled surgery, please notify us  at the above number.     Remember:  Do not eat or drink after midnight the night before your surgery  Take these medicines the morning of surgery with A SIP OF WATER  amLODipine  (NORVASC )  atorvastatin  (LIPITOR)  exemestane  (AROMASIN )  metoprolol  succinate (TOPROL -XL)  sertraline  (ZOLOFT ) - if it is your day to take it.   May take these medicines IF NEEDED: acetaminophen  (TYLENOL )  diphenhydrAMINE  (BENADRYL )  fluticasone  (FLONASE )  SYSTANE ULTRA PF 0.4-0.3 % SOLN  traMADol  (ULTRAM )    One week prior to surgery, STOP taking any Aspirin (unless otherwise instructed by your surgeon) Aleve, Naproxen, Ibuprofen , Motrin , Advil , Goody's, BC's, all herbal medications, fish oil, and non-prescription vitamins.                     Do NOT Smoke (Tobacco/Vaping) for 24 hours prior to your procedure.  If you use a CPAP at night, you may bring your mask/headgear for your overnight stay.   You will be asked to remove any contacts, glasses, piercing's, hearing aid's, dentures/partials prior to surgery. Please bring cases for these items if needed.    Patients discharged the day of surgery will not be allowed to drive home, and someone needs to stay with them for 24 hours.  SURGICAL WAITING ROOM VISITATION Patients may have no more than 2 support people in the waiting area - these visitors may rotate.   Pre-op nurse will coordinate an appropriate time for 1 ADULT support person, who may not rotate, to accompany patient in  pre-op.  Children under the age of 60 must have an adult with them who is not the patient and must remain in the main waiting area with an adult.  If the patient needs to stay at the hospital during part of their recovery, the visitor guidelines for inpatient rooms apply.  Please refer to the [facility] website for the visitor guidelines for any additional information.   If you received a COVID test during your pre-op visit  it is requested that you wear a mask when out in public, stay away from anyone that may not be feeling well and notify your surgeon if you develop symptoms. If you have been in contact with anyone that has tested positive in the last 10 days please notify you surgeon.      Pre-operative 5 CHG Bathing Instructions   You can play a key role in reducing the risk of infection after surgery. Your skin needs to be as free of germs as possible. You can reduce the number of germs on your skin by washing with CHG (chlorhexidine  gluconate) soap before surgery. CHG is an antiseptic soap that kills germs and continues to kill germs even after washing.   DO NOT use if you have an allergy to chlorhexidine /CHG or antibacterial soaps. If your skin becomes reddened or irritated, stop using the CHG and notify one of our RNs at [phone].   Please shower with the CHG soap starting 4 days before surgery using the following schedule:  Please keep in mind the following:  DO NOT shave, including legs and underarms, starting the day of your first shower.   Place clean sheets on your bed the day you start using CHG soap. Use a clean washcloth (not used since being washed) for each shower. DO NOT sleep with pets once you start using the CHG.   CHG Shower Instructions:  Wash your face and private area with normal soap. If you choose to wash your hair, wash first with your normal shampoo.  After you use shampoo/soap, rinse your hair and body thoroughly to remove shampoo/soap residue.   Turn the water OFF and apply about 3 tablespoons (45 ml) of CHG soap to a CLEAN washcloth.  Apply CHG soap ONLY FROM YOUR NECK DOWN TO YOUR TOES (washing for 3-5 minutes)  DO NOT use CHG soap on face, private areas, open wounds, or sores.  Pay special attention to the area where your surgery is being performed.  If you are having back surgery, having someone wash your back for you may be helpful. Wait 2 minutes after CHG soap is applied, then you may rinse off the CHG soap.  Pat dry with a clean towel  Put on clean clothes/pajamas   If you choose to wear lotion, please use ONLY the CHG-compatible lotions that are listed below.  Additional instructions for the day of surgery: DO NOT APPLY any lotions, deodorants  or perfumes.   Do not bring valuables to the hospital. [facility] is not responsible for any belongings/valuables. Do not wear nail polish, gel polish, artificial nails, or any other type of covering on natural nails (fingers and toes) Do not wear jewelry or makeup Put on clean/comfortable clothes.  Please brush your teeth.  Ask your nurse before applying any prescription medications to the skin.     CHG Compatible Lotions   Aveeno Moisturizing lotion  Cetaphil Moisturizing Cream  Cetaphil Moisturizing Lotion  Clairol Herbal Essence Moisturizing Lotion, Dry Skin  Clairol Herbal Essence Moisturizing Lotion, Extra Dry Skin  Clairol Herbal Essence Moisturizing Lotion, Normal Skin  Curel Age Defying Therapeutic Moisturizing Lotion with Alpha Hydroxy  Curel Extreme Care Body Lotion  Curel Soothing Hands Moisturizing Hand Lotion  Curel Therapeutic Moisturizing Cream, Fragrance-Free  Curel Therapeutic Moisturizing Lotion, Fragrance-Free  Curel Therapeutic Moisturizing Lotion, Original Formula  Eucerin Daily Replenishing Lotion  Eucerin Dry Skin Therapy Plus Alpha Hydroxy Crme  Eucerin Dry Skin Therapy Plus Alpha Hydroxy Lotion  Eucerin Original Crme  Eucerin Original  Lotion  Eucerin Plus Crme Eucerin Plus Lotion  Eucerin TriLipid Replenishing Lotion  Keri Anti-Bacterial Hand Lotion  Keri Deep Conditioning Original Lotion Dry Skin Formula Softly Scented  Keri Deep Conditioning Original Lotion, Fragrance Free Sensitive Skin Formula  Keri Lotion Fast Absorbing Fragrance Free Sensitive Skin Formula  Keri Lotion Fast Absorbing Softly Scented Dry Skin Formula  Keri Original Lotion  Keri Skin Renewal Lotion Keri Silky Smooth Lotion  Keri Silky Smooth Sensitive Skin Lotion  Nivea Body Creamy Conditioning Oil  Nivea Body Extra Enriched Lotion  Nivea Body Original Lotion  Nivea Body Sheer Moisturizing Lotion Nivea Crme  Nivea Skin Firming Lotion  NutraDerm 30 Skin Lotion  NutraDerm Skin Lotion  NutraDerm Therapeutic Skin Cream  NutraDerm Therapeutic Skin Lotion  ProShield Protective Hand Cream  Provon moisturizing lotion  Please read over the following fact sheets that you were given.

## 2023-12-19 ENCOUNTER — Other Ambulatory Visit: Payer: Self-pay

## 2023-12-19 ENCOUNTER — Encounter (HOSPITAL_COMMUNITY): Payer: Self-pay

## 2023-12-19 ENCOUNTER — Encounter (HOSPITAL_COMMUNITY)
Admission: RE | Admit: 2023-12-19 | Discharge: 2023-12-19 | Disposition: A | Discharge status: Home / Self Care | Source: Ambulatory Visit | Attending: Neurosurgery | Admitting: Neurosurgery

## 2023-12-19 VITALS — BP 155/74 | HR 64 | Temp 97.8°F | Resp 17 | Ht 61.0 in | Wt 133.2 lb

## 2023-12-19 DIAGNOSIS — Z01818 Encounter for other preprocedural examination: Secondary | ICD-10-CM | POA: Insufficient documentation

## 2023-12-19 LAB — CBC
HCT: 44.8 % (ref 36.0–46.0)
Hemoglobin: 14.8 g/dL (ref 12.0–15.0)
MCH: 29.5 pg (ref 26.0–34.0)
MCHC: 33 g/dL (ref 30.0–36.0)
MCV: 89.4 fL (ref 80.0–100.0)
Platelets: 343 K/uL (ref 150–400)
RBC: 5.01 MIL/uL (ref 3.87–5.11)
RDW: 11.9 % (ref 11.5–15.5)
WBC: 4.4 K/uL (ref 4.0–10.5)
nRBC: 0 % (ref 0.0–0.2)

## 2023-12-19 LAB — BASIC METABOLIC PANEL WITH GFR
Anion gap: 10 (ref 5–15)
BUN: 8 mg/dL (ref 8–23)
CO2: 27 mmol/L (ref 22–32)
Calcium: 10.1 mg/dL (ref 8.9–10.3)
Chloride: 100 mmol/L (ref 98–111)
Creatinine, Ser: 0.68 mg/dL (ref 0.44–1.00)
GFR, Estimated: >60 mL/min (ref >60)
Glucose, Bld: 97 mg/dL (ref 70–99)
Potassium: 3.5 mmol/L (ref 3.5–5.1)
Sodium: 137 mmol/L (ref 135–145)

## 2023-12-19 LAB — TYPE AND SCREEN
ABO/RH(D): A POS
Antibody Screen: NEGATIVE

## 2023-12-19 LAB — SURGICAL PCR SCREEN
MRSA, PCR: NEGATIVE
Staphylococcus aureus: NEGATIVE

## 2023-12-19 NOTE — Progress Notes (Signed)
 PCP - Janey Santos, MD  Cardiologist -  [name], MD (LOV 03-20-19) (per patient only sees prn)  PPM/ICD - Denies Device Orders - n/a Rep Notified - n/a  Chest x-ray - denies EKG - 12-19-23 Stress Test - 01-04-18 (exercise test) ECHO - 12-25-17 Cardiac Cath - denies  Sleep Study - denies CPAP - n/a  DM -denies  Blood Thinner Instructions:denies Aspirin Instructions:n/a  ERAS Protcol -NPO   COVID TEST- n/a   Anesthesia review: yes ,Hx of HTN review EKG  Patient denies shortness of breath, fever, cough and chest pain at PAT appointment   All instructions explained to the patient, with a verbal understanding of the material. Patient agrees to go over the instructions while at home for a better understanding. Patient also instructed to self quarantine after being tested for COVID-19. The opportunity to ask questions was provided.

## 2023-12-20 DIAGNOSIS — M79642 Pain in left hand: Secondary | ICD-10-CM | POA: Diagnosis not present

## 2023-12-20 DIAGNOSIS — G5602 Carpal tunnel syndrome, left upper limb: Secondary | ICD-10-CM | POA: Diagnosis not present

## 2023-12-20 DIAGNOSIS — Z4789 Encounter for other orthopedic aftercare: Secondary | ICD-10-CM | POA: Diagnosis not present

## 2023-12-27 ENCOUNTER — Encounter (HOSPITAL_COMMUNITY): Payer: Self-pay | Admitting: Neurosurgery

## 2023-12-27 ENCOUNTER — Ambulatory Visit (HOSPITAL_COMMUNITY)

## 2023-12-27 ENCOUNTER — Other Ambulatory Visit: Payer: Self-pay

## 2023-12-27 ENCOUNTER — Ambulatory Visit (HOSPITAL_COMMUNITY)
Admission: RE | Admit: 2023-12-27 | Discharge: 2023-12-28 | Disposition: A | Discharge status: Home / Self Care | Attending: Neurosurgery | Admitting: Neurosurgery

## 2023-12-27 ENCOUNTER — Encounter (HOSPITAL_COMMUNITY)
Admission: RE | Disposition: A | Discharge status: Home / Self Care | Payer: Self-pay | Source: Home / Self Care | Attending: Neurosurgery

## 2023-12-27 ENCOUNTER — Ambulatory Visit (HOSPITAL_COMMUNITY): Admitting: Anesthesiology

## 2023-12-27 ENCOUNTER — Ambulatory Visit (HOSPITAL_COMMUNITY): Payer: Self-pay | Admitting: Physician Assistant

## 2023-12-27 DIAGNOSIS — Z981 Arthrodesis status: Secondary | ICD-10-CM | POA: Diagnosis not present

## 2023-12-27 DIAGNOSIS — Z79899 Other long term (current) drug therapy: Secondary | ICD-10-CM | POA: Insufficient documentation

## 2023-12-27 DIAGNOSIS — F419 Anxiety disorder, unspecified: Secondary | ICD-10-CM | POA: Diagnosis not present

## 2023-12-27 DIAGNOSIS — M4726 Other spondylosis with radiculopathy, lumbar region: Secondary | ICD-10-CM | POA: Diagnosis not present

## 2023-12-27 DIAGNOSIS — M4316 Spondylolisthesis, lumbar region: Secondary | ICD-10-CM

## 2023-12-27 DIAGNOSIS — M48062 Spinal stenosis, lumbar region with neurogenic claudication: Secondary | ICD-10-CM

## 2023-12-27 DIAGNOSIS — I10 Essential (primary) hypertension: Secondary | ICD-10-CM | POA: Diagnosis not present

## 2023-12-27 DIAGNOSIS — F32A Depression, unspecified: Secondary | ICD-10-CM | POA: Insufficient documentation

## 2023-12-27 DIAGNOSIS — M47816 Spondylosis without myelopathy or radiculopathy, lumbar region: Secondary | ICD-10-CM | POA: Diagnosis not present

## 2023-12-27 DIAGNOSIS — Z01818 Encounter for other preprocedural examination: Secondary | ICD-10-CM

## 2023-12-27 DIAGNOSIS — M5116 Intervertebral disc disorders with radiculopathy, lumbar region: Secondary | ICD-10-CM | POA: Insufficient documentation

## 2023-12-27 DIAGNOSIS — M4156 Other secondary scoliosis, lumbar region: Secondary | ICD-10-CM | POA: Diagnosis not present

## 2023-12-27 DIAGNOSIS — M5416 Radiculopathy, lumbar region: Secondary | ICD-10-CM | POA: Diagnosis not present

## 2023-12-27 DIAGNOSIS — Z0189 Encounter for other specified special examinations: Secondary | ICD-10-CM | POA: Diagnosis not present

## 2023-12-27 LAB — ABO/RH: ABO/RH(D): A POS

## 2023-12-27 SURGERY — POSTERIOR LUMBAR FUSION 1 LEVEL
Anesthesia: General

## 2023-12-27 MED ORDER — EXEMESTANE 25 MG PO TABS
25.0000 mg | ORAL_TABLET | Freq: Every day | ORAL | Status: DC
  Filled 2023-12-27: qty 1

## 2023-12-27 MED ORDER — DOCUSATE SODIUM 100 MG PO CAPS
100.0000 mg | ORAL_CAPSULE | Freq: Two times a day (BID) | ORAL | Status: DC
  Administered 2023-12-27 – 2023-12-28 (×2): 100 mg via ORAL
  Filled 2023-12-27 (×2): qty 1

## 2023-12-27 MED ORDER — PROPOFOL 10 MG/ML IV BOLUS
INTRAVENOUS | Status: DC | PRN
  Administered 2023-12-27: 130 mg via INTRAVENOUS

## 2023-12-27 MED ORDER — CHLORHEXIDINE GLUCONATE 0.12 % MT SOLN
OROMUCOSAL | Status: AC
  Administered 2023-12-27: 15 mL via OROMUCOSAL
  Filled 2023-12-27: qty 15

## 2023-12-27 MED ORDER — ONDANSETRON HCL 4 MG/2ML IJ SOLN
INTRAMUSCULAR | Status: DC | PRN
  Administered 2023-12-27: 4 mg via INTRAVENOUS

## 2023-12-27 MED ORDER — ROCURONIUM BROMIDE 10 MG/ML (PF) SYRINGE
PREFILLED_SYRINGE | INTRAVENOUS | Status: DC | PRN
  Administered 2023-12-27: 20 mg via INTRAVENOUS
  Administered 2023-12-27: 50 mg via INTRAVENOUS

## 2023-12-27 MED ORDER — PHENOL 1.4 % MT LIQD: 1.0000 | OROMUCOSAL | Status: DC | PRN

## 2023-12-27 MED ORDER — HYDROMORPHONE HCL 1 MG/ML IJ SOLN
INTRAMUSCULAR | Status: AC
  Filled 2023-12-27: qty 0.5

## 2023-12-27 MED ORDER — ONDANSETRON HCL 4 MG PO TABS: 4.0000 mg | ORAL_TABLET | Freq: Four times a day (QID) | ORAL | Status: DC | PRN

## 2023-12-27 MED ORDER — PROPOFOL 10 MG/ML IV BOLUS
INTRAVENOUS | Status: AC
  Filled 2023-12-27: qty 20

## 2023-12-27 MED ORDER — CHLORHEXIDINE GLUCONATE 0.12 % MT SOLN: 15.0000 mL | Freq: Once | OROMUCOSAL | Status: AC

## 2023-12-27 MED ORDER — KETAMINE HCL 10 MG/ML IJ SOLN
INTRAMUSCULAR | Status: DC | PRN
Start: 2023-12-27 — End: 2023-12-27
  Administered 2023-12-27 (×2): 10 mg via INTRAVENOUS

## 2023-12-27 MED ORDER — FENTANYL CITRATE (PF) 250 MCG/5ML IJ SOLN
INTRAMUSCULAR | Status: DC | PRN
  Administered 2023-12-27: 100 ug via INTRAVENOUS

## 2023-12-27 MED ORDER — ORAL CARE MOUTH RINSE: 15.0000 mL | Freq: Once | OROMUCOSAL | Status: AC

## 2023-12-27 MED ORDER — CHLORHEXIDINE GLUCONATE CLOTH 2 % EX PADS: 6.0000 | MEDICATED_PAD | Freq: Once | CUTANEOUS | Status: DC

## 2023-12-27 MED ORDER — FENTANYL CITRATE (PF) 100 MCG/2ML IJ SOLN
25.0000 ug | INTRAMUSCULAR | Status: DC | PRN
  Administered 2023-12-27 (×3): 25 ug via INTRAVENOUS

## 2023-12-27 MED ORDER — ACETAMINOPHEN 500 MG PO TABS
1000.0000 mg | ORAL_TABLET | Freq: Four times a day (QID) | ORAL | Status: DC
  Administered 2023-12-27 – 2023-12-28 (×3): 1000 mg via ORAL
  Filled 2023-12-27 (×3): qty 2

## 2023-12-27 MED ORDER — OXYCODONE HCL 5 MG PO TABS: 5.0000 mg | ORAL_TABLET | ORAL | Status: DC | PRN

## 2023-12-27 MED ORDER — METOPROLOL SUCCINATE ER 50 MG PO TB24
50.0000 mg | ORAL_TABLET | Freq: Every day | ORAL | Status: DC
  Filled 2023-12-27: qty 1

## 2023-12-27 MED ORDER — BUPIVACAINE LIPOSOME 1.3 % IJ SUSP
INTRAMUSCULAR | Status: AC
Start: 2023-12-27 — End: 2023-12-27
  Filled 2023-12-27: qty 20

## 2023-12-27 MED ORDER — SUGAMMADEX SODIUM 200 MG/2ML IV SOLN
INTRAVENOUS | Status: DC | PRN
  Administered 2023-12-27: 200 mg via INTRAVENOUS

## 2023-12-27 MED ORDER — ACETAMINOPHEN 325 MG PO TABS: 650.0000 mg | ORAL_TABLET | ORAL | Status: DC | PRN

## 2023-12-27 MED ORDER — PANTOPRAZOLE SODIUM 40 MG PO TBEC
40.0000 mg | DELAYED_RELEASE_TABLET | Freq: Every day | ORAL | Status: DC
  Administered 2023-12-27: 40 mg via ORAL
  Filled 2023-12-27: qty 1

## 2023-12-27 MED ORDER — FENTANYL CITRATE (PF) 250 MCG/5ML IJ SOLN
INTRAMUSCULAR | Status: AC
  Filled 2023-12-27: qty 5

## 2023-12-27 MED ORDER — ZOLPIDEM TARTRATE 5 MG PO TABS
5.0000 mg | ORAL_TABLET | Freq: Every evening | ORAL | Status: DC | PRN
Start: 2023-12-27 — End: 2023-12-28

## 2023-12-27 MED ORDER — HYDROMORPHONE HCL 1 MG/ML IJ SOLN
INTRAMUSCULAR | Status: DC | PRN
  Administered 2023-12-27: .5 mg via INTRAVENOUS

## 2023-12-27 MED ORDER — CYCLOBENZAPRINE HCL 10 MG PO TABS
10.0000 mg | ORAL_TABLET | Freq: Three times a day (TID) | ORAL | Status: DC | PRN
  Administered 2023-12-27: 10 mg via ORAL
  Filled 2023-12-27: qty 1

## 2023-12-27 MED ORDER — DEXAMETHASONE SODIUM PHOSPHATE 10 MG/ML IJ SOLN
INTRAMUSCULAR | Status: DC | PRN
  Administered 2023-12-27: 10 mg via INTRAVENOUS

## 2023-12-27 MED ORDER — BACITRACIN ZINC 500 UNIT/GM EX OINT
TOPICAL_OINTMENT | CUTANEOUS | Status: AC
  Filled 2023-12-27: qty 28.35

## 2023-12-27 MED ORDER — PHENYLEPHRINE HCL-NACL 20-0.9 MG/250ML-% IV SOLN
INTRAVENOUS | Status: DC | PRN
  Administered 2023-12-27: 20 ug/min via INTRAVENOUS

## 2023-12-27 MED ORDER — OXYCODONE HCL 5 MG PO TABS
10.0000 mg | ORAL_TABLET | ORAL | Status: DC | PRN
  Administered 2023-12-27 – 2023-12-28 (×4): 10 mg via ORAL
  Filled 2023-12-27 (×4): qty 2

## 2023-12-27 MED ORDER — THROMBIN 5000 UNITS EX SOLR: OROMUCOSAL | Status: DC | PRN

## 2023-12-27 MED ORDER — VANCOMYCIN HCL IN DEXTROSE 1-5 GM/200ML-% IV SOLN
1000.0000 mg | Freq: Once | INTRAVENOUS | Status: AC
  Administered 2023-12-27: 1000 mg via INTRAVENOUS
  Filled 2023-12-27: qty 200

## 2023-12-27 MED ORDER — THROMBIN 5000 UNITS EX KIT
PACK | CUTANEOUS | Status: AC
  Filled 2023-12-27: qty 1

## 2023-12-27 MED ORDER — MORPHINE SULFATE (PF) 2 MG/ML IV SOLN: 2.0000 mg | INTRAVENOUS | Status: DC | PRN

## 2023-12-27 MED ORDER — PANTOPRAZOLE SODIUM 40 MG IV SOLR: 40.0000 mg | Freq: Every day | INTRAVENOUS | Status: DC

## 2023-12-27 MED ORDER — LACTATED RINGERS IV SOLN: INTRAVENOUS | Status: DC | PRN

## 2023-12-27 MED ORDER — ONDANSETRON HCL 4 MG/2ML IJ SOLN: 4.0000 mg | Freq: Once | INTRAMUSCULAR | Status: DC | PRN

## 2023-12-27 MED ORDER — IRBESARTAN 150 MG PO TABS
300.0000 mg | ORAL_TABLET | Freq: Every morning | ORAL | Status: DC
  Administered 2023-12-28: 300 mg via ORAL
  Filled 2023-12-27: qty 2

## 2023-12-27 MED ORDER — DEXAMETHASONE SODIUM PHOSPHATE 4 MG/ML IJ SOLN
4.0000 mg | Freq: Four times a day (QID) | INTRAMUSCULAR | Status: AC
  Administered 2023-12-27: 4 mg via INTRAVENOUS
  Filled 2023-12-27: qty 1

## 2023-12-27 MED ORDER — FENTANYL CITRATE (PF) 100 MCG/2ML IJ SOLN
INTRAMUSCULAR | Status: AC
  Filled 2023-12-27: qty 2

## 2023-12-27 MED ORDER — AMISULPRIDE (ANTIEMETIC) 5 MG/2ML IV SOLN: 10.0000 mg | Freq: Once | INTRAVENOUS | Status: DC | PRN

## 2023-12-27 MED ORDER — LACTATED RINGERS IV SOLN: INTRAVENOUS | Status: DC

## 2023-12-27 MED ORDER — ACETAMINOPHEN 650 MG RE SUPP: 650.0000 mg | RECTAL | Status: DC | PRN

## 2023-12-27 MED ORDER — BUPIVACAINE-EPINEPHRINE (PF) 0.5% -1:200000 IJ SOLN
INTRAMUSCULAR | Status: DC | PRN
  Administered 2023-12-27: 10 mL via PERINEURAL

## 2023-12-27 MED ORDER — ONDANSETRON HCL 4 MG/2ML IJ SOLN: 4.0000 mg | Freq: Four times a day (QID) | INTRAMUSCULAR | Status: DC | PRN

## 2023-12-27 MED ORDER — BUPIVACAINE-EPINEPHRINE (PF) 0.5% -1:200000 IJ SOLN
INTRAMUSCULAR | Status: AC
  Filled 2023-12-27: qty 30

## 2023-12-27 MED ORDER — LIDOCAINE 2% (20 MG/ML) 5 ML SYRINGE
INTRAMUSCULAR | Status: DC | PRN
  Administered 2023-12-27: 80 mg via INTRAVENOUS

## 2023-12-27 MED ORDER — DEXAMETHASONE 4 MG PO TABS
4.0000 mg | ORAL_TABLET | Freq: Four times a day (QID) | ORAL | Status: AC
  Administered 2023-12-28: 4 mg via ORAL
  Filled 2023-12-27: qty 1

## 2023-12-27 MED ORDER — MENTHOL 3 MG MT LOZG: 1.0000 | LOZENGE | OROMUCOSAL | Status: DC | PRN

## 2023-12-27 MED ORDER — BACITRACIN ZINC 500 UNIT/GM EX OINT
TOPICAL_OINTMENT | CUTANEOUS | Status: DC | PRN
  Administered 2023-12-27: 1 via TOPICAL

## 2023-12-27 MED ORDER — AMLODIPINE BESYLATE 10 MG PO TABS
10.0000 mg | ORAL_TABLET | Freq: Every morning | ORAL | Status: DC
  Administered 2023-12-28: 10 mg via ORAL
  Filled 2023-12-27: qty 1

## 2023-12-27 MED ORDER — VASHE WOUND IRRIGATION OPTIME
TOPICAL | Status: DC | PRN
Start: 2023-12-27 — End: 2023-12-27
  Administered 2023-12-27: 34 [oz_av] via TOPICAL

## 2023-12-27 MED ORDER — BUPIVACAINE LIPOSOME 1.3 % IJ SUSP
INTRAMUSCULAR | Status: DC | PRN
  Administered 2023-12-27: 20 mL

## 2023-12-27 MED ORDER — VANCOMYCIN HCL IN DEXTROSE 1-5 GM/200ML-% IV SOLN
INTRAVENOUS | Status: AC
  Administered 2023-12-27: 1000 mg via INTRAVENOUS
  Filled 2023-12-27: qty 200

## 2023-12-27 MED ORDER — 0.9 % SODIUM CHLORIDE (POUR BTL) OPTIME
TOPICAL | Status: DC | PRN
  Administered 2023-12-27: 1000 mL

## 2023-12-27 MED ORDER — SCOPOLAMINE 1 MG/3DAYS TD PT72
1.0000 | MEDICATED_PATCH | Freq: Once | TRANSDERMAL | Status: DC
  Administered 2023-12-27: 1.5 mg via TRANSDERMAL
  Filled 2023-12-27: qty 1

## 2023-12-27 MED ORDER — ALUM & MAG HYDROXIDE-SIMETH 200-200-20 MG/5ML PO SUSP
30.0000 mL | Freq: Four times a day (QID) | ORAL | Status: DC | PRN
Start: 2023-12-27 — End: 2023-12-28

## 2023-12-27 MED ORDER — KETAMINE HCL 50 MG/5ML IJ SOSY
PREFILLED_SYRINGE | INTRAMUSCULAR | Status: AC
  Filled 2023-12-27: qty 5

## 2023-12-27 MED ORDER — VANCOMYCIN HCL IN DEXTROSE 1-5 GM/200ML-% IV SOLN: 1000.0000 mg | INTRAVENOUS | Status: AC

## 2023-12-27 MED ORDER — ATORVASTATIN CALCIUM 10 MG PO TABS
20.0000 mg | ORAL_TABLET | Freq: Every morning | ORAL | Status: DC
  Administered 2023-12-28: 20 mg via ORAL
  Filled 2023-12-27: qty 2

## 2023-12-27 MED ORDER — BISACODYL 10 MG RE SUPP: 10.0000 mg | Freq: Every day | RECTAL | Status: DC | PRN

## 2023-12-27 SURGICAL SUPPLY — 62 items
BAG COUNTER SPONGE SURGICOUNT (BAG) ×1 IMPLANT
BASKET BONE COLLECTION (BASKET) ×1 IMPLANT
BENZOIN TINCTURE PRP APPL 2/3 (GAUZE/BANDAGES/DRESSINGS) ×1 IMPLANT
BLADE CLIPPER SURG (BLADE) IMPLANT
BUR MATCHSTICK NEURO 3.0 LAGG (BURR) ×1 IMPLANT
BUR PRECISION FLUTE 6.0 (BURR) ×1 IMPLANT
CANISTER SUCTION 3000ML PPV (SUCTIONS) ×1 IMPLANT
CAP LOCK DLX THRD (Cap) IMPLANT
CLEANSER WND VASHE INSTL 34OZ (WOUND CARE) ×1 IMPLANT
CNTNR URN SCR LID CUP LEK RST (MISCELLANEOUS) ×1 IMPLANT
COVER BACK TABLE 60X90IN (DRAPES) ×1 IMPLANT
DRAPE C-ARM 42X72 X-RAY (DRAPES) ×2 IMPLANT
DRAPE HALF SHEET 40X57 (DRAPES) ×1 IMPLANT
DRAPE LAPAROTOMY 100X72X124 (DRAPES) ×1 IMPLANT
DRAPE SURG 17X23 STRL (DRAPES) ×1 IMPLANT
DRSG OPSITE 4X5.5 SM (GAUZE/BANDAGES/DRESSINGS) IMPLANT
DRSG OPSITE POSTOP 4X6 (GAUZE/BANDAGES/DRESSINGS) ×1 IMPLANT
ELECTRODE BLDE 4.0 EZ CLN MEGD (MISCELLANEOUS) ×1 IMPLANT
ELECTRODE REM PT RTRN 9FT ADLT (ELECTROSURGICAL) ×1 IMPLANT
EVACUATOR 1/8 PVC DRAIN (DRAIN) ×1 IMPLANT
GAUZE 4X4 16PLY ~~LOC~~+RFID DBL (SPONGE) ×1 IMPLANT
GLOVE BIO SURGEON STRL SZ 6 (GLOVE) ×1 IMPLANT
GLOVE BIO SURGEON STRL SZ8 (GLOVE) ×2 IMPLANT
GLOVE BIO SURGEON STRL SZ8.5 (GLOVE) ×2 IMPLANT
GLOVE BIOGEL PI IND STRL 6.5 (GLOVE) ×1 IMPLANT
GLOVE BIOGEL PI IND STRL 7.5 (GLOVE) IMPLANT
GLOVE EXAM NITRILE XL STR (GLOVE) IMPLANT
GLOVE SURG SS PI 7.0 STRL IVOR (GLOVE) IMPLANT
GOWN STRL REUS W/ TWL LRG LVL3 (GOWN DISPOSABLE) ×1 IMPLANT
GOWN STRL REUS W/ TWL XL LVL3 (GOWN DISPOSABLE) ×2 IMPLANT
GOWN STRL REUS W/TWL 2XL LVL3 (GOWN DISPOSABLE) IMPLANT
HEMOSTAT POWDER KIT SURGIFOAM (HEMOSTASIS) ×1 IMPLANT
KIT BASIN OR (CUSTOM PROCEDURE TRAY) ×1 IMPLANT
KIT GRAFTMAG DEL NEURO DISP (NEUROSURGERY SUPPLIES) IMPLANT
KIT POSITIONER JACKSON TABLE (MISCELLANEOUS) ×1 IMPLANT
KIT TURNOVER KIT B (KITS) ×1 IMPLANT
NDL HYPO 21X1.5 SAFETY (NEEDLE) ×1 IMPLANT
NDL HYPO 22X1.5 SAFETY MO (MISCELLANEOUS) ×1 IMPLANT
NEEDLE HYPO 21X1.5 SAFETY (NEEDLE) ×1 IMPLANT
NEEDLE HYPO 22X1.5 SAFETY MO (MISCELLANEOUS) ×1 IMPLANT
NS IRRIG 1000ML POUR BTL (IV SOLUTION) ×1 IMPLANT
PACK LAMINECTOMY NEURO (CUSTOM PROCEDURE TRAY) ×1 IMPLANT
PAD ARMBOARD POSITIONER FOAM (MISCELLANEOUS) ×3 IMPLANT
PATTIES SURGICAL .5 X1 (DISPOSABLE) IMPLANT
PUTTY DBM 10CC CALC GRAN (Putty) IMPLANT
ROD CREO DLX CVD 6.35X40 (Rod) IMPLANT
SCREW PA DLX CREO 7.5X45 (Screw) IMPLANT
SCREW PA DLX CREO 7.5X50 (Screw) IMPLANT
SPACER ALTERA 10X31 8-12MM-8 (Spacer) IMPLANT
SPIKE FLUID TRANSFER (MISCELLANEOUS) ×1 IMPLANT
SPONGE NEURO XRAY DETECT 1X3 (DISPOSABLE) IMPLANT
SPONGE SURGIFOAM ABS GEL 100 (HEMOSTASIS) IMPLANT
SPONGE T-LAP 4X18 ~~LOC~~+RFID (SPONGE) IMPLANT
STRIP CLOSURE SKIN 1/2X4 (GAUZE/BANDAGES/DRESSINGS) ×1 IMPLANT
SUT VIC AB 1 CT1 18XBRD ANBCTR (SUTURE) ×2 IMPLANT
SUT VIC AB 2-0 CP2 18 (SUTURE) ×2 IMPLANT
SYR 20ML LL LF (SYRINGE) IMPLANT
TOWEL GREEN STERILE (TOWEL DISPOSABLE) ×1 IMPLANT
TOWEL GREEN STERILE FF (TOWEL DISPOSABLE) ×1 IMPLANT
TRAY FOLEY MTR SLVR 14FR STAT (SET/KITS/TRAYS/PACK) IMPLANT
TRAY FOLEY MTR SLVR 16FR STAT (SET/KITS/TRAYS/PACK) ×1 IMPLANT
WATER STERILE IRR 1000ML POUR (IV SOLUTION) ×1 IMPLANT

## 2023-12-27 NOTE — Anesthesia Procedure Notes (Signed)
 Procedure Name: Intubation Date/Time: 12/27/2023 7:45 AM  Performed by: Scherrie Mast, CRNAPre-anesthesia Checklist: Patient identified, Emergency Drugs available, Suction available and Patient being monitored Patient Re-evaluated:Patient Re-evaluated prior to induction Oxygen Delivery Method: Circle System Utilized Preoxygenation: Pre-oxygenation with 100% oxygen Induction Type: IV induction Ventilation: Mask ventilation without difficulty Laryngoscope Size: Mac and 3 Grade View: Grade I Tube type: Oral Tube size: 7.0 mm Number of attempts: 1 Airway Equipment and Method: Stylet and Oral airway Placement Confirmation: ETT inserted through vocal cords under direct vision, positive ETCO2 and breath sounds checked- equal and bilateral Tube secured with: Tape Dental Injury: Teeth and Oropharynx as per pre-operative assessment

## 2023-12-27 NOTE — Transfer of Care (Signed)
 Immediate Anesthesia Transfer of Care Note  Patient: [name]  Procedure(s) Performed: POSTERIOR LATERAL INTERBODY FUSION LUMBAR FOUR-LUMBAR FIVE INTERBODY PROTHESIS,POSTERIOR INSTRUMENTATION  Patient Location: PACU  Anesthesia Type:General  Level of Consciousness: awake, alert , and oriented  Airway & Oxygen Therapy: Patient Spontanous Breathing and Patient connected to nasal cannula oxygen  Post-op Assessment: Report given to RN and Post -op Vital signs reviewed and stable  Post vital signs: Reviewed and stable  Last Vitals:  Vitals Value Taken Time  BP 114/66 12/27/23 11:25  Temp 36.4 C 12/27/23 11:25  Pulse 66 12/27/23 11:29  Resp 14 12/27/23 11:29  SpO2 95 % 12/27/23 11:29  Vitals shown include unfiled device data.  Last Pain:  Vitals:   12/27/23 0626  TempSrc:   PainSc: 0-No pain         Complications: No notable events documented.

## 2023-12-27 NOTE — H&P (Signed)
 Subjective: The patient is a 68 year old black female who has complained of back and right greater left leg pain consistent with neurogenic claudication/lumbar radiculopathy.  She has failed medical management and was worked up with lumbar x-rays and lumbar MRI which demonstrated an L4-5 spondylolisthesis with spinal stenosis.  I discussed the various treatment options with her.  She has decided proceed with surgery.  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, DCIS   Depression    Difficulty sleeping    takes Lorazepam  as needed   HTN (hypertension)    Hypercholesteremia    IBS (irritable bowel syndrome)    Nausea with vomiting 07/23/2014   Radiation 11/23/14-01/07/15   Right breast    Past Surgical History:  Procedure Laterality Date   BREAST SURGERY  11/15   lumpectomy RT breast   COLONOSCOPY     CYST EXCISION     right axilla   DILATION AND CURETTAGE OF UTERUS     PORT-A-CATH REMOVAL N/A 03/12/2015   Procedure: REMOVAL PORT-A-CATH;  Surgeon: Krystal Russell, MD;  Location: [facility];  Service: General;  Laterality: N/A;   PORTACATH PLACEMENT Right 06/01/2014   Procedure: ULTRASOUND GUIDED INSERTION PORT-A-CATH;  Surgeon: Krystal Russell, MD;  Location: [facility];  Service: General;  Laterality: Right;   RADIOACTIVE SEED GUIDED PARTIAL MASTECTOMY WITH AXILLARY SENTINEL LYMPH NODE BIOPSY Right 04/20/2014   Procedure: RIGHT BREAST RADIOACTIVE SEED GUIDED LUMPECTOMY WITH RIGHT AXILLARY SENTINEL LYMPH NODE BIOPSY;  Surgeon: Krystal Russell, MD;  Location: [facility];  Service: General;  Laterality: Right;   TONSILLECTOMY      Allergies  Allergen Reactions   Shellfish Allergy Itching, Nausea And Vomiting, Swelling and Other (See Comments)    Lips swell   Ivp Dye [Iodinated Contrast Media] Itching, Rash and Other (See Comments)    Bruises, also   Penicillin G  Nausea And Vomiting and Rash   Penicillins Nausea And Vomiting and Rash    Has patient had a PCN reaction causing immediate rash, facial/tongue/throat swelling, SOB or lightheadedness with hypotension: Yes Has patient had a PCN reaction causing severe rash involving mucus membranes or skin necrosis: Yes Has patient had a PCN reaction that required hospitalization: Unknown Has patient had a PCN reaction occurring within the last 10 years: No If all of the above answers are NO, then may proceed with Cephalosporins Pt's mother told her this a long time ago    Social History   Tobacco Use   Smoking status: Never   Smokeless tobacco: Never  Substance Use Topics   Alcohol use: Yes    Alcohol/week: 0.0 standard drinks of alcohol    Comment: social    Family History  Problem Relation Age of Onset   Heart attack Father    CAD Mother        MI in her 30's   Hypertension Mother    Diabetes Mother    CAD Maternal Grandmother        died of MI and CVA at age 48   Stroke Maternal Grandmother    Colon cancer Neg Hx    Rectal cancer Neg Hx    Stomach cancer Neg Hx    Esophageal cancer Neg Hx    Liver cancer Neg Hx    Prior to Admission medications   Medication Sig Start Date End Date Taking? Authorizing Provider  acetaminophen  (  TYLENOL ) 325 MG tablet Take 650 mg by mouth every 6 (six) hours as needed for mild pain (pain score 1-3) or moderate pain (pain score 4-6).   Yes [provider]  amLODipine  (NORVASC ) 10 MG tablet Take 10 mg by mouth in the morning.   Yes [provider]  atorvastatin  (LIPITOR) 20 MG tablet Take 20 mg by mouth in the morning.   Yes [provider]  Biotin 2500 MCG CAPS Take 2,500 mcg by mouth daily.   Yes [provider]  Cholecalciferol (VITAMIN D3) 25 MCG (1000 UT) CAPS Take 1,000 Units by mouth daily.   Yes [provider]  Cranberry 125 MG TABS Take 1 tablet by mouth daily. Patient taking differently: Take 125 mg  by mouth daily. 06/09/21  Yes Gudena, Vinay, MD  diphenhydrAMINE  (BENADRYL ) 25 mg capsule Take 25 mg by mouth every 6 (six) hours as needed for allergies.   Yes [provider]  exemestane  (AROMASIN ) 25 MG tablet TAKE ONE TABLET BY MOUTH ONCE DAILY 09/03/23  Yes Gudena, Vinay, MD  fluticasone  (FLONASE ) 50 MCG/ACT nasal spray Place 1 spray into both nostrils daily. Patient taking differently: Place 1 spray into both nostrils daily as needed for allergies or rhinitis. 06/09/21  Yes Gudena, Vinay, MD  irbesartan  (AVAPRO ) 300 MG tablet Take 300 mg by mouth in the morning. 04/26/14  Yes [provider]  metoprolol  succinate (TOPROL -XL) 50 MG 24 hr tablet Take 50 mg by mouth daily. Take with or immediately following a meal.    Yes [provider]  naproxen sodium (ALEVE) 220 MG tablet Take 440 mg by mouth daily as needed (pain/headache).   Yes [provider]  sertraline  (ZOLOFT ) 50 MG tablet Take 1 tablet (50 mg total) by mouth daily. Patient taking differently: Take 50 mg by mouth 3 (three) times a week. 05/15/17  Yes Gudena, Vinay, MD  SYSTANE ULTRA PF 0.4-0.3 % SOLN Place 1 drop into both eyes 4 (four) times daily as needed (for dryness).   Yes [provider]  traMADol  (ULTRAM ) 50 MG tablet Take 50 mg by mouth every 6 (six) hours as needed for moderate pain (pain score 4-6) or severe pain (pain score 7-10). Patient not taking: Reported on 12/19/2023   Yes [provider]     Review of Systems  Positive ROS: As above  All other systems have been reviewed and were otherwise negative with the exception of those mentioned in the HPI and as above.  Objective: Vital signs in last 24 hours: Temp:  [98.3 F (36.8 C)] 98.3 F (36.8 C) (07/31 0602) Pulse Rate:  [59] 59 (07/31 0602) Resp:  [18] 18 (07/31 0602) BP: (146)/(76) 146/76 (07/31 0602) SpO2:  [96 %] 96 % (07/31 0602) Weight:  [59.9 kg] 59.9 kg (07/31 0602) Estimated body mass index is 24.94  kg/m as calculated from the following:   Height as of this encounter: 5' 1 (1.549 m).   Weight as of this encounter: 59.9 kg.   General Appearance: Alert Head: Normocephalic, without obvious abnormality, atraumatic Eyes: PERRL, conjunctiva/corneas clear, EOM's intact,    Ears: Normal  Throat: Normal  Neck: Supple, Back: unremarkable Lungs: Clear to auscultation bilaterally, respirations unlabored Heart: Regular rate and rhythm, no murmur, rub or gallop Abdomen: Soft, non-tender Extremities: Extremities normal, atraumatic, no cyanosis or edema Skin: unremarkable  NEUROLOGIC:   Mental status: alert and oriented,Motor Exam - grossly normal Sensory Exam - grossly normal Reflexes:  Coordination - grossly normal Gait - grossly  normal Balance - grossly normal Cranial Nerves: I: smell Not tested  II: visual acuity  OS: Normal  OD: Normal   II: visual fields Full to confrontation  II: pupils Equal, round, reactive to light  III,VII: ptosis None  III,IV,VI: extraocular muscles  Full ROM  V: mastication Normal  V: facial light touch sensation  Normal  V,VII: corneal reflex  Present  VII: facial muscle function - upper  Normal  VII: facial muscle function - lower Normal  VIII: hearing Not tested  IX: soft palate elevation  Normal  IX,X: gag reflex Present  XI: trapezius strength  5/5  XI: sternocleidomastoid strength 5/5  XI: neck flexion strength  5/5  XII: tongue strength  Normal    Data Review Lab Results  Component Value Date   WBC 4.4 12/19/2023   HGB 14.8 12/19/2023   HCT 44.8 12/19/2023   MCV 89.4 12/19/2023   PLT 343 12/19/2023   Lab Results  Component Value Date   NA 137 12/19/2023   K 3.5 12/19/2023   CL 100 12/19/2023   CO2 27 12/19/2023   BUN 8 12/19/2023   CREATININE 0.68 12/19/2023   GLUCOSE 97 12/19/2023   Lab Results  Component Value Date   INR 0.92 03/11/2015    Assessment/Plan: Lumbar spondylolisthesis, lumbar facet arthropathy, lumbar  spinal stenosis, neurogenic claudication, lumbar radiculopathy, lumbago: I have discussed the situation with the patient.  I reviewed her imaging studies with her.  I pointed out the abnormalities.  We have discussed the various treatment options including surgery.  I have described the surgical treatment option of an L4-5 decompression, instrumentation and fusion.  I have shown her surgical models.  I have given her surgical pamphlet.  We have discussed the risk, benefits, alternatives, expected postop course, and likelihood of achieving her goals with surgery.  I have answered all her questions.  She has decided proceed with surgery.   Mckenzie Sanford Budge 12/27/2023 7:27 AM

## 2023-12-27 NOTE — Op Note (Signed)
 Brief history: The patient is a 68 year old black female who is complaining of back and right greater left leg pain consistent with lumbar radiculopathy/neurogenic claudication.  She has failed medical management and was worked up with lumbar x-rays and a lumbar MRI which demonstrated a degenerative scoliosis, lumbar spine listhesis, lumbar spinal stenosis, etc.  I discussed the various treatment options with her.  She has decided proceed with surgery.  Preoperative diagnosis: Lumbar spondylolisthesis, facet arthropathy, degenerative disc disease, spinal stenosis compressing both the L4 and the L5 nerve roots; lumbago; lumbar radiculopathy; neurogenic claudication  Postoperative diagnosis: The same  Procedure: Bilateral L4-5 laminotomy/foraminotomies/medial facetectomy to decompress the bilateral L4 and L5 nerve roots(the work required to do this was in addition to the work required to do the posterior lumbar interbody fusion because of the patient's spinal stenosis, facet arthropathy. Etc. requiring a wide decompression of the nerve roots.); right L4-5 transforaminal lumbar interbody fusion with local morselized autograft bone and Zimmer DBM; insertion of interbody prosthesis at L4-5 (globus peek expandable interbody prosthesis); posterior nonsegmental instrumentation from L4 to L5 with globus titanium pedicle screws and rods; posterior lateral arthrodesis at L4-5 with local morselized autograft bone and Zimmer DBM.  Surgeon: Dr. Chyrl Budge  Asst.: Duwaine Beck, NP  Anesthesia: Gen. endotracheal  Estimated blood loss: 200 cc  Drains: Medium Hemovac drain in the epidural space  Complications: None  Description of procedure: The patient was brought to the operating room by the anesthesia team. General endotracheal anesthesia was induced. The patient was turned to the prone position on the Wilson frame. The patient's lumbosacral region was then prepared with Betadine scrub and Betadine solution.  Sterile drapes were applied.  I then injected the area to be incised with Marcaine  with epinephrine  solution. I then used the scalpel to make a linear midline incision over the L4-5 interspace. I then used electrocautery to perform a bilateral subperiosteal dissection exposing the spinous process and lamina of L4-5 bilaterally. We then obtained intraoperative radiograph to confirm our location. We then inserted the Verstrac retractor to provide exposure.  I began the decompression by using the high speed drill to perform laminotomies at L4-5 bilaterally. We then used the Kerrison punches to widen the laminotomy and removed the ligamentum flavum at L4-5 bilaterally. We used the Kerrison punches to remove the medial facets at L4-5 bilaterally, we removed the right L4-5 facet. We performed wide foraminotomies about the bilateral L4 and L5 nerve roots completing the decompression.  We now turned our attention to the posterior lumbar interbody fusion. I used a scalpel to incise the intervertebral disc at L4-5 bilaterally. I then performed a partial intervertebral discectomy at L4-5 bilaterally using the pituitary forceps. We prepared the vertebral endplates at L4-5 bilaterally for the fusion by removing the soft tissues with the curettes. We then used the trial spacers to pick the appropriate sized interbody prosthesis. We prefilled his prosthesis with a combination of local morselized autograft bone that we obtained during the decompression as well as Zimmer DBM. We inserted the prefilled prosthesis into the interspace at L4-5 from the right, we then turned and expanded the prosthesis. There was a good snug fit of the prosthesis in the interspace. We then filled and the remainder of the intervertebral disc space with local morselized autograft bone and Zimmer DBM. This completed the posterior lumbar interbody arthrodesis.  During the decompression and insertion of the prosthesis the assistant protected the thecal  sac and nerve roots with the D'Errico retractor.  We now turned  attention to the instrumentation. Under fluoroscopic guidance we cannulated the bilateral L4 and L5 pedicles with the bone probe. We then removed the bone probe. We then tapped the pedicle with a 6.5 millimeter tap. We then removed the tap. We probed inside the tapped pedicle with a ball probe to rule out cortical breaches. We then inserted a 7.5 x 45 and 50 millimeter pedicle screw into the L4 and L5 pedicles bilaterally under fluoroscopic guidance. We then palpated along the medial aspect of the pedicles to rule out cortical breaches. There were none. The nerve roots were not injured. We then connected the unilateral pedicle screws with a lordotic rod. We compressed the construct and secured the rod in place with the caps. We then tightened the caps appropriately. This completed the instrumentation from L4-5 bilaterally.  We now turned our attention to the posterior lateral arthrodesis at L4-5. We used the high-speed drill to decorticate the remainder of the facets, pars, transverse process at L4-5. We then applied a combination of local morselized autograft bone and Zimmer DBM over these decorticated posterior lateral structures. This completed the posterior lateral arthrodesis.  We then obtained hemostasis using bipolar electrocautery. We irrigated the wound out with vashe solution. We inspected the thecal sac and nerve roots and noted they were well decompressed. We then removed the retractor.  We injected Exparel  .  We placed a medium Hemovac drain in the epidural space and tunneled it out through a separate stab wound.  We reapproximated patient's thoracolumbar fascia with interrupted #1 Vicryl suture. We reapproximated patient's subcutaneous tissue with interrupted 2-0 Vicryl suture. The reapproximated patient's skin with Steri-Strips and benzoin. The wound was then coated with bacitracin  ointment. A sterile dressing was applied. The  drapes were removed. The patient was subsequently returned to the supine position where they were extubated by the anesthesia team. He was then transported to the post anesthesia care unit in stable condition. All sponge instrument and needle counts were reportedly correct at the end of this case.

## 2023-12-27 NOTE — Anesthesia Postprocedure Evaluation (Signed)
 Anesthesia Post Note  Patient: Mckenzie Sanford  Procedure(s) Performed: POSTERIOR LATERAL INTERBODY FUSION LUMBAR FOUR-LUMBAR FIVE INTERBODY PROTHESIS,POSTERIOR INSTRUMENTATION     Patient location during evaluation: PACU Anesthesia Type: General Level of consciousness: awake and alert Pain management: pain level controlled Vital Signs Assessment: post-procedure vital signs reviewed and stable Respiratory status: spontaneous breathing, nonlabored ventilation and respiratory function stable Cardiovascular status: blood pressure returned to baseline and stable Postop Assessment: no apparent nausea or vomiting Anesthetic complications: no   There were no known notable events for this encounter.  Last Vitals:  Vitals:   12/27/23 1330 12/27/23 1345  BP: (!) 141/63 128/74  Pulse: 62 67  Resp: 15 20  Temp:  37.1 C  SpO2: 97% 95%    Last Pain:  Vitals:   12/27/23 1325  TempSrc:   PainSc: 7                  Garnette FORBES Skillern

## 2023-12-27 NOTE — Anesthesia Preprocedure Evaluation (Addendum)
 Anesthesia Evaluation  Patient identified by MRN, date of birth, ID band Patient awake    Reviewed: Allergy & Precautions, NPO status , Patient's Chart, lab work & pertinent test results, reviewed documented beta blocker date and time   History of Anesthesia Complications Negative for: history of anesthetic complications  Airway Mallampati: II  TM Distance: >3 FB Neck ROM: Full    Dental  (+) Teeth Intact, Dental Advisory Given   Pulmonary neg pulmonary ROS   Pulmonary exam normal breath sounds clear to auscultation       Cardiovascular hypertension, Pt. on medications and Pt. on home beta blockers (-) angina (-) Past MI Normal cardiovascular exam Rhythm:Regular Rate:Normal     Neuro/Psych  PSYCHIATRIC DISORDERS Anxiety Depression    Spondylolisthesis, lumbar region    GI/Hepatic Neg liver ROS,GERD  ,,  Endo/Other  negative endocrine ROS    Renal/GU negative Renal ROS     Musculoskeletal  (+) Arthritis ,    Abdominal   Peds  Hematology negative hematology ROS (+)   Anesthesia Other Findings Day of surgery medications reviewed with the patient.  H/o breast cancer, right  Reproductive/Obstetrics                              Anesthesia Physical Anesthesia Plan  ASA: 2  Anesthesia Plan: General   Post-op Pain Management: Ofirmev  IV (intra-op)* and Ketamine  IV*   Induction: Intravenous  PONV Risk Score and Plan: 3 and Midazolam , Dexamethasone  and Ondansetron   Airway Management Planned: Oral ETT  Additional Equipment:   Intra-op Plan:   Post-operative Plan: Extubation in OR  Informed Consent: I have reviewed the patients History and Physical, chart, labs and discussed the procedure including the risks, benefits and alternatives for the proposed anesthesia with the patient or authorized representative who has indicated his/her understanding and acceptance.     Dental advisory  given  Plan Discussed with: CRNA  Anesthesia Plan Comments:          Anesthesia Quick Evaluation

## 2023-12-28 DIAGNOSIS — Z79899 Other long term (current) drug therapy: Secondary | ICD-10-CM | POA: Diagnosis not present

## 2023-12-28 DIAGNOSIS — F419 Anxiety disorder, unspecified: Secondary | ICD-10-CM | POA: Diagnosis not present

## 2023-12-28 DIAGNOSIS — M5116 Intervertebral disc disorders with radiculopathy, lumbar region: Secondary | ICD-10-CM | POA: Diagnosis not present

## 2023-12-28 DIAGNOSIS — M4316 Spondylolisthesis, lumbar region: Secondary | ICD-10-CM | POA: Diagnosis not present

## 2023-12-28 DIAGNOSIS — M4156 Other secondary scoliosis, lumbar region: Secondary | ICD-10-CM | POA: Diagnosis not present

## 2023-12-28 DIAGNOSIS — M48062 Spinal stenosis, lumbar region with neurogenic claudication: Secondary | ICD-10-CM | POA: Diagnosis not present

## 2023-12-28 DIAGNOSIS — F32A Depression, unspecified: Secondary | ICD-10-CM | POA: Diagnosis not present

## 2023-12-28 DIAGNOSIS — M4726 Other spondylosis with radiculopathy, lumbar region: Secondary | ICD-10-CM | POA: Diagnosis not present

## 2023-12-28 DIAGNOSIS — I10 Essential (primary) hypertension: Secondary | ICD-10-CM | POA: Diagnosis not present

## 2023-12-28 MED ORDER — OXYCODONE-ACETAMINOPHEN 5-325 MG PO TABS: 1.0000 | ORAL_TABLET | ORAL | 0 refills | Status: AC | PRN

## 2023-12-28 MED ORDER — DOCUSATE SODIUM 100 MG PO CAPS: 100.0000 mg | ORAL_CAPSULE | Freq: Two times a day (BID) | ORAL | 0 refills | Status: AC

## 2023-12-28 MED ORDER — OXYCODONE-ACETAMINOPHEN 5-325 MG PO TABS
1.0000 | ORAL_TABLET | ORAL | Status: DC | PRN
  Administered 2023-12-28: 2 via ORAL
  Filled 2023-12-28: qty 2

## 2023-12-28 MED ORDER — CYCLOBENZAPRINE HCL 5 MG PO TABS: 5.0000 mg | ORAL_TABLET | Freq: Three times a day (TID) | ORAL | Status: DC | PRN

## 2023-12-28 MED ORDER — CYCLOBENZAPRINE HCL 5 MG PO TABS: 5.0000 mg | ORAL_TABLET | Freq: Three times a day (TID) | ORAL | 0 refills | Status: AC | PRN

## 2023-12-28 NOTE — Discharge Summary (Signed)
 Physician Discharge Summary  Patient ID: Mckenzie Sanford MRN: [id number] DOB/AGE: 66/05/1955 65 y.o.  Admit date: 12/27/2023 Discharge date: 12/28/2023  Admission Diagnoses: Lumbar spondylolisthesis, lumbar facet arthropathy, lumbar spinal stenosis, lumbar radiculopathy, neurogenic claudication, lumbago  Discharge Diagnoses: The same Principal Problem:   Spondylolisthesis of lumbar region   Discharged Condition: good  Hospital Course: I performed an L4-5 decompression, instrumentation and fusion on the patient on 12/27/2023.  The surgery went well.  The patient's postoperative course was unremarkable.  On postoperative day #1 the patient looked and felt well and requested discharge to home.  She was given verbal and written discharge instructions.  All her questions were answered.  Consults: PT, care management Significant Diagnostic Studies: None Treatments: L4-5 decompression, instrumentation and fusion. Discharge Exam: Blood pressure 133/79, pulse 65, temperature 98.3 F (36.8 C), temperature source Oral, resp. rate 18, height 5' 1 (1.549 m), weight 59.9 kg, last menstrual period 09/26/2011, SpO2 95%. The patient is alert and pleasant.  She looks well.  Her strength is normal.  Disposition: Home  Discharge Instructions     Call MD for:  difficulty breathing, headache or visual disturbances   Complete by: As directed    Call MD for:  extreme fatigue   Complete by: As directed    Call MD for:  hives   Complete by: As directed    Call MD for:  persistant dizziness or light-headedness   Complete by: As directed    Call MD for:  persistant nausea and vomiting   Complete by: As directed    Call MD for:  redness, tenderness, or signs of infection (pain, swelling, redness, odor or green/yellow discharge around incision site)   Complete by: As directed    Call MD for:  severe uncontrolled pain   Complete by: As directed    Call MD for:  temperature >100.4   Complete by: As  directed    Diet - low sodium heart healthy   Complete by: As directed    Discharge instructions   Complete by: As directed    Call [phone] for a followup appointment. Take a stool softener while you are using pain medications.   Driving Restrictions   Complete by: As directed    Do not drive for 2 weeks.   Increase activity slowly   Complete by: As directed    Lifting restrictions   Complete by: As directed    Do not lift more than 5 pounds. No excessive bending or twisting.   May shower / Bathe   Complete by: As directed    Remove the dressing for 3 days after surgery.  You may shower, but leave the incision alone.   Remove dressing in 48 hours   Complete by: As directed       Allergies as of 12/28/2023       Reactions   Shellfish Allergy Itching, Nausea And Vomiting, Swelling, Other (See Comments)   Lips swell   Ivp Dye [iodinated Contrast Media] Itching, Rash, Other (See Comments)   Bruises, also   Penicillin G Nausea And Vomiting, Rash   Penicillins Nausea And Vomiting, Rash   Has patient had a PCN reaction causing immediate rash, facial/tongue/throat swelling, SOB or lightheadedness with hypotension: Yes Has patient had a PCN reaction causing severe rash involving mucus membranes or skin necrosis: Yes Has patient had a PCN reaction that required hospitalization: Unknown Has patient had a PCN reaction occurring within the last 10 years: No If all of the above  answers are NO, then may proceed with Cephalosporins Pt's mother told her this a long time ago        Medication List     STOP taking these medications    acetaminophen  325 MG tablet Commonly known as: TYLENOL    naproxen sodium 220 MG tablet Commonly known as: ALEVE   traMADol  50 MG tablet Commonly known as: ULTRAM        TAKE these medications    amLODipine  10 MG tablet Commonly known as: NORVASC  Take 10 mg by mouth in the morning.   atorvastatin  20 MG tablet Commonly known as:  LIPITOR Take 20 mg by mouth in the morning.   Biotin 2500 MCG Caps Take 2,500 mcg by mouth daily.   Cranberry 125 MG Tabs Take 1 tablet by mouth daily. What changed: how much to take   cyclobenzaprine  5 MG tablet Commonly known as: FLEXERIL  Take 1 tablet (5 mg total) by mouth 3 (three) times daily as needed for muscle spasms.   diphenhydrAMINE  25 mg capsule Commonly known as: BENADRYL  Take 25 mg by mouth every 6 (six) hours as needed for allergies.   docusate sodium  100 MG capsule Commonly known as: COLACE Take 1 capsule (100 mg total) by mouth 2 (two) times daily.   exemestane  25 MG tablet Commonly known as: AROMASIN  TAKE ONE TABLET BY MOUTH ONCE DAILY   fluticasone  50 MCG/ACT nasal spray Commonly known as: FLONASE  Place 1 spray into both nostrils daily. What changed:  when to take this reasons to take this   irbesartan  300 MG tablet Commonly known as: AVAPRO  Take 300 mg by mouth in the morning.   metoprolol  succinate 50 MG 24 hr tablet Commonly known as: TOPROL -XL Take 50 mg by mouth daily. Take with or immediately following a meal.   oxyCODONE -acetaminophen  5-325 MG tablet Commonly known as: PERCOCET/ROXICET Take 1-2 tablets by mouth every 4 (four) hours as needed for moderate pain (pain score 4-6).   sertraline  50 MG tablet Commonly known as: ZOLOFT  Take 1 tablet (50 mg total) by mouth daily. What changed: when to take this   Systane Ultra PF 0.4-0.3 % Soln Generic drug: Polyethyl Glyc-Propyl Glyc PF Place 1 drop into both eyes 4 (four) times daily as needed (for dryness).   Vitamin D3 25 MCG (1000 UT) Caps Take 1,000 Units by mouth daily.         Signed: Reyes JONETTA Budge 12/28/2023, 6:52 AM

## 2023-12-28 NOTE — Evaluation (Signed)
 Physical Therapy Brief Evaluation and Discharge Note  Patient Details Name: Mckenzie Sanford MRN: [id number] DOB: 1956/01/20 Today's Date: 12/28/2023   History of Present Illness  Pt is a 68 y/o female who presents s/p L4-L5 PLIF on 12/27/2023. PMH significant for Breast CA s/p radiation and lumpectomy, HTN.   Clinical Impression  Patient evaluated by Physical Therapy with no further acute PT needs identified. All education has been completed and the patient has no further questions. Pt was able to demonstrate transfers and ambulation with gross modified independence and RW for support. Pt was educated on precautions, brace application/wearing schedule, appropriate activity progression, and car transfer. See below for any follow-up Physical Therapy or equipment needs. PT is signing off. Thank you for this referral.        PT Assessment Patient does not need any further PT services  Assistance Needed at Discharge  PRN    Equipment Recommendations Rolling walker (2 wheels);BSC/3in1  Recommendations for Other Services       Precautions/Restrictions Precautions Precautions: Fall;Back Precaution Booklet Issued: Yes (comment) Recall of Precautions/Restrictions: Intact Precaution/Restrictions Comments: Reviewed handout and pt was cued for precautions during functional mobility Required Braces or Orthoses: Spinal Brace Spinal Brace: Lumbar corset;Applied in sitting position Restrictions Weight Bearing Restrictions Per Provider Order: No        Mobility  Bed Mobility Rolling: Modified independent (Device/Increase time) Supine/Sidelying to sit: Modified independent (Device/Increased time) Sit to supine/sidelying: Modified independent (Device/Increased time) General bed mobility comments: HOB flat and rails lowered to simulate home environment.  Transfers Overall transfer level: Modified independent Equipment used: Rolling walker (2 wheels)               General transfer  comment: VC's for hand placement on seated surface for safety.    Ambulation/Gait Ambulation/Gait assistance: Modified independent (Device/Increase time) Gait Distance (Feet): 560 Feet Assistive device: Rolling walker (2 wheels), None Gait Pattern/deviations: Step-through pattern, Decreased stride length, Trunk flexed Gait Speed: Below normal General Gait Details: Close guard for safety without AD, but mod I with RW.  Home Activity Instructions Home Activity Instructions: Reviewed positioning recommendations and appropriate activity tolerance.  Stairs            Modified Rankin (Stroke Patients Only)        Balance Overall balance assessment: Mild deficits observed, not formally tested Sitting-balance support: Feet supported, No upper extremity supported Sitting balance-Leahy Scale: Fair     Standing balance support: No upper extremity supported, During functional activity Standing balance-Leahy Scale: Fair            Pertinent Vitals/Pain PT - Brief Vital Signs All Vital Signs Stable: Yes Pain Assessment Pain Assessment: Faces Pain Location: Back/incision site Pain Descriptors / Indicators: Operative site guarding, Sore Pain Intervention(s): Limited activity within patient's tolerance, Monitored during session, Repositioned     Home Living Family/patient expects to be discharged to:: Private residence Living Arrangements: Other relatives (Sister) Available Help at Discharge: Family;Available 24 hours/day Home Environment: Stairs to enter  [employer] of Steps: 1 Home Equipment: None        Prior Function Level of Independence: Independent      UE/LE Assessment   UE ROM/Strength/Tone/Coordination: WFL    LE ROM/Strength/Tone/Coordination: Generalized weakness (Mild; consistent with pre-op diagnosis)      Communication   Communication Communication: No apparent difficulties     Cognition Overall Cognitive Status: Appears within functional  limits for tasks assessed/performed       General Comments  Exercises     Assessment/Plan    PT Problem List         PT Visit Diagnosis Unsteadiness on feet (R26.81);Pain    No Skilled PT All education completed;Patient is modified independent with all activity/mobility   Co-evaluation                AMPAC 6 Clicks Help needed turning from your back to your side while in a flat bed without using bedrails?: None Help needed moving from lying on your back to sitting on the side of a flat bed without using bedrails?: None Help needed moving to and from a bed to a chair (including a wheelchair)?: None Help needed standing up from a chair using your arms (e.g., wheelchair or bedside chair)?: None Help needed to walk in hospital room?: None Help needed climbing 3-5 steps with a railing? : A Little 6 Click Score: 23      End of Session Equipment Utilized During Treatment: Gait belt;Back brace Activity Tolerance: Patient tolerated treatment well Patient left: in bed;with call bell/phone within reach Nurse Communication: Mobility status PT Visit Diagnosis: Unsteadiness on feet (R26.81);Pain Pain - part of body:  (back)     Time: 9142-9068 PT Time Calculation (min) (ACUTE ONLY): 34 min  Charges:   PT Evaluation $PT Eval Low Complexity: 1 Low PT Treatments $Gait Training: 8-22 mins    Mckenzie Sanford, PT, DPT Acute Rehabilitation Services Secure Chat Preferred Office: [phone]   Mckenzie Sanford  12/28/2023, 10:21 AM

## 2023-12-28 NOTE — Progress Notes (Signed)
 Patient alert and oriented, void, ambulate. Surgical site clean and dry no sign of infection. D/c instructions explain and given to the patient. Patient d/c home with RW, 3 in 1 per order.

## 2024-01-01 MED FILL — Heparin Sodium (Porcine) Inj 1000 Unit/ML: INTRAMUSCULAR | Qty: 30 | Status: AC

## 2024-01-01 MED FILL — Sodium Chloride IV Soln 0.9%: INTRAVENOUS | Qty: 2000 | Status: AC

## 2024-01-22 DIAGNOSIS — M48062 Spinal stenosis, lumbar region with neurogenic claudication: Secondary | ICD-10-CM | POA: Diagnosis not present

## 2024-01-22 DIAGNOSIS — M4316 Spondylolisthesis, lumbar region: Secondary | ICD-10-CM | POA: Diagnosis not present

## 2024-01-30 DIAGNOSIS — Z1231 Encounter for screening mammogram for malignant neoplasm of breast: Secondary | ICD-10-CM | POA: Diagnosis not present

## 2024-02-19 NOTE — Progress Notes (Unsigned)
 Cardiology Office Note   Date:  02/21/2024   ID:  INITA URAM, DOB July 29, 1955, MRN [id number]  PCP:  Janey Santos, MD  Cardiologist:   Lynwood Schilling, MD Referring:  Self  Chief Complaint  Patient presents with   Loss of Consciousness      History of Present Illness: Mckenzie Sanford is a 68 y.o. female who presents for evaluation of syncope.  I saw her in 2020 for evaluation of sycnope.   She was in the hospital in late July 2019 for this.  She had a normal echocardiogram.  There were no arrhythmias.  She had no ischemia on a POET (Plain Old Exercise Treadmill) although she did have a hypotensive BP response.   I did not think that this was high risk. I have not seen her since 2020.     She wanted to come in just for a check up.  She just had lumbar disc surgery.  She is recovering from this.  She is walking with a walker.  She likes to do a lot of walking without walker.  She is not having any new cardiovascular complaints.  In particular she is not having any further syncope or presyncope.  She has had no chest pressure, neck or arm discomfort.  She has had no weight gain or edema.  She is slowly getting over the pain with the back surgery.  She is a retired Runner, broadcasting/film/video and enjoys being retired.   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, DCIS   Depression    Difficulty sleeping    takes Lorazepam  as needed   HTN (hypertension)    Hypercholesteremia    IBS (irritable bowel syndrome)    Nausea with vomiting 07/23/2014   Radiation 11/23/14-01/07/15   Right breast    Past Surgical History:  Procedure Laterality Date   BREAST SURGERY  11/15   lumpectomy RT breast   COLONOSCOPY     CYST EXCISION     right axilla   DILATION AND CURETTAGE OF UTERUS     PORT-A-CATH REMOVAL N/A 03/12/2015   Procedure: REMOVAL PORT-A-CATH;  Surgeon: Krystal Russell, MD;  Location: [facility];  Service: General;  Laterality: N/A;   PORTACATH PLACEMENT Right 06/01/2014   Procedure: ULTRASOUND GUIDED INSERTION PORT-A-CATH;  Surgeon: Krystal Russell, MD;  Location: [facility];  Service: General;  Laterality: Right;   RADIOACTIVE SEED GUIDED PARTIAL MASTECTOMY WITH AXILLARY SENTINEL LYMPH NODE BIOPSY Right 04/20/2014   Procedure: RIGHT BREAST RADIOACTIVE SEED GUIDED LUMPECTOMY WITH RIGHT AXILLARY SENTINEL LYMPH NODE BIOPSY;  Surgeon: Krystal Russell, MD;  Location: [facility];  Service: General;  Laterality: Right;   TONSILLECTOMY       Current Outpatient Medications  Medication Sig Dispense Refill   amLODipine  (NORVASC ) 10 MG tablet Take 10 mg by mouth in the morning.     atorvastatin  (LIPITOR) 20 MG tablet Take 20 mg by mouth in the morning.     Biotin 2500 MCG CAPS Take 2,500 mcg by mouth daily.     Cholecalciferol (VITAMIN D3) 25 MCG (1000 UT) CAPS Take 1,000 Units by mouth daily.     Cranberry 125 MG TABS Take 1 tablet by mouth daily. (Patient taking differently: Take 125 mg by mouth daily. Patient takes 2 taps)     cyclobenzaprine  (FLEXERIL ) 5 MG tablet Take 1 tablet (5  mg total) by mouth 3 (three) times daily as needed for muscle spasms. 30 tablet 0   diphenhydrAMINE  (BENADRYL ) 25 mg capsule Take 25 mg by mouth every 6 (six) hours as needed for allergies.     docusate sodium  (COLACE) 100 MG capsule Take 1 capsule (100 mg total) by mouth 2 (two) times daily. 30 capsule 0   exemestane  (AROMASIN ) 25 MG tablet TAKE ONE TABLET BY MOUTH ONCE DAILY 90 tablet 3   fluticasone  (FLONASE ) 50 MCG/ACT nasal spray Place 1 spray into both nostrils daily. (Patient taking differently: Place 1 spray into both nostrils daily as needed for allergies or rhinitis.)  2   HYDROcodone -acetaminophen  (NORCO/VICODIN) 5-325 MG tablet take 1-2 tablet by oral route  every 6 hours as needed for pain     irbesartan  (AVAPRO ) 300 MG tablet Take 300 mg by mouth in the morning.      metoprolol  succinate (TOPROL -XL) 50 MG 24 hr tablet Take 50 mg by mouth daily. Take with or immediately following a meal.      sertraline  (ZOLOFT ) 50 MG tablet Take 1 tablet (50 mg total) by mouth daily. (Patient taking differently: Take 50 mg by mouth 3 (three) times a week.)     SYSTANE ULTRA PF 0.4-0.3 % SOLN Place 1 drop into both eyes 4 (four) times daily as needed (for dryness).     oxyCODONE -acetaminophen  (PERCOCET/ROXICET) 5-325 MG tablet Take 1-2 tablets by mouth every 4 (four) hours as needed for moderate pain (pain score 4-6). (Patient not taking: Reported on 02/21/2024) 40 tablet 0   Current Facility-Administered Medications  Medication Dose Route Frequency Provider Last Rate Last Admin   ofloxacin  (FLOXIN ) 0.3 % OTIC (EAR) solution 5 drop  5 drop Both EARS BID PRN         Allergies:   Shellfish allergy, Ivp dye [iodinated contrast media], Penicillin g, and Penicillins    Social History:  The patient  reports that she has never smoked. She has never used smokeless tobacco. She reports current alcohol use. She reports that she does not use drugs.   Family History:  The patient's family history includes CAD in her maternal grandmother and mother; Diabetes in her mother; Heart attack in her father; Hypertension in her mother; Stroke in her maternal grandmother.    ROS:  Please see the history of present illness.   Otherwise, review of systems are positive for none.   All other systems are reviewed and negative.    PHYSICAL EXAM: VS:  BP 120/70   Pulse 91   Ht 5' 1 (1.549 m)   Wt 136 lb (61.7 kg)   LMP 09/26/2011   SpO2 97%   BMI 25.70 kg/m  , BMI Body mass index is 25.7 kg/m. GENERAL:  Well appearing HEENT:  Pupils equal round and reactive, fundi not visualized, oral mucosa unremarkable NECK:  No jugular venous distention, waveform within normal limits, carotid upstroke brisk and symmetric, no bruits, no thyromegaly LYMPHATICS:  No cervical, inguinal adenopathy LUNGS:   Clear to auscultation bilaterally BACK:  No CVA tenderness CHEST:  Unremarkable HEART:  PMI not displaced or sustained,S1 and S2 within normal limits, no S3, no S4, no clicks, no rubs, no murmurs ABD:  Flat, positive bowel sounds normal in frequency in pitch, no bruits, no rebound, no guarding, no midline pulsatile mass, no hepatomegaly, no splenomegaly EXT:  2 plus pulses throughout, no edema, no cyanosis no clubbing SKIN:  No rashes no nodules NEURO:  Cranial nerves II through XII grossly  intact, motor grossly intact throughout [facility]:  Cognitively intact, oriented to person place and time    EKG:    12/19/2023 sinus rhythm, rate 57, axis within normal limits, intervals within normal limits, no acute ST-T wave changes.    Recent Labs: 12/19/2023: BUN 8; Creatinine, Ser 0.68; Hemoglobin 14.8; Platelets 343; Potassium 3.5; Sodium 137    Lipid Panel No results found for: CHOL, TRIG, HDL, CHOLHDL, VLDL, LDLCALC, LDLDIRECT    Wt Readings from Last 3 Encounters:  02/21/24 136 lb (61.7 kg)  12/27/23 132 lb (59.9 kg)  12/19/23 133 lb 3.2 oz (60.4 kg)      Other studies Reviewed: Additional studies/ records that were reviewed today include: Labs. Review of the above records demonstrates:  Please see elsewhere in the note.     ASSESSMENT AND PLAN:  Syncope: The patient has had no further syncope or presyncope.  No further workup is suggested.  HTN: She is on 3 medications for her blood pressure and her blood pressure is well-controlled.  She talked about wanting to come off of some of these but I convinced her that this is great control with a good regimen and no med changes are suggested.  No change in therapy.  Current medicines are reviewed at length with the patient today.  The patient does not have concerns regarding medicines.  The following changes have been made:  no change  Labs/ tests ordered today include: None No orders of the defined types were placed in  this encounter.    Disposition:   FU with with me as needed.     Signed, Lynwood Schilling, MD  02/21/2024 12:17 PM    [facility]

## 2024-02-21 ENCOUNTER — Ambulatory Visit: Attending: Cardiology | Admitting: Cardiology

## 2024-02-21 ENCOUNTER — Encounter: Payer: Self-pay | Admitting: Cardiology

## 2024-02-21 VITALS — BP 120/70 | HR 91 | Ht 61.0 in | Wt 136.0 lb

## 2024-02-21 DIAGNOSIS — R55 Syncope and collapse: Secondary | ICD-10-CM

## 2024-02-21 DIAGNOSIS — I10 Essential (primary) hypertension: Secondary | ICD-10-CM | POA: Diagnosis not present

## 2024-02-21 NOTE — Patient Instructions (Addendum)
 Medication Instructions:  Continue all medications   Lab Work: None ordered  Testing/Procedures: None ordered  Follow-Up: At [facility], you and your health needs are our priority.  As part of our continuing mission to provide you with exceptional heart care, our providers are all part of one team.  This team includes your primary Cardiologist (physician) and Advanced Practice Providers or APPs (Physician Assistants and Nurse Practitioners) who all work together to provide you with the care you need, when you need it.  Your next appointment: As Needed    Provider:  Dr.Hochrein   We recommend signing up for the patient portal called MyChart.  Sign up information is provided on this After Visit Summary.  MyChart is used to connect with patients for Virtual Visits (Telemedicine).  Patients are able to view lab/test results, encounter notes, upcoming appointments, etc.  Non-urgent messages can be sent to your provider as well.   To learn more about what you can do with MyChart, go to [email or web address].

## 2024-02-27 DIAGNOSIS — Z1212 Encounter for screening for malignant neoplasm of rectum: Secondary | ICD-10-CM | POA: Diagnosis not present

## 2024-02-27 DIAGNOSIS — E785 Hyperlipidemia, unspecified: Secondary | ICD-10-CM | POA: Diagnosis not present

## 2024-02-27 DIAGNOSIS — M8580 Other specified disorders of bone density and structure, unspecified site: Secondary | ICD-10-CM | POA: Diagnosis not present

## 2024-02-28 DIAGNOSIS — I10 Essential (primary) hypertension: Secondary | ICD-10-CM | POA: Diagnosis not present

## 2024-02-28 DIAGNOSIS — E785 Hyperlipidemia, unspecified: Secondary | ICD-10-CM | POA: Diagnosis not present

## 2024-02-28 DIAGNOSIS — K219 Gastro-esophageal reflux disease without esophagitis: Secondary | ICD-10-CM | POA: Diagnosis not present

## 2024-03-05 DIAGNOSIS — I10 Essential (primary) hypertension: Secondary | ICD-10-CM | POA: Diagnosis not present

## 2024-03-05 DIAGNOSIS — Z23 Encounter for immunization: Secondary | ICD-10-CM | POA: Diagnosis not present

## 2024-03-05 DIAGNOSIS — M5416 Radiculopathy, lumbar region: Secondary | ICD-10-CM | POA: Diagnosis not present

## 2024-03-05 DIAGNOSIS — M8580 Other specified disorders of bone density and structure, unspecified site: Secondary | ICD-10-CM | POA: Diagnosis not present

## 2024-03-05 DIAGNOSIS — C50919 Malignant neoplasm of unspecified site of unspecified female breast: Secondary | ICD-10-CM | POA: Diagnosis not present

## 2024-03-05 DIAGNOSIS — F419 Anxiety disorder, unspecified: Secondary | ICD-10-CM | POA: Diagnosis not present

## 2024-03-05 DIAGNOSIS — E785 Hyperlipidemia, unspecified: Secondary | ICD-10-CM | POA: Diagnosis not present

## 2024-03-05 DIAGNOSIS — Z0000 Encounter for general adult medical examination without abnormal findings: Secondary | ICD-10-CM | POA: Diagnosis not present

## 2024-03-05 DIAGNOSIS — K589 Irritable bowel syndrome without diarrhea: Secondary | ICD-10-CM | POA: Diagnosis not present

## 2024-04-29 DIAGNOSIS — M4316 Spondylolisthesis, lumbar region: Secondary | ICD-10-CM | POA: Diagnosis not present

## 2024-07-07 ENCOUNTER — Inpatient Hospital Stay: Payer: BC Managed Care – PPO | Admitting: Hematology and Oncology
